# Patient Record
Sex: Female | Born: 1955
Health system: Southern US, Community
[De-identification: ages and names within clinical notes are randomized; demographics above are authoritative.]

## PROBLEM LIST (undated history)

## (undated) DIAGNOSIS — E785 Hyperlipidemia, unspecified: Secondary | ICD-10-CM

## (undated) DIAGNOSIS — F329 Major depressive disorder, single episode, unspecified: Secondary | ICD-10-CM

## (undated) DIAGNOSIS — R0600 Dyspnea, unspecified: Secondary | ICD-10-CM

## (undated) DIAGNOSIS — Z955 Presence of coronary angioplasty implant and graft: Secondary | ICD-10-CM

## (undated) DIAGNOSIS — I10 Essential (primary) hypertension: Secondary | ICD-10-CM

## (undated) DIAGNOSIS — J984 Other disorders of lung: Secondary | ICD-10-CM

## (undated) DIAGNOSIS — F1020 Alcohol dependence, uncomplicated: Secondary | ICD-10-CM

## (undated) DIAGNOSIS — G4700 Insomnia, unspecified: Secondary | ICD-10-CM

## (undated) DIAGNOSIS — C801 Malignant (primary) neoplasm, unspecified: Secondary | ICD-10-CM

## (undated) DIAGNOSIS — F32A Depression, unspecified: Secondary | ICD-10-CM

## (undated) HISTORY — DX: Alcohol dependence, uncomplicated: F10.20

## (undated) HISTORY — DX: Insomnia, unspecified: G47.00

## (undated) HISTORY — PX: CARDIAC CATHETERIZATION: SHX172

## (undated) HISTORY — PX: ORIF DISTAL RADIUS FRACTURE: SUR927

## (undated) HISTORY — DX: Depression, unspecified: F32.A

## (undated) HISTORY — DX: Dyspnea, unspecified: R06.00

## (undated) HISTORY — DX: Major depressive disorder, single episode, unspecified: F32.9

## (undated) HISTORY — DX: Hyperlipidemia, unspecified: E78.5

## (undated) HISTORY — PX: OTHER SURGICAL HISTORY: SHX169

## (undated) HISTORY — PX: MASTECTOMY: SHX3

---

## 1998-10-04 ENCOUNTER — Other Ambulatory Visit: Admission: RE | Admit: 1998-10-04 | Discharge: 1998-10-04 | Payer: Self-pay | Admitting: Obstetrics and Gynecology

## 1998-10-05 ENCOUNTER — Other Ambulatory Visit: Admission: RE | Admit: 1998-10-05 | Discharge: 1998-10-05 | Payer: Self-pay | Admitting: Obstetrics and Gynecology

## 1998-11-02 ENCOUNTER — Other Ambulatory Visit: Admission: RE | Admit: 1998-11-02 | Discharge: 1998-11-02 | Payer: Self-pay | Admitting: Obstetrics and Gynecology

## 1998-11-14 ENCOUNTER — Other Ambulatory Visit: Admission: RE | Admit: 1998-11-14 | Discharge: 1998-11-14 | Payer: Self-pay | Admitting: Obstetrics and Gynecology

## 1998-11-21 ENCOUNTER — Inpatient Hospital Stay (HOSPITAL_COMMUNITY): Admission: RE | Admit: 1998-11-21 | Discharge: 1998-11-23 | Payer: Self-pay | Admitting: Obstetrics and Gynecology

## 1999-10-06 ENCOUNTER — Other Ambulatory Visit: Admission: RE | Admit: 1999-10-06 | Discharge: 1999-10-06 | Payer: Self-pay | Admitting: Obstetrics and Gynecology

## 1999-11-15 ENCOUNTER — Encounter (INDEPENDENT_AMBULATORY_CARE_PROVIDER_SITE_OTHER): Payer: Self-pay | Admitting: *Deleted

## 1999-11-15 ENCOUNTER — Ambulatory Visit (HOSPITAL_COMMUNITY): Admission: RE | Admit: 1999-11-15 | Discharge: 1999-11-15 | Payer: Self-pay | Admitting: Gastroenterology

## 2000-02-07 ENCOUNTER — Ambulatory Visit (HOSPITAL_BASED_OUTPATIENT_CLINIC_OR_DEPARTMENT_OTHER): Admission: RE | Admit: 2000-02-07 | Discharge: 2000-02-07 | Payer: Self-pay | Admitting: Surgery

## 2000-06-18 ENCOUNTER — Ambulatory Visit (HOSPITAL_BASED_OUTPATIENT_CLINIC_OR_DEPARTMENT_OTHER): Admission: RE | Admit: 2000-06-18 | Discharge: 2000-06-18 | Payer: Self-pay | Admitting: Plastic Surgery

## 2000-11-06 ENCOUNTER — Other Ambulatory Visit: Admission: RE | Admit: 2000-11-06 | Discharge: 2000-11-06 | Payer: Self-pay | Admitting: Obstetrics and Gynecology

## 2002-01-27 ENCOUNTER — Other Ambulatory Visit: Admission: RE | Admit: 2002-01-27 | Discharge: 2002-01-27 | Payer: Self-pay | Admitting: Obstetrics and Gynecology

## 2004-02-25 ENCOUNTER — Emergency Department (HOSPITAL_COMMUNITY): Admission: EM | Admit: 2004-02-25 | Discharge: 2004-02-25 | Payer: Self-pay | Admitting: Family Medicine

## 2004-09-29 ENCOUNTER — Other Ambulatory Visit: Admission: RE | Admit: 2004-09-29 | Discharge: 2004-09-29 | Payer: Self-pay | Admitting: Obstetrics and Gynecology

## 2005-09-25 ENCOUNTER — Emergency Department (HOSPITAL_COMMUNITY): Admission: EM | Admit: 2005-09-25 | Discharge: 2005-09-25 | Payer: Self-pay | Admitting: Family Medicine

## 2005-10-17 ENCOUNTER — Other Ambulatory Visit: Admission: RE | Admit: 2005-10-17 | Discharge: 2005-10-17 | Payer: Self-pay | Admitting: Obstetrics and Gynecology

## 2005-12-13 ENCOUNTER — Ambulatory Visit (HOSPITAL_COMMUNITY): Admission: RE | Admit: 2005-12-13 | Discharge: 2005-12-13 | Payer: Self-pay | Admitting: Radiology

## 2005-12-20 ENCOUNTER — Ambulatory Visit: Payer: Self-pay | Admitting: Oncology

## 2005-12-26 ENCOUNTER — Encounter: Admission: RE | Admit: 2005-12-26 | Discharge: 2005-12-26 | Payer: Self-pay | Admitting: Radiology

## 2005-12-27 ENCOUNTER — Ambulatory Visit (HOSPITAL_COMMUNITY): Admission: RE | Admit: 2005-12-27 | Discharge: 2005-12-27 | Payer: Self-pay | Admitting: Oncology

## 2006-01-02 LAB — CBC WITH DIFFERENTIAL/PLATELET
Basophils Absolute: 0 10*3/uL (ref 0.0–0.1)
Eosinophils Absolute: 0.1 10*3/uL (ref 0.0–0.5)
HGB: 14.1 g/dL (ref 11.6–15.9)
MONO#: 0.6 10*3/uL (ref 0.1–0.9)
NEUT#: 3 10*3/uL (ref 1.5–6.5)
RBC: 4.4 10*6/uL (ref 3.70–5.32)
RDW: 13.2 % (ref 11.3–14.5)
WBC: 6.2 10*3/uL (ref 3.9–10.0)

## 2006-01-02 LAB — COMPREHENSIVE METABOLIC PANEL
ALT: 17 U/L (ref 0–40)
Albumin: 4.2 g/dL (ref 3.5–5.2)
CO2: 23 mEq/L (ref 19–32)
Calcium: 8.9 mg/dL (ref 8.4–10.5)
Chloride: 101 mEq/L (ref 96–112)
Glucose, Bld: 90 mg/dL (ref 70–99)
Potassium: 4.1 mEq/L (ref 3.5–5.3)
Sodium: 136 mEq/L (ref 135–145)
Total Bilirubin: 0.2 mg/dL — ABNORMAL LOW (ref 0.3–1.2)
Total Protein: 7.2 g/dL (ref 6.0–8.3)

## 2006-01-02 LAB — CANCER ANTIGEN 27.29: CA 27.29: 87 U/mL — ABNORMAL HIGH (ref 0–39)

## 2006-01-09 ENCOUNTER — Ambulatory Visit (HOSPITAL_COMMUNITY): Admission: RE | Admit: 2006-01-09 | Discharge: 2006-01-09 | Payer: Self-pay | Admitting: Oncology

## 2006-01-10 ENCOUNTER — Ambulatory Visit (HOSPITAL_BASED_OUTPATIENT_CLINIC_OR_DEPARTMENT_OTHER): Admission: RE | Admit: 2006-01-10 | Discharge: 2006-01-10 | Payer: Self-pay | Admitting: Surgery

## 2006-01-15 LAB — CBC WITH DIFFERENTIAL/PLATELET
Basophils Absolute: 0.1 10*3/uL (ref 0.0–0.1)
Eosinophils Absolute: 0.1 10*3/uL (ref 0.0–0.5)
HCT: 41.6 % (ref 34.8–46.6)
HGB: 14.6 g/dL (ref 11.6–15.9)
MCH: 32.2 pg (ref 26.0–34.0)
MCV: 92 fL (ref 81.0–101.0)
MONO%: 11.1 % (ref 0.0–13.0)
NEUT#: 2.6 10*3/uL (ref 1.5–6.5)
NEUT%: 46.2 % (ref 39.6–76.8)
RDW: 11.6 % (ref 11.3–14.5)

## 2006-01-22 LAB — CBC WITH DIFFERENTIAL/PLATELET
Basophils Absolute: 0.6 10*3/uL — ABNORMAL HIGH (ref 0.0–0.1)
EOS%: 0.1 % (ref 0.0–7.0)
Eosinophils Absolute: 0 10*3/uL (ref 0.0–0.5)
HGB: 14.3 g/dL (ref 11.6–15.9)
LYMPH%: 10.5 % — ABNORMAL LOW (ref 14.0–48.0)
MCH: 32 pg (ref 26.0–34.0)
MCV: 92.9 fL (ref 81.0–101.0)
MONO%: 1.6 % (ref 0.0–13.0)
NEUT#: 29.9 10*3/uL — ABNORMAL HIGH (ref 1.5–6.5)
Platelets: 255 10*3/uL (ref 145–400)
RBC: 4.47 10*6/uL (ref 3.70–5.32)

## 2006-02-04 ENCOUNTER — Ambulatory Visit: Payer: Self-pay | Admitting: Oncology

## 2006-02-05 LAB — COMPREHENSIVE METABOLIC PANEL
Albumin: 4.9 g/dL (ref 3.5–5.2)
Alkaline Phosphatase: 65 U/L (ref 39–117)
BUN: 13 mg/dL (ref 6–23)
Calcium: 9.4 mg/dL (ref 8.4–10.5)
Chloride: 104 mEq/L (ref 96–112)
Creatinine, Ser: 0.76 mg/dL (ref 0.40–1.20)
Glucose, Bld: 174 mg/dL — ABNORMAL HIGH (ref 70–99)
Potassium: 4.2 mEq/L (ref 3.5–5.3)

## 2006-02-05 LAB — CBC WITH DIFFERENTIAL/PLATELET
Basophils Absolute: 0 10*3/uL (ref 0.0–0.1)
EOS%: 0 % (ref 0.0–7.0)
Eosinophils Absolute: 0 10*3/uL (ref 0.0–0.5)
HCT: 41.9 % (ref 34.8–46.6)
HGB: 14.6 g/dL (ref 11.6–15.9)
MCH: 32.8 pg (ref 26.0–34.0)
MCV: 93.9 fL (ref 81.0–101.0)
MONO%: 0.3 % (ref 0.0–13.0)
NEUT#: 7.4 10*3/uL — ABNORMAL HIGH (ref 1.5–6.5)
NEUT%: 92.7 % — ABNORMAL HIGH (ref 39.6–76.8)
RDW: 13.1 % (ref 11.3–14.5)

## 2006-02-11 LAB — CBC WITH DIFFERENTIAL/PLATELET
Basophils Absolute: 0 10*3/uL (ref 0.0–0.1)
Eosinophils Absolute: 0 10*3/uL (ref 0.0–0.5)
HGB: 14.7 g/dL (ref 11.6–15.9)
MCV: 93.1 fL (ref 81.0–101.0)
MONO#: 0 10*3/uL — ABNORMAL LOW (ref 0.1–0.9)
MONO%: 1 % (ref 0.0–13.0)
NEUT#: 1.6 10*3/uL (ref 1.5–6.5)
Platelets: 318 10*3/uL (ref 145–400)
RBC: 4.49 10*6/uL (ref 3.70–5.32)
RDW: 13 % (ref 11.3–14.5)
WBC: 3.5 10*3/uL — ABNORMAL LOW (ref 3.9–10.0)

## 2006-02-26 LAB — COMPREHENSIVE METABOLIC PANEL
AST: 12 U/L (ref 0–37)
Albumin: 4.4 g/dL (ref 3.5–5.2)
BUN: 15 mg/dL (ref 6–23)
Calcium: 9.1 mg/dL (ref 8.4–10.5)
Chloride: 104 mEq/L (ref 96–112)
Creatinine, Ser: 0.68 mg/dL (ref 0.40–1.20)
Glucose, Bld: 201 mg/dL — ABNORMAL HIGH (ref 70–99)
Potassium: 4.2 mEq/L (ref 3.5–5.3)

## 2006-02-26 LAB — CBC WITH DIFFERENTIAL/PLATELET
Basophils Absolute: 0 10*3/uL (ref 0.0–0.1)
EOS%: 0.1 % (ref 0.0–7.0)
Eosinophils Absolute: 0 10*3/uL (ref 0.0–0.5)
HCT: 41.5 % (ref 34.8–46.6)
HGB: 14.7 g/dL (ref 11.6–15.9)
MCH: 33.3 pg (ref 26.0–34.0)
MCV: 93.8 fL (ref 81.0–101.0)
NEUT#: 10.8 10*3/uL — ABNORMAL HIGH (ref 1.5–6.5)
NEUT%: 87.7 % — ABNORMAL HIGH (ref 39.6–76.8)
RDW: 14.4 % (ref 11.3–14.5)
lymph#: 1.4 10*3/uL (ref 0.9–3.3)

## 2006-03-05 LAB — CBC WITH DIFFERENTIAL/PLATELET
Basophils Absolute: 0 10*3/uL (ref 0.0–0.1)
EOS%: 0.6 % (ref 0.0–7.0)
Eosinophils Absolute: 0 10*3/uL (ref 0.0–0.5)
HCT: 38.3 % (ref 34.8–46.6)
HGB: 13.6 g/dL (ref 11.6–15.9)
MCH: 33 pg (ref 26.0–34.0)
MONO#: 0.2 10*3/uL (ref 0.1–0.9)
NEUT#: 1.1 10*3/uL — ABNORMAL LOW (ref 1.5–6.5)
NEUT%: 37.7 % — ABNORMAL LOW (ref 39.6–76.8)
RDW: 14.4 % (ref 11.3–14.5)
WBC: 2.8 10*3/uL — ABNORMAL LOW (ref 3.9–10.0)
lymph#: 1.5 10*3/uL (ref 0.9–3.3)

## 2006-03-08 ENCOUNTER — Encounter: Admission: RE | Admit: 2006-03-08 | Discharge: 2006-03-08 | Payer: Self-pay | Admitting: Pediatrics

## 2006-03-12 ENCOUNTER — Ambulatory Visit (HOSPITAL_COMMUNITY): Admission: RE | Admit: 2006-03-12 | Discharge: 2006-03-12 | Payer: Self-pay | Admitting: Oncology

## 2006-03-14 ENCOUNTER — Ambulatory Visit: Payer: Self-pay | Admitting: Oncology

## 2006-03-25 LAB — COMPREHENSIVE METABOLIC PANEL
ALT: 23 U/L (ref 0–40)
AST: 16 U/L (ref 0–37)
CO2: 21 mEq/L (ref 19–32)
Calcium: 9.9 mg/dL (ref 8.4–10.5)
Chloride: 101 mEq/L (ref 96–112)
Potassium: 4.1 mEq/L (ref 3.5–5.3)
Sodium: 139 mEq/L (ref 135–145)
Total Protein: 7.4 g/dL (ref 6.0–8.3)

## 2006-03-25 LAB — CBC WITH DIFFERENTIAL/PLATELET
BASO%: 0.1 % (ref 0.0–2.0)
HCT: 41.9 % (ref 34.8–46.6)
MCHC: 35.9 g/dL (ref 32.0–36.0)
MONO#: 0.1 10*3/uL (ref 0.1–0.9)
RBC: 4.48 10*6/uL (ref 3.70–5.32)
RDW: 14.3 % (ref 11.3–14.5)
WBC: 9.2 10*3/uL (ref 3.9–10.0)
lymph#: 0.8 10*3/uL — ABNORMAL LOW (ref 0.9–3.3)

## 2006-03-25 LAB — CANCER ANTIGEN 27.29: CA 27.29: 74 U/mL — ABNORMAL HIGH (ref 0–39)

## 2006-04-02 LAB — CBC WITH DIFFERENTIAL/PLATELET
Basophils Absolute: 0.2 10*3/uL — ABNORMAL HIGH (ref 0.0–0.1)
EOS%: 0.1 % (ref 0.0–7.0)
Eosinophils Absolute: 0 10*3/uL (ref 0.0–0.5)
HCT: 39 % (ref 34.8–46.6)
HGB: 13.9 g/dL (ref 11.6–15.9)
LYMPH%: 18.5 % (ref 14.0–48.0)
MCH: 33.8 pg (ref 26.0–34.0)
MCV: 94.5 fL (ref 81.0–101.0)
MONO%: 10.1 % (ref 0.0–13.0)
NEUT#: 13.2 10*3/uL — ABNORMAL HIGH (ref 1.5–6.5)
NEUT%: 70.3 % (ref 39.6–76.8)
Platelets: 236 10*3/uL (ref 145–400)

## 2006-04-13 ENCOUNTER — Ambulatory Visit: Payer: Self-pay | Admitting: Oncology

## 2006-04-16 LAB — CBC WITH DIFFERENTIAL/PLATELET
EOS%: 0.1 % (ref 0.0–7.0)
Eosinophils Absolute: 0 10*3/uL (ref 0.0–0.5)
LYMPH%: 9 % — ABNORMAL LOW (ref 14.0–48.0)
MCH: 34.4 pg — ABNORMAL HIGH (ref 26.0–34.0)
MCV: 96.6 fL (ref 81.0–101.0)
MONO%: 1.3 % (ref 0.0–13.0)
Platelets: 369 10*3/uL (ref 145–400)
RBC: 3.88 10*6/uL (ref 3.70–5.32)
RDW: 15.2 % — ABNORMAL HIGH (ref 11.3–14.5)

## 2006-04-16 LAB — COMPREHENSIVE METABOLIC PANEL
AST: 15 U/L (ref 0–37)
Albumin: 4.7 g/dL (ref 3.5–5.2)
Alkaline Phosphatase: 75 U/L (ref 39–117)
BUN: 9 mg/dL (ref 6–23)
Glucose, Bld: 183 mg/dL — ABNORMAL HIGH (ref 70–99)
Potassium: 4.2 mEq/L (ref 3.5–5.3)
Sodium: 140 mEq/L (ref 135–145)
Total Bilirubin: 0.4 mg/dL (ref 0.3–1.2)
Total Protein: 7.2 g/dL (ref 6.0–8.3)

## 2006-04-16 LAB — LACTATE DEHYDROGENASE: LDH: 176 U/L (ref 94–250)

## 2006-04-23 LAB — CBC WITH DIFFERENTIAL/PLATELET
Basophils Absolute: 0 10*3/uL (ref 0.0–0.1)
EOS%: 0.2 % (ref 0.0–7.0)
Eosinophils Absolute: 0 10*3/uL (ref 0.0–0.5)
HCT: 37 % (ref 34.8–46.6)
HGB: 12.8 g/dL (ref 11.6–15.9)
MCH: 34.1 pg — ABNORMAL HIGH (ref 26.0–34.0)
MONO#: 0.5 10*3/uL (ref 0.1–0.9)
NEUT#: 1.4 10*3/uL — ABNORMAL LOW (ref 1.5–6.5)
NEUT%: 37.4 % — ABNORMAL LOW (ref 39.6–76.8)
RDW: 15.6 % — ABNORMAL HIGH (ref 11.3–14.5)
lymph#: 1.8 10*3/uL (ref 0.9–3.3)

## 2006-05-07 LAB — COMPREHENSIVE METABOLIC PANEL
AST: 16 U/L (ref 0–37)
Albumin: 4.2 g/dL (ref 3.5–5.2)
Alkaline Phosphatase: 51 U/L (ref 39–117)
BUN: 12 mg/dL (ref 6–23)
Calcium: 8.9 mg/dL (ref 8.4–10.5)
Creatinine, Ser: 0.68 mg/dL (ref 0.40–1.20)
Glucose, Bld: 120 mg/dL — ABNORMAL HIGH (ref 70–99)
Potassium: 3.8 mEq/L (ref 3.5–5.3)

## 2006-05-07 LAB — CBC WITH DIFFERENTIAL/PLATELET
Basophils Absolute: 0 10*3/uL (ref 0.0–0.1)
EOS%: 0.1 % (ref 0.0–7.0)
Eosinophils Absolute: 0 10*3/uL (ref 0.0–0.5)
HCT: 35.1 % (ref 34.8–46.6)
HGB: 12.2 g/dL (ref 11.6–15.9)
MCH: 34.6 pg — ABNORMAL HIGH (ref 26.0–34.0)
MCV: 99.8 fL (ref 81.0–101.0)
MONO%: 9.6 % (ref 0.0–13.0)
NEUT#: 3.6 10*3/uL (ref 1.5–6.5)
NEUT%: 67.7 % (ref 39.6–76.8)
Platelets: 265 10*3/uL (ref 145–400)

## 2006-05-14 ENCOUNTER — Encounter: Admission: RE | Admit: 2006-05-14 | Discharge: 2006-05-14 | Payer: Self-pay | Admitting: Oncology

## 2006-05-16 ENCOUNTER — Ambulatory Visit (HOSPITAL_COMMUNITY): Admission: RE | Admit: 2006-05-16 | Discharge: 2006-05-16 | Payer: Self-pay | Admitting: Oncology

## 2006-05-23 LAB — CBC WITH DIFFERENTIAL/PLATELET
BASO%: 0.4 % (ref 0.0–2.0)
HCT: 36.1 % (ref 34.8–46.6)
MCHC: 34.5 g/dL (ref 32.0–36.0)
MONO#: 0.8 10*3/uL (ref 0.1–0.9)
NEUT%: 57.5 % (ref 39.6–76.8)
RBC: 3.51 10*6/uL — ABNORMAL LOW (ref 3.70–5.32)
RDW: 15.9 % — ABNORMAL HIGH (ref 11.3–14.5)
WBC: 5.6 10*3/uL (ref 3.9–10.0)
lymph#: 1.6 10*3/uL (ref 0.9–3.3)

## 2006-05-31 ENCOUNTER — Ambulatory Visit: Payer: Self-pay | Admitting: Oncology

## 2006-06-04 LAB — COMPREHENSIVE METABOLIC PANEL
ALT: 25 U/L (ref 0–40)
AST: 14 U/L (ref 0–37)
Alkaline Phosphatase: 46 U/L (ref 39–117)
Sodium: 138 mEq/L (ref 135–145)
Total Bilirubin: 0.5 mg/dL (ref 0.3–1.2)
Total Protein: 6.8 g/dL (ref 6.0–8.3)

## 2006-06-04 LAB — CBC WITH DIFFERENTIAL/PLATELET
Eosinophils Absolute: 0 10*3/uL (ref 0.0–0.5)
LYMPH%: 5.9 % — ABNORMAL LOW (ref 14.0–48.0)
MCHC: 35.1 g/dL (ref 32.0–36.0)
MCV: 99.8 fL (ref 81.0–101.0)
MONO%: 1.1 % (ref 0.0–13.0)
NEUT#: 10.5 10*3/uL — ABNORMAL HIGH (ref 1.5–6.5)
Platelets: 309 10*3/uL (ref 145–400)
RBC: 4.19 10*6/uL (ref 3.70–5.32)

## 2006-06-11 LAB — CBC WITH DIFFERENTIAL/PLATELET
BASO%: 0.9 % (ref 0.0–2.0)
EOS%: 1.3 % (ref 0.0–7.0)
LYMPH%: 55.4 % — ABNORMAL HIGH (ref 14.0–48.0)
MCHC: 35.5 g/dL (ref 32.0–36.0)
MONO#: 0.1 10*3/uL (ref 0.1–0.9)
MONO%: 4 % (ref 0.0–13.0)
Platelets: 188 10*3/uL (ref 145–400)
RBC: 3.83 10*6/uL (ref 3.70–5.32)
WBC: 2.8 10*3/uL — ABNORMAL LOW (ref 3.9–10.0)

## 2006-06-13 ENCOUNTER — Ambulatory Visit: Payer: Self-pay | Admitting: Pulmonary Disease

## 2006-06-18 LAB — CBC WITH DIFFERENTIAL/PLATELET
BASO%: 1.2 % (ref 0.0–2.0)
EOS%: 0.5 % (ref 0.0–7.0)
HCT: 32.6 % — ABNORMAL LOW (ref 34.8–46.6)
MCH: 35.8 pg — ABNORMAL HIGH (ref 26.0–34.0)
MCHC: 36.1 g/dL — ABNORMAL HIGH (ref 32.0–36.0)
MONO#: 0.1 10*3/uL (ref 0.1–0.9)
NEUT%: 25.2 % — ABNORMAL LOW (ref 39.6–76.8)
RBC: 3.29 10*6/uL — ABNORMAL LOW (ref 3.70–5.32)
RDW: 12 % (ref 11.3–14.5)
WBC: 1.7 10*3/uL — ABNORMAL LOW (ref 3.9–10.0)
lymph#: 1.1 10*3/uL (ref 0.9–3.3)

## 2006-06-21 ENCOUNTER — Encounter: Payer: Self-pay | Admitting: Pulmonary Disease

## 2006-06-21 ENCOUNTER — Ambulatory Visit (HOSPITAL_COMMUNITY): Admission: RE | Admit: 2006-06-21 | Discharge: 2006-06-21 | Payer: Self-pay | Admitting: Pulmonary Disease

## 2006-06-21 ENCOUNTER — Ambulatory Visit: Admission: RE | Admit: 2006-06-21 | Discharge: 2006-06-21 | Payer: Self-pay | Admitting: Pulmonary Disease

## 2006-06-25 LAB — COMPREHENSIVE METABOLIC PANEL
ALT: 48 U/L — ABNORMAL HIGH (ref 0–35)
AST: 33 U/L (ref 0–37)
Albumin: 4.2 g/dL (ref 3.5–5.2)
BUN: 6 mg/dL (ref 6–23)
CO2: 25 mEq/L (ref 19–32)
Calcium: 9.3 mg/dL (ref 8.4–10.5)
Chloride: 106 mEq/L (ref 96–112)
Potassium: 3.7 mEq/L (ref 3.5–5.3)

## 2006-06-25 LAB — CBC WITH DIFFERENTIAL/PLATELET
BASO%: 1.3 % (ref 0.0–2.0)
Basophils Absolute: 0.9 10*3/uL — ABNORMAL HIGH (ref 0.0–0.1)
EOS%: 0 % (ref 0.0–7.0)
HCT: 36.5 % (ref 34.8–46.6)
HGB: 12.6 g/dL (ref 11.6–15.9)
MCH: 34.9 pg — ABNORMAL HIGH (ref 26.0–34.0)
MONO#: 4.8 10*3/uL — ABNORMAL HIGH (ref 0.1–0.9)
NEUT#: 57.1 10*3/uL — ABNORMAL HIGH (ref 1.5–6.5)
NEUT%: 84.6 % — ABNORMAL HIGH (ref 39.6–76.8)
RDW: 15.1 % — ABNORMAL HIGH (ref 11.3–14.5)
WBC: 67.5 10*3/uL (ref 3.9–10.0)
lymph#: 4.7 10*3/uL — ABNORMAL HIGH (ref 0.9–3.3)

## 2006-06-25 LAB — CANCER ANTIGEN 27.29: CA 27.29: 60 U/mL — ABNORMAL HIGH (ref 0–39)

## 2006-06-27 ENCOUNTER — Ambulatory Visit (HOSPITAL_COMMUNITY): Admission: RE | Admit: 2006-06-27 | Discharge: 2006-06-27 | Payer: Self-pay | Admitting: Oncology

## 2006-06-27 ENCOUNTER — Ambulatory Visit: Payer: Self-pay | Admitting: Pulmonary Disease

## 2006-06-27 LAB — CBC WITH DIFFERENTIAL/PLATELET
BASO%: 0.3 % (ref 0.0–2.0)
EOS%: 0 % (ref 0.0–7.0)
HCT: 35.8 % (ref 34.8–46.6)
LYMPH%: 29 % (ref 14.0–48.0)
MCH: 35.6 pg — ABNORMAL HIGH (ref 26.0–34.0)
MCHC: 34.4 g/dL (ref 32.0–36.0)
MCV: 103.6 fL — ABNORMAL HIGH (ref 81.0–101.0)
MONO#: 0.3 10*3/uL (ref 0.1–0.9)
MONO%: 9.7 % (ref 0.0–13.0)
NEUT%: 61 % (ref 39.6–76.8)
Platelets: 366 10*3/uL (ref 145–400)
RBC: 3.45 10*6/uL — ABNORMAL LOW (ref 3.70–5.32)

## 2006-06-27 LAB — URINALYSIS, MICROSCOPIC - CHCC
Glucose: NEGATIVE g/dL
Leukocyte Esterase: NEGATIVE
Nitrite: NEGATIVE
Protein: NEGATIVE mg/dL
Specific Gravity, Urine: 1.02 (ref 1.003–1.035)

## 2006-06-29 LAB — URINE CULTURE

## 2006-07-02 LAB — CBC WITH DIFFERENTIAL/PLATELET
BASO%: 0.9 % (ref 0.0–2.0)
Basophils Absolute: 0 10*3/uL (ref 0.0–0.1)
EOS%: 0.1 % (ref 0.0–7.0)
Eosinophils Absolute: 0 10*3/uL (ref 0.0–0.5)
HCT: 37.2 % (ref 34.8–46.6)
HGB: 13.2 g/dL (ref 11.6–15.9)
LYMPH%: 62.4 % — ABNORMAL HIGH (ref 14.0–48.0)
MCH: 35.1 pg — ABNORMAL HIGH (ref 26.0–34.0)
MCHC: 35.5 g/dL (ref 32.0–36.0)
MCV: 99 fL (ref 81.0–101.0)
MONO#: 0.4 10*3/uL (ref 0.1–0.9)
MONO%: 13.9 % — ABNORMAL HIGH (ref 0.0–13.0)
NEUT#: 0.6 10*3/uL — ABNORMAL LOW (ref 1.5–6.5)
NEUT%: 22.7 % — ABNORMAL LOW (ref 39.6–76.8)
Platelets: 171 10*3/uL (ref 145–400)
RBC: 3.76 10*6/uL (ref 3.70–5.32)
RDW: 12.7 % (ref 11.3–14.5)
WBC: 2.7 10*3/uL — ABNORMAL LOW (ref 3.9–10.0)
lymph#: 1.7 10*3/uL (ref 0.9–3.3)

## 2006-07-09 LAB — CBC WITH DIFFERENTIAL/PLATELET
Basophils Absolute: 0 10*3/uL (ref 0.0–0.1)
Eosinophils Absolute: 0 10*3/uL (ref 0.0–0.5)
HGB: 14.4 g/dL (ref 11.6–15.9)
LYMPH%: 10.7 % — ABNORMAL LOW (ref 14.0–48.0)
MCV: 101 fL (ref 81.0–101.0)
MONO%: 3.4 % (ref 0.0–13.0)
NEUT#: 4.1 10*3/uL (ref 1.5–6.5)
Platelets: 390 10*3/uL (ref 145–400)
RDW: 13.8 % (ref 11.3–14.5)

## 2006-07-09 LAB — COMPREHENSIVE METABOLIC PANEL
Albumin: 4.5 g/dL (ref 3.5–5.2)
Alkaline Phosphatase: 71 U/L (ref 39–117)
BUN: 12 mg/dL (ref 6–23)
CO2: 21 mEq/L (ref 19–32)
Glucose, Bld: 139 mg/dL — ABNORMAL HIGH (ref 70–99)
Potassium: 4.1 mEq/L (ref 3.5–5.3)

## 2006-07-09 LAB — CANCER ANTIGEN 27.29: CA 27.29: 73 U/mL — ABNORMAL HIGH (ref 0–39)

## 2006-07-09 LAB — LACTATE DEHYDROGENASE: LDH: 181 U/L (ref 94–250)

## 2006-07-16 ENCOUNTER — Ambulatory Visit: Payer: Self-pay | Admitting: Oncology

## 2006-07-16 LAB — CBC WITH DIFFERENTIAL/PLATELET
Basophils Absolute: 0.1 10*3/uL (ref 0.0–0.1)
Eosinophils Absolute: 0.1 10*3/uL (ref 0.0–0.5)
HCT: 37.7 % (ref 34.8–46.6)
HGB: 13 g/dL (ref 11.6–15.9)
LYMPH%: 12.3 % — ABNORMAL LOW (ref 14.0–48.0)
MCHC: 34.5 g/dL (ref 32.0–36.0)
MONO#: 1.4 10*3/uL — ABNORMAL HIGH (ref 0.1–0.9)
NEUT#: 28.1 10*3/uL — ABNORMAL HIGH (ref 1.5–6.5)
NEUT%: 82.8 % — ABNORMAL HIGH (ref 39.6–76.8)
Platelets: 295 10*3/uL (ref 145–400)
WBC: 33.9 10*3/uL — ABNORMAL HIGH (ref 3.9–10.0)
lymph#: 4.2 10*3/uL — ABNORMAL HIGH (ref 0.9–3.3)

## 2006-07-23 LAB — CBC WITH DIFFERENTIAL/PLATELET
BASO%: 0.3 % (ref 0.0–2.0)
Basophils Absolute: 0.1 10*3/uL (ref 0.0–0.1)
EOS%: 0.2 % (ref 0.0–7.0)
HCT: 34.9 % (ref 34.8–46.6)
HGB: 12.3 g/dL (ref 11.6–15.9)
LYMPH%: 8.2 % — ABNORMAL LOW (ref 14.0–48.0)
MCH: 35.6 pg — ABNORMAL HIGH (ref 26.0–34.0)
MCHC: 35.1 g/dL (ref 32.0–36.0)
MCV: 101 fL (ref 81.0–101.0)
NEUT%: 87.1 % — ABNORMAL HIGH (ref 39.6–76.8)
Platelets: 100 10*3/uL — ABNORMAL LOW (ref 145–400)
lymph#: 2.5 10*3/uL (ref 0.9–3.3)

## 2006-08-07 LAB — CBC WITH DIFFERENTIAL/PLATELET
BASO%: 1.3 % (ref 0.0–2.0)
Basophils Absolute: 0.1 10*3/uL (ref 0.0–0.1)
EOS%: 0.8 % (ref 0.0–7.0)
HGB: 14.2 g/dL (ref 11.6–15.9)
MCH: 34.9 pg — ABNORMAL HIGH (ref 26.0–34.0)
MCHC: 35 g/dL (ref 32.0–36.0)
MCV: 99.7 fL (ref 81.0–101.0)
MONO%: 14 % — ABNORMAL HIGH (ref 0.0–13.0)
NEUT%: 45.2 % (ref 39.6–76.8)
RDW: 12.2 % (ref 11.3–14.5)
lymph#: 2.3 10*3/uL (ref 0.9–3.3)

## 2006-08-07 LAB — COMPREHENSIVE METABOLIC PANEL
ALT: 39 U/L — ABNORMAL HIGH (ref 0–35)
AST: 25 U/L (ref 0–37)
Alkaline Phosphatase: 71 U/L (ref 39–117)
BUN: 11 mg/dL (ref 6–23)
Creatinine, Ser: 0.7 mg/dL (ref 0.40–1.20)

## 2006-08-16 LAB — CBC WITH DIFFERENTIAL/PLATELET
BASO%: 0.4 % (ref 0.0–2.0)
EOS%: 0.4 % (ref 0.0–7.0)
Eosinophils Absolute: 0.1 10*3/uL (ref 0.0–0.5)
MCV: 98.1 fL (ref 81.0–101.0)
MONO%: 8.5 % (ref 0.0–13.0)
NEUT#: 27.5 10*3/uL — ABNORMAL HIGH (ref 1.5–6.5)
RBC: 3.87 10*6/uL (ref 3.70–5.32)
RDW: 12.3 % (ref 11.3–14.5)

## 2006-08-23 ENCOUNTER — Ambulatory Visit: Payer: Self-pay | Admitting: Oncology

## 2006-08-23 LAB — COMPREHENSIVE METABOLIC PANEL
ALT: 114 U/L — ABNORMAL HIGH (ref 0–35)
Albumin: 3.8 g/dL (ref 3.5–5.2)
CO2: 26 mEq/L (ref 19–32)
Chloride: 103 mEq/L (ref 96–112)
Glucose, Bld: 128 mg/dL — ABNORMAL HIGH (ref 70–99)
Potassium: 3.3 mEq/L — ABNORMAL LOW (ref 3.5–5.3)
Sodium: 137 mEq/L (ref 135–145)
Total Protein: 6.1 g/dL (ref 6.0–8.3)

## 2006-08-23 LAB — CBC WITH DIFFERENTIAL/PLATELET
Eosinophils Absolute: 0.1 10*3/uL (ref 0.0–0.5)
MONO#: 1 10*3/uL — ABNORMAL HIGH (ref 0.1–0.9)
NEUT#: 18.9 10*3/uL — ABNORMAL HIGH (ref 1.5–6.5)
Platelets: 103 10*3/uL — ABNORMAL LOW (ref 145–400)
RBC: 3.56 10*6/uL — ABNORMAL LOW (ref 3.70–5.32)
RDW: 11.7 % (ref 11.3–14.5)
WBC: 23.5 10*3/uL — ABNORMAL HIGH (ref 3.9–10.0)
lymph#: 3.4 10*3/uL — ABNORMAL HIGH (ref 0.9–3.3)

## 2006-08-23 LAB — LACTATE DEHYDROGENASE: LDH: 256 U/L — ABNORMAL HIGH (ref 94–250)

## 2006-08-30 ENCOUNTER — Ambulatory Visit (HOSPITAL_COMMUNITY): Admission: RE | Admit: 2006-08-30 | Discharge: 2006-08-30 | Payer: Self-pay | Admitting: Oncology

## 2006-08-30 LAB — COMPREHENSIVE METABOLIC PANEL
ALT: 144 U/L — ABNORMAL HIGH (ref 0–35)
Albumin: 4.1 g/dL (ref 3.5–5.2)
Alkaline Phosphatase: 71 U/L (ref 39–117)
Glucose, Bld: 93 mg/dL (ref 70–99)
Potassium: 3.7 mEq/L (ref 3.5–5.3)
Sodium: 141 mEq/L (ref 135–145)
Total Bilirubin: 0.4 mg/dL (ref 0.3–1.2)
Total Protein: 6.2 g/dL (ref 6.0–8.3)

## 2006-08-30 LAB — CBC WITH DIFFERENTIAL/PLATELET
Basophils Absolute: 0 10*3/uL (ref 0.0–0.1)
EOS%: 1.3 % (ref 0.0–7.0)
Eosinophils Absolute: 0 10*3/uL (ref 0.0–0.5)
HGB: 12.3 g/dL (ref 11.6–15.9)
MCH: 34.9 pg — ABNORMAL HIGH (ref 26.0–34.0)
NEUT#: 1.1 10*3/uL — ABNORMAL LOW (ref 1.5–6.5)
RBC: 3.54 10*6/uL — ABNORMAL LOW (ref 3.70–5.32)
RDW: 12.4 % (ref 11.3–14.5)
lymph#: 1 10*3/uL (ref 0.9–3.3)

## 2006-08-30 LAB — CANCER ANTIGEN 27.29: CA 27.29: 53 U/mL — ABNORMAL HIGH (ref 0–39)

## 2006-09-06 ENCOUNTER — Ambulatory Visit: Admission: RE | Admit: 2006-09-06 | Discharge: 2006-12-05 | Payer: Self-pay | Admitting: Radiation Oncology

## 2006-09-16 LAB — FSH/LH: FSH: 58 m[IU]/mL

## 2006-09-16 LAB — CMP AND LIVER
ALT: 50 U/L — ABNORMAL HIGH (ref 0–35)
Alkaline Phosphatase: 60 U/L (ref 39–117)
Bilirubin, Direct: 0.1 mg/dL (ref 0.0–0.3)
CO2: 28 mEq/L (ref 19–32)
Creatinine, Ser: 0.66 mg/dL (ref 0.40–1.20)
Indirect Bilirubin: 0.3 mg/dL (ref 0.0–0.9)
Total Bilirubin: 0.4 mg/dL (ref 0.3–1.2)

## 2006-09-16 LAB — CBC WITH DIFFERENTIAL/PLATELET
BASO%: 0.5 % (ref 0.0–2.0)
EOS%: 0.3 % (ref 0.0–7.0)
HCT: 42.2 % (ref 34.8–46.6)
LYMPH%: 28.8 % (ref 14.0–48.0)
MCH: 34.8 pg — ABNORMAL HIGH (ref 26.0–34.0)
MCHC: 35.2 g/dL (ref 32.0–36.0)
MCV: 99 fL (ref 81.0–101.0)
MONO%: 15.1 % — ABNORMAL HIGH (ref 0.0–13.0)
NEUT%: 55.3 % (ref 39.6–76.8)
Platelets: 337 10*3/uL (ref 145–400)
RBC: 4.27 10*6/uL (ref 3.70–5.32)
WBC: 5.5 10*3/uL (ref 3.9–10.0)

## 2006-09-24 ENCOUNTER — Ambulatory Visit: Payer: Self-pay | Admitting: Pulmonary Disease

## 2006-09-30 LAB — ESTRADIOL, ULTRA SENS: Estradiol, Ultra Sensitive: <2 pg/mL

## 2006-10-21 ENCOUNTER — Inpatient Hospital Stay (HOSPITAL_COMMUNITY): Admission: RE | Admit: 2006-10-21 | Discharge: 2006-10-22 | Payer: Self-pay | Admitting: Surgery

## 2006-10-21 ENCOUNTER — Encounter (INDEPENDENT_AMBULATORY_CARE_PROVIDER_SITE_OTHER): Payer: Self-pay | Admitting: Specialist

## 2006-11-21 ENCOUNTER — Ambulatory Visit: Payer: Self-pay | Admitting: Oncology

## 2006-11-26 LAB — CANCER ANTIGEN 27.29: CA 27.29: 40 U/mL — ABNORMAL HIGH (ref 0–39)

## 2006-11-26 LAB — CBC WITH DIFFERENTIAL/PLATELET
Basophils Absolute: 0 10*3/uL (ref 0.0–0.1)
Eosinophils Absolute: 0 10*3/uL (ref 0.0–0.5)
HGB: 14.5 g/dL (ref 11.6–15.9)
MCV: 92.9 fL (ref 81.0–101.0)
NEUT#: 3.6 10*3/uL (ref 1.5–6.5)
RDW: 13.1 % (ref 11.3–14.5)
lymph#: 1.1 10*3/uL (ref 0.9–3.3)

## 2006-11-26 LAB — COMPREHENSIVE METABOLIC PANEL
Albumin: 4.2 g/dL (ref 3.5–5.2)
BUN: 8 mg/dL (ref 6–23)
Calcium: 9.4 mg/dL (ref 8.4–10.5)
Chloride: 102 mEq/L (ref 96–112)
Glucose, Bld: 153 mg/dL — ABNORMAL HIGH (ref 70–99)
Potassium: 4 mEq/L (ref 3.5–5.3)

## 2006-12-02 ENCOUNTER — Encounter: Payer: Self-pay | Admitting: Pulmonary Disease

## 2006-12-02 ENCOUNTER — Ambulatory Visit: Payer: Self-pay | Admitting: Pulmonary Disease

## 2006-12-06 ENCOUNTER — Ambulatory Visit: Admission: RE | Admit: 2006-12-06 | Discharge: 2007-03-05 | Payer: Self-pay | Admitting: Radiation Oncology

## 2007-01-16 ENCOUNTER — Ambulatory Visit: Payer: Self-pay | Admitting: Oncology

## 2007-02-24 LAB — CBC WITH DIFFERENTIAL/PLATELET
BASO%: 0.4 % (ref 0.0–2.0)
Eosinophils Absolute: 0.2 10*3/uL (ref 0.0–0.5)
HCT: 37.9 % (ref 34.8–46.6)
MCHC: 35.2 g/dL (ref 32.0–36.0)
MONO#: 0.6 10*3/uL (ref 0.1–0.9)
NEUT#: 2.4 10*3/uL (ref 1.5–6.5)
Platelets: 227 10*3/uL (ref 145–400)
RBC: 4.06 10*6/uL (ref 3.70–5.32)
WBC: 4.2 10*3/uL (ref 3.9–10.0)
lymph#: 1 10*3/uL (ref 0.9–3.3)

## 2007-02-24 LAB — COMPREHENSIVE METABOLIC PANEL
ALT: 29 U/L (ref 0–35)
Albumin: 3.8 g/dL (ref 3.5–5.2)
CO2: 27 mEq/L (ref 19–32)
Calcium: 9.1 mg/dL (ref 8.4–10.5)
Chloride: 103 mEq/L (ref 96–112)
Glucose, Bld: 103 mg/dL — ABNORMAL HIGH (ref 70–99)
Sodium: 140 mEq/L (ref 135–145)
Total Protein: 6.5 g/dL (ref 6.0–8.3)

## 2007-02-24 LAB — CANCER ANTIGEN 27.29: CA 27.29: 34 U/mL (ref 0–39)

## 2007-02-24 LAB — LACTATE DEHYDROGENASE: LDH: 131 U/L (ref 94–250)

## 2007-02-27 LAB — WOUND CULTURE

## 2007-03-10 ENCOUNTER — Ambulatory Visit (HOSPITAL_COMMUNITY): Admission: RE | Admit: 2007-03-10 | Discharge: 2007-03-10 | Payer: Self-pay | Admitting: Oncology

## 2007-03-20 ENCOUNTER — Ambulatory Visit: Payer: Self-pay | Admitting: Oncology

## 2007-03-20 ENCOUNTER — Ambulatory Visit (HOSPITAL_COMMUNITY): Admission: RE | Admit: 2007-03-20 | Discharge: 2007-03-20 | Payer: Self-pay | Admitting: Oncology

## 2007-03-20 LAB — CBC WITH DIFFERENTIAL/PLATELET
Basophils Absolute: 0 10*3/uL (ref 0.0–0.1)
EOS%: 0.4 % (ref 0.0–7.0)
Eosinophils Absolute: 0 10*3/uL (ref 0.0–0.5)
HCT: 42.2 % (ref 34.8–46.6)
HGB: 14.9 g/dL (ref 11.6–15.9)
LYMPH%: 21.2 % (ref 14.0–48.0)
MCH: 33 pg (ref 26.0–34.0)
MCV: 93.3 fL (ref 81.0–101.0)
MONO%: 8.2 % (ref 0.0–13.0)
NEUT#: 5 10*3/uL (ref 1.5–6.5)
NEUT%: 69.9 % (ref 39.6–76.8)
Platelets: 264 10*3/uL (ref 145–400)

## 2007-06-03 ENCOUNTER — Ambulatory Visit: Payer: Self-pay | Admitting: Oncology

## 2007-06-04 LAB — CBC WITH DIFFERENTIAL/PLATELET
BASO%: 0.4 % (ref 0.0–2.0)
EOS%: 0.9 % (ref 0.0–7.0)
HCT: 42.8 % (ref 34.8–46.6)
LYMPH%: 20.6 % (ref 14.0–48.0)
MCH: 34.8 pg — ABNORMAL HIGH (ref 26.0–34.0)
MCHC: 35.7 g/dL (ref 32.0–36.0)
MONO#: 0.5 10*3/uL (ref 0.1–0.9)
NEUT%: 69.6 % (ref 39.6–76.8)
RBC: 4.39 10*6/uL (ref 3.70–5.32)
WBC: 5.8 10*3/uL (ref 3.9–10.0)
lymph#: 1.2 10*3/uL (ref 0.9–3.3)

## 2007-06-05 LAB — COMPREHENSIVE METABOLIC PANEL
AST: 107 U/L — ABNORMAL HIGH (ref 0–37)
Alkaline Phosphatase: 89 U/L (ref 39–117)
BUN: 14 mg/dL (ref 6–23)
Glucose, Bld: 104 mg/dL — ABNORMAL HIGH (ref 70–99)
Total Bilirubin: 0.6 mg/dL (ref 0.3–1.2)

## 2007-06-05 LAB — CANCER ANTIGEN 27.29: CA 27.29: 36 U/mL (ref 0–39)

## 2007-06-12 LAB — HEPATIC FUNCTION PANEL
ALT: 132 U/L — ABNORMAL HIGH (ref 0–35)
AST: 83 U/L — ABNORMAL HIGH (ref 0–37)
Albumin: 4.1 g/dL (ref 3.5–5.2)
Alkaline Phosphatase: 77 U/L (ref 39–117)

## 2007-06-25 ENCOUNTER — Ambulatory Visit (HOSPITAL_COMMUNITY): Admission: RE | Admit: 2007-06-25 | Discharge: 2007-06-25 | Payer: Self-pay | Admitting: Oncology

## 2007-06-30 ENCOUNTER — Ambulatory Visit: Payer: Self-pay | Admitting: Pulmonary Disease

## 2007-07-16 ENCOUNTER — Ambulatory Visit: Payer: Self-pay | Admitting: Oncology

## 2007-08-08 ENCOUNTER — Telehealth (INDEPENDENT_AMBULATORY_CARE_PROVIDER_SITE_OTHER): Payer: Self-pay | Admitting: *Deleted

## 2007-08-08 ENCOUNTER — Ambulatory Visit: Payer: Self-pay | Admitting: Pulmonary Disease

## 2007-08-15 ENCOUNTER — Telehealth (INDEPENDENT_AMBULATORY_CARE_PROVIDER_SITE_OTHER): Payer: Self-pay | Admitting: *Deleted

## 2007-08-15 DIAGNOSIS — R0602 Shortness of breath: Secondary | ICD-10-CM | POA: Insufficient documentation

## 2007-08-18 ENCOUNTER — Telehealth (INDEPENDENT_AMBULATORY_CARE_PROVIDER_SITE_OTHER): Payer: Self-pay | Admitting: *Deleted

## 2007-08-18 ENCOUNTER — Encounter: Payer: Self-pay | Admitting: Adult Health

## 2007-08-21 ENCOUNTER — Ambulatory Visit: Payer: Self-pay | Admitting: Pulmonary Disease

## 2007-08-27 ENCOUNTER — Ambulatory Visit: Payer: Self-pay | Admitting: Oncology

## 2007-09-01 LAB — CBC WITH DIFFERENTIAL/PLATELET
BASO%: 0.1 % (ref 0.0–2.0)
Basophils Absolute: 0 10*3/uL (ref 0.0–0.1)
Eosinophils Absolute: 0.1 10*3/uL (ref 0.0–0.5)
HCT: 42.7 % (ref 34.8–46.6)
HGB: 15 g/dL (ref 11.6–15.9)
MONO#: 0.7 10*3/uL (ref 0.1–0.9)
NEUT#: 3.9 10*3/uL (ref 1.5–6.5)
NEUT%: 66 % (ref 39.6–76.8)
Platelets: 265 10*3/uL (ref 145–400)
WBC: 6 10*3/uL (ref 3.9–10.0)
lymph#: 1.3 10*3/uL (ref 0.9–3.3)

## 2007-09-01 LAB — COMPREHENSIVE METABOLIC PANEL
ALT: 43 U/L — ABNORMAL HIGH (ref 0–35)
BUN: 13 mg/dL (ref 6–23)
CO2: 28 mEq/L (ref 19–32)
Calcium: 9.4 mg/dL (ref 8.4–10.5)
Chloride: 106 mEq/L (ref 96–112)
Creatinine, Ser: 0.78 mg/dL (ref 0.40–1.20)
Glucose, Bld: 86 mg/dL (ref 70–99)

## 2007-09-01 LAB — LACTATE DEHYDROGENASE: LDH: 131 U/L (ref 94–250)

## 2007-10-29 ENCOUNTER — Ambulatory Visit: Payer: Self-pay | Admitting: Oncology

## 2007-11-03 ENCOUNTER — Ambulatory Visit (HOSPITAL_COMMUNITY): Admission: RE | Admit: 2007-11-03 | Discharge: 2007-11-03 | Payer: Self-pay | Admitting: Oncology

## 2007-11-03 LAB — CBC WITH DIFFERENTIAL/PLATELET
BASO%: 0.5 % (ref 0.0–2.0)
Basophils Absolute: 0 10*3/uL (ref 0.0–0.1)
Eosinophils Absolute: 0.1 10*3/uL (ref 0.0–0.5)
HCT: 41.1 % (ref 34.8–46.6)
HGB: 14.7 g/dL (ref 11.6–15.9)
LYMPH%: 27.6 % (ref 14.0–48.0)
MONO#: 0.5 10*3/uL (ref 0.1–0.9)
NEUT%: 61.2 % (ref 39.6–76.8)
Platelets: 246 10*3/uL (ref 145–400)
WBC: 5.2 10*3/uL (ref 3.9–10.0)
lymph#: 1.4 10*3/uL (ref 0.9–3.3)

## 2007-11-03 LAB — COMPREHENSIVE METABOLIC PANEL
ALT: 48 U/L — ABNORMAL HIGH (ref 0–35)
BUN: 15 mg/dL (ref 6–23)
CO2: 24 mEq/L (ref 19–32)
Calcium: 9.7 mg/dL (ref 8.4–10.5)
Chloride: 102 mEq/L (ref 96–112)
Creatinine, Ser: 0.83 mg/dL (ref 0.40–1.20)
Glucose, Bld: 91 mg/dL (ref 70–99)
Total Bilirubin: 0.8 mg/dL (ref 0.3–1.2)

## 2007-11-03 LAB — LACTATE DEHYDROGENASE: LDH: 151 U/L (ref 94–250)

## 2007-11-03 LAB — CANCER ANTIGEN 27.29: CA 27.29: 35 U/mL (ref 0–39)

## 2007-12-31 ENCOUNTER — Ambulatory Visit: Payer: Self-pay | Admitting: Oncology

## 2008-02-23 ENCOUNTER — Ambulatory Visit: Payer: Self-pay | Admitting: Oncology

## 2008-05-07 ENCOUNTER — Ambulatory Visit: Payer: Self-pay | Admitting: Oncology

## 2008-05-11 ENCOUNTER — Telehealth (INDEPENDENT_AMBULATORY_CARE_PROVIDER_SITE_OTHER): Payer: Self-pay | Admitting: *Deleted

## 2008-05-11 ENCOUNTER — Ambulatory Visit (HOSPITAL_COMMUNITY): Admission: RE | Admit: 2008-05-11 | Discharge: 2008-05-11 | Payer: Self-pay | Admitting: Oncology

## 2008-05-11 LAB — COMPREHENSIVE METABOLIC PANEL
AST: 141 U/L — ABNORMAL HIGH (ref 0–37)
Alkaline Phosphatase: 68 U/L (ref 39–117)
BUN: 12 mg/dL (ref 6–23)
Creatinine, Ser: 0.73 mg/dL (ref 0.40–1.20)
Glucose, Bld: 108 mg/dL — ABNORMAL HIGH (ref 70–99)
Potassium: 3.8 mEq/L (ref 3.5–5.3)
Total Bilirubin: 1 mg/dL (ref 0.3–1.2)

## 2008-05-11 LAB — CBC WITH DIFFERENTIAL/PLATELET
BASO%: 0.9 % (ref 0.0–2.0)
Basophils Absolute: 0 10*3/uL (ref 0.0–0.1)
EOS%: 1.8 % (ref 0.0–7.0)
HCT: 40.9 % (ref 34.8–46.6)
MCH: 35 pg — ABNORMAL HIGH (ref 26.0–34.0)
MCHC: 35.3 g/dL (ref 32.0–36.0)
MCV: 99.1 fL (ref 81.0–101.0)
MONO%: 10 % (ref 0.0–13.0)
NEUT%: 55.2 % (ref 39.6–76.8)
lymph#: 1.3 10*3/uL (ref 0.9–3.3)

## 2008-05-11 LAB — LACTATE DEHYDROGENASE: LDH: 226 U/L (ref 94–250)

## 2008-05-12 LAB — TSH: TSH: 1.486 u[IU]/mL (ref 0.350–4.500)

## 2008-05-21 ENCOUNTER — Other Ambulatory Visit: Admission: RE | Admit: 2008-05-21 | Discharge: 2008-05-21 | Payer: Self-pay | Admitting: Obstetrics and Gynecology

## 2008-05-24 LAB — CELL SEARCH FOR BREAST CANCER

## 2008-07-05 ENCOUNTER — Ambulatory Visit: Payer: Self-pay | Admitting: Pulmonary Disease

## 2008-07-05 DIAGNOSIS — R053 Chronic cough: Secondary | ICD-10-CM | POA: Insufficient documentation

## 2008-07-05 DIAGNOSIS — R05 Cough: Secondary | ICD-10-CM

## 2008-09-14 ENCOUNTER — Ambulatory Visit: Payer: Self-pay | Admitting: Oncology

## 2008-12-15 ENCOUNTER — Ambulatory Visit (HOSPITAL_COMMUNITY): Admission: RE | Admit: 2008-12-15 | Discharge: 2008-12-15 | Payer: Self-pay | Admitting: Anesthesiology

## 2009-01-11 ENCOUNTER — Ambulatory Visit: Payer: Self-pay | Admitting: Oncology

## 2009-01-13 ENCOUNTER — Ambulatory Visit (HOSPITAL_COMMUNITY): Admission: RE | Admit: 2009-01-13 | Discharge: 2009-01-13 | Payer: Self-pay | Admitting: Oncology

## 2009-01-13 LAB — CBC WITH DIFFERENTIAL/PLATELET
BASO%: 0.5 % (ref 0.0–2.0)
Eosinophils Absolute: 0.1 10*3/uL (ref 0.0–0.5)
LYMPH%: 30.6 % (ref 14.0–49.7)
MCHC: 35.2 g/dL (ref 31.5–36.0)
MCV: 96.3 fL (ref 79.5–101.0)
MONO%: 9.7 % (ref 0.0–14.0)
NEUT#: 2.5 10*3/uL (ref 1.5–6.5)
Platelets: 222 10*3/uL (ref 145–400)
RBC: 4.31 10*6/uL (ref 3.70–5.45)
RDW: 13.3 % (ref 11.2–14.5)
WBC: 4.3 10*3/uL (ref 3.9–10.3)
nRBC: 0 % (ref 0–0)

## 2009-01-13 LAB — COMPREHENSIVE METABOLIC PANEL
ALT: 175 U/L — ABNORMAL HIGH (ref 0–35)
AST: 99 U/L — ABNORMAL HIGH (ref 0–37)
Alkaline Phosphatase: 62 U/L (ref 39–117)
CO2: 26 mEq/L (ref 19–32)
Creatinine, Ser: 0.68 mg/dL (ref 0.40–1.20)
Sodium: 140 mEq/L (ref 135–145)
Total Bilirubin: 1 mg/dL (ref 0.3–1.2)
Total Protein: 6.6 g/dL (ref 6.0–8.3)

## 2009-01-13 LAB — LACTATE DEHYDROGENASE: LDH: 205 U/L (ref 94–250)

## 2009-01-14 LAB — VITAMIN D 25 HYDROXY (VIT D DEFICIENCY, FRACTURES): Vit D, 25-Hydroxy: 28 ng/mL — ABNORMAL LOW (ref 30–89)

## 2009-02-21 ENCOUNTER — Ambulatory Visit: Payer: Self-pay | Admitting: Oncology

## 2009-05-16 ENCOUNTER — Ambulatory Visit: Payer: Self-pay | Admitting: Oncology

## 2009-05-20 LAB — CBC WITH DIFFERENTIAL/PLATELET
Basophils Absolute: 0 10*3/uL (ref 0.0–0.1)
EOS%: 0.3 % (ref 0.0–7.0)
Eosinophils Absolute: 0 10*3/uL (ref 0.0–0.5)
HCT: 43.3 % (ref 34.8–46.6)
HGB: 15.1 g/dL (ref 11.6–15.9)
MCH: 35.1 pg — ABNORMAL HIGH (ref 25.1–34.0)
MCV: 100.3 fL (ref 79.5–101.0)
MONO%: 9.3 % (ref 0.0–14.0)
NEUT#: 3.9 10*3/uL (ref 1.5–6.5)
NEUT%: 64.9 % (ref 38.4–76.8)
Platelets: 243 10*3/uL (ref 145–400)
RDW: 13.4 % (ref 11.2–14.5)

## 2009-05-20 LAB — COMPREHENSIVE METABOLIC PANEL
AST: 45 U/L — ABNORMAL HIGH (ref 0–37)
Albumin: 3.9 g/dL (ref 3.5–5.2)
Alkaline Phosphatase: 53 U/L (ref 39–117)
BUN: 9 mg/dL (ref 6–23)
Calcium: 8.7 mg/dL (ref 8.4–10.5)
Creatinine, Ser: 0.67 mg/dL (ref 0.40–1.20)
Glucose, Bld: 116 mg/dL — ABNORMAL HIGH (ref 70–99)
Potassium: 3.5 mEq/L (ref 3.5–5.3)

## 2009-05-21 LAB — CANCER ANTIGEN 27.29: CA 27.29: 39 U/mL (ref 0–39)

## 2009-05-21 LAB — GAMMA GT: GGT: 56 U/L — ABNORMAL HIGH (ref 7–51)

## 2009-09-20 ENCOUNTER — Ambulatory Visit: Payer: Self-pay | Admitting: Oncology

## 2009-09-22 ENCOUNTER — Ambulatory Visit (HOSPITAL_COMMUNITY): Admission: RE | Admit: 2009-09-22 | Discharge: 2009-09-22 | Payer: Self-pay | Admitting: Oncology

## 2009-09-22 LAB — COMPREHENSIVE METABOLIC PANEL
ALT: 129 U/L — ABNORMAL HIGH (ref 0–35)
AST: 105 U/L — ABNORMAL HIGH (ref 0–37)
CO2: 26 mEq/L (ref 19–32)
Chloride: 103 mEq/L (ref 96–112)
Sodium: 137 mEq/L (ref 135–145)
Total Bilirubin: 1 mg/dL (ref 0.3–1.2)
Total Protein: 7.3 g/dL (ref 6.0–8.3)

## 2009-09-22 LAB — CBC WITH DIFFERENTIAL/PLATELET
BASO%: 0.5 % (ref 0.0–2.0)
EOS%: 1.2 % (ref 0.0–7.0)
LYMPH%: 33.7 % (ref 14.0–49.7)
MCH: 34.5 pg — ABNORMAL HIGH (ref 25.1–34.0)
MCHC: 34 g/dL (ref 31.5–36.0)
MONO#: 0.5 10*3/uL (ref 0.1–0.9)
RBC: 4.31 10*6/uL (ref 3.70–5.45)
WBC: 4.6 10*3/uL (ref 3.9–10.3)
lymph#: 1.6 10*3/uL (ref 0.9–3.3)

## 2009-09-22 LAB — LACTATE DEHYDROGENASE: LDH: 180 U/L (ref 94–250)

## 2009-11-15 ENCOUNTER — Ambulatory Visit: Payer: Self-pay | Admitting: Oncology

## 2009-11-15 ENCOUNTER — Telehealth (INDEPENDENT_AMBULATORY_CARE_PROVIDER_SITE_OTHER): Payer: Self-pay | Admitting: *Deleted

## 2009-11-16 ENCOUNTER — Ambulatory Visit: Payer: Self-pay | Admitting: Cardiology

## 2009-11-16 ENCOUNTER — Encounter (HOSPITAL_COMMUNITY): Admission: RE | Admit: 2009-11-16 | Discharge: 2010-01-11 | Payer: Self-pay | Admitting: Internal Medicine

## 2009-11-16 ENCOUNTER — Ambulatory Visit: Payer: Self-pay

## 2009-11-29 ENCOUNTER — Ambulatory Visit: Payer: Self-pay | Admitting: Internal Medicine

## 2009-11-29 DIAGNOSIS — I493 Ventricular premature depolarization: Secondary | ICD-10-CM | POA: Insufficient documentation

## 2009-12-06 ENCOUNTER — Ambulatory Visit: Payer: Self-pay

## 2009-12-06 ENCOUNTER — Encounter: Payer: Self-pay | Admitting: Internal Medicine

## 2009-12-19 ENCOUNTER — Ambulatory Visit: Payer: Self-pay | Admitting: Internal Medicine

## 2009-12-22 ENCOUNTER — Telehealth: Payer: Self-pay | Admitting: Internal Medicine

## 2009-12-22 LAB — CONVERTED CEMR LAB
Calcium: 9.4 mg/dL (ref 8.4–10.5)
Creatinine, Ser: 0.69 mg/dL (ref 0.40–1.20)

## 2009-12-26 ENCOUNTER — Ambulatory Visit: Payer: Self-pay | Admitting: Oncology

## 2010-01-17 ENCOUNTER — Ambulatory Visit (HOSPITAL_COMMUNITY): Admission: RE | Admit: 2010-01-17 | Discharge: 2010-01-17 | Payer: Self-pay | Admitting: Internal Medicine

## 2010-01-17 ENCOUNTER — Ambulatory Visit: Payer: Self-pay | Admitting: Cardiovascular Disease

## 2010-01-23 ENCOUNTER — Telehealth: Payer: Self-pay | Admitting: Internal Medicine

## 2010-02-07 ENCOUNTER — Encounter: Payer: Self-pay | Admitting: Internal Medicine

## 2010-02-07 ENCOUNTER — Ambulatory Visit: Payer: Self-pay | Admitting: Internal Medicine

## 2010-03-06 ENCOUNTER — Ambulatory Visit: Payer: Self-pay | Admitting: Internal Medicine

## 2010-03-07 LAB — CONVERTED CEMR LAB
Basophils Absolute: 0 10*3/uL (ref 0.0–0.1)
CO2: 28 meq/L (ref 19–32)
Calcium: 9.3 mg/dL (ref 8.4–10.5)
Hemoglobin: 15 g/dL (ref 12.0–15.0)
Lymphocytes Relative: 30 % (ref 12–46)
Monocytes Absolute: 0.8 10*3/uL (ref 0.1–1.0)
Monocytes Relative: 12 % (ref 3–12)
Neutro Abs: 4.1 10*3/uL (ref 1.7–7.7)
RBC: 4.39 M/uL (ref 3.87–5.11)
RDW: 12.6 % (ref 11.5–15.5)
Sodium: 138 meq/L (ref 135–145)
aPTT: 29 s (ref 24–37)

## 2010-03-10 ENCOUNTER — Ambulatory Visit: Payer: Self-pay | Admitting: Internal Medicine

## 2010-03-10 ENCOUNTER — Inpatient Hospital Stay (HOSPITAL_BASED_OUTPATIENT_CLINIC_OR_DEPARTMENT_OTHER): Admission: RE | Admit: 2010-03-10 | Discharge: 2010-03-10 | Payer: Self-pay | Admitting: Internal Medicine

## 2010-03-21 ENCOUNTER — Ambulatory Visit: Payer: Self-pay | Admitting: Oncology

## 2010-03-23 ENCOUNTER — Ambulatory Visit (HOSPITAL_COMMUNITY): Admission: RE | Admit: 2010-03-23 | Discharge: 2010-03-23 | Payer: Self-pay | Admitting: Oncology

## 2010-03-23 LAB — CBC WITH DIFFERENTIAL/PLATELET
Basophils Absolute: 0 10*3/uL (ref 0.0–0.1)
EOS%: 1.6 % (ref 0.0–7.0)
Eosinophils Absolute: 0.1 10*3/uL (ref 0.0–0.5)
HCT: 42.3 % (ref 34.8–46.6)
HGB: 14.6 g/dL (ref 11.6–15.9)
MCH: 34.4 pg — ABNORMAL HIGH (ref 25.1–34.0)
MONO#: 0.6 10*3/uL (ref 0.1–0.9)
NEUT%: 55.3 % (ref 38.4–76.8)
lymph#: 1.7 10*3/uL (ref 0.9–3.3)

## 2010-03-23 LAB — COMPREHENSIVE METABOLIC PANEL
BUN: 11 mg/dL (ref 6–23)
CO2: 27 mEq/L (ref 19–32)
Calcium: 8.9 mg/dL (ref 8.4–10.5)
Chloride: 102 mEq/L (ref 96–112)
Creatinine, Ser: 0.62 mg/dL (ref 0.40–1.20)
Glucose, Bld: 103 mg/dL — ABNORMAL HIGH (ref 70–99)

## 2010-03-23 LAB — LACTATE DEHYDROGENASE: LDH: 196 U/L (ref 94–250)

## 2010-03-23 LAB — VITAMIN D 25 HYDROXY (VIT D DEFICIENCY, FRACTURES): Vit D, 25-Hydroxy: 54 ng/mL (ref 30–89)

## 2010-03-27 ENCOUNTER — Telehealth: Payer: Self-pay | Admitting: Internal Medicine

## 2010-03-29 ENCOUNTER — Encounter: Payer: Self-pay | Admitting: Internal Medicine

## 2010-05-12 ENCOUNTER — Ambulatory Visit: Payer: Self-pay | Admitting: Internal Medicine

## 2010-06-21 ENCOUNTER — Encounter: Payer: Self-pay | Admitting: Internal Medicine

## 2010-07-11 ENCOUNTER — Ambulatory Visit: Payer: Self-pay

## 2010-07-31 ENCOUNTER — Ambulatory Visit: Payer: Self-pay | Admitting: Internal Medicine

## 2010-08-31 ENCOUNTER — Other Ambulatory Visit: Payer: Self-pay | Admitting: Oncology

## 2010-08-31 DIAGNOSIS — C50919 Malignant neoplasm of unspecified site of unspecified female breast: Secondary | ICD-10-CM

## 2010-09-03 ENCOUNTER — Encounter: Payer: Self-pay | Admitting: Internal Medicine

## 2010-09-03 ENCOUNTER — Encounter: Payer: Self-pay | Admitting: Surgery

## 2010-09-03 ENCOUNTER — Encounter: Payer: Self-pay | Admitting: Oncology

## 2010-09-12 NOTE — Progress Notes (Signed)
Summary: PHI  PHI   Imported By: Harlon Flor 11/30/2009 12:28:01  _____________________________________________________________________  External Attachment:    Type:   Image     Comment:   External Document

## 2010-09-12 NOTE — Progress Notes (Signed)
Summary: RESULTS  Phone Note Call from Patient Call back at [phone]   Caller: SELF Call For: BENSIMHON Summary of Call: PT WOULD LIKE THE RESULTS OF HER MRI OF THE HEART Initial call taken by: Harlon Flor,  January 23, 2010 2:30 PM  Follow-up for Phone Call        pt called MD has not read the results will notify her when results are available.  Follow-up by: Benedict Needy, RN,  January 23, 2010 4:16 PM

## 2010-09-12 NOTE — Letter (Signed)
Summary: Cardiac Catheterization Instructions- [facility]  [facility], Main Office  [address]   [place], [place] [address]   Phone: [phone]  Fax: [phone]     02/07/2010 MRN: [id number]  Suzanne [name] [address] [place], [place]  [address]  Dear [name],   You are scheduled for a Cardiac Catheterization on Friday July 29 with Dr. Gala Romney  Please arrive to the 1st floor of the Heart and Vascular Center at [facility] at 11:30 am / pm on the day of your procedure. Please do not arrive before 6:30 a.m. Call the Heart and Vascular Center at [phone] if you are unable to make your appointmnet. The Code to get into the parking garage under the building is 1000. Take the elevators to the 1st floor. You must have someone to drive you home. Someone must be with you for the first 24 hours after you arrive home. Please wear clothes that are easy to get on and off and wear slip-on shoes. Do not eat or drink after midnight except water with your medications that morning. Bring all your medications and current insurance cards with you.  _X__ DO NOT take these medications before your procedure:   Take last dose of coumadin on Monday July 25  ___ Make sure you take your aspirin.  ___ You may take ALL of your medications with water that morning. ________________________________________________________________________________________________________________________________  ___ DO NOT take ANY medications before your procedure.  ___ Pre-med instructions:  ________________________________________________________________________________________________________________________________  The usual length of stay after your procedure is 2 to 3 hours. This can vary.  If you have any questions, please call the office at the number listed above.   Meredith Staggers, RN

## 2010-09-12 NOTE — Letter (Signed)
Summary: External Other  External Other   Imported By: Harlon Flor 11/30/2009 12:26:49  _____________________________________________________________________  External Attachment:    Type:   Image     Comment:   External Document

## 2010-09-12 NOTE — Miscellaneous (Signed)
Summary: Pre cath labs  Clinical Lists Changes  Problems: Added new problem of ENCOUNTER FOR LONG-TERM USE OF OTHER MEDICATIONS (ICD-V58.69) Added new problem of PRE-OPERATIVE CARDIOVASCULAR EXAMINATION (ICD-V72.81) Orders: Added new Test order of T-Basic Metabolic Panel [phone]) - Signed Added new Test order of T-CBC w/Diff [phone]) - Signed Added new Test order of T-Protime, Auto ([id number]) - Signed Added new Test order of T-PTT ([id number]) - Signed

## 2010-09-12 NOTE — Letter (Signed)
[facility]   Imported By: Roderic Ovens 05/16/2010 15:29:40  _____________________________________________________________________  External Attachment:    Type:   Image     Comment:   External Document

## 2010-09-12 NOTE — Assessment & Plan Note (Signed)
Summary: [facility]   Referring Provider:  Dr Elisabeth Most Primary Provider:  Dr Marisue Brooklyn  CC:  ROV;Chest tightness w/ stress.  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 Elisabeth Most to f/u on recent stress test.   Found to have breast CA in 94. Underwent R mastectomy in 1994 with flap reconstructiona and chemotherapy. In 2007 found to have local recurrence treated with XRT and chemo. Developed dyspnea in 2007 and found to have weakened R hemidiaphragm of unclear cause.   Very active. Has had DOE with moderate activity since 2007. Occasional mild chest tightness. No real change. Under lots of stress recently as 91 year old daughter moved to [place] to live with her boyfriend. no swelling, orthopnea or pnd.   Has had several recent tests and here to discuss:  Myoview results showed EF 55% with anterior soft tissue attenuationa with question of mild reverible anterior ischemia. Thought to be breast attenuation.   Echo EF 45-50% with global HK.   Cardiac MRI EF 44% with septal HK. No scar.   Current Medications (verified): 1)  Warfarin Sodium 1 Mg  Tabs (Warfarin Sodium) .... As Directed 2)  Femara 2.5 Mg  Tabs (Letrozole) .Marland Kitchen.. 1 By Mouth Daily 3)  Ativan 1 Mg  Tabs (Lorazepam) .... As Needed 4)  Zyrtec Allergy 10 Mg Caps (Cetirizine Hcl) .... As Needed 5)  Calcium Carbonate-Vitamin D 600-400 Mg-Unit  Tabs (Calcium Carbonate-Vitamin D) .... Take 2 By Mouth Once Daily 6)  Vitamin D 1000 Unit  Tabs (Cholecalciferol) .... Once Daily 7)  Multivitamins   Tabs (Multiple Vitamin) .... Once Daily  Allergies: 1)  ! Prednisone  Past History:  Past Medical History: 1. Cancer (Breast) '94  reccured '07     --s/p mastectomy with flap reconstruction and chemo '94    --local recurrence on chest wall. chemo and XRT '07    --now Femara 2. Dyspnea     --Myoview 2011: EF 55% question of mild  reverible anterior defect. Thought to be breast attenuation.      --Echo EF 45-50% with global HK. Grade 1 diastolic dysfx. RV normal.     --Cardiac MRI EF 44% with septal HK. No scar.      Review of Systems       As per HPI and past medical history; otherwise all systems negative.   Vital Signs:  Patient profile:   55 year old female Height:      65 inches Weight:      174 pounds BMI:     29.06 Pulse rate:   73 / minute Pulse rhythm:   regular BP sitting:   122 / 82  (left arm) Cuff size:   regular  Vitals Entered By: Stanton Kidney, EMT-P (February 07, 2010 11:54 AM)  Physical Exam  General:  Gen: well appearing. no resp difficulty HEENT: normal Neck: supple. no JVD. Carotids 2+ bilat; no bruits. No lymphadenopathy or thryomegaly appreciated. Cor: PMI nondisplaced. Regular rate & rhythm. No rubs, gallops, murmur. + left portacath Lungs: clear mildly decreased at R base Abdomen: soft, nontender, nondistended. No hepatosplenomegaly. No bruits or masses. Good bowel sounds. Extremities: no cyanosis, clubbing, rash, edema Neuro: alert & orientedx3, cranial nerves grossly intact. moves all 4 extremities w/o difficulty. affect pleasant    Impression & Recommendations:  Problem # 1:  LEFT VENTRICULAR FUNCTION, DECREASED (ICD-429.2) We had long talk about the results of her stress  tests. 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 However, given her family history, symptoms and history of chest XRT, I have recommended R and L heart cath to definitively evaluate for CAD. We will start low dose coreg 3.125 two times a day and lisinopril 5mg  daily (will stagger their start given her concerns over fatigue).   Patient Instructions: 1)  Start Carvedilol 3.125mg  two times a day  2)  Start Lisinopril 5mg  daily 3)  Your physician recommends that you return for lab work in: week of July 25th 4)  Your physician has requested that you have a cardiac  catheterization.  Cardiac catheterization is used to diagnose and/or treat various heart conditions. Doctors may recommend this procedure for a number of different reasons. The most common reason is to evaluate chest pain. Chest pain can be a symptom of coronary artery disease (CAD), and cardiac catheterization can show whether plaque is narrowing or blocking your heart's arteries. This procedure is also used to evaluate the valves, as well as measure the blood flow and oxygen levels in different parts of your heart.  For further information please visit [email or web address]  Please follow instruction sheet, as given. 5)  Your physician wants you to follow-up in:  4 months.  You will receive a reminder letter in the mail two months in advance. If you don't receive a letter, please call our office to schedule the follow-up appointment. Prescriptions: LISINOPRIL 5 MG TABS (LISINOPRIL) Take one tablet by mouth daily  #30 x 6   Entered by:   Meredith Staggers, RN   Authorized by:   Dolores Patty, MD, [facility]   Signed by:   Meredith Staggers, RN on 02/07/2010   Method used:   [facility] to        [facility]  [address] [phone] (retail)       [address]       [place], [place]  [address]       Ph: [phone] or [phone]       Fax: [phone]   RxID:   [id number] CARVEDILOL 3.125 MG TABS (CARVEDILOL) Take one tablet by mouth twice a day  #60 x 6   Entered by:   Meredith Staggers, RN   Authorized by:   Dolores Patty, MD, [facility]   Signed by:   Meredith Staggers, RN on 02/07/2010   Method used:   [facility]        [facility]  [phone] (retail)       [address]       [place], [place]  [address]       Ph: [phone] or [phone]       Fax: [phone]   RxID:   [id number]

## 2010-09-12 NOTE — Assessment & Plan Note (Signed)
Summary: np6   Visit Type:  Initial Consult Referring Provider:  Dr Elisabeth Most Primary Provider:  Dr Marisue Brooklyn  CC:  chronic sob and (R lung doesn't work).  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 Elisabeth Most to f/o on recent stress test.   Found to have breast CA in 94. Underwent R mastectomy in 1994 with flap reconstructiona and chemotherapy. In 2007 found to have local recurrence treated with XRT and chemo. Developed dyspnea in 2007 and found to have weakened R hemidiaphragm of unclear cause.   Has had DOE with moderate activity since 2007. No real change since that time. No orthopnea, PND or lower extremity edema. No CP. Recently saw Dr. Elisabeth Most for first time and sent for Myoview to evaluate DOE. Has never had echo. Uses elliptical without difficulty for 30 min hard workout. Does have frequent PVCs.  Myoview results showed EF 55% with anterior soft tissue attenuationa dn question of mild reverible anterior ischemia.  Preventive Screening-Counseling & Management  Alcohol-Tobacco     Smoking Status: quit  Caffeine-Diet-Exercise     Does Patient Exercise: yes      Drug Use:  no.    Current Medications (verified): 1)  Warfarin Sodium 1 Mg  Tabs (Warfarin Sodium) .... As Directed 2)  Femara 2.5 Mg  Tabs (Letrozole) .Marland Kitchen.. 1 By Mouth Daily 3)  Ativan 1 Mg  Tabs (Lorazepam) .... As Needed 4)  Zyrtec Allergy 10 Mg Caps (Cetirizine Hcl) .... As Needed 5)  Calcium Carbonate-Vitamin D 600-400 Mg-Unit  Tabs (Calcium Carbonate-Vitamin D) .... Two Times A Day 6)  Vitamin D 1000 Unit  Tabs (Cholecalciferol) .... Once Daily 7)  Multivitamins   Tabs (Multiple Vitamin) .... Once Daily  Allergies (verified): 1)  ! Prednisone  Past History:  Family History: Last updated: 11/29/2009 Father: Family History of Coronary Artery Disease:  Family History of Hyperlipidemia:  Family History of Hypertension:    Mother: Family History of Thyroid Disease:   Social History: Last updated: 11/29/2009 Married  Tobacco Use - Former.  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 Cancer (Breast) '94  reccured '07     --s/p mastectomy with flap reconstruction and chemo '94    --local recurrence on chest wall. chemo and XRT '07    --now Femara      Past Surgical History: R transflap '94 R mastectomy '94  Family History: Reviewed history and no changes required. Father: Family History of Coronary Artery Disease:  Family History of Hyperlipidemia:  Family History of Hypertension:  Mother: Family History of Thyroid Disease:   Social History: Reviewed history and no changes required. Married  Tobacco Use - Former.  (just socially) Alcohol Use - yes -- occasionally Regular Exercise - yes Drug Use - no Disabled  --- 2007 RNDoes Patient Exercise:  yes Drug Use:  no  Review of Systems       As per HPI and past medical history; otherwise all systems negative.   Vital Signs:  Patient profile:   55 year old female Height:      65 inches Weight:      175 pounds BMI:     29.23 Pulse rate:   73 / minute BP sitting:   124 / 80  (  left arm) Cuff size:   large  Vitals Entered By: Hardin Negus, RMA (November 29, 2009 2:01 PM)  Physical Exam  General:  Gen: well appearing. no resp difficulty HEENT: normal Neck: supple. no JVD. Carotids 2+ bilat; no bruits. No lymphadenopathy or thryomegaly appreciated. Cor: PMI nondisplaced. Regular rate & rhythm. No rubs, gallops, murmur. + left portacath Lungs: clear mildly decreased at R base Abdomen: soft, nontender, nondistended. No hepatosplenomegaly. No bruits or masses. Good bowel sounds. Extremities: no cyanosis, clubbing, rash, edema Neuro: alert &  orientedx3, cranial nerves grossly intact. moves all 4 extremities w/o difficulty. affect pleasant    Problems:  Medical Problems Added: 1)  Dx of Abnormal Cv (STRESS) Test  (ICD-794.39) 2)  Dx of Premature Ventricular Contractions  (ICD-427.69)  Impression & Recommendations:  Problem # 1:  ABNORMAL CV (STRESS) TEST (ICD-794.39) We had long discussion about her stress test. I suspect this represents shifting breast attenuation and not ischemia. Have suggested we get echo to further evlauate, if that is normal and she remains asymptomatic would not pursue further at this point. That said, we did discuss the possibility of cath to evaluate definitively but we have decided to defer at this point.   Problem # 2:  PREMATURE VENTRICULAR CONTRACTIONS (ICD-427.69) Benign. Checking echo as above.  Other Orders: Echocardiogram (Echo)  Patient Instructions: 1)  Your physician recommends that you schedule a follow-up appointment in: 3 months 2)  Your physician has requested that you have an echocardiogram.  Echocardiography is a painless test that uses sound waves to create images of your heart. It provides your doctor with information about the size and shape of your heart and how well your heart's chambers and valves are working.  This procedure takes approximately one hour. There are no restrictions for this procedure.

## 2010-09-12 NOTE — Miscellaneous (Signed)
Summary: Orders Update  Clinical Lists Changes  Orders: Added new Referral order of Echocardiogram (Echo) - Signed 

## 2010-09-12 NOTE — Progress Notes (Signed)
Summary: RESULTS  Phone Note Call from Patient Call back at Home Phone [phone]   Caller: SELF Call For: BENSIMHON Summary of Call: WOULD LIKE LAB RESULTS Initial call taken by: Harlon Flor,  Dec 22, 2009 12:17 PM  Follow-up for Phone Call        pt aware of results  Follow-up by: Mercer Pod,  Dec 22, 2009 1:54 PM

## 2010-09-12 NOTE — Assessment & Plan Note (Signed)
Summary: Cardiology Nuclear Study  Nuclear Med Background Indications for Stress Test: Evaluation for Ischemia   History: Asthma, COPD, History of Chemo   Symptoms: DOE, Palpitations    Nuclear Pre-Procedure Caffeine/Decaff Intake: none NPO After: 9:00 AM Lungs: clear IV 0.9% NS with Angio Cath: 20g     IV Site: (L) Forearm IV Started by: Stanton Kidney EMT-P Chest Size (in) 36     Cup Size C     Height (in): 65 Weight (lb): 174 BMI: 29.06  Nuclear Med Study 1 or 2 day study:  1 day     Stress Test Type:  Stress Reading MD:  Olga Millers, MD     Referring MD:  A.Stevenson Resting Radionuclide:  Technetium 89m Tetrofosmin     Resting Radionuclide Dose:  11 mCi  Stress Radionuclide:  Technetium 9m Tetrofosmin     Stress Radionuclide Dose:  33 mCi   Stress Protocol Exercise Time (min):  9:00 min     Max HR:  148 bpm     Predicted Max HR:  168 bpm  Max Systolic BP: 174 mm Hg     Percent Max HR:  88.10 %     METS: 0.10 Rate Pressure Product:  [address]    Stress Test Technologist:  Milana Na EMT-P     Nuclear Technologist:  Domenic Polite CNMT  Rest Procedure  Myocardial perfusion imaging was performed at rest 45 minutes following the intravenous administration of Myoview Technetium 40m Tetrofosmin.  Stress Procedure  The patient exercised for 9:00. The patient stopped due to fatigue and denied any chest pain.  There were no significant ST-T wave changes and freq pvcs.  Myoview was injected at peak exercise and myocardial perfusion imaging was performed after a brief delay.  QPS Raw Data Images:  Acuisition technically good; normal left ventricular size. Stress Images:  There is decreased uptake in the anterior wall and apex. Rest Images:  There is decreased uptake in the anterior wall and apex, less prominent compared to the stress images. Subtraction (SDS):  These findings are consistent with soft tissue attenuation and mild anterior ischemia. Transient Ischemic  Dilatation:  1.23  (Normal <1.22)  Lung/Heart Ratio:  .28  (Normal <0.45)  Quantitative Gated Spect Images QGS EDV:  103 ml QGS ESV:  46 ml QGS EF:  55 % QGS cine images:  Normal wall motion.   Overall Impression  Exercise Capacity: Good exercise capacity. BP Response: Normal blood pressure response. Clinical Symptoms: No chest pain ECG Impression: No significant ST segment change suggestive of ischemia; frequent PVCs and rare couplet noted. Overall Impression: Abnormal stress nuclear study with soft tissue attenuation and mild anterior ischemia.

## 2010-09-12 NOTE — Progress Notes (Signed)
Summary: APPT with Bensimhon sept. 29  Phone Note Call from Patient Call back at Home Phone [phone]   Caller: SELF Call For: BENSIMHON Summary of Call: PT WANTS TO KNOW IF SHE NEEDS TO COME IN FOR AN APPT BECAUSE SHE JUST HAD A CATH Initial call taken by: Harlon Flor,  March 27, 2010 2:30 PM  Follow-up for Phone Call        Pt had cardiac cath 7/29 note states pt needs echo in 3 months already scheduled for November. Does she need appointment to see you soon? Please advise.  Follow-up by: Benedict Needy, RN,  March 27, 2010 2:34 PM  Additional Follow-up for Phone Call Additional follow up Details #1::        pls have her come see me in sept or october for medication titration. thanks. Dolores Patty, MD, [facility]  March 27, 2010 5:40 PM  [facility] TCB Benedict Needy, RN  March 28, 2010 10:50 AM  [facility] TCB Benedict Needy, RN  March 29, 2010 9:08 AM     Additional Follow-up for Phone Call Additional follow up Details #2::    scheduled appointment with Dr. Gala Romney for Sept.29 2011.  Patient aware of office visit. Follow-up by: Bishop Dublin, CMA,  March 29, 2010 4:42 PM

## 2010-09-12 NOTE — Assessment & Plan Note (Signed)
Summary: F/U medication titration   Visit Type:  Follow-up Referring Provider:  Dr Elisabeth Most Primary Provider:  Dr Marisue Brooklyn  CC:  "Doing well"..  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 Elisabeth Most to f/u on recent stress test.   Found to have breast CA in 94. Underwent R mastectomy in 1994 with flap reconstructiona and chemotherapy. In 2007 found to have local recurrence treated with XRT and chemo. Developed dyspnea in 2007 and found to have weakened R hemidiaphragm of unclear cause.   Myoview results showed EF 55% with anterior soft tissue attenuationa with question of mild reverible anterior ischemia. Thought to be breast attenuation.   Echo EF 45-50% with global HK.   Cardiac MRI EF 44% with septal HK. No   Cath 6/11: EF 50-55% Left main was normal. Normal cors EF 50-55%   At last visit started coreg and lisinopril. Tolerating them well. Feels really good unless she is tired. When tired feels more SOB and can have some CP. No edema, PND, or orthopnea. Just started back on elliptical. Doing 20 mins without problem. +hot flashes  Current Medications (verified): 1)  Warfarin Sodium 1 Mg  Tabs (Warfarin Sodium) .... As Directed 2)  Femara 2.5 Mg  Tabs (Letrozole) .Marland Kitchen.. 1 By Mouth Daily 3)  Ativan 1 Mg  Tabs (Lorazepam) .... As Needed 4)  Zyrtec Allergy 10 Mg Caps (Cetirizine Hcl) .... As Needed 5)  Calcium Carbonate-Vitamin D 600-400 Mg-Unit  Tabs (Calcium Carbonate-Vitamin D) .... Take 2 By Mouth Once Daily 6)  Vitamin D 1000 Unit  Tabs (Cholecalciferol) .... Once Daily 7)  Multivitamins   Tabs (Multiple Vitamin) .... Once Daily 8)  Carvedilol 3.125 Mg Tabs (Carvedilol) .... Take One Tablet By Mouth Twice A Day 9)  Lisinopril 5 Mg Tabs (Lisinopril) .... Take One Tablet By Mouth Daily  Allergies (verified): 1)  ! Prednisone  Past History:  Past Medical History: Last updated:  02/07/2010 1. Cancer (Breast) '94  reccured '07     --s/p mastectomy with flap reconstruction and chemo '94    --local recurrence on chest wall. chemo and XRT '07    --now Femara 2. Dyspnea     --Myoview 2011: EF 55% question of mild reverible anterior defect. Thought to be breast attenuation.      --Echo EF 45-50% with global HK. Grade 1 diastolic dysfx. RV normal.     --Cardiac MRI EF 44% with septal HK. No scar.      Past Surgical History: Last updated: 11/29/2009 R transflap '94 R mastectomy '94  Family History: Last updated: 11/29/2009 Father: Family History of Coronary Artery Disease:  Family History of Hyperlipidemia:  Family History of Hypertension:  Mother: Family History of Thyroid Disease:   Social History: Last updated: 11/29/2009 Married  Tobacco Use - Former.  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   Vital Signs:  Patient profile:   54 year old female Height:      65 inches Weight:      176 pounds BMI:     29.39 Pulse rate:   84 / minute BP sitting:   122 /  82  (left arm) Cuff size:   regular  Vitals Entered By: Bishop Dublin, CMA (May 12, 2010 3:35 PM)  Physical Exam  General:  Well appearing. no resp difficulty HEENT: normal Neck: supple. no JVD. Carotids 2+ bilat; no bruits. No lymphadenopathy or thryomegaly appreciated. Cor: PMI nondisplaced. Regular rate & rhythm. No rubs, gallops, murmur. + left portacath Lungs: clear mildly decreased at R base Abdomen: soft, nontender, nondistended. No hepatosplenomegaly. No bruits or masses. Good bowel sounds. Extremities: no cyanosis, clubbing, rash, edema Neuro: alert & orientedx3, cranial nerves grossly intact. moves all 4 extremities  w/o difficulty. affect pleasant    Impression & Recommendations:  Problem # 1:  LEFT VENTRICULAR FUNCTION, DECREASED (ICD-429.2) Very mild LV dysfunction. Doing well. NYHA Class I. Volume status well controlled. Will increase carvedilol to 6.25 two times a day.   Other Orders: EKG w/ Interpretation (93000) Prescriptions: CARVEDILOL 6.25 MG TABS (CARVEDILOL) Take one tablet by mouth twice a day  #60 x 6   Entered by:   Benedict Needy, RN   Authorized by:   Dolores Patty, MD, [facility]   Signed by:   Benedict Needy, RN on 05/12/2010   Method used:   [facility]  [address] [phone][facility] (retail)       [address]       [place], [place]  [id number]       Ph: [phone] or [phone]       Fax: [phone]   RxID:   [id number]

## 2010-09-12 NOTE — Progress Notes (Signed)
Summary: Nuclear Pre-Procedure  Phone Note Outgoing Call Call back at [facility] Phone [phone]   Call placed by: Stanton Kidney, EMT-P,  November 15, 2009 2:48 PM Action Taken: Phone Call Completed Summary of Call: Left message with information on [employer] Information Sheet (see scanned document for details).     Nuclear Med Background Indications for Stress Test: Evaluation for Ischemia   History: Asthma, COPD, History of Chemo   Symptoms: DOE, Palpitations    Nuclear Pre-Procedure Height (in): 65

## 2010-09-14 ENCOUNTER — Other Ambulatory Visit (HOSPITAL_COMMUNITY): Payer: Self-pay

## 2010-09-15 ENCOUNTER — Other Ambulatory Visit: Payer: Self-pay | Admitting: Family Medicine

## 2010-09-15 DIAGNOSIS — R52 Pain, unspecified: Secondary | ICD-10-CM

## 2010-09-19 ENCOUNTER — Other Ambulatory Visit: Payer: Self-pay | Admitting: Oncology

## 2010-09-19 DIAGNOSIS — C50919 Malignant neoplasm of unspecified site of unspecified female breast: Secondary | ICD-10-CM

## 2010-09-21 ENCOUNTER — Telehealth: Payer: Self-pay | Admitting: Cardiovascular Disease

## 2010-09-28 NOTE — Progress Notes (Signed)
Summary: Medication problems  Phone Note Call from Patient Call back at Home Phone [phone]   Caller: Self Call For: Bensimhon Summary of Call: Pt has c/o puffy eyes, flat red areas on face that are not acne, and itching.  She feels this is related to the Lisinopril or Coreg.  Pt LMOM here. Initial call taken by: Harlon Flor,  September 21, 2010 8:00 AM  Follow-up for Phone Call        [facility].  Looks like lisinorpril can cause some of the symptoms she has but not sure because she has been on lisinopril for a while.  What do you suggest she do? Follow-up by: Bishop Dublin, CMA,  September 21, 2010 9:26 AM  Additional Follow-up for Phone Call Additional follow up Details #1::        Spoke to Dr. Mariah Milling, he agrees this is unlikely from the lisinopril or coreg as pt has been on these meds for many months and onset of her symptoms are new. Advised that pt see her PCP and that she take benadryl to see if rash/puffy eyes goes away, pt states she has tried this and no change. Instructed pt that she still needs to see PCP because if this is an allergic reaction she may need a steroid as well. Additional Follow-up by: Lanny Hurst RN,  September 21, 2010 12:06 PM

## 2010-10-09 ENCOUNTER — Other Ambulatory Visit (HOSPITAL_COMMUNITY): Payer: Medicare Other

## 2010-10-12 ENCOUNTER — Encounter (HOSPITAL_BASED_OUTPATIENT_CLINIC_OR_DEPARTMENT_OTHER): Payer: MEDICARE | Admitting: Oncology

## 2010-10-12 DIAGNOSIS — C50919 Malignant neoplasm of unspecified site of unspecified female breast: Secondary | ICD-10-CM

## 2010-10-12 DIAGNOSIS — Z452 Encounter for adjustment and management of vascular access device: Secondary | ICD-10-CM

## 2010-10-27 LAB — GLUCOSE, CAPILLARY: Glucose-Capillary: 113 mg/dL — ABNORMAL HIGH (ref 70–99)

## 2010-10-28 LAB — POCT I-STAT 3, VENOUS BLOOD GAS (G3P V)
Acid-Base Excess: 1 mmol/L (ref 0.0–2.0)
Bicarbonate: 24.2 mEq/L — ABNORMAL HIGH (ref 20.0–24.0)
Bicarbonate: 25.8 mEq/L — ABNORMAL HIGH (ref 20.0–24.0)
Bicarbonate: 26.5 mEq/L — ABNORMAL HIGH (ref 20.0–24.0)
O2 Saturation: 64 %
TCO2: 25 mmol/L (ref 0–100)
TCO2: 27 mmol/L (ref 0–100)
pH, Ven: 7.359 — ABNORMAL HIGH (ref 7.250–7.300)
pH, Ven: 7.367 — ABNORMAL HIGH (ref 7.250–7.300)
pH, Ven: 7.404 — ABNORMAL HIGH (ref 7.250–7.300)
pO2, Ven: 68 mmHg — ABNORMAL HIGH (ref 30.0–45.0)

## 2010-11-01 LAB — GLUCOSE, CAPILLARY: Glucose-Capillary: 102 mg/dL — ABNORMAL HIGH (ref 70–99)

## 2010-11-20 LAB — GLUCOSE, CAPILLARY: Glucose-Capillary: 108 mg/dL — ABNORMAL HIGH (ref 70–99)

## 2010-11-30 ENCOUNTER — Telehealth: Payer: Self-pay | Admitting: Internal Medicine

## 2010-11-30 NOTE — Telephone Encounter (Signed)
Pt states that she cannot take Lisinopril b/c it is causing patchy rashes on her face and pulmonary congestion.  Pt did not take the medication last night and the symptoms have subsided.

## 2010-11-30 NOTE — Telephone Encounter (Signed)
Spoke to pt, she was put on Lisinopril 5mg  over 6 months ago, and she did call (see phone note 09/21/10) stating she was having rxn from lisinopril, however, she had been on for so long this was not thought to be from lisinopril. Pt had recently stopped Lisinopril on her own, and did continue taking Coreg 3.125. No meds have changed in the meantime, and pt states symptoms had resolved. Today, pt reports her diastolic BP was 90 so she took a lisinopril after being off of it. The next day she developed what sounds like hives, on face only and a severe cough. Advised pt to take Benadryl and not to take any more lisinopril, told pt this sounds like it is related to taking Lisinopril and will notify Dr. Gala Romney to see what he recommends. Pt does state that her BP is well maintained averaging 130s/80s. Please advise.

## 2010-12-06 ENCOUNTER — Other Ambulatory Visit: Payer: Self-pay | Admitting: Internal Medicine

## 2010-12-06 ENCOUNTER — Telehealth: Payer: Self-pay | Admitting: *Deleted

## 2010-12-06 MED ORDER — VALSARTAN 40 MG PO TABS: 40.0000 mg | ORAL_TABLET | Freq: Two times a day (BID) | ORAL | Status: DC

## 2010-12-06 NOTE — Telephone Encounter (Signed)
Start Diovan 40 bid. thanks

## 2010-12-06 NOTE — Telephone Encounter (Signed)
Spoke to pt, notified her of Dr. Prescott Gum recommendation. She will try Diovan, rx sent to pharmacy. Pt will f/u to let me know how med is working for her.

## 2010-12-07 NOTE — Telephone Encounter (Signed)
Opened in error

## 2010-12-29 NOTE — Discharge Summary (Signed)
Suzanne [name], Suzanne [name] NO.:  [id number]   MEDICAL RECORD NO.:  [id number]          PATIENT TYPE:  INP   LOCATION:  5736                         FACILITY:  [facility]   PHYSICIAN:  Currie Paris, M.D.DATE OF BIRTH:  01-Jan-1956   DATE OF ADMISSION:  10/21/2006  DATE OF DISCHARGE:  10/22/2006                               DISCHARGE SUMMARY   FINAL DIAGNOSIS:  Recurrent right breast cancer.   CLINICAL HISTORY:  Ms. Fusilier is a 55 year old lady who had a right  breast cancer and underwent right mastectomy, axillary dissection and  transverse rectus abdominis myocutaneous flap several years ago.  She is  presented back with a right axillary metastasis as well as metastasis  under the TRAM flap.   HOSPITAL COURSE:  The patient was taken to the operating room and  underwent a right partial mastectomy to resect the local recurrence and  that included a portion of the pectoralis major muscle.  She also had  excision of a right axillary metastasis.  Radiation therapy catheters  were placed by Dr. Margaretmary Bayley at the time of surgery.   The patient tolerated the procedure well.  She had minimal pain other  than some soreness.  Her dressings were dry and she was ready to be  discharged the day following surgery.   Final pathology confirmed a right axillary mass with invasive  adenocarcinoma, a second lymph node that had metastatic carcinoma and a  2.8 cm lumpectomy with invasive carcinoma.  The ER was positive.  The PR  was positive.  The K-67 was 14%.  The HER2/neu was 2+ and the HER2/neu  by FISH showed no amplification.      Currie Paris, M.D.  Electronically Signed     CJS/MEDQ  D:  12/06/2006  T:  12/06/2006  Job:  [id number]   cc:   Pierce Crane, M.D.  Artist Pais Kathrynn Running, M.D.

## 2010-12-29 NOTE — Assessment & Plan Note (Signed)
[facility]                             PULMONARY OFFICE NOTE   NAME:DUKESLayton, Tappan                        MRN:          [id number]  DATE:09/24/2006                            DOB:          10-01-1955    This is a very complex 55 year old female who presents for followup on  dyspnea.  She also has numerous questions with regards to potential  biopsy of lung nodules and pleural base masses.  The patient has  metastatic breast cancer and has undergone chemotherapy, which she was  unable to tolerate.  This was with Gemzar.  She has had difficulties  with dyspnea which are a multifactorial combination, one being due to  paresis of the right hemidiaphragm.  This has been confirmed by sniff  test.  The patient also has mild airways reactivity and this is being  treated with Symbicort.  She has noted improvement in this regard.  She  continues to have difficulties with fatigue that is a residual from her  chemotherapy, but overall feels markedly better in this regard.  She  does have numerous questions today with regards to lung nodules that she  has had.  I have reviewed all of her CT scans as far back as May of  2007, and these appear stable.  In addition, she has pleural base  densities.  She wondered if these were amenable to diagnosis by  bronchoscopy.  We did discuss the best way to approach these would  probably be with needle aspirate to the pleural base lesions, and the  lung nodule are way too small to try to attempt to biopsy.  They do not  have any PET scan uptake, but are too small for this and will more than  likely need to be observed.  Short of open biopsy, these would not be  amenable for such.  We did have a long discussion with regards to this.  She had numerous questions, also has questions as to whether debulking  of the chest wall base mass and followup with radiation and chemo would  be the way to go.  I did advise her to follow the  recommendations by Dr.  Donnie Coffin and Dr. Kathrynn Running, who are her medical and radiation oncologists  respectively.   CURRENT MEDICATIONS:  As noted on the intake sheet.  These have been  reviewed, and are accurate.   PHYSICAL EXAMINATION:  VITAL SIGNS:  Are as noted.  Oxygen saturation is  96% on room air.  GENERAL:  This is a well-appearing female, who is in no acute distress.  HEENT:  Examination is unremarkable.  NECK:  Supple, no JVD noted.  LUNGS:  Clear to auscultation bilaterally.  She is moving air well.  She  does have diminished breath sounds on the right base which are chronic.  CARDIAC:  Regular rate and rhythm, no murmurs, rubs or gallops heard.  EXTREMITIES:  No cyanosis, no clubbing, no edema noted.  NEUROLOGIC:  Examination is grossly nonfocal.   We did spend 20 to 25 minutes discussing the best way to approach her  chest findings.   IMPRESSION:  1. Metastatic breast cancer lesions in the lung and chest wall, are      not amenable to diagnosis by bronchoscopy.  However, the pleural      base lesions may be amenable to diagnosis by fine needle aspiration      by Interventional Radiology.  2. Airways reactivity, mild asthma.  The patient is well controlled      with Symbicort.  3. Dyspnea, markedly improved.  4. Paresis of the right hemidiaphragm, which is multifactorial due to      prior extensive surgery for debulking of tumor and also in part due      to chemotherapeutic toxicity.   PLAN:  1. The plan therefore will be for the patient to reconvene with Dr.      Donnie Coffin and Dr. Kathrynn Running.  I do recommend that consideration be given      to consulting Dr. Irish Lack of Interventional Radiology to see      if a biopsy of one of the pleural base lesions is feasible.  2. Continue Symbicort as is.  3. Followup will be in 6 to 8 weeks' time.  She is to contact us prior      to that time should any new problems arise.     Gailen Shelter, MD  Electronically  Signed    CLG/MedQ  DD: 09/24/2006  DT: 09/24/2006  Job #: [id number]

## 2010-12-29 NOTE — Procedures (Signed)
[place]  Patient:    Suzanne [name], Suzanne [name]                        MRN: [id number] Proc. Date: 11/15/99 Adm. Date:  [id number] Attending:  Rich Brave CC:         Edwena Felty. Ashley Royalty, M.D.                           Procedure Report  PROCEDURE PERFORMED:  Colonoscopy with biopsies.  ENDOSCOPIST:  Florencia Reasons, M.D.  INDICATIONS FOR PROCEDURE:  The patient is a 55 year old with a history of cervical dysplasia, endometrial dysplasia or early cancer, and breast cancer, for colon cancer screening, especially in view of recent intermittent small volume hematochezia and intermittent nonspecific low abdominal pain with altered bowel habits suggestive of irritable bowel syndrome.  FINDINGS:  Normal exam to the terminal ileum.  DESCRIPTION OF PROCEDURE:  The nature, purpose and risks of the procedure had been discussed with the patient, who provided written consent.  Sedation was fentanyl 125 mct and Versed 15 mg IV without arrhythmias or desaturation.  The Olympus adjustable tension pediatric video colonoscope was easily advanced to the terminal ileum which had a normal appearance. Random mucosal biopsies were obtained and pullback was then performed.  The quality of the prep was very good and it is felt that all areas were adequately seen.  This was a normal examination.  No polyps, cancer, colitis, vascular malformations or diverticular disease were observed and retroflexion in the rectum was normal.  Random colonic mucosal biopsies were obtained during pullback.  Careful examination of the distal rectum disclosed no source of rectal bleeding such as proctitis, polyps or masses and pullout through the anal canal showed just small internal hemorrhoids, nothing impressive.  The patient tolerated the procedure well and there were no apparent complications.  IMPRESSION:  Normal colonoscopy.  PLAN:  Await pathology on the biopsies, which I  anticipate will be normal. Clinical follow up of nonspecific irritable bowel syndrome symptoms on a p.r.n. basis. DD:  11/15/99 TD:  11/15/99 Job: [id number]

## 2010-12-29 NOTE — Assessment & Plan Note (Signed)
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  She has a history of breast cancer and  recently has undergone placement of catheters for brachy therapy into  the lesions that were beneath the tram flap that she had done  previously.  The patient since then has felt dramatic improvement of her  dyspnea.  She also is being treated with radiation currently.  The  patient has been using Symbicort for mild airway reactivity and feels  that this is helping her.  She denies any other symptomatology at  present.  She does note that when she takes codeine her dyspnea worsens  somewhat.  I suspect that this is because of hypoventilation instigated  by the codeine.  The patient denies any other complaints.  She has had  no fevers, chills, or sweats, and no sputum production.   CURRENT MEDICATIONS:  As noted on the intake sheet.  These have been  reviewed and are accurate.   PHYSICAL EXAMINATION:  Vital signs noted.  Oxygen saturation is 97% on  room air.  IN GENERAL:  This is a well appearing female who is in no acute  distress.  She looks markedly better than during her last visit.  HEENT:  Unremarkable.  NECK:  Supple.  No adenopathy noted.  No JVD.  CHEST:  The patient has tram reconstruction on the right.  LUNGS:  Clear to auscultation bilaterally.  She does have decreased  excursion on her right diaphragm.  This is chronic.  ABDOMEN:  Obese, otherwise benign.  EXTREMITIES:  The patient has no cyanosis, clubbing or edema.  NEUROLOGIC EXAM:  Grossly nonfocal.   We did perform spirometry today which shows that the patient's mid flows  are actually at the level post bronchodilator on her prior PFT done  in  November of 2007.  Otherwise she does show evidence of moderate  restriction which unchanged from prior.   IMPRESSION:  1. Dyspnea which is multifactorial.  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  This is actually      better.  2. Mild airways reactivity well controlled on Symbicort.   PLAN:  For the patient to follow up in the earlier part of June.  After  that, I will set her to follow up p.r.n. or if further pulmonary  followup is necessary, I will set her up with Dr. Marcelyn Bruins as I will  be leaving the practice.     Gailen Shelter, MD  Electronically Signed    CLG/MedQ  DD: 12/02/2006  DT: 12/02/2006  Job #: [id number]

## 2010-12-29 NOTE — Op Note (Signed)
[place]  Patient:    Suzanne [name], Suzanne [name]                        MRN: [id number] Proc. Date: 02/07/00 Adm. 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  POSTOPERATIVE DIAGNOSIS:  Fistula in ano.  OPERATION PERFORMED:  Examination under anesthesia with fistulotomy.  SURGEON:  Currie Paris, M.D.  ANESTHESIA:  General.  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  DESCRIPTION OF PROCEDURE:  The patient was brought to the operating room and after satisfactory general anesthesia (LMA) was obtained, was placed in lithotomy positon.  The perianal area was prepped and draped.  Externally there was a little purulent drainage from the fistula.  Rectal exam was unremarkable.  Anoscopy initially revealed no gross abnormalities and minimal hemorrhoidal disease.  I was able to put a probe externally and get it almost into the anal area but not completely.  Despite several minutes of efforts, I was unable to find the internal opening either probing through the outside or from the inside although I saw one area in the intersphincteric area that I thought might represent the internal opening.  I tried injecting a little saline to see if I could see an internal opening with some saline dripping out but that was unsuccessful also.  At this point I inserted the straight probe from the outside opening as far in as it would go gently and then used the cautery to divide the tissue, dividing a little bit of sphincter muscle.  When I got down to the tip, there was a little bit deeper area that went back into the rectal area and again using an internal probe, I was now able to find the internal opening as a small dimpled area internally and put a  hook probe into that to communicate the two.  It went through the sphincter muscle and I divided the remaining superficial portion of the internal sphincter to complete the fistulotomy.  There appeared to be some of the deeper portion of the sphincter still intact.  At this point the fistulotomy was completed.  I then wiped the tract with some gauze to get rid of the old granulation tissue and then cauterized the base. I then injected the area with about 10 cc of 0.25% plain Marcaine with epinephrine for postoperative analgesia and tacked a little Gelfoam in it.  The patient tolerated the procedure well.  There were no operative complications.  All counts were correct. DD:  02/07/00 TD:  02/08/00 Job: [id number]

## 2010-12-29 NOTE — Op Note (Signed)
Suzanne [name], SOLANA NO.:  [id number]   MEDICAL RECORD NO.:  [id number]          PATIENT TYPE:  AMB   LOCATION:  DSC                          FACILITY:  [facility]   PHYSICIAN:  Currie Paris, M.D.DATE OF BIRTH:  11-07-1955   DATE OF PROCEDURE:  01/10/2006  DATE OF DISCHARGE:                                 OPERATIVE REPORT   PREOPERATIVE DIAGNOSIS:  Recurrent breast cancer.   POSTOPERATIVE DIAGNOSIS:  Recurrent breast cancer.   OPERATION:  Port-A-Cath placement.   SURGEON:  Currie Paris, M.D.   ANESTHESIA:  MAC.   MEDICAL HISTORY:  [name] is a 55 year old getting ready to have  chemotherapy.  She needed IV access for her chemo.   DESCRIPTION OF PROCEDURE:  The patient was seen in the holding area and she  had no further questions.  We confirmed Port-A-Cath placement as the planned  procedure.   The patient was taken to the operating room and given IV sedation.  She was  placed in Trendelenburg position.  The upper chest and lower neck were  prepped and draped as a sterile field.  The time-out occurred.   One percent Xylocaine plain was used for local.  It was infiltrated in the  left infraclavicular area.  Using the scar from a prior Port-A-Cath, I  entered the subclavian vein on the initial attempt and the guidewire  threaded easily and was positioned into the right atrium.   The old scar from her Port-A-Cath site was excised.  A pocket was fashioned  with the cautery.  This area was also thoroughly anesthetized.   The tubing was brought from the reservoir site into the guidewire site.  The  guidewire tract was dilated which went easily.  The guidewire and dilator  were removed and the catheter threaded in to approximately 22 cm.  The peel-  away sheath was removed.  The catheter aspirated and irrigated easily.  On  fluoro, however, it appeared that the catheter had gone across the midline  and up into the right IJ.  Using fluoro, the  catheter was backed out and  then advanced and threaded into near the junction the right atrial and  superior vena cava.  Again, it aspirated and irrigated easily.   The reservoir was flushed, attached and a locking mechanism engaged.  It  aspirated and irrigated easily.   A final fluoro was done and everything appeared to be in good position.  The  catheter was flushed a final time with dilute and concentrated aqueous  heparin.   Incision was closed with 3-0 Vicryl, 4-0 Monocryl subcuticular and  Dermabond.   The patient tolerated the procedure well and there no operative  complications.  All counts were correct.      Currie Paris, M.D.  Electronically Signed     CJS/MEDQ  D:  01/10/2006  T:  01/10/2006  Job:  [id number]

## 2010-12-29 NOTE — Assessment & Plan Note (Signed)
[facility]                             PULMONARY OFFICE NOTE   NAME:[name], Suzanne RABANAL                        MRN:          [id number]  DATE:06/27/2006                            DOB:          1956-04-18    This is a very unfortunate 55 year old female who presents for followup  and evaluation of dyspnea.  Since her last visit, we have obtained  results of a SNIFF test done at [facility].  This showed  that she basically has a paralyzed right hemidiaphragm.  In addition, we  did obtain pulmonary functions.  The patient did have a variation in the  supine versus sitting, vital capacity with significant drop.  This is  again consistent with the diaphragmatic dysfunction and paralysis .  Her  PFTs also did reveal the patient has minimal to mild obstructive defect.  Given the fact that she is so symptomatic with dyspnea, it is worthwhile  giving a trial of bronchodilators to see if this helps her.   Today the patient states that she does not feel well.  She apparently  had a fever last night to 100 degrees.  She denies any fevers currently.  She has had no cough, no sputum production, and denies any other  symptomatology.   CURRENT MEDICATIONS:  As noted on the intake sheet.  These have been  reviewed and are accurate.   PHYSICAL EXAMINATION:  VITAL SIGNS: As noted.  Oxygen saturation is 96%  on room air.  GENERAL:  This is a well-developed, somewhat overweight female who is in  no acute distress.  HEENT:  Examination is unremarkable for age.  NECK:  Supple.  No adenopathy noted, no JVD.  LUNGS: Clear to auscultation bilaterally with diminished breath sounds  in the right base, unchanged from prior.  CARDIAC: Regular rate and rhythm.  No rubs, murmurs, gallops heard.  EXTREMITIES: No cyanosis, no clubbing, no edema noted.   IMPRESSION:  1. Right hemidiaphragmatic paralysis.  2. Mild airways reactivity.   PLAN:  1. Treat the patient with  Symbicort 80/4.5 two inhalations b.i.d.  2. The patient was encouraged to use an Acapella flutter valve; she      was taught how to use this, instructions given.  3. The patient will also be advised to use an incentive spirometer q.      2 h p.r.n.  4. She is to see Dr. Donnie Coffin today [facility]  5. Followup will be in 6-8 weeks' time and to contact us prior to that      time should any problems arise.     Gailen Shelter, MD  Electronically Signed    CLG/MedQ  DD: 07/08/2006  DT: 07/08/2006  Job #: [id number]

## 2010-12-29 NOTE — Op Note (Signed)
[name], [name] NO.:  [id number]   MEDICAL RECORD NO.:  [id number]          PATIENT TYPE:  INP   LOCATION:  5736                         FACILITY:  [facility]   PHYSICIAN:  Currie Paris, M.D.DATE OF BIRTH:  Jun 08, 1956   DATE OF PROCEDURE:  10/21/2006  DATE OF DISCHARGE:                               OPERATIVE REPORT   PREOPERATIVE DIAGNOSES:  1. 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  2. Right axillary breast cancer metastasis.   POSTOPERATIVE DIAGNOSES:  1. 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  2. Right axillary breast cancer metastasis.   OPERATION:  1. Needle-guided right partial mastectomy with resection of a portion      of pectoralis major muscle.  2. Needle-guided excision of right axillary metastasis.  3. Placement of radiation therapy catheters (by Dr. Margaretmary [name]).   SURGEON:  Currie Paris, M.D.   ASSISTANT:  Suzanne [name], M.D.   ANESTHESIA:  General.   CLINICAL HISTORY:  This is a 55 year old lady who is approximately 12  years from a mastectomy with TRAM flap.  She was recently found to have  local recurrence under the TRAM as well as axillary mass recurrence.  She had been treated with chemotherapy and now comes to the operating  room for excision.  During her chemotherapy, she had developed a right  phrenic nerve paralysis and it was elected to limit her resection  somewhat because of that.  It was felt that we could get a wide local  excision and follow this with radiation therapy with also excision of  the axillary metastasis.  Because the breast recurrence was not palpable  whatsoever and the axillary area was only questionably palpable, we  elected to place guidewires preoperatively.   DESCRIPTION OF PROCEDURE:  The patient was seen in the holding area  and  she had no further questions.  I identified the right breast in  conjunction with the patient and we marked as the operative side.  She  already had 2 guidewires placed and I reviewed those films.   The patient was taken to the operating room and after satisfactory  general anesthesia had been obtained, the right breast area was prepped  and draped as a sterile field.  The time-out occurred.   I started by making an axillary incision over an X where the  radiologist had marked the location of the lesion and this was above the  guidewire.  As I got through a little of the subcutaneous tissue, I was  able then to manipulate the guidewire in and as I got a little deeper, I  could clearly feel an irregular mass and this was excised, going down to  chest wall and going in all directions around it.  Once this was out, I  felt a secondary more posterior mass; it may have been close or attached  to the primary and this was likewise excised.  I used some clips  on some  vessels and carefully palpated to make sure I could feel no other areas  of involvement and everything else appeared negative.  Dr. Maple [name] also  confirmed nothing was palpable to his exam either.  A small pack was  placed.   Attention was turned back to the breast and again, an X marked the spot  and the guidewire entered inferiorly and tracked deep and superior and  somewhat medial.  I made a curvilinear incision using the top scar from  her TRAM flap.  I divided about 2 cm of subcutaneous and breast and TRAM  tissue until I felt I was deep enough to try to manipulate the wire into  the wound, which I then did.  As I got this deep, I could then feel the  area of the tumor.   Using palpation as a guide, I went completely around the tumor and  excised it down to and initially including a little bit of the lateral  edge of the pectoralis muscle.  Once this was out, I inspected the  tissue and thought that I was completely  around it in all directions,  with the closest possibly being involved was deep.  There is a little  bit of muscle tissue left posteriorly, so I went back and excised that  down to chest wall, such I could do no further excision.   I irrigated and made sure everything was dry, spending several minutes  doing that.  I put some marking clips in to mark the margins of the  chest wall area that were really well around the margins of the tumor.   At this point, Dr. Kathrynn [name] scrubbed in and placed percutaneous catheters  for postoperative radiation therapy with the plan of hopefully doing CAT  scan of her tomorrow and starting her therapy Wednesday.   Once he left, I reirrigated and again made sure everything was dry.  I  injected 0.25% Marcaine to help with postop analgesia.  I closed the  incision in layers with 3-0 Vicryl, followed by 3-0 Monocryl  subcuticular and Dermabond.   Attention was turned back to the axillary incision and 1 small bleeding  point was identified and clipped.  I then irrigated and again made sure  everything was dry.  I injected some more Marcaine here and then closed  with 3-0 Vicryl and 4-0 Monocryl subcuticular plus Dermabond.   The patient tolerated the procedure well.  There were no operative  complications.  All counts were correct.  Estimated blood loss was well  under 100 mL and likely under 50.      Currie Paris, M.D.  Electronically Signed     CJS/MEDQ  D:  10/21/2006  T:  10/22/2006  Job:  [id number]   cc:   Suzanne [name], M.D.  Suzanne [name] Suzanne [name], M.D.

## 2010-12-29 NOTE — Assessment & Plan Note (Signed)
[facility]                             PULMONARY OFFICE NOTE   NAME:Suzanne [name]                        MRN:          [id number]  DATE:06/13/2006                            DOB:          04-Aug-1956    Suzanne [name] is a very unfortunate 54 year old female, critical care nurse at  [employer] who has been diagnosed for thirteen years with  carcinoma of the right breast.  The patient had a recurrence diagnosed  in May of this year.  She, since then, has undergone chemotherapy.  Prior to the chemotherapy, she developed some difficulties with dyspnea.  This was aggravated after her chemotherapy initiation.  She was on  Xeloda at 1 point, and currently she is unable to tell me the exact  chemotherapeutic agent, but she is on chemo.  She has developed  peripheral neuropathy and this has aggravated her ongoing issues with  dyspnea.  The patient denies any fevers, chills, or sweats.  She has had  no cough.  Activity makes her dyspnea worse.  Also, she does notice  worsening of dyspnea when she is recumbent.  She has had workup with CT  scans and labs.  None of these have been terribly revealing, according  to her.  The patient does have difficulties with gastroesophageal  reflux.  She is currently on Nexium for this and has noted that this  improves her gastroesophageal reflux symptoms.  The patient denies any  chest pain.   PAST MEDICAL HISTORY:  Remarkable for cancer of the breast.  She is  status post right mastectomy approximately 13 years ago.  She now is  noted to have recurrence on the same side.  Prior to that, she had a  very extensive surgery related to the carcinoma with TRAM flap surgery.  The patient is currently under the care of Dr. Donnie Coffin and is on  chemotherapy.   MEDICATION ALLERGIES:  None.  She is intolerant of PREDNISONE because of  side effects, but does not have a true allergy.   CURRENT MEDICATIONS:  Include warfarin 1 mg daily  to prevent clotting of  her Port-A-Cath.  She takes Nexium 40 mg daily.  P.r.n. medications  include Ativan, Lunesta or Ambien, Compazine, Zofran, dexamethasone, and  oxycodone.  In addition, the patient is on chemotherapy as prescribed by  Dr. Donnie Coffin.   SOCIAL HISTORY:  She has smoked off and on for years, but very rarely.  An occasional cigarette here and there.  She does not have heavy  ingestion of alcohol.  She is married and lives with her husband and  daughter.  She is a critical care nurse.  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  She has not had any exotic travels.   FAMILY HISTORY:  Noncontributory.   REVIEW OF SYSTEMS:  As noted above.  Otherwise, unremarkable.   PHYSICAL EXAM:  Blood pressure is 92/60, pulse 75, oxygen saturation 96%  on room air.  Temperature 98.6, weight 179 pounds.  GENERAL:  This is a somewhat overweight female who is in  no acute  distress.  HEENT:  Remarkable for alopecia.  NECK:  Supple.  No adenopathy noted.  No JVD.  LUNGS:  Remarkable for diminished breath sounds at the right base.  CARDIAC:  Regular rate and rhythm.  No rubs, murmurs, or gallops heard.  EXTREMITIES:  The patient has no cyanosis, no clubbing, no edema.  ABDOMEN:  Essentially unremarkable.   We did review her chest CTs and chest x-ray.  The most salient feature  of these is severe elevation of the right hemidiaphragm.   IMPRESSION:  Dyspnea.  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   PLAN:  1. Obtain PFTs with vital capacity while the patient is supine, as      well as in a sitting position.  The patient will also undergo      sniff testing to evaluate whether the patient's diaphragm is      indeed mobile.  2. Followup will be in 4 to 6 weeks' time.  She is to contact us prior      to that time  should any problems arise.     Suzanne Shelter, MD  Electronically Signed    CLG/MedQ  DD: 07/08/2006  DT: 07/08/2006  Job #: [id number]   cc:   Pierce Crane, M.D.

## 2011-01-04 ENCOUNTER — Other Ambulatory Visit: Payer: Self-pay | Admitting: Oncology

## 2011-01-04 ENCOUNTER — Ambulatory Visit (HOSPITAL_COMMUNITY)
Admission: RE | Admit: 2011-01-04 | Discharge: 2011-01-04 | Disposition: A | Discharge status: Home / Self Care | Payer: Medicare Other | Source: Ambulatory Visit | Attending: Oncology | Admitting: Oncology

## 2011-01-04 ENCOUNTER — Other Ambulatory Visit (HOSPITAL_COMMUNITY): Payer: Medicare Other

## 2011-01-04 ENCOUNTER — Encounter (HOSPITAL_COMMUNITY): Payer: Self-pay

## 2011-01-04 ENCOUNTER — Encounter (HOSPITAL_BASED_OUTPATIENT_CLINIC_OR_DEPARTMENT_OTHER): Payer: Medicare Other | Admitting: Oncology

## 2011-01-04 DIAGNOSIS — K7689 Other specified diseases of liver: Secondary | ICD-10-CM | POA: Insufficient documentation

## 2011-01-04 DIAGNOSIS — C773 Secondary and unspecified malignant neoplasm of axilla and upper limb lymph nodes: Secondary | ICD-10-CM

## 2011-01-04 DIAGNOSIS — C50919 Malignant neoplasm of unspecified site of unspecified female breast: Secondary | ICD-10-CM

## 2011-01-04 DIAGNOSIS — Z9010 Acquired absence of unspecified breast and nipple: Secondary | ICD-10-CM | POA: Insufficient documentation

## 2011-01-04 DIAGNOSIS — Y849 Medical procedure, unspecified as the cause of abnormal reaction of the patient, or of later complication, without mention of misadventure at the time of the procedure: Secondary | ICD-10-CM | POA: Insufficient documentation

## 2011-01-04 DIAGNOSIS — Z452 Encounter for adjustment and management of vascular access device: Secondary | ICD-10-CM

## 2011-01-04 DIAGNOSIS — IMO0002 Reserved for concepts with insufficient information to code with codable children: Secondary | ICD-10-CM | POA: Insufficient documentation

## 2011-01-04 HISTORY — DX: Malignant (primary) neoplasm, unspecified: C80.1

## 2011-01-04 LAB — COMPREHENSIVE METABOLIC PANEL
ALT: 139 U/L — ABNORMAL HIGH (ref 0–35)
AST: 95 U/L — ABNORMAL HIGH (ref 0–37)
Alkaline Phosphatase: 81 U/L (ref 39–117)
CO2: 26 mEq/L (ref 19–32)
Creatinine, Ser: 0.55 mg/dL (ref 0.40–1.20)
Sodium: 136 mEq/L (ref 135–145)
Total Bilirubin: 0.5 mg/dL (ref 0.3–1.2)
Total Protein: 7.2 g/dL (ref 6.0–8.3)

## 2011-01-04 LAB — CBC WITH DIFFERENTIAL/PLATELET
Basophils Absolute: 0 10*3/uL (ref 0.0–0.1)
EOS%: 1 % (ref 0.0–7.0)
HGB: 14.4 g/dL (ref 11.6–15.9)
MCH: 34.3 pg — ABNORMAL HIGH (ref 25.1–34.0)
MONO%: 10.8 % (ref 0.0–14.0)
NEUT#: 3.7 10*3/uL (ref 1.5–6.5)
RBC: 4.2 10*6/uL (ref 3.70–5.45)
RDW: 13 % (ref 11.2–14.5)
lymph#: 1.9 10*3/uL (ref 0.9–3.3)
nRBC: 0 % (ref 0–0)

## 2011-01-04 LAB — GLUCOSE, CAPILLARY: Glucose-Capillary: 116 mg/dL — ABNORMAL HIGH (ref 70–99)

## 2011-01-04 MED ORDER — FLUDEOXYGLUCOSE F - 18 (FDG) INJECTION: 19.0000 | Freq: Once | INTRAVENOUS | Status: DC | PRN

## 2011-01-04 MED ORDER — IOHEXOL 300 MG/ML  SOLN
100.0000 mL | Freq: Once | INTRAMUSCULAR | Status: AC | PRN
  Administered 2011-01-04: 100 mL via INTRAVENOUS

## 2011-01-11 ENCOUNTER — Other Ambulatory Visit: Payer: Self-pay | Admitting: Oncology

## 2011-01-11 ENCOUNTER — Encounter: Payer: Medicare Other | Admitting: Oncology

## 2011-01-11 DIAGNOSIS — C50919 Malignant neoplasm of unspecified site of unspecified female breast: Secondary | ICD-10-CM

## 2011-01-19 ENCOUNTER — Other Ambulatory Visit: Payer: Self-pay | Admitting: Internal Medicine

## 2011-02-16 ENCOUNTER — Other Ambulatory Visit: Payer: Self-pay | Admitting: Internal Medicine

## 2011-02-27 ENCOUNTER — Encounter: Payer: Self-pay | Admitting: Internal Medicine

## 2011-05-14 LAB — GLUCOSE, CAPILLARY: Glucose-Capillary: 99

## 2011-05-16 ENCOUNTER — Encounter (HOSPITAL_BASED_OUTPATIENT_CLINIC_OR_DEPARTMENT_OTHER): Payer: Medicare Other | Admitting: Oncology

## 2011-05-16 DIAGNOSIS — Z452 Encounter for adjustment and management of vascular access device: Secondary | ICD-10-CM

## 2011-05-16 DIAGNOSIS — C50919 Malignant neoplasm of unspecified site of unspecified female breast: Secondary | ICD-10-CM

## 2011-05-25 ENCOUNTER — Other Ambulatory Visit: Payer: Self-pay | Admitting: Internal Medicine

## 2011-07-10 ENCOUNTER — Other Ambulatory Visit (HOSPITAL_COMMUNITY): Payer: Medicare Other

## 2011-07-10 ENCOUNTER — Other Ambulatory Visit: Payer: Medicare Other | Admitting: Lab

## 2011-07-11 ENCOUNTER — Telehealth: Payer: Self-pay | Admitting: Oncology

## 2011-07-11 NOTE — Telephone Encounter (Signed)
called pt to r/s missed ct and pet scan appt and pt stated she cancel appts because she is not feeling good.  Pt also stated that she will call our office to r/s appts for Feb2013.  pt stated that she will have insurance at that time

## 2011-07-16 ENCOUNTER — Ambulatory Visit: Payer: Medicare Other | Admitting: Oncology

## 2011-07-18 ENCOUNTER — Telehealth: Payer: Self-pay | Admitting: *Deleted

## 2011-07-18 ENCOUNTER — Other Ambulatory Visit: Payer: Self-pay | Admitting: Internal Medicine

## 2011-07-18 ENCOUNTER — Other Ambulatory Visit (HOSPITAL_COMMUNITY): Payer: Self-pay | Admitting: Internal Medicine

## 2011-07-18 ENCOUNTER — Ambulatory Visit (HOSPITAL_COMMUNITY)
Admission: RE | Admit: 2011-07-18 | Discharge: 2011-07-18 | Disposition: A | Discharge status: Home / Self Care | Payer: Medicare Other | Source: Ambulatory Visit | Attending: Internal Medicine | Admitting: Internal Medicine

## 2011-07-18 DIAGNOSIS — R059 Cough, unspecified: Secondary | ICD-10-CM | POA: Insufficient documentation

## 2011-07-18 DIAGNOSIS — C801 Malignant (primary) neoplasm, unspecified: Secondary | ICD-10-CM

## 2011-07-18 DIAGNOSIS — R0902 Hypoxemia: Secondary | ICD-10-CM | POA: Insufficient documentation

## 2011-07-18 DIAGNOSIS — Z853 Personal history of malignant neoplasm of breast: Secondary | ICD-10-CM | POA: Insufficient documentation

## 2011-07-18 DIAGNOSIS — R05 Cough: Secondary | ICD-10-CM | POA: Insufficient documentation

## 2011-07-18 NOTE — Telephone Encounter (Signed)
gave patient appointment for flush on 07-18-2011 at 4:00pm patient confirmed in person

## 2011-07-19 ENCOUNTER — Other Ambulatory Visit (HOSPITAL_COMMUNITY): Payer: Medicare Other

## 2011-08-29 ENCOUNTER — Other Ambulatory Visit: Payer: Self-pay | Admitting: *Deleted

## 2011-08-29 ENCOUNTER — Ambulatory Visit (HOSPITAL_BASED_OUTPATIENT_CLINIC_OR_DEPARTMENT_OTHER): Payer: Medicare Other

## 2011-08-29 ENCOUNTER — Encounter: Payer: Self-pay | Admitting: Oncology

## 2011-08-29 DIAGNOSIS — D496 Neoplasm of unspecified behavior of brain: Secondary | ICD-10-CM

## 2011-08-29 MED ORDER — SODIUM CHLORIDE 0.9 % IJ SOLN
10.0000 mL | INTRAMUSCULAR | Status: DC | PRN
  Administered 2011-08-29: 10 mL via INTRAVENOUS
  Filled 2011-08-29: qty 10

## 2011-08-29 MED ORDER — HEPARIN SOD (PORK) LOCK FLUSH 100 UNIT/ML IV SOLN
500.0000 [IU] | Freq: Once | INTRAVENOUS | Status: AC
  Administered 2011-08-29: 500 [IU] via INTRAVENOUS
  Filled 2011-08-29: qty 5

## 2011-08-29 NOTE — Progress Notes (Signed)
Put patient's disability papers on md's desk.

## 2011-09-14 ENCOUNTER — Encounter: Payer: Self-pay | Admitting: Oncology

## 2011-09-14 NOTE — Progress Notes (Signed)
Faxed disability forms to [facility] @ [phone].

## 2011-11-16 ENCOUNTER — Other Ambulatory Visit (HOSPITAL_COMMUNITY): Payer: Self-pay | Admitting: Internal Medicine

## 2011-12-07 ENCOUNTER — Other Ambulatory Visit: Payer: Self-pay | Admitting: *Deleted

## 2011-12-07 DIAGNOSIS — C50919 Malignant neoplasm of unspecified site of unspecified female breast: Secondary | ICD-10-CM

## 2011-12-10 ENCOUNTER — Other Ambulatory Visit (HOSPITAL_COMMUNITY): Payer: Self-pay | Admitting: Internal Medicine

## 2011-12-12 ENCOUNTER — Encounter (HOSPITAL_COMMUNITY)
Admission: RE | Admit: 2011-12-12 | Discharge: 2011-12-12 | Disposition: A | Discharge status: Home / Self Care | Payer: Medicare Other | Source: Ambulatory Visit | Attending: Oncology | Admitting: Oncology

## 2011-12-12 ENCOUNTER — Encounter (HOSPITAL_COMMUNITY): Payer: Self-pay

## 2011-12-12 ENCOUNTER — Other Ambulatory Visit (HOSPITAL_BASED_OUTPATIENT_CLINIC_OR_DEPARTMENT_OTHER): Payer: Medicare Other | Admitting: Lab

## 2011-12-12 ENCOUNTER — Ambulatory Visit (HOSPITAL_COMMUNITY)
Admission: RE | Admit: 2011-12-12 | Discharge: 2011-12-12 | Disposition: A | Discharge status: Home / Self Care | Payer: Medicare Other | Source: Ambulatory Visit | Attending: Oncology | Admitting: Oncology

## 2011-12-12 DIAGNOSIS — Z9010 Acquired absence of unspecified breast and nipple: Secondary | ICD-10-CM | POA: Insufficient documentation

## 2011-12-12 DIAGNOSIS — Z923 Personal history of irradiation: Secondary | ICD-10-CM | POA: Insufficient documentation

## 2011-12-12 DIAGNOSIS — C50919 Malignant neoplasm of unspecified site of unspecified female breast: Secondary | ICD-10-CM | POA: Insufficient documentation

## 2011-12-12 DIAGNOSIS — K7689 Other specified diseases of liver: Secondary | ICD-10-CM | POA: Insufficient documentation

## 2011-12-12 DIAGNOSIS — Z90710 Acquired absence of both cervix and uterus: Secondary | ICD-10-CM | POA: Insufficient documentation

## 2011-12-12 DIAGNOSIS — R918 Other nonspecific abnormal finding of lung field: Secondary | ICD-10-CM | POA: Insufficient documentation

## 2011-12-12 DIAGNOSIS — Z79899 Other long term (current) drug therapy: Secondary | ICD-10-CM | POA: Insufficient documentation

## 2011-12-12 DIAGNOSIS — I517 Cardiomegaly: Secondary | ICD-10-CM | POA: Insufficient documentation

## 2011-12-12 DIAGNOSIS — K449 Diaphragmatic hernia without obstruction or gangrene: Secondary | ICD-10-CM | POA: Insufficient documentation

## 2011-12-12 DIAGNOSIS — R599 Enlarged lymph nodes, unspecified: Secondary | ICD-10-CM | POA: Insufficient documentation

## 2011-12-12 HISTORY — DX: Essential (primary) hypertension: I10

## 2011-12-12 LAB — CMP (CANCER CENTER ONLY)
ALT(SGPT): 134 U/L — ABNORMAL HIGH (ref 10–47)
AST: 104 U/L — ABNORMAL HIGH (ref 11–38)
Albumin: 3.8 g/dL (ref 3.3–5.5)
Alkaline Phosphatase: 83 U/L (ref 26–84)
Glucose, Bld: 112 mg/dL (ref 73–118)
Potassium: 4 mEq/L (ref 3.3–4.7)
Sodium: 135 mEq/L (ref 128–145)
Total Bilirubin: 1.2 mg/dl (ref 0.20–1.60)
Total Protein: 7.5 g/dL (ref 6.4–8.1)

## 2011-12-12 LAB — CANCER ANTIGEN 27.29: CA 27.29: 28 U/mL (ref 0–39)

## 2011-12-12 LAB — CBC WITH DIFFERENTIAL/PLATELET
BASO%: 0.8 % (ref 0.0–2.0)
EOS%: 1.9 % (ref 0.0–7.0)
HCT: 40.9 % (ref 34.8–46.6)
HGB: 14.1 g/dL (ref 11.6–15.9)
MCHC: 34.4 g/dL (ref 31.5–36.0)
MONO#: 0.6 10*3/uL (ref 0.1–0.9)
NEUT%: 52.7 % (ref 38.4–76.8)
RDW: 13.4 % (ref 11.2–14.5)
WBC: 5 10*3/uL (ref 3.9–10.3)
lymph#: 1.6 10*3/uL (ref 0.9–3.3)
nRBC: 0 % (ref 0–0)

## 2011-12-12 MED ORDER — IOHEXOL 300 MG/ML  SOLN
100.0000 mL | Freq: Once | INTRAMUSCULAR | Status: AC | PRN
  Administered 2011-12-12: 100 mL via INTRAVENOUS

## 2011-12-12 MED ORDER — FLUDEOXYGLUCOSE F - 18 (FDG) INJECTION
17.3000 | Freq: Once | INTRAVENOUS | Status: AC | PRN
  Administered 2011-12-12: 17.3 via INTRAVENOUS

## 2011-12-17 ENCOUNTER — Telehealth: Payer: Self-pay | Admitting: *Deleted

## 2011-12-17 NOTE — Telephone Encounter (Signed)
per md being out of the office on 12-18-2011 left voice message to inform the patient of the new date and time on 02-27-2012 at 1:30pm

## 2011-12-19 ENCOUNTER — Ambulatory Visit (HOSPITAL_BASED_OUTPATIENT_CLINIC_OR_DEPARTMENT_OTHER): Payer: Medicare Other

## 2011-12-19 ENCOUNTER — Ambulatory Visit: Payer: Medicare Other | Admitting: Oncology

## 2011-12-19 VITALS — BP 131/87 | HR 77 | Temp 97.8°F

## 2011-12-19 DIAGNOSIS — C50919 Malignant neoplasm of unspecified site of unspecified female breast: Secondary | ICD-10-CM

## 2011-12-19 DIAGNOSIS — Z452 Encounter for adjustment and management of vascular access device: Secondary | ICD-10-CM

## 2011-12-19 MED ORDER — SODIUM CHLORIDE 0.9 % IJ SOLN
10.0000 mL | INTRAMUSCULAR | Status: DC | PRN
  Administered 2011-12-19: 10 mL via INTRAVENOUS
  Filled 2011-12-19: qty 10

## 2011-12-19 MED ORDER — HEPARIN SOD (PORK) LOCK FLUSH 100 UNIT/ML IV SOLN
500.0000 [IU] | Freq: Once | INTRAVENOUS | Status: AC
  Administered 2011-12-19: 500 [IU] via INTRAVENOUS
  Filled 2011-12-19: qty 5

## 2011-12-19 NOTE — Patient Instructions (Signed)
Call MD for problems 

## 2012-01-03 ENCOUNTER — Ambulatory Visit (HOSPITAL_BASED_OUTPATIENT_CLINIC_OR_DEPARTMENT_OTHER): Payer: Medicare Other | Admitting: Oncology

## 2012-01-03 VITALS — BP 116/78 | HR 79 | Temp 98.1°F | Ht 65.0 in | Wt 185.2 lb

## 2012-01-03 DIAGNOSIS — R7989 Other specified abnormal findings of blood chemistry: Secondary | ICD-10-CM

## 2012-01-03 DIAGNOSIS — C50919 Malignant neoplasm of unspecified site of unspecified female breast: Secondary | ICD-10-CM

## 2012-01-03 MED ORDER — LETROZOLE 2.5 MG PO TABS: 2.5000 mg | ORAL_TABLET | Freq: Every day | ORAL | Status: AC

## 2012-01-03 MED ORDER — DOXYCYCLINE HYCLATE 100 MG PO TABS: 100.0000 mg | ORAL_TABLET | Freq: Two times a day (BID) | ORAL | Status: AC

## 2012-01-04 ENCOUNTER — Telehealth: Payer: Self-pay | Admitting: *Deleted

## 2012-01-04 NOTE — Telephone Encounter (Signed)
gave patient appointment for 03-2012 and flush appointment printed out calendar and gave to the patient on 01-03-2012

## 2012-01-10 ENCOUNTER — Other Ambulatory Visit: Payer: Self-pay | Admitting: Radiology

## 2012-01-13 ENCOUNTER — Encounter: Payer: Self-pay | Admitting: Oncology
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
## 2012-02-27 ENCOUNTER — Ambulatory Visit: Payer: Medicare Other | Admitting: Oncology

## 2012-04-01 ENCOUNTER — Ambulatory Visit (HOSPITAL_BASED_OUTPATIENT_CLINIC_OR_DEPARTMENT_OTHER): Payer: Medicare Other

## 2012-04-01 VITALS — BP 117/79 | HR 79 | Temp 98.8°F

## 2012-04-01 DIAGNOSIS — Z95828 Presence of other vascular implants and grafts: Secondary | ICD-10-CM

## 2012-04-01 DIAGNOSIS — C50919 Malignant neoplasm of unspecified site of unspecified female breast: Secondary | ICD-10-CM

## 2012-04-01 DIAGNOSIS — Z452 Encounter for adjustment and management of vascular access device: Secondary | ICD-10-CM

## 2012-04-01 MED ORDER — HEPARIN SOD (PORK) LOCK FLUSH 100 UNIT/ML IV SOLN
500.0000 [IU] | Freq: Once | INTRAVENOUS | Status: AC
  Administered 2012-04-01: 500 [IU] via INTRAVENOUS
  Filled 2012-04-01: qty 5

## 2012-04-01 MED ORDER — SODIUM CHLORIDE 0.9 % IJ SOLN
10.0000 mL | INTRAMUSCULAR | Status: DC | PRN
  Administered 2012-04-01: 10 mL via INTRAVENOUS
  Filled 2012-04-01: qty 10

## 2012-04-03 ENCOUNTER — Other Ambulatory Visit: Payer: Medicare Other | Admitting: Lab

## 2012-04-03 ENCOUNTER — Ambulatory Visit: Payer: Medicare Other | Admitting: Oncology

## 2012-05-01 ENCOUNTER — Other Ambulatory Visit: Payer: Self-pay | Admitting: Internal Medicine

## 2012-05-05 ENCOUNTER — Other Ambulatory Visit: Payer: Self-pay | Admitting: *Deleted

## 2012-05-05 MED ORDER — VALSARTAN 40 MG PO TABS: 40.0000 mg | ORAL_TABLET | Freq: Two times a day (BID) | ORAL | Status: DC

## 2012-05-05 NOTE — Telephone Encounter (Signed)
Spoke to patient and she is aware that she has to make an appointment in order to get more refills. Pt states she will call back to make that appointment. Will fill patients Diovan for 60 tablets with 0 refills. Pt needs appointment then refill can be made  

## 2012-05-05 NOTE — Telephone Encounter (Signed)
Spoke to patient and she is aware that she has to make an appointment in order to get more refills. Pt states she will call back to make that appointment. Will fill patients Diovan for 60 tablets with 0 refills. Pt needs appointment then refill can be made

## 2012-05-21 ENCOUNTER — Emergency Department (HOSPITAL_COMMUNITY)
Admission: EM | Admit: 2012-05-21 | Discharge: 2012-05-22 | Disposition: A | Discharge status: Other Acute Inpatient Hospital | Payer: Medicare Other | Attending: Emergency Medicine | Admitting: Emergency Medicine

## 2012-05-21 ENCOUNTER — Encounter (HOSPITAL_COMMUNITY): Payer: Self-pay | Admitting: Emergency Medicine

## 2012-05-21 DIAGNOSIS — I10 Essential (primary) hypertension: Secondary | ICD-10-CM | POA: Insufficient documentation

## 2012-05-21 DIAGNOSIS — Z853 Personal history of malignant neoplasm of breast: Secondary | ICD-10-CM | POA: Insufficient documentation

## 2012-05-21 DIAGNOSIS — Z79899 Other long term (current) drug therapy: Secondary | ICD-10-CM | POA: Insufficient documentation

## 2012-05-21 DIAGNOSIS — F1010 Alcohol abuse, uncomplicated: Secondary | ICD-10-CM | POA: Insufficient documentation

## 2012-05-21 HISTORY — DX: Other disorders of lung: J98.4

## 2012-05-21 HISTORY — DX: Presence of coronary angioplasty implant and graft: Z95.5

## 2012-05-21 LAB — HEPATIC FUNCTION PANEL
Albumin: 3.9 g/dL (ref 3.5–5.2)
Total Bilirubin: 0.3 mg/dL (ref 0.3–1.2)
Total Protein: 7.6 g/dL (ref 6.0–8.3)

## 2012-05-21 LAB — ETHANOL: Alcohol, Ethyl (B): 179 mg/dL — ABNORMAL HIGH (ref 0–11)

## 2012-05-21 LAB — CBC WITH DIFFERENTIAL/PLATELET
Basophils Absolute: 0.1 10*3/uL (ref 0.0–0.1)
HCT: 43.2 % (ref 36.0–46.0)
Lymphs Abs: 2.1 10*3/uL (ref 0.7–4.0)
MCH: 35.7 pg — ABNORMAL HIGH (ref 26.0–34.0)
MCHC: 36.3 g/dL — ABNORMAL HIGH (ref 30.0–36.0)
MCV: 98.2 fL (ref 78.0–100.0)
Monocytes Absolute: 0.7 10*3/uL (ref 0.1–1.0)
Monocytes Relative: 13 % — ABNORMAL HIGH (ref 3–12)
Neutro Abs: 2.1 10*3/uL (ref 1.7–7.7)
Platelets: 238 10*3/uL (ref 150–400)
RDW: 12.5 % (ref 11.5–15.5)

## 2012-05-21 LAB — BASIC METABOLIC PANEL
BUN: 12 mg/dL (ref 6–23)
Calcium: 9.6 mg/dL (ref 8.4–10.5)
Chloride: 102 mEq/L (ref 96–112)
Creatinine, Ser: 0.6 mg/dL (ref 0.50–1.10)
GFR calc Af Amer: >90 mL/min (ref >90)

## 2012-05-21 LAB — RAPID URINE DRUG SCREEN, HOSP PERFORMED
Amphetamines: NOT DETECTED
Cocaine: NOT DETECTED
Opiates: NOT DETECTED
Tetrahydrocannabinol: NOT DETECTED

## 2012-05-21 MED ORDER — CHLORDIAZEPOXIDE HCL 25 MG PO CAPS
25.0000 mg | ORAL_CAPSULE | Freq: Four times a day (QID) | ORAL | Status: DC
  Administered 2012-05-22 (×2): 25 mg via ORAL
  Filled 2012-05-21 (×2): qty 1

## 2012-05-21 MED ORDER — ONDANSETRON 4 MG PO TBDP
4.0000 mg | ORAL_TABLET | Freq: Four times a day (QID) | ORAL | Status: DC | PRN
Start: 2012-05-21 — End: 2012-05-22
  Administered 2012-05-22: 4 mg via ORAL
  Filled 2012-05-21: qty 1

## 2012-05-21 MED ORDER — ALUM & MAG HYDROXIDE-SIMETH 200-200-20 MG/5ML PO SUSP: 30.0000 mL | ORAL | Status: DC | PRN

## 2012-05-21 MED ORDER — LORAZEPAM 1 MG PO TABS
2.0000 mg | ORAL_TABLET | Freq: Every evening | ORAL | Status: DC | PRN
  Administered 2012-05-21: 2 mg via ORAL
  Filled 2012-05-21: qty 4

## 2012-05-21 MED ORDER — CHLORDIAZEPOXIDE HCL 25 MG PO CAPS: 25.0000 mg | ORAL_CAPSULE | ORAL | Status: DC

## 2012-05-21 MED ORDER — VITAMIN D3 25 MCG (1000 UNIT) PO TABS
1000.0000 [IU] | ORAL_TABLET | Freq: Every day | ORAL | Status: DC
  Administered 2012-05-21 – 2012-05-22 (×2): 1000 [IU] via ORAL
  Filled 2012-05-21 (×2): qty 1

## 2012-05-21 MED ORDER — VITAMIN B-1 100 MG PO TABS
100.0000 mg | ORAL_TABLET | Freq: Every day | ORAL | Status: DC
  Administered 2012-05-22: 100 mg via ORAL
  Filled 2012-05-21 (×2): qty 1

## 2012-05-21 MED ORDER — ADULT MULTIVITAMIN W/MINERALS CH
1.0000 | ORAL_TABLET | Freq: Every day | ORAL | Status: DC
  Administered 2012-05-22: 1 via ORAL
  Filled 2012-05-21: qty 1

## 2012-05-21 MED ORDER — CALCIUM CARBONATE-VITAMIN D 500-200 MG-UNIT PO TABS
2.0000 | ORAL_TABLET | Freq: Every day | ORAL | Status: DC
  Administered 2012-05-21 – 2012-05-22 (×2): 2 via ORAL
  Filled 2012-05-21 (×3): qty 2

## 2012-05-21 MED ORDER — CHLORDIAZEPOXIDE HCL 25 MG PO CAPS: 25.0000 mg | ORAL_CAPSULE | Freq: Every day | ORAL | Status: DC

## 2012-05-21 MED ORDER — CHLORDIAZEPOXIDE HCL 25 MG PO CAPS: 25.0000 mg | ORAL_CAPSULE | Freq: Four times a day (QID) | ORAL | Status: DC | PRN

## 2012-05-21 MED ORDER — CHLORDIAZEPOXIDE HCL 25 MG PO CAPS: 25.0000 mg | ORAL_CAPSULE | Freq: Three times a day (TID) | ORAL | Status: DC

## 2012-05-21 MED ORDER — CARVEDILOL 6.25 MG PO TABS
6.2500 mg | ORAL_TABLET | Freq: Once | ORAL | Status: AC
  Administered 2012-05-22: 6.25 mg via ORAL
  Filled 2012-05-21 (×2): qty 1

## 2012-05-21 MED ORDER — IRBESARTAN 75 MG PO TABS
75.0000 mg | ORAL_TABLET | Freq: Every day | ORAL | Status: DC
  Administered 2012-05-21 – 2012-05-22 (×2): 75 mg via ORAL
  Filled 2012-05-21 (×2): qty 1

## 2012-05-21 MED ORDER — CHLORDIAZEPOXIDE HCL 25 MG PO CAPS: 50.0000 mg | ORAL_CAPSULE | Freq: Once | ORAL | Status: DC

## 2012-05-21 MED ORDER — IRBESARTAN 75 MG PO TABS: 37.5000 mg | ORAL_TABLET | Freq: Every day | ORAL | Status: DC

## 2012-05-21 MED ORDER — LOPERAMIDE HCL 2 MG PO CAPS: 2.0000 mg | ORAL_CAPSULE | ORAL | Status: DC | PRN

## 2012-05-21 MED ORDER — HYDROXYZINE HCL 25 MG PO TABS
25.0000 mg | ORAL_TABLET | Freq: Four times a day (QID) | ORAL | Status: DC | PRN
  Administered 2012-05-22 (×2): 25 mg via ORAL
  Filled 2012-05-21 (×2): qty 1

## 2012-05-21 MED ORDER — LETROZOLE 2.5 MG PO TABS
2.5000 mg | ORAL_TABLET | Freq: Every day | ORAL | Status: DC
  Administered 2012-05-22: 2.5 mg via ORAL
  Filled 2012-05-21 (×2): qty 1

## 2012-05-21 MED ORDER — THIAMINE HCL 100 MG/ML IJ SOLN: 100.0000 mg | Freq: Once | INTRAMUSCULAR | Status: DC

## 2012-05-21 MED ORDER — CALCIUM CARBONATE-VITAMIN D 600-400 MG-UNIT PO TABS: 2.0000 | ORAL_TABLET | Freq: Every day | ORAL | Status: DC

## 2012-05-21 MED ORDER — LORATADINE 10 MG PO TABS
10.0000 mg | ORAL_TABLET | Freq: Every day | ORAL | Status: DC
  Administered 2012-05-22: 10 mg via ORAL
  Filled 2012-05-21 (×2): qty 1

## 2012-05-21 NOTE — ED Notes (Signed)
Patient very upset about medications.  Patient requesting coreg to be ordered as well as ativan.  Patient reports these to be her home medications.  Patient is speaking in a very loud tone to this RN.  Apologies were made.  Patient informed that this RN will communicate with provider.

## 2012-05-21 NOTE — ED Notes (Signed)
Patient denies SI or HI.  Patient denies auditory or visual hallucinations.  Patient endorses anxiety.  Patient is resting in bed but is somewhat restless.  Patient continues to walk up to the nurses station.  Patient was provided food and beverage. Plan of care discussed with patient.  Patient verbalized understanding.

## 2012-05-21 NOTE — ED Notes (Signed)
Patient requesting detox from ETOH all types states "drinks a lot" and uses ativan for sleep.  Denies SI/HI.

## 2012-05-21 NOTE — ED Notes (Signed)
Steward Drone, RN to call back for report.

## 2012-05-21 NOTE — ED Notes (Signed)
When this RN was back in the room with patient, patient informed this RN that the doctor prescribed the librium to replace the ativan.  This was not mentioned by the patient prior to discussion to order the ativan.  This RN communicated with the provider and the nurse receiving the patient.  Provider to discontinue ativan order and continue with current librium order.

## 2012-05-21 NOTE — ED Notes (Signed)
Patient walked up to the nurse's station and reported "my arms are shaking".  No tremors noted when patient back in room in the bed.

## 2012-05-21 NOTE — ED Notes (Signed)
Steward Drone to call back for report.

## 2012-05-21 NOTE — ED Notes (Signed)
Urine sent to lab 
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
## 2012-05-21 NOTE — ED Notes (Signed)
Communicated with provider and Coreg and Ativan will be ordered for patient. Communicated this to patient.  Patient verbalized understanding and is more calm at this time.

## 2012-05-21 NOTE — ED Notes (Signed)
Patient continues to walk up to the nurses station requesting items from her belongings.  Policies reviewed patient regarding her belongings.  Patient verbalized understanding.  Patient is resting in bed with NO tremors noted. Patient does appear somewhat restless sitting in the room but is easily redirected.

## 2012-05-22 ENCOUNTER — Inpatient Hospital Stay (HOSPITAL_COMMUNITY)
Admission: AD | Admit: 2012-05-22 | Discharge: 2012-05-26 | DRG: 897 | Disposition: A | Discharge status: Home / Self Care | Payer: 59 | Source: Ambulatory Visit | Attending: Psychiatry | Admitting: Psychiatry

## 2012-05-22 ENCOUNTER — Encounter (HOSPITAL_COMMUNITY): Payer: Self-pay | Admitting: *Deleted

## 2012-05-22 DIAGNOSIS — F411 Generalized anxiety disorder: Secondary | ICD-10-CM | POA: Diagnosis present

## 2012-05-22 DIAGNOSIS — Z853 Personal history of malignant neoplasm of breast: Secondary | ICD-10-CM

## 2012-05-22 DIAGNOSIS — Z79899 Other long term (current) drug therapy: Secondary | ICD-10-CM

## 2012-05-22 DIAGNOSIS — Z9861 Coronary angioplasty status: Secondary | ICD-10-CM

## 2012-05-22 DIAGNOSIS — F1320 Sedative, hypnotic or anxiolytic dependence, uncomplicated: Principal | ICD-10-CM

## 2012-05-22 DIAGNOSIS — F1010 Alcohol abuse, uncomplicated: Secondary | ICD-10-CM

## 2012-05-22 DIAGNOSIS — R51 Headache: Secondary | ICD-10-CM | POA: Diagnosis not present

## 2012-05-22 DIAGNOSIS — F1020 Alcohol dependence, uncomplicated: Secondary | ICD-10-CM | POA: Diagnosis present

## 2012-05-22 DIAGNOSIS — Z888 Allergy status to other drugs, medicaments and biological substances status: Secondary | ICD-10-CM

## 2012-05-22 DIAGNOSIS — I10 Essential (primary) hypertension: Secondary | ICD-10-CM | POA: Diagnosis present

## 2012-05-22 DIAGNOSIS — T4275XA Adverse effect of unspecified antiepileptic and sedative-hypnotic drugs, initial encounter: Secondary | ICD-10-CM | POA: Diagnosis not present

## 2012-05-22 MED ORDER — ADULT MULTIVITAMIN W/MINERALS CH
1.0000 | ORAL_TABLET | Freq: Every day | ORAL | Status: DC
  Administered 2012-05-23 – 2012-05-26 (×4): 1 via ORAL
  Filled 2012-05-22 (×5): qty 1

## 2012-05-22 MED ORDER — VITAMIN B-1 100 MG PO TABS
100.0000 mg | ORAL_TABLET | Freq: Every day | ORAL | Status: DC
  Administered 2012-05-23 – 2012-05-26 (×4): 100 mg via ORAL
  Filled 2012-05-22 (×5): qty 1

## 2012-05-22 MED ORDER — LOPERAMIDE HCL 2 MG PO CAPS: 2.0000 mg | ORAL_CAPSULE | ORAL | Status: AC | PRN

## 2012-05-22 MED ORDER — HYDROXYZINE HCL 25 MG PO TABS
25.0000 mg | ORAL_TABLET | Freq: Four times a day (QID) | ORAL | Status: AC | PRN
  Administered 2012-05-22 – 2012-05-24 (×4): 25 mg via ORAL
  Filled 2012-05-22: qty 1

## 2012-05-22 MED ORDER — ACETAMINOPHEN 325 MG PO TABS: 650.0000 mg | ORAL_TABLET | Freq: Four times a day (QID) | ORAL | Status: DC | PRN

## 2012-05-22 MED ORDER — ONDANSETRON 4 MG PO TBDP: 4.0000 mg | ORAL_TABLET | Freq: Four times a day (QID) | ORAL | Status: AC | PRN

## 2012-05-22 MED ORDER — CARVEDILOL 6.25 MG PO TABS
6.2500 mg | ORAL_TABLET | Freq: Two times a day (BID) | ORAL | Status: DC
  Filled 2012-05-22 (×2): qty 1

## 2012-05-22 MED ORDER — ALUM & MAG HYDROXIDE-SIMETH 200-200-20 MG/5ML PO SUSP: 30.0000 mL | ORAL | Status: DC | PRN

## 2012-05-22 MED ORDER — IBUPROFEN 200 MG PO TABS
600.0000 mg | ORAL_TABLET | Freq: Four times a day (QID) | ORAL | Status: DC | PRN
  Administered 2012-05-22: 600 mg via ORAL
  Filled 2012-05-22: qty 3

## 2012-05-22 MED ORDER — CHLORDIAZEPOXIDE HCL 25 MG PO CAPS
25.0000 mg | ORAL_CAPSULE | Freq: Four times a day (QID) | ORAL | Status: AC | PRN
  Administered 2012-05-22 – 2012-05-25 (×9): 25 mg via ORAL
  Filled 2012-05-22 (×10): qty 1

## 2012-05-22 MED ORDER — CHLORDIAZEPOXIDE HCL 25 MG PO CAPS
50.0000 mg | ORAL_CAPSULE | Freq: Once | ORAL | Status: AC
  Administered 2012-05-22: 50 mg via ORAL

## 2012-05-22 MED ORDER — THIAMINE HCL 100 MG/ML IJ SOLN
100.0000 mg | Freq: Once | INTRAMUSCULAR | Status: AC
  Administered 2012-05-22: 100 mg via INTRAMUSCULAR

## 2012-05-22 MED ORDER — CARBAMAZEPINE 200 MG PO TABS
200.0000 mg | ORAL_TABLET | Freq: Two times a day (BID) | ORAL | Status: DC
  Filled 2012-05-22: qty 1

## 2012-05-22 MED ORDER — MAGNESIUM HYDROXIDE 400 MG/5ML PO SUSP: 30.0000 mL | Freq: Every day | ORAL | Status: DC | PRN

## 2012-05-22 MED ORDER — CHLORDIAZEPOXIDE HCL 25 MG PO CAPS
ORAL_CAPSULE | ORAL | Status: AC
  Filled 2012-05-22: qty 2

## 2012-05-22 MED ORDER — CARBAMAZEPINE 200 MG PO TABS
200.0000 mg | ORAL_TABLET | Freq: Four times a day (QID) | ORAL | Status: AC
  Administered 2012-05-23 – 2012-05-24 (×7): 200 mg via ORAL
  Filled 2012-05-22 (×11): qty 1

## 2012-05-22 MED ORDER — VITAMIN B-1 100 MG PO TABS
100.0000 mg | ORAL_TABLET | Freq: Once | ORAL | Status: AC
  Administered 2012-05-22: 100 mg via ORAL

## 2012-05-22 MED ORDER — CARBAMAZEPINE 200 MG PO TABS
200.0000 mg | ORAL_TABLET | Freq: Three times a day (TID) | ORAL | Status: DC
  Administered 2012-05-25: 200 mg via ORAL
  Filled 2012-05-22 (×6): qty 1

## 2012-05-22 NOTE — Progress Notes (Signed)
2:35 PM Pt accepted for transfer to Behavioral Health by Dr. Dan Humphreys.

## 2012-05-22 NOTE — ED Notes (Signed)
First meeting with patient. Patient resting while watching TV with NAD. Waiting breakfast tray

## 2012-05-22 NOTE — Progress Notes (Signed)
Patient ID: Suzanne [name], female   DOB: August 20, 1955, 56 y.o.   MRN: [id number] D: Pt. Lying in bed, reports "feel shaky" "I been taking Ativan since the '90's and haven't had any." A: Writer assured pt. She would call on call doctor and check on meds. Pt. Will be monitored q105min for safety. R: Pt. Is safe on the unit. Writer spoke to doctor Puthuvel about meds, administered accordingly.

## 2012-05-22 NOTE — ED Notes (Signed)
Pt sleeping in room
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
## 2012-05-22 NOTE — BH Assessment (Signed)
Assessment Note  Update:  Received call from [facility] stating pt accepted Dr. Lucianne Muss to Dr. Daleen Bo to bed 300-1 and that pt could be transported to [facility].  Updated EDP Ignacia Palma and ED staff.  Completed support paperwork, updated assessment disposition, completed assessment notification and faxed to [facility] to log.  ED staff to arrange transport to [facility] as pt is voluntary.     Disposition:  Disposition Disposition of Patient: Inpatient treatment program Type of inpatient treatment program: Adult (Pt accepted [facility])  On Site Evaluation by:   Reviewed with Physician:  Bryson Ha, Rennis Harding 05/22/2012 2:22 PM
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
## 2012-05-22 NOTE — ED Notes (Signed)
PT pacing hallways.  Wanting something for anxiety.  Will re-assess CIWA.

## 2012-05-22 NOTE — ED Notes (Signed)
ACT team at bedside.  

## 2012-05-22 NOTE — Progress Notes (Signed)
Patient admitted this afternoon to [facility], first admission.  Patient denied SI and HI.   Denied A/V hallucinations.   Denied pain.  Stated she is not experiencing any problems at this time.   Went to dining room for dinner.  Has been pleasant and cooperative.

## 2012-05-23 ENCOUNTER — Encounter (HOSPITAL_COMMUNITY): Payer: Self-pay | Admitting: Physician Assistant

## 2012-05-23 MED ORDER — CARVEDILOL 6.25 MG PO TABS
6.2500 mg | ORAL_TABLET | Freq: Two times a day (BID) | ORAL | Status: DC
  Administered 2012-05-23 – 2012-05-26 (×6): 6.25 mg via ORAL
  Filled 2012-05-23 (×8): qty 1

## 2012-05-23 MED ORDER — IBUPROFEN 600 MG PO TABS
ORAL_TABLET | ORAL | Status: AC
  Filled 2012-05-23: qty 1

## 2012-05-23 MED ORDER — IBUPROFEN 600 MG PO TABS
600.0000 mg | ORAL_TABLET | Freq: Four times a day (QID) | ORAL | Status: DC | PRN
  Administered 2012-05-23 – 2012-05-25 (×5): 600 mg via ORAL
  Filled 2012-05-23 (×5): qty 1

## 2012-05-23 MED ORDER — MIRTAZAPINE 15 MG PO TABS
7.5000 mg | ORAL_TABLET | Freq: Every evening | ORAL | Status: DC | PRN
  Administered 2012-05-23: 7.5 mg via ORAL
  Filled 2012-05-23 (×3): qty 1

## 2012-05-23 MED ORDER — IRBESARTAN 75 MG PO TABS
75.0000 mg | ORAL_TABLET | Freq: Every day | ORAL | Status: DC
  Administered 2012-05-23 – 2012-05-26 (×4): 75 mg via ORAL
  Filled 2012-05-23 (×5): qty 1

## 2012-05-23 MED ORDER — LETROZOLE 2.5 MG PO TABS
2.5000 mg | ORAL_TABLET | Freq: Every day | ORAL | Status: DC
  Administered 2012-05-23 – 2012-05-26 (×4): 2.5 mg via ORAL
  Filled 2012-05-23 (×5): qty 1

## 2012-05-23 NOTE — Progress Notes (Signed)
Pt attended discharge planning group and actively participated in group.  SW provided pt with today's workbook.  Pt presents with flat affect and depressed mood.  Pt denies having depression and anxiety today.  Pt denies SI/HI.  Pt states that she is just tired today.  Pt was open with sharing reason for entering the hospital.  Pt states that she is here to detox off of alcohol.  Pt states that she was also abusing Ativan that was prescribed.  Pt states that she doesn't want rehab after detox from here.  Pt states that she lives in [place] with her husband and daughter.  Pt states that she has transportation home. SW will assess for appropriate referrals.  No further needs voiced by pt at this time.  Safety planning and suicide prevention discussed.  Pt participated in discussion and acknowledged an understanding of the information provided.       Reyes Ivan, LCSWA 05/23/2012  10:33 AM

## 2012-05-23 NOTE — H&P (Signed)
Patient seen and assessed. Agree with above assessment and recommendations. Restart home medications, Femara, Cardiovil and diovan.

## 2012-05-23 NOTE — BHH Suicide Risk Assessment (Signed)
Suicide Risk Assessment  Admission Assessment     Nursing information obtained from:  Patient Demographic factors:  Gay, lesbian, or bisexual orientation;Unemployed Current Mental Status:  NA Loss Factors:  Decline in physical health Historical Factors:  Family history of mental illness or substance abuse Risk Reduction Factors:  Sense of responsibility to family;Living with another person, especially a relative;Positive social support;Positive therapeutic relationship  CLINICAL FACTORS:   Alcohol Abuse, Benzodiazepine Abuse  COGNITIVE FEATURES THAT CONTRIBUTE TO RISK:  Thought constriction (tunnel vision)    SUICIDE RISK:   Mild:  Suicidal ideation of limited frequency, intensity, duration, and specificity.  There are no identifiable plans, no associated intent, mild dysphoria and related symptoms, good self-control (both objective and subjective assessment), few other risk factors, and identifiable protective factors, including available and accessible social support.  PLAN OF CARE: Initiate Librium protocol for alcohol detox. Continue Tegretol for seizure precaution.  Suzanne [name] 05/23/2012, 9:46 AM
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
## 2012-05-23 NOTE — Progress Notes (Signed)
Patient did not attend the evening karaoke group. Patient was admitted only a few hours earlier and was resting in bed.

## 2012-05-23 NOTE — Progress Notes (Signed)
Psychoeducational Group Note  Date:  05/23/2012 Time:  1100  Group Topic/Focus:  Relapse Prevention Planning:   The focus of this group is to define relapse and discuss the need for planning to combat relapse.  Participation Level:  Minimal  Participation Quality:  Appropriate, Attentive and Resistant  Affect:  Appropriate and Flat  Cognitive:  Alert and Appropriate  Insight:  Limited  Engagement in Group:  Limited  Additional Comments:   Pt attended and participated in Relapse Prevention Plan group. Pt was resistant towards group participation.   Dalia Heading 05/23/2012, 5:53 PM

## 2012-05-23 NOTE — BHH Counselor (Signed)
Adult Comprehensive Assessment  Patient ID: Suzanne [name], female   DOB: 23-Jan-1956, 56 y.o.   MRN: [id number]  Information Source: Information source: Patient  Current Stressors:  Educational / Learning stressors: NA Employment / Job issues: NA Family Relationships: Some strain with daughter  Surveyor, quantity / Lack of resources (include bankruptcy): NA Housing / Lack of housing: NA Physical health (include injuries & life threatening diseases): One lung, Mastectomy, Port a Cath Social relationships: NA, good network Substance abuse: Current Bereavement / Loss: NA  Living/Environment/Situation:  Living Arrangements: Spouse/significant other;Children Living conditions (as described by patient or guardian): Comfortable, [place]  How long has patient lived in current situation?: 19 years What is atmosphere in current home: Comfortable;Loving  Family History:  Marital status: Married Number of Years Married: 29-Jan-2023  What types of issues is patient dealing with in the relationship?: None Additional relationship information: good solid marriage Does patient have children?: Yes How many children?: 2  How is patient's relationship with their children?: Good basically, "some squabbling with daughter"  Childhood History:  By whom was/is the patient raised?: Both parents Additional childhood history information: Dad was strick but basically great family life Description of patient's relationship with caregiver when they were a child:  Good with both Patient's description of current relationship with people who raised him/her: Very good with mother; father deceased 01/28/2002 Does patient have siblings?: Yes Number of Siblings: 2  Description of patient's current relationship with siblings: Real good with one brother; little contact with other Did patient suffer any verbal/emotional/physical/sexual abuse as a child?: No Did patient suffer from severe childhood neglect?: No Has patient ever been  sexually abused/assaulted/raped as an adolescent or adult?: No Was the patient ever a victim of a crime or a disaster?: No Witnessed domestic violence?: No Has patient been effected by domestic violence as an adult?: No  Education:  Highest grade of school patient has completed: 16 Currently a student?: No Learning disability?: No  Employment/Work Situation:   Employment situation: On disability Why is patient on disability: Breast Cancer How long has patient been on disability: 2005-01-28 Patient's job has been impacted by current illness: No What is the longest time patient has a held a job?: 20 Years  Where was the patient employed at that time?: [place] Has patient ever been in the [employer]?: No Has patient ever served in Buyer, retail?: No  Financial Resources:   Surveyor, quantity resources: Support from parents / caregiver;Receives SSDI;Medicaid  Alcohol/Substance Abuse:   What has been your use of drugs/alcohol within the last 12 months?: one bottle or more wine daily, increase in last two months and Ativan since January 28, 1993 noy up to sometimes 4 daily If attempted suicide, did drugs/alcohol play a role in this?:  (No Attempt) Alcohol/Substance Abuse Treatment Hx: Denies past history Has alcohol/substance abuse ever caused legal problems?: No  Social Support System:   Conservation officer, nature Support System: Good Describe Community Support System: Husband Type of faith/religion: Baptist How does patient's faith help to cope with current illness?: NA  Leisure/Recreation:   Leisure and Hobbies: Caring for horses, riding and being outside  Strengths/Needs:   What things does the patient do well?: Caring for horses, riding and being outside In what areas does patient struggle / problems for patient: Housekeeping, chronic pain  Discharge Plan:   Does patient have access to transportation?: Yes Will patient be returning to same living situation after discharge?: Yes Currently receiving community  mental health services: No If no, would patient like referral  for services when discharged?: Yes (What county?) Medical sales representative) Does patient have financial barriers related to discharge medications?: No  Summary/Recommendations:   Summary and Recommendations (to be completed by the evaluator): Patient is 56 YO married disabled caucasian female admitted with diagnosis of Mood Disorder NOS and alcohol Dependence.Patient would benefit from crisis stabilization, medication evaluation, therapy groups for processing thoughts/feelings/experiences, psycho ed groups for coping skills, and case management for discharge planning    Clide Dales. 05/23/2012
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
## 2012-05-23 NOTE — Progress Notes (Signed)
[facility] Group Notes:  (Counselor/Nursing/MHT/Case Management/Adjunct)  05/23/2012 11:34 AM  Type of Therapy:  Group Therapy  Participation Level:  Active  Participation Quality:  Appropriate  Affect:  Appropriate  Cognitive:  Appropriate  Insight:  Good  Engagement in Group:  Good  Engagement in Therapy:  Good  Modes of Intervention:  Socialization  Summary of Progress/Problems:The purpose of this group was to actively engage patient in a therapeutic activity entitled "Human Bingo". The purpose of the group was to increase positive socialization skills and effective communication amongst the patient and their peers. Patient was very engaged in the activity and was pleasant and cooperative.        Ardelle Park O 05/23/2012, 11:34 AM

## 2012-05-23 NOTE — Tx Team (Signed)
Interdisciplinary Treatment Plan Update (Adult)  Date:  05/23/2012  Time Reviewed:  9:49 AM   Progress in Treatment: Attending groups: Yes Participating in groups:  Yes Taking medication as prescribed: Yes Tolerating medication:  Yes Family/Significant othe contact made:  Counselor assessing for appropriate contact Patient understands diagnosis:  Yes Discussing patient identified problems/goals with staff:  Yes Medical problems stabilized or resolved:  Yes Denies suicidal/homicidal ideation: Yes Issues/concerns per patient self-inventory:  None identified Other: N/A  New problem(s) identified: None Identified  Reason for Continuation of Hospitalization: Anxiety Depression Medication stabilization Withdrawal symptoms  Interventions implemented related to continuation of hospitalization: mood stabilization, medication monitoring and adjustment, group therapy and psycho education, safety checks q 15 mins  Additional comments: N/A  Estimated length of stay: 3-5 days  Discharge Plan: SW is assessing for appropriate referrals.    New goal(s): N/A  Review of initial/current patient goals per problem list:    1.  Goal(s): Address substance use  Met:  No  Target date: by discharge  As evidenced by: completing detox protocol and refer to appropriate treatment  2.  Goal (s): Reduce depressive and anxiety symptoms  Met:  No  Target date: by discharge  As evidenced by: Reducing depression from a 10 to a 3 as reported by pt.    3.  Goal(s): Eliminate SI  Met:  No  Target date: by discharge  As evidenced by: pt denying SI   Attendees: Patient:     Family:     Physician: Patrick North, MD 05/23/2012 9:49 AM   Nursing: Alease Frame, RN 05/23/2012 9:49 AM   Case Manager:  Reyes Ivan, LCSWA 05/23/2012  9:49 AM   Counselor:  Ronda Fairly, LCSWA 05/23/2012  9:49 AM   Other:  Lamount Cranker, RN 05/23/2012 9:50 AM   Other:  Jorje Guild, PA 05/23/2012 9:50 AM     Other:     Other:      Scribe for Treatment Team:   Reyes Ivan 05/23/2012 9:49 AM

## 2012-05-23 NOTE — Progress Notes (Signed)
[facility] Group Notes:  (Counselor/Nursing/MHT/Case Management/Adjunct)  05/23/2012 3:34 PM  Type of Therapy:  Group Therapy at 1:15 to 2:30  Participation Level:  Active  Participation Quality:  Attentive and Sharing  Affect:  Depressed, tearful  Cognitive:  Alert and Oriented  Insight:  Good  Engagement in Group:  Good  Engagement in Therapy:  Limited  Modes of Intervention:  Clarification, Education, Socialization and Support  Summary of Progress/Problems: Group session included an educational portion on Post Acute Withdrawal Syndrome (PAWS) and a processing portion on feelings about relapse and what, if anything, is the motive for recovery.  Suzanne [name] participated in activity in which patients choose photographs to represent what their life would look and feel like were it in recovery and in relapse. Suzanne [name] choose a close up photo of a tearful woman and shared that was her moment of clarity on how addiction is affecting her life.  She also chose a photo of someone dancing in the rain to signify the joy and energy she wishes to regain through recovery.    Clide Dales  05/23/2012 3:40 PM

## 2012-05-23 NOTE — Progress Notes (Signed)
Psychoeducational Group Note  Date:  05/23/2012 Time:  2005  Group Topic/Focus:  AA Group  Participation Level:  Active  Participation Quality:  Appropriate  Affect:  Appropriate  Cognitive:  Appropriate  Insight:  Good  Engagement in Group:  Good  Additional Comments:    Caliope Ruppert K 05/23/2012, 10:48 PM

## 2012-05-24 DIAGNOSIS — F1020 Alcohol dependence, uncomplicated: Secondary | ICD-10-CM

## 2012-05-24 DIAGNOSIS — F1010 Alcohol abuse, uncomplicated: Secondary | ICD-10-CM | POA: Diagnosis present

## 2012-05-24 DIAGNOSIS — F411 Generalized anxiety disorder: Secondary | ICD-10-CM

## 2012-05-24 DIAGNOSIS — F1320 Sedative, hypnotic or anxiolytic dependence, uncomplicated: Principal | ICD-10-CM

## 2012-05-24 NOTE — Progress Notes (Signed)
Psychoeducational Group Note  Date:  05/24/2012 Time:1000am  Group Topic/Focus:  Identifying Needs:   The focus of this group is to help patients identify their personal needs that have been historically problematic and identify healthy behaviors to address their needs.  Participation Level:  Did Not Attend  Participation Quality:    Affect:   Additional Comments:   Valente David 05/24/2012,11:18 AM

## 2012-05-24 NOTE — Progress Notes (Signed)
Patient ID: Suzanne [name], female   DOB: 01/13/1956, 56 y.o.   MRN: [id number]  Pt. attended and participated in aftercare planning group. Pt. verbally accepted information on suicide prevention, warning signs to look for with suicide and crisis line numbers to use. Pt. listed their current anxiety level as 0 and their current depression level as 0. Pt shared that she is homesick.

## 2012-05-24 NOTE — Progress Notes (Signed)
Patient ID: Suzanne [name], female   DOB: 01/20/1956, 56 y.o.   MRN: [id number] She has been up and to groups interacting with peers and staff. She has requested and received prn's for withdrawal symptoms and anxiety.  At this time she is in bed asleep.said that she had not slept well last night.  She denies thoughts of SI, depression and hopeless at 0 per self inventory.

## 2012-05-24 NOTE — Progress Notes (Signed)
Patient ID: OCTIVIA BAZER, female   DOB: 1956-06-23, 56 y.o.   MRN: [id number]   [facility] Group Notes:  (Counselor/Nursing/MHT/Case Management/Adjunct)  05/24/2012 1:15 PM  Type of Therapy:  Group Therapy, Dance/Movement Therapy   Participation Level:  Active  Participation Quality:  Appropriate and Sharing  Affect:  Appropriate  Cognitive:  Appropriate  Insight:  Good  Engagement in Group:  Good  Engagement in Therapy:  Good  Modes of Intervention:  Clarification, Problem-solving, Role-play, Socialization and Support  Summary of Progress/Problems: Therapist and group members discussed sacrifices that they are willing to make for their recovery. Group members shared challenges and changes that need to be made in their lives. Group members discussed the 12 promises and which ones were important to them. Pt was challenged on her current beliefs and was able to gain insight on giving up control and changing some of her people, places, and things. Pt began to think about the challenges and obstacles she faces and how she will handle them.     Cassidi Long 05/24/2012. 2:52 PM

## 2012-05-24 NOTE — Progress Notes (Signed)
D.  Pt pleasant on approach, positive for evening wrap up group.  Pt requested information on Remeron, received a dose last night and felt it made her feel out of it in the AM.  Denies SI/HI/hallucinations at this time.  Interacting appropriately within milieu.  A.  Printed requested information for Pt on medication.  Support and encouragement offered.  PRNs given as ordered for sleep and anxiety.  R.  No distress noted, no complaints voiced.  Will continue to monitor.

## 2012-05-24 NOTE — Progress Notes (Signed)
[name] MD Progress Note  05/24/2012 2:12 PM 2  Diagnosis:   Axis l: Alcohol dependency, benzodiazepine dependency, generalized anxiety disorder ADL's:  Intact  Sleep: Fair  Appetite:  Fair  Suicidal Ideation:  Patient denies suicidal ideation intent or means plans or access. Homicidal Ideation:  Patient denies homicidal ideation intent or means plans or access.  AEB (as evidenced by): Subjective: Met with the patient he reported the following. Suzanne [name] is up and active in the unit milieu she reports that she is 100% attending groups and reports no side effects to her medication. Mental Status Examination/Evaluation: Objective:  Appearance: Fairly Groomed  Patent attorney::  Good  Speech:  Clear and Coherent  Volume:  Normal  Mood:  Anxious  Affect:  Appropriate  Thought Process:  Coherent  Orientation:  Full  Thought Content:  WDL  Suicidal Thoughts:  No  Homicidal Thoughts:  No  Memory:  Immediate;   Fair Recent;   Fair Remote;   Fair  Judgement:  Intact  Insight:  Present  Psychomotor Activity:  Normal  Concentration:  Fair  Recall:  Fair  Akathisia:  No  Handed:  Right  AIMS (if indicated):     Assets:  Communication Skills Desire for Improvement Resilience Social Support Talents/Skills Transportation  Sleep:  Number of Hours: 4.5    Vital Signs:Blood pressure 131/86, pulse 73, temperature 98 F (36.7 C), temperature source Oral, resp. rate 17, height 5\' 5"  (1.651 m), weight 84.369 kg (186 lb). Current Medications: Current Facility-Administered Medications  Medication Dose Route Frequency Provider Last Rate Last Dose  . acetaminophen (TYLENOL) tablet 650 mg  650 mg Oral Q6H PRN Mike Craze, MD      . alum & mag hydroxide-simeth (MAALOX/MYLANTA) 200-200-20 MG/5ML suspension 30 mL  30 mL Oral Q4H PRN Mike Craze, MD      . carbamazepine (TEGRETOL) tablet 200 mg  200 mg Oral QID Himabindu Ravi, MD   200 mg at 05/24/12 1124   Followed by  . carbamazepine  (TEGRETOL) tablet 200 mg  200 mg Oral TID Himabindu Ravi, MD       Followed by  . carbamazepine (TEGRETOL) tablet 200 mg  200 mg Oral BID Himabindu Ravi, MD      . carvedilol (COREG) tablet 6.25 mg  6.25 mg Oral BID WC Himabindu Ravi, MD   6.25 mg at 05/24/12 0827  . chlordiazePOXIDE (LIBRIUM) capsule 25 mg  25 mg Oral Q6H PRN Larena Sox, MD   25 mg at 05/24/12 0830  . hydrOXYzine (ATARAX/VISTARIL) tablet 25 mg  25 mg Oral Q6H PRN Larena Sox, MD   25 mg at 05/24/12 1123  . ibuprofen (ADVIL,MOTRIN) tablet 600 mg  600 mg Oral Q6H PRN Jorje Guild, PA-C   600 mg at 05/24/12 1125  . irbesartan (AVAPRO) tablet 75 mg  75 mg Oral Daily Himabindu Ravi, MD   75 mg at 05/24/12 0827  . letrozole [facility]) tablet 2.5 mg  2.5 mg Oral Daily Himabindu Ravi, MD   2.5 mg at 05/24/12 0827  . loperamide (IMODIUM) capsule 2-4 mg  2-4 mg Oral PRN Larena Sox, MD      . magnesium hydroxide (MILK OF MAGNESIA) suspension 30 mL  30 mL Oral Daily PRN Mike Craze, MD      . mirtazapine (REMERON) tablet 7.5 mg  7.5 mg Oral QHS PRN,MR X 1 Jorje Guild, PA-C   7.5 mg at 05/23/12 2328  . multivitamin with minerals tablet 1 tablet  1 tablet Oral Daily Larena Sox, MD   1 tablet at 05/24/12 0827  . ondansetron (ZOFRAN-ODT) disintegrating tablet 4 mg  4 mg Oral Q6H PRN Larena Sox, MD      . thiamine (VITAMIN B-1) tablet 100 mg  100 mg Oral Daily Larena Sox, MD   100 mg at 05/24/12 0827    Lab Results: No results found for this or any previous visit (from the past 48 hour(s)). Objective: Tayia denies any sleep on side effects of her medication reports a few mild sweats mild abdominal discomfort no tremors no nausea vomiting and diarrhea on remainder review of systems is negative she plans to return home upon completion of her detox she reports no new complaints. Physical Findings: AIMS: Facial and Oral Movements Muscles of Facial Expression: None, normal Lips and Perioral Area: None, normal Jaw:  None, normal Tongue: None, normal,Extremity Movements Upper (arms, wrists, hands, fingers): None, normal Lower (legs, knees, ankles, toes): None, normal, Trunk Movements Neck, shoulders, hips: None, normal, Overall Severity Severity of abnormal movements (highest score from questions above): None, normal Incapacitation due to abnormal movements: None, normal Patient's awareness of abnormal movements (rate only patient's report): No Awareness, Dental Status Current problems with teeth and/or dentures?: No Does patient usually wear dentures?: No  CIWA:  CIWA-Ar Total: 5  COWS:     Treatment Plan Summary: Daily contact with patient to assess and evaluate symptoms and progress in treatment Medication management  Plan: 1. Continue the Tegretol detox as written. 2. Continue Coreg 6.252 times daily with meals. 3. Continue Avapro 75 mg by mouth daily. 4. Continued letrozole 2.5 by mouth daily.  5 Will order vitamin D level. 6. Labs are reviewed and are unremarkable.  7. We'll continue to follow.  [name]. [name] PAC 05/24/2012, 2:12 PM

## 2012-05-24 NOTE — Progress Notes (Signed)
D.  Took over Pt care at 2330.  Gave Pt prn librium and Remeron at her request due to anxiety and insomnia.  Pt states that she wakes up every time a MHT comes in to do 15 minute checks.  No acute distress noted.  A.  Will continue to monitor.  R.  Pt remains safe, laying in bed awake at this time.

## 2012-05-25 MED ORDER — HYDROXYZINE HCL 50 MG PO TABS: 50.0000 mg | ORAL_TABLET | Freq: Every evening | ORAL | Status: DC | PRN

## 2012-05-25 NOTE — Progress Notes (Signed)
Psychoeducational Group Note  Date:  05/25/2012 Time:  1000am  Group Topic/Focus:  Making Healthy Choices:   The focus of this group is to help patients identify negative/unhealthy choices they were using prior to admission and identify positive/healthier coping strategies to replace them upon discharge.  Participation Level:  Active  Participation Quality:  Appropriate  Affect:  Appropriate  Cognitive:  Appropriate  Insight:  Good  Engagement in Group:  Good  Additional Comments:    Valente David 05/25/2012,11:21 AM

## 2012-05-25 NOTE — Progress Notes (Signed)
D. Pt pleasant on approach.  Not happy that her librium was discontinued, states that it has worked best for her anxiety and night sweats, as well as insomnia.  Denies SI/HI/hallucinations at this time.  Interacting appropriately within milieu.  Positive for evening AA group, see group notes.  A.  Explained how protocol tapers then discontinues.  Told Pt that I could call doctor on call to get Vistaril ordered for sleep, as she feels the Remeron is too sedating in the AM.  Support and encouragement offered.  R.  Pt verbalized understanding of taper but stated that she believes librium is what her physician ordered for her at home for sleep.  Pt pleased to have Vistaril available instead of Remeron.  Hopeful to be able to sleep well again tonight as she states she did last night.  Will continue to monitor.

## 2012-05-25 NOTE — Progress Notes (Signed)
Patient ID: Suzanne [name], female   DOB: September 27, 1955, 56 y.o.   MRN: [id number]  Pt. attended and participated in aftercare planning group. Pt. verbally accepted information on suicide prevention, warning signs to look for with suicide and crisis line numbers to use. Pt. listed their current anxiety level as 0 and their current depression level as 0. Pt shared that she had the best night she has had in a long time without sleep medication.

## 2012-05-25 NOTE — Progress Notes (Signed)
[facility] MD Progress Note  05/25/2012 5:42 PM 3 Diagnosis:   Axis l: Alcohol dependency, benzodiazepine dependency, generalized anxiety disorder ADL's:  Intact  Sleep: Fair  Appetite:  Fair  Suicidal Ideation:  Patient denies suicidal ideation intent or means plans or access. Homicidal Ideation:  Patient denies homicidal ideation intent or means plans or access.  AEB (as evidenced by): Subjective: Met with the patient he reported the following. Patient states she no longer wants to take the [facility] as it is making her cognition slowed and causing a headache.  Mental Status Examination/Evaluation: Objective:  Appearance: Fairly Groomed  Patent attorney::  Good  Speech:  Clear and Coherent  Volume:  Normal  Mood:  Anxious  Affect:  Appropriate  Thought Process:  Coherent  Orientation:  Full  Thought Content:  WDL  Suicidal Thoughts:  No  Homicidal Thoughts:  No  Memory:  Immediate;   Fair Recent;   Fair Remote;   Fair  Judgement:  Intact  Insight:  Present  Psychomotor Activity:  Normal  Concentration:  Fair  Recall:  Fair  Akathisia:  No  Handed:  Right  AIMS (if indicated):     Assets:  Communication Skills Desire for Improvement Resilience Social Support Talents/Skills Transportation  Sleep:  Number of Hours: 6.5    Vital Signs:Blood pressure 127/89, pulse 70, temperature 98.3 F (36.8 C), temperature source Oral, resp. rate 16, height 5\' 5"  (1.651 m), weight 84.369 kg (186 lb). Current Medications: Current Facility-Administered Medications  Medication Dose Route Frequency Provider Last Rate Last Dose  . acetaminophen (TYLENOL) tablet 650 mg  650 mg Oral Q6H PRN Mike Craze, MD      . alum & mag hydroxide-simeth (MAALOX/MYLANTA) 200-200-20 MG/5ML suspension 30 mL  30 mL Oral Q4H PRN Mike Craze, MD      . carbamazepine (TEGRETOL) tablet 200 mg  200 mg Oral QID Himabindu Ravi, MD   200 mg at 05/24/12 1652  . carvedilol (COREG) tablet 6.25 mg  6.25 mg Oral BID  WC Himabindu Ravi, MD   6.25 mg at 05/25/12 1707  . chlordiazePOXIDE (LIBRIUM) capsule 25 mg  25 mg Oral Q6H PRN Larena Sox, MD   25 mg at 05/25/12 1255  . hydrOXYzine (ATARAX/VISTARIL) tablet 25 mg  25 mg Oral Q6H PRN Larena Sox, MD   25 mg at 05/24/12 1123  . ibuprofen (ADVIL,MOTRIN) tablet 600 mg  600 mg Oral Q6H PRN Jorje Guild, PA-C   600 mg at 05/25/12 4098  . irbesartan (AVAPRO) tablet 75 mg  75 mg Oral Daily Himabindu Ravi, MD   75 mg at 05/25/12 0758  . letrozole [facility]) tablet 2.5 mg  2.5 mg Oral Daily Himabindu Ravi, MD   2.5 mg at 05/25/12 0758  . loperamide (IMODIUM) capsule 2-4 mg  2-4 mg Oral PRN Larena Sox, MD      . magnesium hydroxide (MILK OF MAGNESIA) suspension 30 mL  30 mL Oral Daily PRN Mike Craze, MD      . mirtazapine (REMERON) tablet 7.5 mg  7.5 mg Oral QHS PRN,MR X 1 Jorje Guild, PA-C   7.5 mg at 05/23/12 2328  . multivitamin with minerals tablet 1 tablet  1 tablet Oral Daily Larena Sox, MD   1 tablet at 05/25/12 0758  . ondansetron (ZOFRAN-ODT) disintegrating tablet 4 mg  4 mg Oral Q6H PRN Larena Sox, MD      . thiamine (VITAMIN B-1) tablet 100 mg  100 mg Oral  Daily Larena Sox, MD   100 mg at 05/25/12 0758  . DISCONTD: carbamazepine (TEGRETOL) tablet 200 mg  200 mg Oral TID Himabindu Ravi, MD   200 mg at 05/25/12 0801  . DISCONTD: carbamazepine (TEGRETOL) tablet 200 mg  200 mg Oral BID Himabindu Ravi, MD        Lab Results: No results found for this or any previous visit (from the past 48 hour(s)). Objective: Chauntay denies any sleep on side effects of her medication reports a few mild sweats mild abdominal discomfort no tremors no nausea vomiting and diarrhea on remainder review of systems is negative she plans to return home upon completion of her detox she reports no new complaints. Physical Findings: AIMS: Facial and Oral Movements Muscles of Facial Expression: None, normal Lips and Perioral Area: None, normal Jaw: None,  normal Tongue: None, normal,Extremity Movements Upper (arms, wrists, hands, fingers): None, normal Lower (legs, knees, ankles, toes): None, normal, Trunk Movements Neck, shoulders, hips: None, normal, Overall Severity Severity of abnormal movements (highest score from questions above): None, normal Incapacitation due to abnormal movements: None, normal Patient's awareness of abnormal movements (rate only patient's report): No Awareness, Dental Status Current problems with teeth and/or dentures?: No Does patient usually wear dentures?: No  CIWA:  CIWA-Ar Total: 2  COWS:     Treatment Plan Summary: Daily contact with patient to assess and evaluate symptoms and progress in treatment Medication management  Plan: 1. Discontinued  the Tegretol detox as written. 2. Continue Coreg 6.252 times daily with meals. 3. Continue Avapro 75 mg by mouth daily. 4. Continued letrozole 2.5 by mouth daily.  5 Will order vitamin D level. 6. Labs are reviewed and are unremarkable.  7. We'll continue to follow.  [name]. Keyonia Gluth PAC 05/25/2012, 5:42 PM

## 2012-05-25 NOTE — Progress Notes (Signed)
Patient ID: Suzanne [name], female   DOB: [birth year], 56 y.o.   MRN: [id number]   [facility] Group Notes:  (Counselor/Nursing/MHT/Case Management/Adjunct)  05/25/2012 1:15 PM  Type of Therapy:  Group Therapy, Dance/Movement Therapy   Participation Level:  Active  Participation Quality:  Appropriate  Affect:  Appropriate  Cognitive:  Appropriate  Insight:  Good  Engagement in Group:  Good  Engagement in Therapy:  Good  Modes of Intervention:  Clarification, Problem-solving, Role-play, Socialization and Support  Summary of Progress/Problems: Therapist and group members created a list of what addiction has cost them and what they would like to say to their addiction if they could. Group members shared their personal strengths and read the poem, " I am your recovery" and discussed what they liked. Pt shared that alcohol has cost her self-dignity and that it has embarrassed and humiliated her. Pt shared that her strength is that she does not want to come back to a place like this.      Cassidi Long 05/25/2012. 2:56 PM

## 2012-05-25 NOTE — Progress Notes (Signed)
[facility] Group Notes:  (Counselor/Nursing/MHT/Case Management/Adjunct)  05/25/2012 10:51 AM  Type of Therapy:  Psychoeducational Skills  Participation Level:  Minimal  Participation Quality:  Appropriate, Resistant, Sharing and Supportive  Affect:  Appropriate and Flat  Cognitive:  Alert and Appropriate  Insight:  Good  Engagement in Group:  Limited  Engagement in Therapy:  Limited  Modes of Intervention:  Activity, Education and Support  Summary of Progress/Problems: Pt attended and participated in group where pts discussed "Ending the Cycle" from Sunday workbook.   Dalia Heading 05/25/2012, 10:51 AM

## 2012-05-25 NOTE — Progress Notes (Signed)
Patient ID: JAIDENCE Suzanne [name], female   DOB: Jan 09, 1956, 56 y.o.   MRN: [id number] 05-25-12 @ 1410 nursing shift note: D: she stated her w/d symptoms have improved other than anxiety. She stated she is ready for discharge. A: anxiety was addressed with librium prn. R: the librium has decreased her anxiety. rn will continue to monitor and q 15 min cks continue.

## 2012-05-25 NOTE — Progress Notes (Signed)
[facility] Group Notes:  (Counselor/Nursing/MHT/Case Management/Adjunct)  05/24/2012 2100 Type of Therapy:  wrap up group  Participation Level:  Active  Participation Quality:  Appropriate, Attentive and Sharing  Affect:  Appropriate  Cognitive:  Alert and Appropriate  Insight:  Good  Engagement in Group:  Good  Engagement in Therapy:  Good  Modes of Intervention:  Clarification, Education and Support  Summary of Progress/Problems: Patient stated being grateful for having a very supportive husband.  Pt reported learning positive coping mechanisms for avoiding alcohol and temptation especially during gatherings at restaurants.  Pt is looking forward to learning more ways to avoid.    Shelah Lewandowsky 05/25/2012, 2:13 AM

## 2012-05-26 DIAGNOSIS — F1010 Alcohol abuse, uncomplicated: Secondary | ICD-10-CM

## 2012-05-26 MED ORDER — CARVEDILOL 6.25 MG PO TABS: 6.2500 mg | ORAL_TABLET | Freq: Two times a day (BID) | ORAL | Status: DC

## 2012-05-26 MED ORDER — MIRTAZAPINE 7.5 MG PO TABS: 7.5000 mg | ORAL_TABLET | Freq: Every evening | ORAL | Status: DC | PRN

## 2012-05-26 NOTE — [facility] Suicide Risk Assessment (Signed)
Suicide Risk Assessment  Discharge Assessment     Demographic Factors:  Female, caucasian, married.  Mental Status Per Nursing Assessment::   On Admission:  NA  Current Mental Status by Physician: Patient alert and oriented to 4. Speech normal in rate and volume. Thinking is logical. Fair insight and judgement. Patient denies AH/VH/SI/HI.  Loss Factors: Decrease in vocational status  Historical Factors: Impulsivity  Risk Reduction Factors:   Living with another person, especially a relative, Positive social support and Positive coping skills or problem solving skills  Continued Clinical Symptoms:  Alcohol/Substance Abuse/Dependencies  Cognitive Features That Contribute To Risk:  Cognitively intact  Suicide Risk:  Minimal: No identifiable suicidal ideation.  Patients presenting with no risk factors but with morbid ruminations; may be classified as minimal risk based on the severity of the depressive symptoms  Discharge Diagnoses:   AXIS I:  Alcohol Abuse AXIS II:  No diagnosis AXIS III:   Past Medical History  Diagnosis Date  . Dyspnea     myoview 2011: EF 55% question of  mild reverible anterior defect. thought to be breast attenuation. echo 45-50% with global HK. Grade 1 diastolyic dysfunction. RV nml. cardiac MRI with EF 44% with septal HK. no scar   . Cancer     breast ca - 1994; recurred in 2007. s/p masectomy with flap rconstruction aand chemo '94. local recurrence on chest wall. chemo and XRT '07. now femara  . Hypertension   . Lung disorder   . History of coronary artery stent placement    AXIS IV:  occupational problems and other psychosocial or environmental problems AXIS V:  61-70 mild symptoms  Plan Of Care/Follow-up recommendations:  Activity:  normal Diet:  normal Follow up with [facility].  Is patient on multiple antipsychotic therapies at discharge:  No   Has Patient had three or more failed trials of antipsychotic monotherapy by history:   No  Recommended Plan for Multiple Antipsychotic Therapies: NA  Suzanne [name] 05/26/2012, 9:57 AM

## 2012-05-26 NOTE — Progress Notes (Signed)
[facility] Case Management Discharge Plan:  Will you be returning to the same living situation after discharge: Yes,  returning home At discharge, do you have transportation home?:Yes,  access to transportation Do you have the ability to pay for your medications:Yes,  access to meds   Release of information consent forms completed and in the chart;  Patient's signature needed at discharge.  Patient to Follow up at:  Follow-up Information    Follow up with [facility]. On 05/28/2012. (Appointment scheduled at 11:00 am)    Contact information:   [address] [place], [place] [address] [phone]         Patient denies SI/HI:   Yes,  denies SI/HI today    Safety Planning and Suicide Prevention discussed:  Yes,  discussed with pt today  Barrier to discharge identified:No.  Summary and Recommendations: Pt attended discharge planning group and actively participated in group.  SW provided pt with today's workbook.  Pt presents with calm mood and affect.  Pt rates depression and anxiety at a 0 today.  Pt denies SI/HI.  Pt reports feeling stable to d/c today.  No recommendations from SW.  No further needs voiced by pt.  Pt stable to discharge.     Carmina Miller 05/26/2012, 10:55 AM

## 2012-05-26 NOTE — Progress Notes (Signed)
Patient did attend the evening speaker AA meeting.  

## 2012-05-26 NOTE — Tx Team (Signed)
Interdisciplinary Treatment Plan Update (Adult)  Date:  05/26/2012  Time Reviewed:  9:39 AM   Progress in Treatment: Attending groups: Yes Participating in groups:  Yes Taking medication as prescribed: Yes Tolerating medication:  Yes Family/Significant othe contact made:  Yes Patient understands diagnosis:  Yes Discussing patient identified problems/goals with staff:  Yes Medical problems stabilized or resolved:  Yes Denies suicidal/homicidal ideation: Yes Issues/concerns per patient self-inventory:  None identified Other: N/A  New problem(s) identified: None Identified  Reason for Continuation of Hospitalization: Stable to d/c  Interventions implemented related to continuation of hospitalization: Stable to d/c  Additional comments: N/A  Estimated length of stay: D/C today  Discharge Plan: Pt will follow up with [facility] for further treatment.  New goal(s): N/A  Review of initial/current patient goals per problem list:    1. Goal(s): Address substance use  Met: Yes Target date: by discharge  As evidenced by: completed detox protocol and referred to appropriate treatment 2. Goal (s): Reduce depressive and anxiety symptoms  Met: No  Target date: by discharge  As evidenced by: Reducing depression from a 10 to a 3 as reported by pt. Pt rates depression at a 0 and anxiety at a 4 today.  3. Goal(s): Eliminate SI  Met: Yes  Target date: by discharge  As evidenced by: pt denying SI  Attendees: Patient:  Suzanne [name]  05/26/2012 9:39 AM   Family:     Physician:  Patrick North, MD 05/26/2012 9:39 AM   Nursing: Alease Frame, RN 05/26/2012 9:39 AM   Case Manager:  Reyes Ivan, LCSWA 05/26/2012 9:39 AM   Counselor:  Ronda Fairly, LCSWA 05/26/2012 9:39 AM   Other:  Charlyne Mom, RN 05/26/2012 9:40 AM   Other:  Roswell Miners, RN 05/26/2012 9:39 AM   Other:  Jules Schick, RN 05/26/2012 9:40 AM   Other:      Scribe for Treatment Team:   Reyes Ivan 05/26/2012  9:39 AM

## 2012-05-26 NOTE — Progress Notes (Signed)
Patient ID: Suzanne [name], female   DOB: 08-15-1955, 56 y.o.   MRN: [id number] Pt discharged at this time, pt provided with prescriptions, pt provided with discharge instructions and pt verbalized understanding, all belongings were returned, pt provided with shuttle ride back to [facility] where car is, pt denies any suicidality this morning

## 2012-05-26 NOTE — Discharge Summary (Signed)
Physician Discharge Summary Note  Patient:  Suzanne [name] is an 56 y.o., female MRN:  [id number] DOB:  1956/07/09 Patient phone:  [phone] (home)  Patient address:   [address] [place] [address]   Date of Admission:  05/22/2012 Date of Discharge: 05/26/2012  Discharge Diagnoses: Principal Problem:  *Alcohol abuse Active Problems:  Benzodiazepine dependence  Axis Diagnosis: AXIS I: Alcohol Abuse  AXIS II: No diagnosis  AXIS III:  Past Medical History   Diagnosis  Date   .  Dyspnea      myoview 2011: EF 55% question of mild reverible anterior defect. thought to be breast attenuation. echo 45-50% with global HK. Grade 1 diastolyic dysfunction. RV nml. cardiac MRI with EF 44% with septal HK. no scar   .  Cancer      breast ca - 1994; recurred in 2007. s/p masectomy with flap rconstruction aand chemo '94. local recurrence on chest wall. chemo and XRT '07. now femara   .  Hypertension    .  Lung disorder    .  History of coronary artery stent placement     AXIS IV: occupational problems and other psychosocial or environmental problems  AXIS V: 61-70 mild symptoms   Level of Care:  Inpatient Hospitalization.  Reason for admission: Patient is a 56 yo caucasian woman, she reports that she has been drinking a large bottle of wine daily for at least the last 6 months, and has been on Ativan since 1992, and has been increasing that dose to help her sleep. She reports that about 2 days ago she had a "meltdown" after becoming upset with herself due to her drinking behavior. She is interested in detoxing from both alcohol and Ativan. She denies any depression or anxiety. She denies any suicidal or homicidal ideation. She denies any auditory or visual hallucinations.   Hospital Course:   The patient attended treatment team meeting this am and met with treatment team members. The patient's symptoms, treatment plan and response to treatment was discussed. The patient endorsed  that their symptoms have improved. The patient also stated that they felt stable for discharge.  They reported that from this hospital stay they had learned many coping skills.  In other to maintain their psychiatric stability, they will continue psychiatric care on an outpatient basis. They will follow-up as outlined below.  In addition they were instructed  to take all your medications as prescribed by their mental healthcare provider and to report any adverse effects and or reactions from your medicines to their outpatient provider promptly.  The patient is also instructed and cautioned to not engage in alcohol and or illegal drug use while on prescription medicines.  In the event of worsening symptoms the patient is instructed to call the crisis hotline, 911 and or go to the nearest ED for appropriate evaluation and treatment of symptoms.   Also while a patient in this hospital, the patient received medication management for his psychiatric symptoms. They were ordered and received as outlined below:    Medication List     As of 05/26/2012  1:07 PM    STOP taking these medications         Calcium Carbonate-Vitamin D 600-400 MG-UNIT per tablet      cetirizine 10 MG tablet   Commonly known as: ZYRTEC      cholecalciferol 1000 UNITS tablet   Commonly known as: VITAMIN D      ibuprofen 200 MG tablet   Commonly  known as: ADVIL,MOTRIN      LORazepam 2 MG tablet   Commonly known as: ATIVAN      multivitamin tablet      TAKE these medications      Indication    carvedilol 6.25 MG tablet   Commonly known as: COREG   Take 1 tablet (6.25 mg total) by mouth 2 (two) times daily with a meal. For heart condition       letrozole 2.5 MG tablet   Commonly known as: FEMARA   Take 2.5 mg by mouth daily.       mirtazapine 7.5 MG tablet   Commonly known as: REMERON   Take 1 tablet (7.5 mg total) by mouth at bedtime as needed and may repeat dose one time if needed (insomnia). For mood issues         valsartan 40 MG tablet   Commonly known as: DIOVAN   Take 1 tablet (40 mg total) by mouth 2 (two) times daily.        They were also enrolled in group counseling sessions and activities in which they participated actively.       Follow-up Information    Follow up with [facility]. On 05/28/2012. (Appointment scheduled at 11:00 am)    Contact information:   [address] [place], [place] [address] [phone]         Upon discharge, patient adamantly denies suicidal, homicidal ideations, auditory, visual hallucinations and or delusional thinking. They left [facility] with all personal belongings via personal transportation in no apparent distress.  Consults:  Please see electronic medical record for details.  Significant Diagnostic Studies:  Please see electronic medical record for details.  Discharge Vitals:   Blood pressure 123/86, pulse 93, temperature 97.7 F (36.5 C), temperature source Oral, resp. rate 16, height 5\' 5"  (1.651 m), weight 84.369 kg (186 lb)..  Mental Status Exam: See Mental Status Examination and Suicide Risk Assessment completed by Attending Physician prior to discharge.  Discharge destination:  Home  Is patient on multiple antipsychotic therapies at discharge:  No  Has Patient had three or more failed trials of antipsychotic monotherapy by history: N/A Recommended Plan for Multiple Antipsychotic Therapies: N/A Discharge Orders    Future Appointments: Provider: Department: Dept Phone: Center:   05/27/2012 2:00 PM Iran Ouch, MD [facility] [phone] [facility]   06/18/2012 1:00 PM Radene Gunning [facility] [phone] None   06/18/2012 1:30 PM Pierce Crane, MD [facility] [phone] None     Future Orders Please Complete By Expires   Diet - low sodium heart healthy      Increase activity slowly          Medication List     As of 05/26/2012  1:07 PM    STOP taking these medications          Calcium Carbonate-Vitamin D 600-400 MG-UNIT per tablet      cetirizine 10 MG tablet   Commonly known as: ZYRTEC      cholecalciferol 1000 UNITS tablet   Commonly known as: VITAMIN D      ibuprofen 200 MG tablet   Commonly known as: ADVIL,MOTRIN      LORazepam 2 MG tablet   Commonly known as: ATIVAN      multivitamin tablet      TAKE these medications      Indication    carvedilol 6.25 MG tablet   Commonly known as: COREG   Take 1 tablet (6.25  mg total) by mouth 2 (two) times daily with a meal. For heart condition       letrozole 2.5 MG tablet   Commonly known as: FEMARA   Take 2.5 mg by mouth daily.       mirtazapine 7.5 MG tablet   Commonly known as: REMERON   Take 1 tablet (7.5 mg total) by mouth at bedtime as needed and may repeat dose one time if needed (insomnia). For mood issues       valsartan 40 MG tablet   Commonly known as: DIOVAN   Take 1 tablet (40 mg total) by mouth 2 (two) times daily.            Follow-up Information    Follow up with [facility] - [facility]. On 05/28/2012. (Appointment scheduled at 11:00 am)    Contact information:   [address] [place], [place] [address] [phone]        Follow-up recommendations:   Activities: Resume typical activities Diet: Resume typical diet Other: Follow up with outpatient provider and report any side effects to out patient prescriber.  Comments:  Take all your medications as prescribed by your mental healthcare provider. Report any adverse effects and or reactions from your medicines to your outpatient provider promptly. Patient is instructed and cautioned to not engage in alcohol and or illegal drug use while on prescription medicines. In the event of worsening symptoms, patient is instructed to call the crisis hotline, 911 and or go to the nearest ED for appropriate evaluation and treatment of symptoms. Follow-up with your primary care provider for your other medical  issues, concerns and or health care needs.  SignedPatrick North 05/26/2012 1:07 PM

## 2012-05-27 ENCOUNTER — Ambulatory Visit (INDEPENDENT_AMBULATORY_CARE_PROVIDER_SITE_OTHER): Payer: Medicare Other | Admitting: Cardiovascular Disease

## 2012-05-27 ENCOUNTER — Encounter: Payer: Self-pay | Admitting: Cardiovascular Disease

## 2012-05-27 VITALS — BP 100/72 | HR 87 | Ht 65.0 in | Wt 186.8 lb

## 2012-05-27 DIAGNOSIS — I10 Essential (primary) hypertension: Secondary | ICD-10-CM

## 2012-05-27 DIAGNOSIS — R079 Chest pain, unspecified: Secondary | ICD-10-CM

## 2012-05-27 DIAGNOSIS — R0602 Shortness of breath: Secondary | ICD-10-CM

## 2012-05-27 NOTE — Progress Notes (Signed)
Patient Discharge Instructions:  After Visit Summary (AVS):   Access to EMR:  05/27/12 Discharge Summary Note:   Access to EMR:  05/27/12 Psychiatric Admission Assessment Note:   Access to EMR:  05/27/12 Suicide Risk Assessment - Discharge Assessment:   Access to EMR:  05/27/12 Next Level Care Provider Has Access to the EMR, 05/27/12  EMR to [facility].   Karleen Hampshire Brittini, 05/27/2012, 1:58 PM

## 2012-05-27 NOTE — Patient Instructions (Addendum)
Your physician has requested that you have an echocardiogram. Echocardiography is a painless test that uses sound waves to create images of your heart. It provides your doctor with information about the size and shape of your heart and how well your heart's chambers and valves are working. This procedure takes approximately one hour. There are no restrictions for this procedure.  Will decide on follow up based on results.

## 2012-05-28 ENCOUNTER — Other Ambulatory Visit (HOSPITAL_COMMUNITY): Payer: Medicare Other | Attending: Psychiatry | Admitting: Psychology

## 2012-05-28 DIAGNOSIS — R0609 Other forms of dyspnea: Secondary | ICD-10-CM | POA: Insufficient documentation

## 2012-05-28 DIAGNOSIS — R0989 Other specified symptoms and signs involving the circulatory and respiratory systems: Secondary | ICD-10-CM | POA: Insufficient documentation

## 2012-05-28 DIAGNOSIS — Z853 Personal history of malignant neoplasm of breast: Secondary | ICD-10-CM | POA: Insufficient documentation

## 2012-05-28 DIAGNOSIS — F1010 Alcohol abuse, uncomplicated: Secondary | ICD-10-CM | POA: Insufficient documentation

## 2012-05-28 DIAGNOSIS — F1320 Sedative, hypnotic or anxiolytic dependence, uncomplicated: Secondary | ICD-10-CM | POA: Insufficient documentation

## 2012-05-28 LAB — VITAMIN D 1,25 DIHYDROXY
Vitamin D 1, 25 (OH)2 Total: 36 pg/mL (ref 18–72)
Vitamin D2 1, 25 (OH)2: <8 pg/mL

## 2012-05-29 ENCOUNTER — Other Ambulatory Visit (HOSPITAL_COMMUNITY): Payer: Self-pay

## 2012-05-29 ENCOUNTER — Other Ambulatory Visit (HOSPITAL_COMMUNITY): Payer: Medicare Other
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
## 2012-05-30 ENCOUNTER — Other Ambulatory Visit (HOSPITAL_COMMUNITY): Payer: 59 | Attending: Psychiatry | Admitting: Psychology

## 2012-05-30 DIAGNOSIS — F1010 Alcohol abuse, uncomplicated: Secondary | ICD-10-CM | POA: Insufficient documentation

## 2012-05-30 DIAGNOSIS — F1320 Sedative, hypnotic or anxiolytic dependence, uncomplicated: Secondary | ICD-10-CM | POA: Insufficient documentation

## 2012-05-30 DIAGNOSIS — R0989 Other specified symptoms and signs involving the circulatory and respiratory systems: Secondary | ICD-10-CM | POA: Insufficient documentation

## 2012-05-30 DIAGNOSIS — R0609 Other forms of dyspnea: Secondary | ICD-10-CM | POA: Insufficient documentation

## 2012-05-30 DIAGNOSIS — F1920 Other psychoactive substance dependence, uncomplicated: Secondary | ICD-10-CM

## 2012-05-30 DIAGNOSIS — Z853 Personal history of malignant neoplasm of breast: Secondary | ICD-10-CM | POA: Insufficient documentation

## 2012-05-31 LAB — PRESCRIPTION ABUSE MONITORING 17P, URINE
Buprenorphine, Urine: NEGATIVE ng/mL
Cannabinoid Scrn, Ur: NEGATIVE ng/mL
Carisoprodol, Urine: NEGATIVE ng/mL
Fentanyl, Ur: NEGATIVE ng/mL
MDMA URINE: NEGATIVE ng/mL
Methadone Screen, Urine: NEGATIVE ng/mL
Opiate Screen, Urine: NEGATIVE ng/mL
Tramadol Scrn, Ur: NEGATIVE ng/mL

## 2012-06-02 ENCOUNTER — Other Ambulatory Visit (HOSPITAL_COMMUNITY): Payer: Medicare Other | Attending: Psychiatry | Admitting: Psychology

## 2012-06-02 DIAGNOSIS — Z853 Personal history of malignant neoplasm of breast: Secondary | ICD-10-CM | POA: Insufficient documentation

## 2012-06-02 DIAGNOSIS — F1010 Alcohol abuse, uncomplicated: Secondary | ICD-10-CM | POA: Diagnosis present

## 2012-06-02 DIAGNOSIS — R0989 Other specified symptoms and signs involving the circulatory and respiratory systems: Secondary | ICD-10-CM | POA: Diagnosis not present

## 2012-06-02 DIAGNOSIS — F1320 Sedative, hypnotic or anxiolytic dependence, uncomplicated: Secondary | ICD-10-CM | POA: Diagnosis not present

## 2012-06-02 DIAGNOSIS — R0609 Other forms of dyspnea: Secondary | ICD-10-CM | POA: Insufficient documentation

## 2012-06-02 NOTE — Progress Notes (Signed)
    Daily Group Progress Note  Program: CD-IOP   Group Time: 1-2:30 pm  Participation Level: Active  Behavioral Response: Appropriate and Sharing  Type of Therapy: Process Group  Topic: Group Process: first half of group was spent in process. Members shared about their current struggles and issues in early recovery. During check-in, no one had relapsed since the previous group session. There was good discussion among the group, especially after one member admitted he had been thinking about drinking tomorrow when his parents are out of town and he is alone at the house. There was good feedback among group members. Also present was a woman who is in the [facility] nursing program and a visitor this afternoon. She asked good questions and made some excellent observations during the session.  Group Time: 2:445- 4pm  Participation Level: Active  Behavioral Response: Appropriate and Sharing  Type of Therapy: Psycho-education Group  Topic: Psycho-Ed: second half of group included handouts and an educational piece on how group works and the responsibilities of each member. The group discussed how members avoid being fully themselves and examples of each were identified by members. The importance of the rules and expectations of the group and each member were carefully identified and discussed. The session proved extremely effective in educating members on the purpose of group therapy and how each of them should come to the session.  Summary:The patient checked-in with a sobriety date of 10/10. She informed the group she had had an incredible time last night. She explained that she and a good friend had gone to a concert - [employer] - and she had not been to a concert in 30 years sober. She enjoyed it and noted she could remember all of it and it felt very good. She admitted her friend had been drinking and she had even held her drink while the other went to the bathroom. She admitted the smell made  her nauseous and she found her friend's growing intoxication somewhat disgusting. She also reported she had had a great conversation with her daughter yesterday and sobriety just feels really good. She also shared about her husband and his passive nature. He clearly enabled her drinking and never really commented as she descended deeper into her alcohol use. The patient made some excellent comments and reported she really enjoys this group. She also met with the medical director as is protocol for new members on Friday.     Family Program: Family present? No   Name of family member(s):   UDS collected: Yes Results: results not back yet  AA/NA attended?: No, but patient is new and just getting started in recovery  Sponsor?: No   Areeba Sulser, LCAS

## 2012-06-03 ENCOUNTER — Other Ambulatory Visit: Payer: Self-pay

## 2012-06-03 ENCOUNTER — Encounter: Payer: Self-pay | Admitting: Cardiovascular Disease

## 2012-06-03 ENCOUNTER — Other Ambulatory Visit (HOSPITAL_COMMUNITY): Payer: Self-pay

## 2012-06-03 DIAGNOSIS — I10 Essential (primary) hypertension: Secondary | ICD-10-CM | POA: Insufficient documentation

## 2012-06-03 LAB — BENZODIAZEPINES (GC/LC/MS), URINE
Clonazepam metabolite (GC/LC/MS), ur confirm: NEGATIVE ng/mL
Diazepam (GC/LC/MS), ur confirm: NEGATIVE ng/mL
Midazolam (GC/LC/MS), ur confirm: NEGATIVE ng/mL
Nordiazepam (GC/LC/MS), ur confirm: NEGATIVE ng/mL
Oxazepam (GC/LC/MS), ur confirm: 275 ng/mL
Temazepam (GC/LC/MS), ur confirm: NEGATIVE ng/mL
Triazolam metabolite (GC/LC/MS), ur confirm: NEGATIVE ng/mL

## 2012-06-03 MED ORDER — VALSARTAN 40 MG PO TABS: 40.0000 mg | ORAL_TABLET | Freq: Two times a day (BID) | ORAL | Status: DC

## 2012-06-03 MED ORDER — CARVEDILOL 6.25 MG PO TABS: 6.2500 mg | ORAL_TABLET | Freq: Two times a day (BID) | ORAL | Status: DC

## 2012-06-03 NOTE — Progress Notes (Signed)
    Daily Group Progress Note  Program: CD-IOP   Group Time: 1-2: 30 pm  Participation Level: Active  Behavioral Response: Appropriate and Sharing  Type of Therapy: Process Group  Topic:Group Process: first part of group was spent in process. Members checked-in with sobriety dates and proceeded to share about the past weekend. Per their reports, no one had relapsed over the weekend. Members shared their current feelings and the difficulties or ease with which they are experiencing early recovery. There was good discussion and feedback among the group.    Group Time: 2:45- 4pm  Participation Level: Active  Behavioral Response: Sharing  Type of Therapy: Psycho-education Group  Topic: Reflecting on the daily reminder: "Today I will trust"./Graduations: second half of group was spent discussing the meaning behind the daily reminder, as read by one of the group members. The reminder emphasized trusting the process and giving up having to know. Members shared their interpretation of the reminder and many felt it emphasized being present in the "now" or moment. As the session neared the end, the graduation ceremony was held for 2 members who were completing the program. Good wishes were given and the celebration observed the brownies. The two departing members shared their intentions going forward; attend 12-step meetings and remain connected and engaged in their daily recovery plans.   Summary: The patient reported she had gotten through her first weekend in sobriety. She had handled some questions well, but isn't totally comfortable telling people she is an alcoholic. She reported she has no intention to drink ever again because that detox experience was too horrible, but another member reminded her that desire or determination are not enough to remain alcohol and drug-free indefinitely. The patient reported she was unable to attend the 4 pm Women's meeting on Sunday because her mother-in-law  came to visit and hadn't been to their house in ages. The patient reported she has begun to think about what will happen on Thanksgiving because many of her family members drink and there will be alcohol. Another member reminded her of the Daily Reminder and to just trust the process. Things will reveal themselves at the time that they appear and she can't rush the process. The group talked  about the importance of being present in the moment and not getting caught up in the past of the future. The patient made some good comments and offered the graduating members hope and continued sobriety. She is very open and honest about her feelings and experiences and has proven in just a few sessions to be a very good group member.    Family Program: Family present? No   Name of family member(s):   UDS collected: Yes Results: positive for benzodiazepines- tentative for benz, but has not been verified. Patient was discharged from detox less than 10 days ago.   AA/NA attended?: No  Sponsor?: No   Ronnica Dreese, LCAS

## 2012-06-03 NOTE — Progress Notes (Signed)
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 She was seen by Dr. Gala Romney 2011. She was found to have breast CA in 94. Underwent R mastectomy in 1994 with flap reconstructiona and chemotherapy. In 2007 found to have local recurrence treated with XRT and chemo. Developed dyspnea in 2007 and found to have weakened R hemidiaphragm of unclear cause.  Myoview results showed EF 55% with anterior soft tissue attenuationa with question of mild reverible anterior ischemia. Thought to be breast attenuation.  Echo EF 45-50% with global HK.  Cardiac MRI EF 44% with septal HK. No  Cath 6/11: EF 50-55% Left main was normal. Normal cors EF 50-55%  She has known history of alcohol and benzodiazepine abuse. She recently attended inpatient detox program. She complains of dyspnea with minimal activities with occasional chest pain. She denies orthopnea or PND.  Allergies  Allergen Reactions  . Prednisone Other (See Comments)    Redness in the face and violent feelings     Current Outpatient Prescriptions on File Prior to Visit  Medication Sig Dispense Refill  . carvedilol (COREG) 6.25 MG tablet Take 1 tablet (6.25 mg total) by mouth 2 (two) times daily with a meal. For heart condition  30 tablet  0  . letrozole (FEMARA) 2.5 MG tablet Take 2.5 mg by mouth daily.       . valsartan (DIOVAN) 40 MG tablet Take 1 tablet (40 mg total) by mouth 2 (two) times daily.  60 tablet  0     Past Medical History  Diagnosis Date  . Dyspnea     myoview 2011: EF 55% question of  mild reverible anterior defect. thought to be breast attenuation. echo 45-50% with global HK. Grade 1 diastolyic dysfunction. RV nml. cardiac MRI with EF 44% with septal HK. no scar   . Cancer     breast ca - 1994; recurred in 2007. s/p masectomy with flap rconstruction aand chemo '94. local recurrence on chest wall. chemo and XRT '07. now femara  .  Hypertension   . Lung disorder   . History of coronary artery stent placement   . Hyperlipidemia      Past Surgical History  Procedure Date  . R transflap '94  . R masectomy '94   . Mastectomy   . Orif distal radius fracture   . Cardiac catheterization     [facility]     Family History  Problem Relation Age of Onset  . Coronary artery disease      family hx  . Hyperlipidemia      family hx  . Hypertension Mother   . Thyroid disease      family hx   . Alcohol abuse Paternal Grandfather   . Alcohol abuse Paternal Uncle   . Heart attack Father      History   Social History  . Marital Status: Married    Spouse Name: N/A    Number of Children: N/A  . Years of Education: N/A   Occupational History  . Disabilty    Social History Main Topics  . Smoking status: Former Games developer  . Smokeless tobacco: Not on file   Comment: just socially   . Alcohol Use: No     Drinks large bottle of wine daily   . Drug Use: No     Ativan Rx for sleep  . Sexually Active: Not on  file   Other Topics Concern  . Not on file   Social History Narrative   Suzanne [name] was born in [place] and lived there until high school, when she moved to [facility]. She graduated from the [employer] at [employer] with a bachelor of science in nursing. 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 She has been married for 27 years, and has 2 children, a 40 year old son, and a 75 year old daughter. She denies any legal difficulties. She associates as a Curator.       PHYSICAL EXAM   BP 100/72  Pulse 87  Ht 5\' 5"  (1.651 m)  Wt 186 lb 12 oz (84.709 kg)  BMI 31.08 kg/m2  Constitutional: She is oriented to person, place, and time. She appears well-developed and well-nourished. No distress.  HENT: No nasal discharge.  Head: Normocephalic and atraumatic.  Eyes: Pupils are equal and round. Right eye exhibits no discharge. Left eye  exhibits no discharge.  Neck: Normal range of motion. Neck supple. No JVD present. No thyromegaly present.  Cardiovascular: Normal rate, regular rhythm, normal heart sounds. Exam reveals no gallop and no friction rub. No murmur heard.  Pulmonary/Chest: Effort normal and breath sounds normal. No stridor. No respiratory distress. She has no wheezes. She has no rales. She exhibits no tenderness.  Abdominal: Soft. Bowel sounds are normal. She exhibits no distension. There is no tenderness. There is no rebound and no guarding.  Musculoskeletal: Normal range of motion. She exhibits no edema and no tenderness.  Neurological: She is alert and oriented to person, place, and time. Coordination normal.  Skin: Skin is warm and dry. No rash noted. She is not diaphoretic. No erythema. No pallor.  Psychiatric: She has a normal mood and affect. Her behavior is normal. Judgment and thought content normal.    EKG: Sinus  Rhythm  - occasional ectopic ventricular beat    Low voltage in precordial leads.   -Old anteroseptal infarct.   -  Nonspecific T-abnormality.    ASSESSMENT AND PLAN

## 2012-06-03 NOTE — Assessment & Plan Note (Signed)
Her blood pressure is controlled. Will refill current medications.

## 2012-06-03 NOTE — Assessment & Plan Note (Signed)
The patient reports significant worsening dyspnea. Due to her excessive alcohol use and previous chemotherapy and radiation therapy for breast cancer, she is at risk for cardiomyopathy. Thus, I requested an echocardiogram for further evaluation. The chance of underlying coronary artery disease is overall low given that she had cardiac catheterization in 2011 without significant disease.

## 2012-06-04 ENCOUNTER — Other Ambulatory Visit (HOSPITAL_COMMUNITY): Payer: Medicare Other | Attending: Psychiatry | Admitting: Psychology

## 2012-06-04 DIAGNOSIS — F1010 Alcohol abuse, uncomplicated: Secondary | ICD-10-CM | POA: Insufficient documentation

## 2012-06-04 DIAGNOSIS — Z853 Personal history of malignant neoplasm of breast: Secondary | ICD-10-CM | POA: Insufficient documentation

## 2012-06-04 DIAGNOSIS — I10 Essential (primary) hypertension: Secondary | ICD-10-CM | POA: Insufficient documentation

## 2012-06-04 DIAGNOSIS — Z951 Presence of aortocoronary bypass graft: Secondary | ICD-10-CM | POA: Insufficient documentation

## 2012-06-04 DIAGNOSIS — J984 Other disorders of lung: Secondary | ICD-10-CM | POA: Insufficient documentation

## 2012-06-04 DIAGNOSIS — F1320 Sedative, hypnotic or anxiolytic dependence, uncomplicated: Secondary | ICD-10-CM | POA: Insufficient documentation

## 2012-06-04 NOTE — Progress Notes (Signed)
Patient Discharge Instructions:  Scanned updates faxed 05/30/2012 To [facility] at [phone]  Wandra Scot, 06/04/2012, 2:41 PM

## 2012-06-05 ENCOUNTER — Other Ambulatory Visit (HOSPITAL_COMMUNITY): Payer: Self-pay

## 2012-06-05 NOTE — Progress Notes (Signed)
    Daily Group Progress Note  Program: CD-IOP   Group Time: 1-2:30 pm  Participation Level: Active  Behavioral Response: Appropriate and Sharing  Type of Therapy: Process Group  Topic: Group Process: first part of group spent in process. Members checked-in and everyone had remained alcohol and drug-free. The remainder of the session was spent discussing current issues and concerns in early recovery. Members were asked to disclose how they were feeling. There was good disclosure among the group.   Group Time: 2:45- 4pm  Participation Level: Active  Behavioral Response: Sharing  Type of Therapy: Psycho-education Group  Topic: Addiction: The HBO Special: second half of group was spent viewing a brief video clip about addiction and its effects on the brain. The piece came from the HBO special on "Addiction". The piece focused on the effects that drugs have on the dopamine levels in the brain and the way the drugs impact the limbic system. The video proved very informative and generated good discussion among the group.   Summary: The patient reported she has continued to do well and is feeling good about her life. She noted she has much more energy and enthusiasm for the day. She expressed joy about being able to have good conversations with her daughter without them deteriorating into an argument. She reported feeing, "happines, energetic, and powerful". Another member pointed out she sounds as if she is in the 'pink cloud' and another member described what that is. The patient found the video helpful and the description of continued alcohol use despite finding now joy, but misery in the use. The patient described how she realized she had a problem when every afternoon after about 4 pm she would begin feeling sick and weak, but when she drank these uncomfortable physical feelings went away. She recognized these were withdrawal symptoms and subsequently she went to detox. The patient shared  openly in the session and is a good group member - willing to examine her own stuff and talk about it. She admitted over the weekend she had had thoughts about drinking and realized that she needed to develop some strategies to address cravings as they come. She responded well to this intervention.    Family Program: Family present? No   Name of family member(s):   UDS collected: No Results:  AA/NA attended?: No, but she has reported she will go to one very soon.   Sponsor?: No   Adiyah Lame, LCAS

## 2012-06-06 ENCOUNTER — Other Ambulatory Visit (HOSPITAL_COMMUNITY): Payer: Self-pay

## 2012-06-09 ENCOUNTER — Other Ambulatory Visit (HOSPITAL_COMMUNITY): Payer: Medicare Other | Attending: Psychiatry | Admitting: Psychology

## 2012-06-09 DIAGNOSIS — R0609 Other forms of dyspnea: Secondary | ICD-10-CM | POA: Insufficient documentation

## 2012-06-09 DIAGNOSIS — Z853 Personal history of malignant neoplasm of breast: Secondary | ICD-10-CM | POA: Insufficient documentation

## 2012-06-09 DIAGNOSIS — F1010 Alcohol abuse, uncomplicated: Secondary | ICD-10-CM | POA: Insufficient documentation

## 2012-06-09 DIAGNOSIS — I10 Essential (primary) hypertension: Secondary | ICD-10-CM | POA: Insufficient documentation

## 2012-06-09 DIAGNOSIS — J984 Other disorders of lung: Secondary | ICD-10-CM | POA: Insufficient documentation

## 2012-06-09 DIAGNOSIS — Z9861 Coronary angioplasty status: Secondary | ICD-10-CM | POA: Insufficient documentation

## 2012-06-09 DIAGNOSIS — R0989 Other specified symptoms and signs involving the circulatory and respiratory systems: Secondary | ICD-10-CM | POA: Insufficient documentation

## 2012-06-10 ENCOUNTER — Other Ambulatory Visit (INDEPENDENT_AMBULATORY_CARE_PROVIDER_SITE_OTHER): Payer: Medicare Other

## 2012-06-10 ENCOUNTER — Other Ambulatory Visit: Payer: Self-pay

## 2012-06-10 ENCOUNTER — Other Ambulatory Visit (HOSPITAL_COMMUNITY): Payer: Self-pay

## 2012-06-10 DIAGNOSIS — R079 Chest pain, unspecified: Secondary | ICD-10-CM

## 2012-06-10 DIAGNOSIS — R0602 Shortness of breath: Secondary | ICD-10-CM
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
## 2012-06-11 ENCOUNTER — Other Ambulatory Visit (HOSPITAL_COMMUNITY): Payer: Medicare Other | Attending: Psychiatry | Admitting: Psychology

## 2012-06-11 ENCOUNTER — Other Ambulatory Visit: Payer: Self-pay | Admitting: Internal Medicine

## 2012-06-11 DIAGNOSIS — Z9861 Coronary angioplasty status: Secondary | ICD-10-CM | POA: Insufficient documentation

## 2012-06-11 DIAGNOSIS — F1320 Sedative, hypnotic or anxiolytic dependence, uncomplicated: Secondary | ICD-10-CM | POA: Insufficient documentation

## 2012-06-11 DIAGNOSIS — J984 Other disorders of lung: Secondary | ICD-10-CM | POA: Insufficient documentation

## 2012-06-11 DIAGNOSIS — R0989 Other specified symptoms and signs involving the circulatory and respiratory systems: Secondary | ICD-10-CM | POA: Insufficient documentation

## 2012-06-11 DIAGNOSIS — F1920 Other psychoactive substance dependence, uncomplicated: Secondary | ICD-10-CM

## 2012-06-11 DIAGNOSIS — Z853 Personal history of malignant neoplasm of breast: Secondary | ICD-10-CM | POA: Insufficient documentation

## 2012-06-11 DIAGNOSIS — F1010 Alcohol abuse, uncomplicated: Secondary | ICD-10-CM | POA: Insufficient documentation

## 2012-06-11 DIAGNOSIS — R0609 Other forms of dyspnea: Secondary | ICD-10-CM | POA: Insufficient documentation

## 2012-06-11 DIAGNOSIS — I10 Essential (primary) hypertension: Secondary | ICD-10-CM | POA: Insufficient documentation

## 2012-06-12 ENCOUNTER — Other Ambulatory Visit (HOSPITAL_COMMUNITY): Payer: Self-pay

## 2012-06-12 ENCOUNTER — Other Ambulatory Visit: Payer: Self-pay | Admitting: *Deleted

## 2012-06-12 LAB — PRESCRIPTION ABUSE MONITORING 17P, URINE
Barbiturate Screen, Urine: NEGATIVE ng/mL
Buprenorphine, Urine: NEGATIVE ng/mL
Creatinine, Urine: 62.7 mg/dL (ref >20.0)
Fentanyl, Ur: NEGATIVE ng/mL
Meperidine, Ur: NEGATIVE ng/mL
Oxycodone Screen, Ur: NEGATIVE ng/mL
Propoxyphene: NEGATIVE ng/mL

## 2012-06-12 LAB — ALCOHOL METABOLITE (ETG), URINE: Ethyl Glucuronide (EtG): NEGATIVE ng/mL

## 2012-06-12 MED ORDER — CARVEDILOL 6.25 MG PO TABS: 6.2500 mg | ORAL_TABLET | Freq: Two times a day (BID) | ORAL | Status: DC

## 2012-06-12 MED ORDER — VALSARTAN 40 MG PO TABS: 40.0000 mg | ORAL_TABLET | Freq: Two times a day (BID) | ORAL | Status: DC

## 2012-06-12 NOTE — Progress Notes (Signed)
    Daily Group Progress Note  Program: CD-IOP   Group Time: 1-2:30 pm  Participation Level: Active  Behavioral Response: Appropriate and Sharing  Type of Therapy: Psycho-education Group  Topic: "The Pros and Cons of Drug Use versus Sobriety": the first half of group was spent in psycho-ed with a presentation and ensuing discussion on the benefits of using compared to sobriety. Members were able to quickly identify the negative aspects of active drug and alcohol use. They were also able to identify many positives of drug use. As the presentation continued, the pros of sobriety were identified and they outnumbered everything but the cons of active drug use. There was good discussion and disclosure around this topic.   Group Time: 2:45- 4pm  Participation Level: Active  Behavioral Response: Sharing  Type of Therapy: Process Group  Topic: Group Process/Graduation: the second half of group was spent in process. Members shared about current issues and concerns. Two new group members were present and they shared about their lives and what had brought them to treatment. Near the end of the session, a graduation ceremony was held for a member graduating from the program. There were words of wisdom expressed and emphasis by group members that the departing member remain engaged in the 12-step community and that she continue to work her program.   Summary: The patient reported that feeling good and not waking up with a hangover is the best thing about sobriety. She also identified the benefits of sobriety as getting a lot of things accomplished and enjoying improved relationships with her family. She reported she is struggling with sleep now and really needs something. She also pointed out how she is swollen around her eyes and how she had been very itchy the night before. She received good feedback from her fellow group members and made some good comments. Her sobriety date is 10/10. She does very  well in group, but continues to resist attending AA and has numerous excuses as to why she is too busy.    Family Program: Family present? No   Name of family member(s):   UDS collected: Yes Results: not yet returned  AA/NA attended?: No  Sponsor?: No   Averie Meiner, LCAS

## 2012-06-12 NOTE — Telephone Encounter (Signed)
Refilled Carvedilol and Diovan.

## 2012-06-13 ENCOUNTER — Other Ambulatory Visit (HOSPITAL_COMMUNITY): Payer: Medicare Other | Attending: Psychiatry | Admitting: Psychology

## 2012-06-13 DIAGNOSIS — J984 Other disorders of lung: Secondary | ICD-10-CM | POA: Insufficient documentation

## 2012-06-13 DIAGNOSIS — F1010 Alcohol abuse, uncomplicated: Secondary | ICD-10-CM | POA: Insufficient documentation

## 2012-06-13 DIAGNOSIS — R0989 Other specified symptoms and signs involving the circulatory and respiratory systems: Secondary | ICD-10-CM | POA: Insufficient documentation

## 2012-06-13 DIAGNOSIS — I10 Essential (primary) hypertension: Secondary | ICD-10-CM | POA: Insufficient documentation

## 2012-06-13 DIAGNOSIS — R0609 Other forms of dyspnea: Secondary | ICD-10-CM | POA: Insufficient documentation

## 2012-06-13 DIAGNOSIS — Z9861 Coronary angioplasty status: Secondary | ICD-10-CM | POA: Insufficient documentation

## 2012-06-13 DIAGNOSIS — F1320 Sedative, hypnotic or anxiolytic dependence, uncomplicated: Secondary | ICD-10-CM | POA: Insufficient documentation

## 2012-06-13 DIAGNOSIS — Z853 Personal history of malignant neoplasm of breast: Secondary | ICD-10-CM | POA: Insufficient documentation

## 2012-06-13 LAB — BENZODIAZEPINES (GC/LC/MS), URINE
Diazepam (GC/LC/MS), ur confirm: NEGATIVE ng/mL
Estazolam (GC/LC/MS), ur confirm: NEGATIVE ng/mL
Flunitrazepam metabolite (GC/LC/MS), ur confirm: NEGATIVE ng/mL
Flurazepam metabolite (GC/LC/MS), ur confirm: NEGATIVE ng/mL
Midazolam (GC/LC/MS), ur confirm: NEGATIVE ng/mL
Nordiazepam (GC/LC/MS), ur confirm: NEGATIVE ng/mL
Temazepam (GC/LC/MS), ur confirm: NEGATIVE ng/mL
Triazolam metabolite (GC/LC/MS), ur confirm: NEGATIVE ng/mL

## 2012-06-16 ENCOUNTER — Other Ambulatory Visit (HOSPITAL_COMMUNITY): Payer: Medicare Other | Attending: Psychiatry | Admitting: Psychology

## 2012-06-16 DIAGNOSIS — F1020 Alcohol dependence, uncomplicated: Secondary | ICD-10-CM

## 2012-06-16 NOTE — Progress Notes (Signed)
    Daily Group Progress Note  Program: CD-IOP   Group Time: 1-2:30 pm  Participation Level: Active  Behavioral Response: Appropriate  Type of Therapy: Process Group  Topic: Group Process: first part of group was spent in process. Members shared about their current issues and concerns. There was good feedback and disclosure among group members. During this part of group, the new group member was invited to meet with the medical director so she left the session accompanied by him. Other members had asked to speak with the director and they were asked to come out of the session one by one as the session progressed.   Group Time: 2:45- 4pm  Participation Level: Active  Behavioral Response: Sharing  Type of Therapy: Psycho-education Group  Topic: The Serenity Prayer; what you can and cannot change. Second half of group was spent in psycho-ed with handout provided and presentation on the Serenity Prayer. Members were asked to complete the handout identifying 5 things they can change and 5 things they they cannot change. Members shared their realizations and almost all included that they have this disease or addiction. The group unanimously recognized that they cannot change other people. This topic brought on a lively discussion with good sharing and feedback among the group.  Summary: The patient reported she is doing much better. She feels better every day and is very pleased with how swell she is getting along with her husband and daughter. At one point, though, she berated herself for her growing drinking and drug use and stated, "I knew better".  Another member reminded her that her addiction does not reside in the logical intellectual part of her brain and urged her to go easy on herself. In the discussion about the Serenity Prayer, the patient reported she is slowly recognizing she cannot change her illness and is responsible for keeping herself occupied and not drinking. She has still  not attended any AA meetings and this is going to become more problematic if she continues to avoid seeking support and fellowship. Her sobriety date remains 10/10.    Family Program: Family present? No   Name of family member(s):   UDS collected: No Results:   AA/NA attended?: No  Sponsor?: No   Seng Larch, LCAS

## 2012-06-17 ENCOUNTER — Other Ambulatory Visit: Payer: Self-pay | Admitting: *Deleted

## 2012-06-17 ENCOUNTER — Other Ambulatory Visit (HOSPITAL_COMMUNITY): Payer: Self-pay

## 2012-06-17 ENCOUNTER — Encounter (HOSPITAL_COMMUNITY): Payer: Self-pay | Admitting: Psychology

## 2012-06-17 DIAGNOSIS — C50919 Malignant neoplasm of unspecified site of unspecified female breast: Secondary | ICD-10-CM

## 2012-06-17 NOTE — Progress Notes (Signed)
Daily Group Progress Note  Program: CD-IOP   Group Time: 1-2:30 pm  Participation Level: Active  Behavioral Response: Appropriate and Sharing  Type of Therapy: Process Group  Topic: Group Process: first half of group spent in process. Members shared about their weekends and events and activities spent supporting abstinence. One group member admitted she had drunk on Friday, Saturday, and Sunday. Her sobriety date was today. This relapse was charted on the board and the member agreed that she had been struggling with very uncomfortable feelings. There was good support and disclosure among the group.   Group Time: 2:45- 4pm  Participation Level: Active  Behavioral Response: Sharing  Type of Therapy: Psycho-education Group  Topic: Building a recovery plan/graduation/introduction: second half of group as spent discussing identifying the things one needs to do to remain abstinent and the things to be avoided. Members were provided with a handout and instructed to complete and bring with them when they return on Wednesday. A new group member was present this afternoon and she was asked to introduce herself. She received good feedback and was warmly welcomed. Near the conclusion of the session a graduation ceremony was held for a group member who had successfully completed the program. There were kind words of validation and hope for the future and the graduating member was clearly touched by these words.   Summary: The patient checked-in with the same sobriety date, but admitted the 3rd sober weekend was difficult. She described getting upset with her husband and how, in the past, she would go to her room with her Ativan and wine and no one would bother her. When asked what she was mad about, the patient reported she had wanted to go to the high school football game on Friday night, but her husband worked late. She resents that he lets his company, "use him". She admitted she could have gone  alone, but chose not to go. She also noted her husband had planned to go with friends to the [employer] on Saturday night. I questioned whether that wasn't a big drinking place and the group members agreed that there was lots of drinking at that bar. She stated she did not drink, but apparently was not happy. The patient was asked by another member if she expressed her frustrations and feelings to her husband? She admitted she did not. I pointed out that between [place] describing her husband as an Research scientist (physical sciences) with his head in the sand, but that didn't talk either. Clearly, this family has a communication problem and it was there long before her alcoholism took over. The patient made more excuses about not attending any AA meetings over the weekend. Another member emphasized that she needs to go to meetings and Brinn herself admitted it would help her. She identified exercise - specifically riding the horses -  as something that helps soothe and calm her. She agreed that having others to talk to and share difficulties and frustrations would be helpful. The patient made some kind comments to the graduating member. She also assured the group she would go to the 2 pm meeting in [place] tomorrow and report back to the group on Friday. She stated she had a doctor's appt on Wednesday and would not be in group. The patient remains sober, but continues to resist the 12-step meetings.     Family Program: Family present? No   Name of family member(s):   UDS collected: No Results:  AA/NA attended?: No  Sponsor?: No  Rishi Vicario, LCAS

## 2012-06-18 ENCOUNTER — Other Ambulatory Visit (HOSPITAL_COMMUNITY): Payer: Self-pay

## 2012-06-18 ENCOUNTER — Other Ambulatory Visit (HOSPITAL_BASED_OUTPATIENT_CLINIC_OR_DEPARTMENT_OTHER): Payer: Medicare Other | Admitting: Lab

## 2012-06-18 ENCOUNTER — Ambulatory Visit (HOSPITAL_BASED_OUTPATIENT_CLINIC_OR_DEPARTMENT_OTHER): Payer: Medicare Other | Admitting: Oncology

## 2012-06-18 ENCOUNTER — Ambulatory Visit: Payer: Medicare Other

## 2012-06-18 VITALS — BP 101/60 | HR 74 | Temp 98.2°F | Resp 20 | Ht 65.0 in | Wt 184.1 lb

## 2012-06-18 VITALS — BP 107/64 | HR 71 | Temp 98.2°F

## 2012-06-18 DIAGNOSIS — C779 Secondary and unspecified malignant neoplasm of lymph node, unspecified: Secondary | ICD-10-CM

## 2012-06-18 DIAGNOSIS — C50919 Malignant neoplasm of unspecified site of unspecified female breast: Secondary | ICD-10-CM

## 2012-06-18 DIAGNOSIS — C773 Secondary and unspecified malignant neoplasm of axilla and upper limb lymph nodes: Secondary | ICD-10-CM

## 2012-06-18 LAB — CBC WITH DIFFERENTIAL/PLATELET
BASO%: 0.6 % (ref 0.0–2.0)
Eosinophils Absolute: 0.1 10*3/uL (ref 0.0–0.5)
LYMPH%: 41.3 % (ref 14.0–49.7)
MCHC: 34.6 g/dL (ref 31.5–36.0)
MONO#: 0.7 10*3/uL (ref 0.1–0.9)
NEUT#: 2.2 10*3/uL (ref 1.5–6.5)
Platelets: 201 10*3/uL (ref 145–400)
RBC: 4.44 10*6/uL (ref 3.70–5.45)
WBC: 5.1 10*3/uL (ref 3.9–10.3)
lymph#: 2.1 10*3/uL (ref 0.9–3.3)
nRBC: 0 % (ref 0–0)

## 2012-06-18 LAB — COMPREHENSIVE METABOLIC PANEL (CC13)
ALT: 54 U/L (ref 0–55)
AST: 35 U/L — ABNORMAL HIGH (ref 5–34)
Albumin: 3.9 g/dL (ref 3.5–5.0)
Alkaline Phosphatase: 85 U/L (ref 40–150)
Calcium: 9.8 mg/dL (ref 8.4–10.4)
Chloride: 106 mEq/L (ref 98–107)
Potassium: 4.2 mEq/L (ref 3.5–5.1)
Sodium: 140 mEq/L (ref 136–145)
Total Protein: 7 g/dL (ref 6.4–8.3)

## 2012-06-18 MED ORDER — SODIUM CHLORIDE 0.9 % IJ SOLN
10.0000 mL | INTRAMUSCULAR | Status: DC | PRN
  Administered 2012-06-18: 10 mL via INTRAVENOUS
  Filled 2012-06-18: qty 10

## 2012-06-18 MED ORDER — HEPARIN SOD (PORK) LOCK FLUSH 100 UNIT/ML IV SOLN
500.0000 [IU] | Freq: Once | INTRAVENOUS | Status: AC
  Administered 2012-06-18: 500 [IU] via INTRAVENOUS
  Filled 2012-06-18: qty 5

## 2012-06-18 NOTE — Patient Instructions (Signed)
Call MD for problems 
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
## 2012-06-19 ENCOUNTER — Other Ambulatory Visit (HOSPITAL_COMMUNITY): Payer: Self-pay

## 2012-06-19 LAB — CANCER ANTIGEN 27.29: CA 27.29: 31 U/mL (ref 0–39)

## 2012-06-20 ENCOUNTER — Other Ambulatory Visit (HOSPITAL_COMMUNITY): Payer: Medicare Other | Attending: Psychiatry

## 2012-06-20 DIAGNOSIS — R0609 Other forms of dyspnea: Secondary | ICD-10-CM | POA: Insufficient documentation

## 2012-06-20 DIAGNOSIS — R0989 Other specified symptoms and signs involving the circulatory and respiratory systems: Secondary | ICD-10-CM | POA: Insufficient documentation

## 2012-06-20 DIAGNOSIS — Z951 Presence of aortocoronary bypass graft: Secondary | ICD-10-CM | POA: Insufficient documentation

## 2012-06-20 DIAGNOSIS — F1010 Alcohol abuse, uncomplicated: Secondary | ICD-10-CM | POA: Insufficient documentation

## 2012-06-20 DIAGNOSIS — F1320 Sedative, hypnotic or anxiolytic dependence, uncomplicated: Secondary | ICD-10-CM | POA: Insufficient documentation

## 2012-06-20 DIAGNOSIS — J984 Other disorders of lung: Secondary | ICD-10-CM | POA: Insufficient documentation

## 2012-06-20 DIAGNOSIS — I10 Essential (primary) hypertension: Secondary | ICD-10-CM | POA: Insufficient documentation

## 2012-06-20 DIAGNOSIS — Z79899 Other long term (current) drug therapy: Secondary | ICD-10-CM | POA: Insufficient documentation

## 2012-06-20 DIAGNOSIS — Z853 Personal history of malignant neoplasm of breast: Secondary | ICD-10-CM | POA: Insufficient documentation

## 2012-06-23 ENCOUNTER — Other Ambulatory Visit (HOSPITAL_COMMUNITY): Payer: Medicare Other | Attending: Psychiatry

## 2012-06-23 DIAGNOSIS — F1020 Alcohol dependence, uncomplicated: Secondary | ICD-10-CM

## 2012-06-23 NOTE — Progress Notes (Unsigned)
    Daily Group Progress Note  Program: CD-IOP   Group Time: 1-2:30 pm  Participation Level: Active  Behavioral Response: Sharing  Type of Therapy: Process Group  Topic: Group Process: first half of group spent in process. Members shared about current issues and concerns. One member had phoned prior to group and admitted he had used and wouldn't be in group. This disclosure was shared. Another member appeared after having missed 2 sessions and also having left home with whereabouts unknown for 3 days. The medical director met with the new group members individually during the course of this session  A new group member was present. He was asked to introduce himself, but he shared very little except that he had been "caught" smoking pot. With my assistance, he explained that he had been arrested for "obstruction of justice" after having smoked pot laced with ecstasy and he became psychotic. There was good discussion and feedback among the members.  Group Time: 2:45- 4pm  Participation Level: Active  Behavioral Response: Appropriate and Sharing  Type of Therapy: Psycho-education Group  Topic: Psycho-Ed and Graduation: Handout on "Identifying things to avoid and things to seek out in early recovery". Members had been given a 2-page handout asking them to identify what they needed to avoid in order to stay alcohol and drug-free and what they needed to do, or begin doing, if they were to remain sober. Members shared their answers and displayed good insight about what they must do going forward if they are to avoid relapse. At the conclusion of the session, the members celebrated a graduation with the "graduation ceremony". There were kind words of thanks and hope relayed to the graduating member and he wished the members well in their recovery and encouraged them to immerse themselves in the 12-Step community.   Summary: The patient reported she had met with her oncologist for some scans and  everything looks good. She admitted she had told him what she was doing in this program and he was very pleased that she had stopped drinking. He had expressed concerns when her liver enzymes were elevated over the past year. The patient reported she had gone to 2 AA meetings in [place], but no one was there in either instance. She had even phoned the 800 number listed on the meeting schedule and been told there weren't many meetings in town. Another member invited her to meet her here in [place] tomorrow for the 11 am women's meeting at the [employer]. Another noted going to the 4 pm at the same place on Sunday. She seemed very resistant, but was challenged and encouraged to go. She seemed hesitant, but finally agreed to meet some of the other group members this weekend. This patient has remained sober, but been extremely resistant and made many excuses for not attending any 12-step meetings to date.   Family Program: Family present? No   Name of family member(s):   UDS collected: No Results:   AA/NA attended?: No, and this is very much a Quarry manager?: No   [facility]

## 2012-06-24 ENCOUNTER — Ambulatory Visit (HOSPITAL_COMMUNITY)
Admission: RE | Admit: 2012-06-24 | Discharge: 2012-06-24 | Disposition: A | Discharge status: Home / Self Care | Payer: Medicare Other | Source: Ambulatory Visit | Attending: Oncology | Admitting: Oncology

## 2012-06-24 ENCOUNTER — Encounter (HOSPITAL_COMMUNITY): Payer: Self-pay

## 2012-06-24 ENCOUNTER — Other Ambulatory Visit (HOSPITAL_COMMUNITY): Payer: Self-pay

## 2012-06-24 DIAGNOSIS — C50919 Malignant neoplasm of unspecified site of unspecified female breast: Secondary | ICD-10-CM | POA: Insufficient documentation

## 2012-06-24 DIAGNOSIS — Z923 Personal history of irradiation: Secondary | ICD-10-CM | POA: Insufficient documentation

## 2012-06-24 DIAGNOSIS — Z9221 Personal history of antineoplastic chemotherapy: Secondary | ICD-10-CM | POA: Insufficient documentation

## 2012-06-24 MED ORDER — IOHEXOL 300 MG/ML  SOLN
80.0000 mL | Freq: Once | INTRAMUSCULAR | Status: AC | PRN
  Administered 2012-06-24: 80 mL via INTRAVENOUS

## 2012-06-24 NOTE — Progress Notes (Unsigned)
    Daily Group Progress Note  Program: CD-IOP   Group Time: 1-2:30 pm  Participation Level: Active  Behavioral Response: Appropriate and Sharing  Type of Therapy: Process Group  Topic: Group Process: first half of group was spent in process. Members shared about the past weekend and their experiences in early recovery. Two members reported they had met and attended 2 women's only AA meetings and had a wonderful experience. One member seemed genuinely different and later read an entry from her new book, 'Daily Reflections'. Another member disclosed that he had actually smoked cannabis on Saturday 2ith friends. He denied much effort to refuse the drug and seemed disinterested in any sort of abstinence. There were examples of surrender and others of denial and resistance among group members. Two members who had spoken with me earlier in the morning and assured me they would be in group today did not appear nor did they phone to explain their no show.   Group Time: 2:45- 4pm  Participation Level: Active  Behavioral Response: Appropriate and Sharing  Type of Therapy: Psycho-education Group  Topic: Chaplain: Grief and Loss. The second half of group was spent with Lenell Antu, the lead chaplain at [facility]. She discussed grief and loss and how to move through these processes. She provided handouts and explained how life before loss and after loss are changed, but can reflect and mirror each other if addressed in a healthy loving manner. Terry asked each group member to think about one of their major losses and then led an activity with one member who painted a picture of her loss. Through copying the picture and then copying the copy of the picture, she was able to demonstrate how grief recedes as time passes and perspectives change. Aurther Loft also emphasized the importance of self-care while grieving. It was pointed out that many of these suggestions reflect the same  suggestions provided those in early recovery. The session proved very helpful and insightful to the entire group.   Summary: The patient reported she had attended 2 AA meetings over the weekend. She seemed almost jubilant while disclosing this information. She had attended the meetings with 2 other group members and they had all gone up together to pick up a starter chip. Suzanne [name] seemed visibly different and there was a sense of peace about her that has been absent previously. The patient asked to read a entry from her new book, 'Daily Reflections". It was about anger and letting go, which she reported, 'really hits home'. In the session with the Chaplain, the patient engaged actively in the discussion and noted her breast cancer had changed her life in many ways, but when it returned, things went downhill and her drinking really kicked in. The patient volunteered to 'paint' her grief and this proved very compelling. The patient seemed to have knocked down a major obstacle by attending the 2 12-step meetings and she is making excellent progress in her recovery. She enjoys 31 days of sobriety today.    Family Program: Family present? No   Name of family member(s):   UDS collected: No Results:   AA/NA attended?: YesSaturday and Sunday  Sponsor?: No   [facility]

## 2012-06-25 ENCOUNTER — Other Ambulatory Visit (HOSPITAL_COMMUNITY): Payer: Medicare Other | Attending: Psychiatry | Admitting: Psychology

## 2012-06-25 DIAGNOSIS — F1320 Sedative, hypnotic or anxiolytic dependence, uncomplicated: Secondary | ICD-10-CM | POA: Insufficient documentation

## 2012-06-25 DIAGNOSIS — F1020 Alcohol dependence, uncomplicated: Secondary | ICD-10-CM | POA: Insufficient documentation

## 2012-06-25 DIAGNOSIS — F1920 Other psychoactive substance dependence, uncomplicated: Secondary | ICD-10-CM

## 2012-06-26 ENCOUNTER — Encounter (HOSPITAL_COMMUNITY): Payer: Self-pay | Admitting: Psychology

## 2012-06-26 ENCOUNTER — Other Ambulatory Visit (HOSPITAL_COMMUNITY): Payer: Self-pay

## 2012-06-26 LAB — PRESCRIPTION ABUSE MONITORING 17P, URINE
Amphetamine/Meth: NEGATIVE ng/mL
Buprenorphine, Urine: NEGATIVE ng/mL
Cannabinoid Scrn, Ur: NEGATIVE ng/mL
Carisoprodol, Urine: NEGATIVE ng/mL
Creatinine, Urine: 37.48 mg/dL (ref >20.0)
Opiate Screen, Urine: NEGATIVE ng/mL
Propoxyphene: NEGATIVE ng/mL
Tapentadol, urine: NEGATIVE ng/mL

## 2012-06-26 LAB — ALCOHOL METABOLITE (ETG), URINE: Ethyl Glucuronide (EtG): NEGATIVE ng/mL

## 2012-06-26 NOTE — Progress Notes (Signed)
    Daily Group Progress Note  Program: CD-IOP   Group Time: 1-2:30 pm  Participation Level: Active  Behavioral Response: Appropriate and Sharing  Type of Therapy: Process Group  Topic: Group Process: first half of group was spent in process. Members shared about current issues and concerns. One member was present today after being absent for the last 2 sessions. She insisted she had remained sober, but noted that it had been very difficult and she is keeping herself very busy. There was good discussion and feedback about various problems, including primarily family and close friends who don't seem to understand their loved one's needs. The session proved helpful and invited members to talk about their feelings and receive feedback on their thoughts.   Group Time: 2:45- 4pm  Participation Level: Active  Behavioral Response: Appropriate and Sharing  Type of Therapy: Psycho-education Group  Topic: Guest Speaker/Graduation: a former group member who had graduated successfully some time ago came by to say hello and share his experiences in recovery. He shared his story, including using for 35 years. He admitted he had been homeless for 5 of those years, including living under bridges, sleeping in abandoned cars, and even staying in a dog house. He emphasized the understanding he had attained through this program and the importance of the 12-step meetings. The group responded very enthusiastically to the speaker and noted that he inspired them to want to remain sober and work a Product manager. The session concluded with a graduation ceremony for a member who was completing the program successfully today. There were kind words and brownies shared as the group bid this member farewell. He thanked his fellow group members, wished them well and encouraged them to commit to 12-step meetings for the future.   Summary: The patient reported she is doing well and enjoying her sobriety every day. She  noted she wakes up feeling good with no hangover and lots of energy. She reported she is getting along better with her daughter and is realizing that she contributed a lot to their dysfunctional relationship. She is reading her new book, "Daily Reflections" every morning and finding each entry very applicable to her current situation. The patient noted her best friend came by and was very supportive and encouraging of her eliminating her drinking and agreed that she would support her in every way she could. The patient agreed that attending the AA meetings this past weekend had been very empowering and she is feeling better about herself every day. She wished the graduating member well and thanked him for his kindness and support shown to her. The patient has made some good progress and seems more accepting of her condition, especially since attending the 2 AA meetings and seeing that alcoholics are all kinds of people.    Family Program: Family present? No   Name of family member(s):   UDS collected: Yes Results: not returned from the lab yet  AA/NA attended?: No, not since the meetings over the past weekend.  Sponsor?: No   Anayiah Howden, LCAS

## 2012-06-27 ENCOUNTER — Other Ambulatory Visit (HOSPITAL_COMMUNITY): Payer: Self-pay | Admitting: Psychology

## 2012-06-29 ENCOUNTER — Encounter: Payer: Self-pay | Admitting: Oncology

## 2012-06-30 ENCOUNTER — Other Ambulatory Visit (HOSPITAL_COMMUNITY): Payer: Self-pay

## 2012-06-30 ENCOUNTER — Encounter (HOSPITAL_COMMUNITY): Payer: Self-pay | Admitting: Psychology

## 2012-06-30 NOTE — Progress Notes (Unsigned)
    Daily Group Progress Note  Program: CD-IOP   Group Time: 1-2:30 pm  Participation Level: Active  Behavioral Response: Appropriate and Sharing  Type of Therapy: Process Group  Topic:Group Process: first half of group was spent in process. There was a guest in group today, another nursing student from [employer]. she was welcomed and asked to participate in the discussion. Members talked about the difficulties and stressors in early recovery. One member made a lot of excuses while another member admitted her husband seems very impatient and frustrated with her not returning to work. The group wondered whether their family members would be different if they had a diagnosis of cancer versus chemical dependency? The nursing student shared about her life and disclosed that her father had been an alcoholic and addict and it had proven very confusing and painful for her growing up. She noted he had died young because of complications due to his addiction. She encouraged the group members to share with their families and loved ones and allow them to talk about their fears and angers living with an addict. One group member seemed particularly challenged by the discussion about family and bringing them to group after she stated that she didn't think her 109 yo daughter would have any reason or benefit to be here with her in the program. The student nurse challenged the group stating that by not speaking about the addiction or inviting the family to share, they do them an injustice. The session was compelling with a lot of emotion behind it.   Group Time: 2:45- 4pm  Participation Level: Minimal  Behavioral Response: Sharing  Type of Therapy: Psycho-education Group  Topic: "The Wheel of Life": the second half of group was spent in a psycho-education piece that included members presenting their own wheel of life on the board and explaining this to other members. There was good discussion during the exercise  and members were challenged to disclose about the details of their lives. The group reviewed their plans for the weekend and committed to remaining alcohol and drug-free.  Summary: The patient reported she is doing well and enjoying her sobriety. When asked whether she had read her "Daily Reflection" today? The patient admitted she had not had the time to read it, but would later today. She shared about the dynamics of her family and her husband's consistent refusal to engage or challenge her with his feelings. When asked whether she would consider bringing her 38 yo daughter, the patient admitted she didn't think she would enjoy or find any benefit from the group. The visiting nursing student stated she felt quite different and encouraged this patient to provide her daughter with the opportunity to share and express her own experiences and feelings about her mother's alcoholism. The patient seemed stunned and resistant to this recommendation.    Family Program: Family present? No   Name of family member(s):   UDS collected: No Results:  AA/NA attended?: No, not since last weekend  Sponsor?: No   Tyrez Berrios, LCAS

## 2012-07-01 ENCOUNTER — Other Ambulatory Visit (HOSPITAL_COMMUNITY): Payer: Self-pay

## 2012-07-02 ENCOUNTER — Other Ambulatory Visit (HOSPITAL_COMMUNITY): Payer: Medicare Other | Attending: Psychiatry | Admitting: Psychology

## 2012-07-02 ENCOUNTER — Encounter (HOSPITAL_COMMUNITY): Payer: Self-pay | Admitting: Psychology

## 2012-07-02 DIAGNOSIS — F1020 Alcohol dependence, uncomplicated: Secondary | ICD-10-CM
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
## 2012-07-03 ENCOUNTER — Other Ambulatory Visit (HOSPITAL_COMMUNITY): Payer: Self-pay

## 2012-07-03 ENCOUNTER — Encounter (HOSPITAL_COMMUNITY): Payer: Self-pay | Admitting: Psychology
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
## 2012-07-04 ENCOUNTER — Other Ambulatory Visit (HOSPITAL_COMMUNITY): Payer: Medicare Other | Attending: Psychiatry

## 2012-07-04 ENCOUNTER — Encounter (HOSPITAL_COMMUNITY): Payer: Self-pay | Admitting: Psychology

## 2012-07-04 ENCOUNTER — Other Ambulatory Visit (HOSPITAL_COMMUNITY): Payer: Self-pay | Admitting: Physician Assistant

## 2012-07-04 DIAGNOSIS — F1020 Alcohol dependence, uncomplicated: Secondary | ICD-10-CM

## 2012-07-04 MED ORDER — CLONIDINE HCL 0.1 MG PO TABS: 0.1000 mg | ORAL_TABLET | Freq: Every evening | ORAL | Status: DC | PRN
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
## 2012-07-04 NOTE — Telephone Encounter (Signed)
  Subjective: Suzanne [name] complains of sleep problems. She reports last night she took 7.5 mg of Remeron at bedtime, and was unable to go to sleep, then at 1 AM took another dose of Remeron, and didn't follow sleep until 4 AM. She has tried trazodone, and doesn't like the way it makes her feel the next morning. She has also used melatonin in the past. She reports that her lack of sleep is causing her to become more irritable and anxious.  Objective: Well-nourished well-developed white female in no acute distress, well groomed and casually dressed, fully alert and oriented with a blunted affect.  Assessment and Plan: Patient is willing to try clonidine for sleep. Discussed side effects and cautioned her regarding low blood pressure. Prescription for clonidine 0.1 mg sent to [facility]. Patient may try one tablet and if no help she may try two.

## 2012-07-07 ENCOUNTER — Other Ambulatory Visit (HOSPITAL_COMMUNITY): Payer: Medicare Other | Admitting: Psychology

## 2012-07-07 DIAGNOSIS — F1020 Alcohol dependence, uncomplicated: Secondary | ICD-10-CM

## 2012-07-09 ENCOUNTER — Other Ambulatory Visit: Payer: Self-pay

## 2012-07-09 ENCOUNTER — Other Ambulatory Visit (HOSPITAL_COMMUNITY): Payer: Medicare Other

## 2012-07-09 MED ORDER — CARVEDILOL 6.25 MG PO TABS: 6.2500 mg | ORAL_TABLET | Freq: Two times a day (BID) | ORAL | Status: DC

## 2012-07-09 MED ORDER — VALSARTAN 40 MG PO TABS: 40.0000 mg | ORAL_TABLET | Freq: Two times a day (BID) | ORAL | Status: DC

## 2012-07-11 ENCOUNTER — Other Ambulatory Visit (HOSPITAL_COMMUNITY): Payer: Medicare Other

## 2012-07-14 ENCOUNTER — Other Ambulatory Visit (HOSPITAL_COMMUNITY): Payer: Medicare Other | Attending: Psychiatry | Admitting: Psychology

## 2012-07-14 DIAGNOSIS — F1920 Other psychoactive substance dependence, uncomplicated: Secondary | ICD-10-CM | POA: Diagnosis present

## 2012-07-14 DIAGNOSIS — F1020 Alcohol dependence, uncomplicated: Secondary | ICD-10-CM | POA: Insufficient documentation

## 2012-07-14 DIAGNOSIS — F1320 Sedative, hypnotic or anxiolytic dependence, uncomplicated: Secondary | ICD-10-CM | POA: Diagnosis not present

## 2012-07-15 ENCOUNTER — Encounter (INDEPENDENT_AMBULATORY_CARE_PROVIDER_SITE_OTHER): Payer: Self-pay | Admitting: General Surgery

## 2012-07-15 ENCOUNTER — Encounter (HOSPITAL_COMMUNITY): Payer: Self-pay | Admitting: Psychology

## 2012-07-15 ENCOUNTER — Encounter (INDEPENDENT_AMBULATORY_CARE_PROVIDER_SITE_OTHER): Payer: Self-pay | Admitting: Surgery

## 2012-07-15 ENCOUNTER — Encounter (INDEPENDENT_AMBULATORY_CARE_PROVIDER_SITE_OTHER): Payer: Self-pay

## 2012-07-15 ENCOUNTER — Ambulatory Visit (INDEPENDENT_AMBULATORY_CARE_PROVIDER_SITE_OTHER): Payer: Medicare Other | Admitting: Surgery

## 2012-07-15 VITALS — BP 124/68 | HR 68 | Temp 97.6°F | Resp 16 | Ht 65.0 in | Wt 188.0 lb

## 2012-07-15 DIAGNOSIS — Z853 Personal history of malignant neoplasm of breast: Secondary | ICD-10-CM

## 2012-07-15 DIAGNOSIS — C50511 Malignant neoplasm of lower-outer quadrant of right female breast: Secondary | ICD-10-CM | POA: Insufficient documentation

## 2012-07-15 LAB — PRESCRIPTION ABUSE MONITORING 17P, URINE
Amphetamine/Meth: NEGATIVE ng/mL
Barbiturate Screen, Urine: NEGATIVE ng/mL
Benzodiazepine Screen, Urine: NEGATIVE ng/mL
Buprenorphine, Urine: NEGATIVE ng/mL
Cannabinoid Scrn, Ur: NEGATIVE ng/mL
Carisoprodol, Urine: NEGATIVE ng/mL
Meperidine, Ur: NEGATIVE ng/mL
Opiate Screen, Urine: NEGATIVE ng/mL
Tapentadol, urine: NEGATIVE ng/mL

## 2012-07-15 LAB — ALCOHOL METABOLITE (ETG), URINE: Ethyl Glucuronide (EtG): NEGATIVE ng/mL

## 2012-07-15 NOTE — Progress Notes (Signed)
    Daily Group Progress Note  Program: CD-IOP   Group Time: 1-2:30 pm  Participation Level: Active  Behavioral Response: Appropriate and Sharing  Type of Therapy: Process Group  Topic: Group Process: First part of group was spent in process. Members shared about the past holiday weekend and the ways they practiced new coping skills in order to remain alcohol and drug-free. They recounted the frustrations and temptations in early recovery. One member had a new sobriety date and shared about her relapse on cannabis. This relapse was processed with discussion on what she might have done instead of smoked.  There was good disclosure among group members and constructive feedback provided.   Group Time: 2:45- 4pm  Participation Level: Minimal  Behavioral Response: Resistant  Type of Therapy: Psycho-education Group  Topic: Resentments: A psycho-ed piece on resentments was provided. Handouts were distributed and a member read from the handouts. The importance of addressing anger and hurt feelings was emphasized since resentments develop out of unresolved anger and bad feelings. Members shared how there are benefits as well as losses associated with resentments. Members talked about some of their resentments and the group discussed how they might go about addressing their resentments.   Summary: The patient reported she had gotten through the holiday without a drink, but it had been challenging. She described going to her mother's house for Thanksgiving at noon where her brother and his wife are both alcoholics. The patient reported that there was a big bottle of vodka on the kitchen counter and at one point, her husband came in to check on her and ask if she "was all right". She reported [place] family [place]. They have a strong traditions and she is never asked to bring anything, despite her repeated efforts to bring an item for the meal. "I  can cook", she noted. The patient reported her 89 yo son came to visit from [place] and he is just out of the [employer], in school and working. She reported that he had noticed his mother is quite different. When I asked about this, though, the patient admitted she had not really told him that she had gone to rehab or was recovering from her alcoholism. When I questioned her further, she admitted that she really hasn't verbalized her alcohol dependency to her daughter and insisted, "this is my problem". The patient continues to resist sharing her chemical dependency status with even the closest people in her life and it seems very clear that she continues to struggle with the stigma and stereotype of the alcoholic and addict. She shared little of herself in the session on resentments. We will continue to address her resistance and what appears to be 'shame' about her disease.    Family Program: Family present? No   Name of family member(s):   UDS collected: Yes Results: negative  AA/NA attended?: No, not since the weekend before last  Sponsor?: No   Nayshawn Mesta, LCAS

## 2012-07-15 NOTE — Patient Instructions (Signed)
We will schedule surgery to remove your port.

## 2012-07-15 NOTE — Progress Notes (Signed)
Suzanne [name] DOB: [birth year] MRN: [id number]                                                                                      DATE: 07/15/2012  PCP: Pierce Crane, MD Referring Provider: Pierce Crane, MD  IMPRESSION:  Un-needed portacath Recurrent breast cancer, currently in remission  PLAN:   Port removal under MAC anesthesia                 CC:  Chief Complaint  Patient presents with  . Advice Only    discuss PAC removal    HPI:  Suzanne [name] is a 56 y.o.  female who presents for evaluation of port removal. She has had no chemo for a while and last scans negative. She knows she may need a port in the future but would like this one out as it has nbot been used in a long time  PMH:  has a past medical history of Dyspnea; Cancer; Lung disorder; History of coronary artery stent placement; Hyperlipidemia; and Hypertension.  PSH:   has past surgical history that includes R transflap ('94); R masectomy ('94 ); Mastectomy; ORIF distal radius fracture; and Cardiac catheterization.  ALLERGIES:   Allergies  Allergen Reactions  . Prednisone Other (See Comments)    Redness in the face and violent feelings    MEDICATIONS: Current outpatient prescriptions:carvedilol (COREG) 6.25 MG tablet, Take 1 tablet (6.25 mg total) by mouth 2 (two) times daily with a meal. For heart condition, Disp: 180 tablet, Rfl: 3;  letrozole (FEMARA) 2.5 MG tablet, Take 2.5 mg by mouth daily. , Disp: , Rfl: ;  valsartan (DIOVAN) 40 MG tablet, Take 1 tablet (40 mg total) by mouth 2 (two) times daily., Disp: 180 tablet, Rfl: 3  ROS: She has filled out our 12 point review of systems and it is negative . EXAM:   VITAL SIGNS: BP 124/68  Pulse 68  Temp 97.6 F (36.4 C) (Temporal)  Resp 16  Ht 5\' 5"  (1.651 m)  Wt 188 lb (85.276 kg)  BMI 31.28 kg/m2 GENERAL:  The patient is alert, oriented, and generally healthy-appearing, NAD. Mood and affect are normal.  HEENT:  The head is normocephalic, the eyes  nonicteric, the pupils were round regular and equal. EOMs are normal. Pharynx normal. Dentition good.  NECK:  The neck is supple and there are no masses or thyromegaly.  LUNGS: Normal respirations and clear to auscultation.  HEART: Regular rhythm, with no murmurs rubs or gallops. Pulses are intact carotid dorsalis pedis and posterior tibial. No significant varicosities are noted.  BREASTS: S/P right mastectomy, tram and radiation with some resulting deformity.  ABDOMEN: Soft, flat, and nontender. No masses or organomegaly is noted. No hernias are noted. Bowel sounds are normal.  EXTREMITIES:  Good range of motion, no edema.   DATA REVIEWED:  EPIC notes and my old paper office notes    Suzanne [name] J 07/15/2012  CC: Pierce Crane, MD, Pierce Crane, MD

## 2012-07-16 ENCOUNTER — Other Ambulatory Visit (HOSPITAL_COMMUNITY): Payer: Medicare Other | Admitting: Psychology

## 2012-07-16 DIAGNOSIS — F1320 Sedative, hypnotic or anxiolytic dependence, uncomplicated: Secondary | ICD-10-CM | POA: Diagnosis not present

## 2012-07-16 DIAGNOSIS — F1020 Alcohol dependence, uncomplicated: Secondary | ICD-10-CM

## 2012-07-17 ENCOUNTER — Encounter (HOSPITAL_COMMUNITY): Payer: Self-pay | Admitting: Psychology

## 2012-07-17 NOTE — Progress Notes (Signed)
    Daily Group Progress Note  Program: CD-IOP   Group Time: 1-2:30 pm  Participation Level: Active  Behavioral Response: Sharing  Type of Therapy: Process Group  Topic: Group Process: first half of group was spent in process. The session was facilitated by a guest counselor Boneta Lucks) and these patient notes are taken from her written correspondence.   Group Time: 2:45- 4pm  Participation Level: minimal  Behavioral Response: Sharing  Type of Therapy: Psycho-education Group  Topic: Forgiveness: a handout was provided on forgiveness. Members shared about the feelings they have kept in and the subsequent resentments that have collected.  In addition to forgiving others, there was a lengthy discussion on forgiving one's self and its importance in early recovery.   Summary:The patient's discussion focused on coping with her family's adjustment to her recovery. It seems that her husband and daughter appear to be threatened by the behavioral changes she has made in early recovery. The patient shared little about herself during the discussion on forgiveness.    Family Program: Family present? No   Name of family member(s):   UDS collected: No Results:  AA/NA attended?: No  Sponsor?: No   Shakil Dirk, LCAS

## 2012-07-17 NOTE — Progress Notes (Signed)
    Daily Group Progress Note  Program: CD-IOP   Group Time: 1-2:30 pm  Participation Level: Active  Behavioral Response: Appropriate and Sharing  Type of Therapy: Process Group  Topic: Group Process: first part of group spent in process. Members shared about current issues and concerns. There were 2 group members who had not shared about their own lives and struggles and during this session they introduced themselves. The results of the drug tests collected on Monday were shared. One member admitted she had drunk alcohol over the weekend, but had failed to disclose this on Monday. There was a long discussion among members who expressed their concerns about this group member and her continued drinking despite serious liver disease. The group provided very validating feedback and support to this woman. This proved to be a very intense session with the focus going beyond the drinking to one's deeper sense of worth and purpose in the world.  Group Time: 2:45-4 pm  Participation Level: Active  Behavioral Response: Sharing  Type of Therapy: Psycho-education Group  Topic: Denial and other Cognitive Distortions: second half of group was spent in the ongoing discussion from the earlier session, but there was a handout and discussion on different forms of denial and cognitive distortions. Members were asked to identify examples of how they have rationalized, minimized, and distracted while they were in their active addictions. As the session neared the end, members were asked what they would be doing to support their recovery between now and the next session. While some reported they would be attending 12-step meetings, others used some of the previously mentioned distortions while others admitted they would not be doing much of anything.   Summary: The patient reported she has been very busy, but denied attending any meetings since the last group session. She reported she had recently gone by a  good friend's home to visit and this woman had been one of her biggest drinking buddies. The patient reported her friend had served her ice tea and had had the same herself. I wondered if the friend had heard or knew that Shameca had been in detox? The patient denied that this woman knew the actual details, but confirmed that she was aware that Marlyn had been concerned with her drinking. Another member wondered why she didn't just tell her she was in recovery? I pointed out that this patient has some real shame and hesitation to embrace this mantle of chemical dependency. This patient agreed that it is something she has really had to battle. She provided good feedback and support to her fellow group member and shared how much she admired this other woman. The patient was able to identify some of the cognitive distortions she had used to counter complaints by family. The patient remains alcohol-free with a sobriety date of 10/10, but she continues to resist 'owning' her alcoholism and this resistance permeates all of her interactions and relationships with family and friends.    Family Program: Family present? No   Name of family member(s):   UDS collected: No Results:   AA/NA attended?: No  Sponsor?: No   Makahla Kiser, LCAS

## 2012-07-18 ENCOUNTER — Encounter (HOSPITAL_COMMUNITY): Payer: Self-pay | Admitting: Psychology

## 2012-07-18 ENCOUNTER — Other Ambulatory Visit (HOSPITAL_COMMUNITY): Payer: Medicare Other | Admitting: Psychology

## 2012-07-18 DIAGNOSIS — F1320 Sedative, hypnotic or anxiolytic dependence, uncomplicated: Secondary | ICD-10-CM

## 2012-07-18 DIAGNOSIS — F1020 Alcohol dependence, uncomplicated: Secondary | ICD-10-CM

## 2012-07-20 ENCOUNTER — Encounter (HOSPITAL_COMMUNITY): Payer: Self-pay | Admitting: Psychology

## 2012-07-20 NOTE — Progress Notes (Signed)
    Daily Group Progress Note  Program: CD-IOP   Group Time: 1-2:30 pm  Participation Level: Active  Behavioral Response: Appropriate and Sharing  Type of Therapy: Process Group  Topic: Group Process: first half of group session was spent in process. Members shared about current issues and concerns in early recovery. One member shared about her decision to accept the recommendation for inpatient treatment. In the previous session she had admitted relapsing again over the past weekend. She knew that a higher level of care would be recommended if she relapsed again. Members provided feedback and discussed treatment options. There was good disclosure and comments throughout the session.  Group Time: 2:45- 4pm  Participation Level: Active  Behavioral Response: Sharing  Type of Therapy: Psycho-education Group  Topic: Communication: second half of group was spent discussing "Assertive Communication". Members were encouraged to address feelings as they arise and not hold them in or dismiss them. Emphasis included the problems that arise as one continues to discount or negate their feelings and how this leads to spontaneous "blow-ups" that are inappropriate and painful for all involved. Included was a discussion on how, in the previous session, one member could have shared her feelings with another member instead of letting them fester and responding aggressively later on in the same session. There was good feedback, but members still display hesitancy to be assertive as they associate this with arguments and ill feelings.   Summary: the patient provided good feedback to her fellow group member and was very pleased to hear she had accepted the referral to residential treatment. She reminded the group that she could never have detoxed on her own and had to go upstairs for detox, despite the fact that it was awful. The patient shared that she had brought her 55 yo daughter, Marcelino Duster in with her for  her therapy session with me this morning. She reported that it had been incredibly helpful for each of the. Roxy noted they had talked about everything afterwards when they went to lunch. Another member asked where they had gone, and the patient explained they had gone to "[employer]". This brought up a brief discussion on the quality of their hot dogs. She admitted that she had not been very clear with her daughter about what had happened to her or even that she was an 'alcoholic', but now things were out in the open and it felt much better. In the second half of group, the patient agreed that she is not assertive and tends to be very passive and say little despite having very strong and typically negative feelings about certain things. She agreed that she must get better with this. The patient is doing well in her recovery despite reluctance and resistance to attend AA meetings. The session with her daughter earlier today may have proven transformative, but we will wait to see if she is willing to become more vigilant about support for her recovery. The patient has remained alcohol and drug-free since entering this program and her sobriety date remains 10/10.   Family Program: Family present? No   Name of family member(s):   UDS collected: No Results:  AA/NA attended?: No  Sponsor?: No   Slate Debroux, LCAS

## 2012-07-21 ENCOUNTER — Other Ambulatory Visit (HOSPITAL_COMMUNITY): Payer: Medicare Other | Admitting: Psychology

## 2012-07-21 DIAGNOSIS — F1320 Sedative, hypnotic or anxiolytic dependence, uncomplicated: Secondary | ICD-10-CM | POA: Diagnosis not present

## 2012-07-21 DIAGNOSIS — F1020 Alcohol dependence, uncomplicated: Secondary | ICD-10-CM

## 2012-07-22 ENCOUNTER — Ambulatory Visit (INDEPENDENT_AMBULATORY_CARE_PROVIDER_SITE_OTHER): Payer: Self-pay | Admitting: Surgery

## 2012-07-23 ENCOUNTER — Other Ambulatory Visit (HOSPITAL_COMMUNITY): Payer: Medicare Other | Admitting: Psychology

## 2012-07-23 DIAGNOSIS — F1020 Alcohol dependence, uncomplicated: Secondary | ICD-10-CM

## 2012-07-23 DIAGNOSIS — F1320 Sedative, hypnotic or anxiolytic dependence, uncomplicated: Secondary | ICD-10-CM | POA: Diagnosis not present

## 2012-07-24 ENCOUNTER — Encounter (HOSPITAL_BASED_OUTPATIENT_CLINIC_OR_DEPARTMENT_OTHER): Payer: Self-pay | Admitting: *Deleted

## 2012-07-24 NOTE — Progress Notes (Signed)
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

## 2012-07-25 ENCOUNTER — Other Ambulatory Visit (HOSPITAL_COMMUNITY): Payer: Medicare Other | Admitting: Psychology

## 2012-07-25 ENCOUNTER — Encounter (HOSPITAL_COMMUNITY): Payer: Self-pay | Admitting: Psychology

## 2012-07-25 DIAGNOSIS — F1320 Sedative, hypnotic or anxiolytic dependence, uncomplicated: Secondary | ICD-10-CM | POA: Diagnosis not present

## 2012-07-25 DIAGNOSIS — F1020 Alcohol dependence, uncomplicated: Secondary | ICD-10-CM

## 2012-07-25 NOTE — Progress Notes (Signed)
    Daily Group Progress Note  Program: CD-IOP   Group Time: 1-2:30 pm  Participation Level: Minimal  Behavioral Response: Sharing  Type of Therapy: Psycho-education Group  Topic: Guest Speaker: first part of group included a guest speaker. The speaker was a former group member who had attained 18 months of sobriety. He shared his story and fielded questions from group members. It was a Hydrographic surveyor, but inspiring story and the members applauded the speaker at the conclusion of this session. They all agreed that it had been motivating and they had benefitted from his visit.   Group Time: 2:45- 4pm Participation Level: Active  Behavioral Response: Appropriate and Sharing  Type of Therapy: Process Group  Topic: Group Process: second part of group was spent in process. Two new members were present today and they were invited to share their stories. Their disclosures brought good feedback and support from their new group members. Another member had learned she had lost her job earlier today and she recounted her feelings and intentions going forward. Two group members admitted to relapsing and the events leading up to their use was reviewed and other options identified and discussed.    Summary: The patient was attentive with the guest speaker and disclosed her own battles with cancer. She made some good comments and shared about herself with the guest speaker. In process, she offered some consolation and support to her fellow group member who had lost her job. She provided encouragement to the group members who had relapsed and urged them not to give up and to learn from the relapse. She made some good comments and remains alcohol-free since 10/10.   Family Program: Family present? No   Name of family member(s):   UDS collected: No Results:   AA/NA attended?: No  Sponsor?: No   Ifeanyi Mickelson, LCAS

## 2012-07-25 NOTE — Progress Notes (Signed)
    Daily Group Progress Note  Program: CD-IOP   Group Time: 1-2:30 pm  Participation Level: Active  Behavioral Response: Sharing  Type of Therapy: Psycho-education Group  Topic:Triggers: External and Internal; What are yours? The first part of group included a check-in and a psycho educational piece. The session was spent educating about triggers, identifying "your triggers" and how to eliminate and/or address them. Members were easily able to identify both types of triggers. Physical triggers included money, [employer], drug dealers, weekends, and friends. Internal triggers included every kind of emotion and some of the newer members were not familiar with the differences. The session proved informative and members clearly gained from this discussion   Group Time: 2:45- 4pm  Participation Level: Active  Behavioral Response: Appropriate and Sharing  Type of Therapy: Process Group  Topic: Group Process/Introduction: The second part of group was spent in process. Members shared about their current issues and struggles in early recovery. Members shared about their attendance, or lack of attendance, in 12-step meetings. There was good disclosure among the group and the feedback and support was welcomed.  Summary: The patient shared that she had been busy, but is doing well and her original sobriety date remains 10/10. Cailah was able to identify triggers, including depression, isolation, and pain. She admitted she had not attended any meetings since the last group session. The patient shared little of herself in  process, but provided good feedback towards her group members. She is nearing the end of her time here in the program, but has done little to nurture or develop a support network in the community. This is clearly a concern.   Family Program: Family present? No   Name of family member(s):   UDS collected: No Results:  AA/NA attended?: No  Sponsor?: No   Hadyn Azer,  LCAS

## 2012-07-28 ENCOUNTER — Encounter (HOSPITAL_COMMUNITY): Payer: Self-pay | Admitting: Psychology

## 2012-07-28 ENCOUNTER — Other Ambulatory Visit (HOSPITAL_COMMUNITY): Payer: Medicare Other

## 2012-07-28 NOTE — Progress Notes (Signed)
    Daily Group Progress Note  Program: CD-IOP   Group Time: 1-2:30 pm  Participation Level: Active  Behavioral Response: Appropriate and Sharing  Type of Therapy: Process Group  Topic: Group Process: first half of group was spent in process. Members shared about their current struggles and issues. During this time the medical director met with 2 new group members for their initial sessions. He also spoke with 2 other group members about concerns they are having. There was one new group member and he shared about himself and his drinking. There was good disclosure and members provided excellent support and feedback to their fellow members.   Group Time: 2:45- 4pm Participation Level: Active  Behavioral Response: Sharing  Type of Therapy: Psycho-education Group  Topic: The Progressive Disease of Addiction: The second half of group was spent in a psych educational presentation on "The Progressive Disease of Addiction". A handout was provided charting the growing deterioration that occurs as one progresses in their alcohol and drug use. The group identified the developing problems that are evidenced as the disease progresses and at the conclusion of the presentation, members were asked to share where they were on the continuum prior to entering the program. Almost every member agreed that he or she had been near the end, or bottom, of the continuum. The presentation proved effective and elicited good disclosure among group members.   Summary: The patient admitted she had not attended any meetings since the last group, but reported she had read her "Daily Reflections", a book from AA, every morning. She reported she had been very busy, but was doing well. The patient reported she had experienced "Chuckie", the addictive voice in one's head. She explained that she had come across a plastic cup in her kitchen and she used to drink wine out of it when she sat around the pool in the summers. She  admitted that for a moment, a voice said "how nice it will be to sit and drink wine next summer around the pool". The patient admitted she was shocked at how quickly the voice and idea had entered her conscious thinking. She immediately discounted this thought. She reported that it has been somewhat overwhelming realizing that she is never going to have a drink again in her life. Other members reminded her of the emphasis on today and how dangerous it is to think about the future. The patient identified herself on the handout on the progressive nature of addiction as near the bottom with "undefinable fears". The patient made some good comments and displayed a growing insight into her recovery needs.    Family Program: Family present? No   Name of family member(s):   UDS collected: No Results:   AA/NA attended?: No  Sponsor?: No   Cambryn Charters, LCAS

## 2012-07-29 ENCOUNTER — Encounter (HOSPITAL_BASED_OUTPATIENT_CLINIC_OR_DEPARTMENT_OTHER): Payer: Self-pay | Admitting: *Deleted

## 2012-07-29 ENCOUNTER — Encounter (HOSPITAL_BASED_OUTPATIENT_CLINIC_OR_DEPARTMENT_OTHER)
Admission: RE | Disposition: A | Discharge status: Home / Self Care | Payer: Self-pay | Source: Ambulatory Visit | Attending: Surgery

## 2012-07-29 ENCOUNTER — Ambulatory Visit (HOSPITAL_BASED_OUTPATIENT_CLINIC_OR_DEPARTMENT_OTHER): Payer: Medicare Other | Admitting: *Deleted

## 2012-07-29 ENCOUNTER — Ambulatory Visit (HOSPITAL_BASED_OUTPATIENT_CLINIC_OR_DEPARTMENT_OTHER)
Admission: RE | Admit: 2012-07-29 | Discharge: 2012-07-29 | Disposition: A | Discharge status: Home / Self Care | Payer: Medicare Other | Source: Ambulatory Visit | Attending: Surgery | Admitting: Surgery

## 2012-07-29 DIAGNOSIS — Z9010 Acquired absence of unspecified breast and nipple: Secondary | ICD-10-CM | POA: Insufficient documentation

## 2012-07-29 DIAGNOSIS — I2510 Atherosclerotic heart disease of native coronary artery without angina pectoris: Secondary | ICD-10-CM | POA: Insufficient documentation

## 2012-07-29 DIAGNOSIS — Z452 Encounter for adjustment and management of vascular access device: Secondary | ICD-10-CM

## 2012-07-29 DIAGNOSIS — Z9861 Coronary angioplasty status: Secondary | ICD-10-CM | POA: Insufficient documentation

## 2012-07-29 DIAGNOSIS — I10 Essential (primary) hypertension: Secondary | ICD-10-CM | POA: Insufficient documentation

## 2012-07-29 DIAGNOSIS — Z853 Personal history of malignant neoplasm of breast: Secondary | ICD-10-CM

## 2012-07-29 DIAGNOSIS — Z888 Allergy status to other drugs, medicaments and biological substances status: Secondary | ICD-10-CM | POA: Insufficient documentation

## 2012-07-29 DIAGNOSIS — E785 Hyperlipidemia, unspecified: Secondary | ICD-10-CM | POA: Insufficient documentation

## 2012-07-29 HISTORY — PX: PORT-A-CATH REMOVAL: SHX5289

## 2012-07-29 LAB — POCT I-STAT, CHEM 8
BUN: 14 mg/dL (ref 6–23)
Hemoglobin: 15.6 g/dL — ABNORMAL HIGH (ref 12.0–15.0)
Sodium: 141 mEq/L (ref 135–145)
TCO2: 27 mmol/L (ref 0–100)

## 2012-07-29 SURGERY — REMOVAL PORT-A-CATH
Anesthesia: Monitor Anesthesia Care | Site: Chest | Laterality: Left | Wound class: Clean

## 2012-07-29 MED ORDER — LIDOCAINE HCL 1 % IJ SOLN
INTRAMUSCULAR | Status: DC | PRN
  Administered 2012-07-29: 11:00:00

## 2012-07-29 MED ORDER — CHLORHEXIDINE GLUCONATE 4 % EX LIQD: 1.0000 "application " | Freq: Once | CUTANEOUS | Status: DC

## 2012-07-29 MED ORDER — LIDOCAINE HCL (CARDIAC) 20 MG/ML IV SOLN
INTRAVENOUS | Status: DC | PRN
  Administered 2012-07-29: 20 mg via INTRAVENOUS

## 2012-07-29 MED ORDER — LACTATED RINGERS IV SOLN
INTRAVENOUS | Status: DC
  Administered 2012-07-29: 10:00:00 via INTRAVENOUS

## 2012-07-29 MED ORDER — MIDAZOLAM HCL 5 MG/5ML IJ SOLN
INTRAMUSCULAR | Status: DC | PRN
  Administered 2012-07-29: 2 mg via INTRAVENOUS

## 2012-07-29 MED ORDER — HYDROMORPHONE HCL PF 1 MG/ML IJ SOLN: 0.2500 mg | INTRAMUSCULAR | Status: DC | PRN

## 2012-07-29 MED ORDER — PROPOFOL 10 MG/ML IV EMUL
INTRAVENOUS | Status: DC | PRN
  Administered 2012-07-29: 200 ug/kg/min via INTRAVENOUS

## 2012-07-29 MED ORDER — ONDANSETRON HCL 4 MG/2ML IJ SOLN
INTRAMUSCULAR | Status: DC | PRN
  Administered 2012-07-29: 4 mg via INTRAVENOUS

## 2012-07-29 MED ORDER — FENTANYL CITRATE 0.05 MG/ML IJ SOLN
INTRAMUSCULAR | Status: DC | PRN
  Administered 2012-07-29 (×2): 50 ug via INTRAVENOUS

## 2012-07-29 SURGICAL SUPPLY — 32 items
ADH SKN CLS APL DERMABOND .7 (GAUZE/BANDAGES/DRESSINGS) ×1
BLADE SURG 15 STRL LF DISP TIS (BLADE) ×1 IMPLANT
BLADE SURG 15 STRL SS (BLADE) ×2
CHLORAPREP W/TINT 26ML (MISCELLANEOUS) ×2 IMPLANT
CLOTH BEACON ORANGE TIMEOUT ST (SAFETY) ×2 IMPLANT
COVER MAYO STAND STRL (DRAPES) ×2 IMPLANT
COVER TABLE BACK 60X90 (DRAPES) ×2 IMPLANT
DECANTER SPIKE VIAL GLASS SM (MISCELLANEOUS) ×1 IMPLANT
DERMABOND ADVANCED (GAUZE/BANDAGES/DRESSINGS) ×1
DERMABOND ADVANCED .7 DNX12 (GAUZE/BANDAGES/DRESSINGS) ×1 IMPLANT
DRAPE PED LAPAROTOMY (DRAPES) ×2 IMPLANT
DRAPE UTILITY XL STRL (DRAPES) ×2 IMPLANT
ELECT REM PT RETURN 9FT ADLT (ELECTROSURGICAL) ×2
ELECTRODE REM PT RTRN 9FT ADLT (ELECTROSURGICAL) ×1 IMPLANT
GAUZE SPONGE 4X4 12PLY STRL LF (GAUZE/BANDAGES/DRESSINGS) ×1 IMPLANT
GLOVE BIOGEL PI IND STRL 7.5 (GLOVE) IMPLANT
GLOVE BIOGEL PI INDICATOR 7.5 (GLOVE) ×1
GLOVE ECLIPSE 7.0 STRL STRAW (GLOVE) ×1 IMPLANT
GLOVE EUDERMIC 7 POWDERFREE (GLOVE) ×2 IMPLANT
GOWN BRE IMP PREV XXLGXLNG (GOWN DISPOSABLE) ×1 IMPLANT
GOWN PREVENTION PLUS XLARGE (GOWN DISPOSABLE) ×3 IMPLANT
NDL HYPO 25X1 1.5 SAFETY (NEEDLE) ×1 IMPLANT
NEEDLE HYPO 25X1 1.5 SAFETY (NEEDLE) ×2 IMPLANT
PACK BASIN DAY SURGERY FS (CUSTOM PROCEDURE TRAY) ×2 IMPLANT
PENCIL BUTTON HOLSTER BLD 10FT (ELECTRODE) ×2 IMPLANT
SLEEVE SCD COMPRESS KNEE MED (MISCELLANEOUS) IMPLANT
SUT MNCRL AB 4-0 PS2 18 (SUTURE) ×2 IMPLANT
SUT VICRYL 3-0 CR8 SH (SUTURE) ×2 IMPLANT
SYR CONTROL 10ML LL (SYRINGE) ×2 IMPLANT
TOWEL OR 17X24 6PK STRL BLUE (TOWEL DISPOSABLE) ×3 IMPLANT
TOWEL OR NON WOVEN STRL DISP B (DISPOSABLE) ×2 IMPLANT
WATER STERILE IRR 1000ML POUR (IV SOLUTION) ×1 IMPLANT

## 2012-07-29 NOTE — Interval H&P Note (Signed)
History and Physical Interval Note:  07/29/2012 10:12 AM  Suzanne [name]  has presented today for surgery, with the diagnosis of un-needed porta cath  The various methods of treatment have been discussed with the patient and family. After consideration of risks, benefits and other options for treatment, the patient has consented to  Procedure(s) (LRB) with comments: REMOVAL PORT-A-CATH (N/A) as a surgical intervention .  The patient's history has been reviewed, patient examined, no change in status, stable for surgery.  I have reviewed the patient's chart and labs.  Questions were answered to the patient's satisfaction.     Naleyah Ohlinger J

## 2012-07-29 NOTE — Anesthesia Postprocedure Evaluation (Signed)
  Anesthesia Post-op Note  Patient: Suzanne [name]  Procedure(s) Performed: Procedure(s) (LRB) with comments: REMOVAL PORT-A-CATH (Left)  Patient Location: PACU  Anesthesia Type:MAC  Level of Consciousness: awake  Airway and Oxygen Therapy: Patient Spontanous Breathing  Post-op Pain: mild  Post-op Assessment: Post-op Vital signs reviewed  Post-op Vital Signs: Reviewed  Complications: No apparent anesthesia complications

## 2012-07-29 NOTE — Anesthesia Procedure Notes (Signed)
Procedure Name: MAC Date/Time: 07/29/2012 10:52 AM Performed by: Meyer Russel Pre-anesthesia Checklist: Patient identified, Emergency Drugs available, Suction available and Patient being monitored Patient Re-evaluated:Patient Re-evaluated prior to inductionOxygen Delivery Method: Simple face mask Preoxygenation: Pre-oxygenation with 100% oxygen

## 2012-07-29 NOTE — Op Note (Signed)
Suzanne [name] 1955-11-10 [id number] 07/15/2012  Preoperative diagnosis: Un-Needed PAC  Postoperative diagnosis: Same  Procedure: Portacath Removal  Surgeon: Currie Paris, MD, FACS  Anesthesia:MAC   Clinical History and Indications: The patient has finished her chemotherapy and no longer needs a port. She wishes to have it removed.  Procedure: The patient was seen in the preoperative area and we confirmed the plans for the procedure as noted above. The Port-A-Cath site was identified and marked. The patient had no further questions.  The patient was then taken into the procedure room. The timeout was done. The area over the Port-A-Cath was anesthetized with 1% Xylocaine with epinephrine. I waited about 10 minutes and then the area was prepped and draped.  The old scar was opened. The capsule around the port opened and the port identified. Their were no holding sutures.. The catheter was backed partially out of its tract. A figure 8 3-0 Vicryl suture was placed, the tubing removed, and the suture tied down to prevent backbleeding.  The port was then removed from its pocket. I made sure everything was dry. The incision was closed with 3-0 Vicryl, 4-0 Monocryl subcuticular, and Dermabond.  The patient tolerated the procedure well. There were no complications.  Currie Paris, MD, FACS 07/29/2012 11:13 AM

## 2012-07-29 NOTE — H&P (View-Only) (Signed)
NAME: Suzanne [name] DOB: 04/07/1956 MRN: [id number]                                                                                      DATE: 07/15/2012  PCP: RUBIN,PETER, MD Referring Provider: Rubin, Peter, MD  IMPRESSION:  Un-needed portacath Recurrent breast cancer, currently in remission  PLAN:   Port removal under MAC anesthesia                 CC:  Chief Complaint  Patient presents with  . Advice Only    discuss PAC removal    HPI:  Suzanne [name] is a 56 y.o.  female who presents for evaluation of port removal. She has had no chemo for a while and last scans negative. She knows she may need a port in the future but would like this one out as it has nbot been used in a long time  PMH:  has a past medical history of Dyspnea; Cancer; Lung disorder; History of coronary artery stent placement; Hyperlipidemia; and Hypertension.  PSH:   has past surgical history that includes R transflap ('94); R masectomy ('94 ); Mastectomy; ORIF distal radius fracture; and Cardiac catheterization.  ALLERGIES:   Allergies  Allergen Reactions  . Prednisone Other (See Comments)    Redness in the face and violent feelings    MEDICATIONS: Current outpatient prescriptions:carvedilol (COREG) 6.25 MG tablet, Take 1 tablet (6.25 mg total) by mouth 2 (two) times daily with a meal. For heart condition, Disp: 180 tablet, Rfl: 3;  letrozole (FEMARA) 2.5 MG tablet, Take 2.5 mg by mouth daily. , Disp: , Rfl: ;  valsartan (DIOVAN) 40 MG tablet, Take 1 tablet (40 mg total) by mouth 2 (two) times daily., Disp: 180 tablet, Rfl: 3  ROS: She has filled out our 12 point review of systems and it is negative . EXAM:   VITAL SIGNS: BP 124/68  Pulse 68  Temp 97.6 F (36.4 C) (Temporal)  Resp 16  Ht 5' 5" (1.651 m)  Wt 188 lb (85.276 kg)  BMI 31.28 kg/m2 GENERAL:  The patient is alert, oriented, and generally healthy-appearing, NAD. Mood and affect are normal.  HEENT:  The head is normocephalic, the eyes  nonicteric, the pupils were round regular and equal. EOMs are normal. Pharynx normal. Dentition good.  NECK:  The neck is supple and there are no masses or thyromegaly.  LUNGS: Normal respirations and clear to auscultation.  HEART: Regular rhythm, with no murmurs rubs or gallops. Pulses are intact carotid dorsalis pedis and posterior tibial. No significant varicosities are noted.  BREASTS: S/P right mastectomy, tram and radiation with some resulting deformity.  ABDOMEN: Soft, flat, and nontender. No masses or organomegaly is noted. No hernias are noted. Bowel sounds are normal.  EXTREMITIES:  Good range of motion, no edema.   DATA REVIEWED:  EPIC notes and my old paper office notes    Arbor Leer J 07/15/2012  CC: Rubin, Peter, MD, RUBIN,PETER, MD        

## 2012-07-29 NOTE — Anesthesia Preprocedure Evaluation (Addendum)
Anesthesia Evaluation  Patient identified by MRN, date of birth, ID band Patient awake    Reviewed: Allergy & Precautions, H&P , NPO status   Airway Mallampati: II      Dental   Pulmonary shortness of breath and with exertion,  breath sounds clear to auscultation        Cardiovascular hypertension, Rhythm:Regular Rate:Normal     Neuro/Psych    GI/Hepatic negative GI ROS, Neg liver ROS,   Endo/Other  negative endocrine ROS  Renal/GU negative Renal ROS     Musculoskeletal   Abdominal   Peds  Hematology   Anesthesia Other Findings   Reproductive/Obstetrics                          Anesthesia Physical Anesthesia Plan  ASA: III  Anesthesia Plan: MAC   Post-op Pain Management:    Induction: Intravenous  Airway Management Planned:   Additional Equipment:   Intra-op Plan:   Post-operative Plan:   Informed Consent: I have reviewed the patients History and Physical, chart, labs and discussed the procedure including the risks, benefits and alternatives for the proposed anesthesia with the patient or authorized representative who has indicated his/her understanding and acceptance.   Dental advisory given  Plan Discussed with: CRNA, Anesthesiologist and Surgeon  Anesthesia Plan Comments:         Anesthesia Quick Evaluation

## 2012-07-29 NOTE — Transfer of Care (Signed)
Immediate Anesthesia Transfer of Care Note  Patient: Suzanne [name]  Procedure(s) Performed: Procedure(s) (LRB) with comments: REMOVAL PORT-A-CATH (Left)  Patient Location: PACU  Anesthesia Type:MAC  Level of Consciousness: awake, alert , oriented and patient cooperative  Airway & Oxygen Therapy: Patient Spontanous Breathing and Patient connected to face mask oxygen  Post-op Assessment: Report given to PACU RN and Post -op Vital signs reviewed and stable  Post vital signs: Reviewed and stable  Complications: No apparent anesthesia complications

## 2012-07-30 ENCOUNTER — Other Ambulatory Visit (HOSPITAL_COMMUNITY): Payer: Medicare Other

## 2012-07-30 ENCOUNTER — Encounter (HOSPITAL_BASED_OUTPATIENT_CLINIC_OR_DEPARTMENT_OTHER): Payer: Self-pay | Admitting: Surgery

## 2012-08-01 ENCOUNTER — Other Ambulatory Visit (HOSPITAL_COMMUNITY): Payer: Medicare Other | Admitting: Psychology

## 2012-08-01 DIAGNOSIS — F1320 Sedative, hypnotic or anxiolytic dependence, uncomplicated: Secondary | ICD-10-CM | POA: Diagnosis not present

## 2012-08-01 DIAGNOSIS — F1020 Alcohol dependence, uncomplicated: Secondary | ICD-10-CM

## 2012-08-01 NOTE — Progress Notes (Unsigned)
Patient ID: Suzanne [name], female   DOB: 11/18/1955, 56 y.o.   MRN: [id number] Treatment Plan Update: I met with the patient and her daughter this morning prior to her group session today. She had come with her daughter hoping that our session would provide some insight and understanding about what she is struggling with. The patient has admitted in previous group sessions that she is hesitant and has not used the term "alcoholic" with her family. Today I explained to Suzanne [name] about her mother's chronic illness. I reviewed the things her mother is learning in group and what we hope that family members will provide in terms of support and validation. Despite the patient's concerns that her daughter was very shy and introverted, Suzanne [name] spoke freely and openly about her mother. She confirmed that their relationship has been greatly improved since she stopped drinking. Suzanne [name] indicated she understood the chronic nature of her mother's disease and did not appear phased by the term "alcoholic". The patient is doing well relative to sobriety and her sobriety date remains 10/10. She is not attended meetings as we would have liked, but enjoys the few she does go to on the weekends at the [employer]. The session was open with both mother and daughter talking freely about what has occurred within the family and what will help Suzanne [name] in her recovery going forward. We will continue to follow closely in the days ahead.

## 2012-08-02 ENCOUNTER — Encounter (HOSPITAL_COMMUNITY): Payer: Self-pay | Admitting: Psychology

## 2012-08-02 NOTE — Progress Notes (Signed)
    Daily Group Progress Note  Program: CD-IOP   Group Time: 1-2:30 pm  Participation Level: Minimal  Behavioral Response: Appropriate and Sharing  Type of Therapy: Psycho-education Group  Topic: Guest Speaker: the first part of group was spent in a brief check-in and the arrival of a guest speaker. The guest speaker had been a former group member who had recently picked up his 6 month chip. He briefly recounted his long history of addiction, but focused more on what has occurred in the 6 months since he got sober. This former patient shared about his devotion to NA, the fellowship, securing a sponsor and working the steps. He emphasized the importance of "only for today" and provided encouragement to the group members. He fielded a few questions at the conclusion of his talk and the group thanked him for sharing.   Group Time: 2:45- 4pm  Participation Level: Active  Behavioral Response: Appropriate  Type of Therapy: Process Group  Topic: Group Process/Introductions: the second half of group was spent in process. One member had been spoken to as the first session ended and he had admitted he was high. He was asked to stay in the lobby until the group was over and he was more capable of driving. I explained to the group that in the future, if anyone observes a fellow group member that appears high, they should immediately share their observations. It is a real trigger to observe other people high and it is unacceptable to be in group high with others in recovery. Two new group members were present and they were asked to briefly introduce themselves. There was good feedback and the new members appeared to be in the right place. Another member shared that he may not return to group on Monday. He questioned whether he needs to be here and he received good feedback and questions from members. The session proved insightful and included some good exchanges.    Summary: The patient reported she  is doing well and remains sober. Her sobriety date is 10/10. She was attentive during the speaker's visit and reported he ws very inspiring. In process, the patient reported she had missed group on Wednesday because she was still suffering the side effects from the anesthesia after her surgical procedure. She reported she had met with her husband and myself prior to the group and it had gone well. She felt like he had learned a lot from the session and would be more supportive. The patient made some good comments and continues to make progress in her recovery.   Family Program: Family present? No   Name of family member(s):   UDS collected: No Results:  AA/NA attended?: NoSaturday and Sunday  Sponsor?: No, but she reported she had met a woman and was going to ask her to sponsor her at the meeting tomorrow at the [employer].   Benjamin Merrihew, LCAS

## 2012-08-04 ENCOUNTER — Other Ambulatory Visit (HOSPITAL_COMMUNITY): Payer: Medicare Other | Admitting: Psychology

## 2012-08-04 DIAGNOSIS — F1320 Sedative, hypnotic or anxiolytic dependence, uncomplicated: Secondary | ICD-10-CM | POA: Diagnosis not present

## 2012-08-04 DIAGNOSIS — F1020 Alcohol dependence, uncomplicated: Secondary | ICD-10-CM

## 2012-08-07 ENCOUNTER — Encounter (HOSPITAL_COMMUNITY): Payer: Self-pay | Admitting: Psychology

## 2012-08-07 ENCOUNTER — Other Ambulatory Visit (HOSPITAL_COMMUNITY): Payer: Medicare Other

## 2012-08-07 NOTE — Progress Notes (Signed)
    Daily Group Progress Note  Program: CDIOP   Group Time: 1-2:30 pm  Participation Level: Active  Behavioral Response: Appropriate and Sharing  Type of Therapy: Process Group  Topic: Group Process: First part of group was spent in process. Members shared about current issues and concerns. One member shared about her brother and their relationship. Another member disclosed having a blow-up with his mother. Another member shared that her husband seems more supportive of her recovery after having met with him and me for a session. The 2 female members present both noted that their "good to" emotion is anger. The group discussed what emotion might be underlying the anger. Members were encouraged not to re-enact their adolescence. There was good disclosure and feedback among the group.   Group Time: 2:45- 4pm  Participation Level: Active  Behavioral Response: Sharing  Type of Therapy: Psycho-education Group  Topic: Remaining clean and sober over the Holiday: Second half of group was spent in a discussion on the importance of remaining vigilant towards one's recovery during the upcoming hoiday week. Members shared their plans for Christmas and identified the ways in which they would remain alcohol and drug-free. They included plans in case something changed or became too challenging or tempting. Each member seemed very confident that he or she would have lots of support and remain abstinent.   Summary: The patient reported she is doing well and had a good weekend. She reported she went to the meeting at the [facility] on Sunday and the group was really good. She reported that she and her husband went to the [employer] on Friday. They met a couple for dinner before and her husband was very supportive of her efforts towards sobriety. "There were 4 seats at the bar and the other couple wanted to sit there, but my husband suggested that it wasn't good and they should wait for a table". She reported her  son was coming in and they would be at her in-laws home for Christmas dinner, where they do drink. The patient appeared unconcerned about any temptations she might have and reported she felt as if she needed support she could easily reach for her family. She continues to make good progress in her recovery and her sobriety date has remained 10/10 throughout her treatment here.    Family Program: Family present? No   Name of family member(s):   UDS collected: No Results:  AA/NA attended?: [place]  Sponsor?: No, but she has someone in mind   Arrie Zuercher, LCAS

## 2012-08-08 ENCOUNTER — Other Ambulatory Visit (HOSPITAL_COMMUNITY): Payer: Medicare Other | Admitting: Psychology

## 2012-08-08 ENCOUNTER — Encounter (HOSPITAL_COMMUNITY): Payer: Self-pay | Admitting: Psychology

## 2012-08-08 DIAGNOSIS — F1320 Sedative, hypnotic or anxiolytic dependence, uncomplicated: Secondary | ICD-10-CM | POA: Diagnosis not present

## 2012-08-08 DIAGNOSIS — F1020 Alcohol dependence, uncomplicated: Secondary | ICD-10-CM

## 2012-08-08 DIAGNOSIS — F1920 Other psychoactive substance dependence, uncomplicated: Secondary | ICD-10-CM

## 2012-08-09 LAB — PRESCRIPTION ABUSE MONITORING 17P, URINE
Amphetamine/Meth: NEGATIVE ng/mL
Buprenorphine, Urine: NEGATIVE ng/mL
Carisoprodol, Urine: NEGATIVE ng/mL
Creatinine, Urine: 48.45 mg/dL (ref >20.0)
Opiate Screen, Urine: NEGATIVE ng/mL
Oxycodone Screen, Ur: NEGATIVE ng/mL

## 2012-08-09 LAB — ALCOHOL METABOLITE (ETG), URINE: Ethyl Glucuronide (EtG): NEGATIVE ng/mL

## 2012-08-10 ENCOUNTER — Encounter (HOSPITAL_COMMUNITY): Payer: Self-pay | Admitting: Psychology

## 2012-08-10 NOTE — Progress Notes (Signed)
    Daily Group Progress Note  Program: CD-IOP   Group Time: 1-2:30 pm  Participation Level: Active  Behavioral Response: Sharing  Type of Therapy: Psycho-education Group  Topic: Why You can't Do It Yourself: first part of group was spent in psychoeduation. A presentation was provided on the importance of engaging with others and sharing your feelings, thoughts and experiences with other people, preferably in recovery. The idea was offered to the group about the following: "You have a disease, it lives in your head, and it speaks to you through your thoughts". if members accept this to be true, and almost everyone agreed that their addiction "talks" to them, then it is very difficulty to identify if it is your addiction, or your sober self, that is generating your thoughts. The session was intense and members shared openly about their own experiences. Also present was the Wellsite geologist, who shared his beliefs about this topic and the importance of carrying on dialogue with others in recovery.  Group Time: 2:45- 4pm  Participation Level: Active  Behavioral Response: Appropriate and Sharing  Type of Therapy: Process Group  Topic: Group Process/Introduction: the second half of group was spent in process. Members shared about their current issues and struggles in recovery. One member had drank since the last group and he recounted the events leading up to his use. The group processed possible interventions that could have been taken to avoid the relapse. Another group member was new to the group and he shared about his past drug use and the events that had brought him here. There was good disclosure and feedback. Prior to the group ending, each member shared what he/she intended to do to remain alcohol and drug-free over this holiday weekend.   Summary: The patient reported she is doing well. She agreed about the inner voice, which we have identified as "Chuckie" and reported she plans to  attend more meetings when she completed treatment here. The patient shared that her 63 yo son is visiting and she is concerned about his level of drinking. She insisted she would be having a talk with him before he leaves, which will have to be soon because he will be leaving on Sunday. The patient offered good feedback to her fellow group members and made some good comments. Her sobriety date remains 10/10.   Family Program: Family present? No   Name of family member(s):   UDS collected: Yes Results: negative  AA/NA attended?: No  Sponsor?: No   Doriann Zuch, LCAS

## 2012-08-11 ENCOUNTER — Other Ambulatory Visit (HOSPITAL_COMMUNITY): Payer: Medicare Other | Admitting: Psychology

## 2012-08-11 DIAGNOSIS — F1320 Sedative, hypnotic or anxiolytic dependence, uncomplicated: Secondary | ICD-10-CM | POA: Diagnosis not present

## 2012-08-11 DIAGNOSIS — F1020 Alcohol dependence, uncomplicated: Secondary | ICD-10-CM

## 2012-08-11 NOTE — Progress Notes (Unsigned)
Patient ID: Suzanne [name], female   DOB: 01-03-1956, 56 y.o.   MRN: [id number] Discharge from the CD-IOP: I met with the patient an hour before group today. We had agreed to meet at this patient's time is nearing an end and she will be graduating successfully. We agreed that she would remain through the New Year and complete her treatment on Friday, January 3rd. She reported the program had been very helpful and she had learned a lot about recovery and what she needs to do. The patient reported she feels good about herself and will continue to work a daily recovery plan. Despite her resistance to attending 12-step meetings, which is the second goal of treatment, she insisted she will be going to them on a weekly basis once she is out of this program. The documentation was reviewed and the discharge plan completed accordingly. The patient has remained sober since entering the program and her sobriety date has remained 10/10.Because of her failure to build support for her recovery in the community and [place], the prognosis for treatment is guarded. We instruct patients to begin building support and attending meetings while they are in treatment. Her failure to do this is a very big concern for the medical team and we are definitely wary of her ability to remain alcohol-free without support. Please see Discharge Summary and Discharge Plan in Epic. The patient is scheduled to complete the program on Friday, August 15, 2012.

## 2012-08-12 ENCOUNTER — Other Ambulatory Visit (HOSPITAL_COMMUNITY): Payer: Medicare Other

## 2012-08-14 ENCOUNTER — Encounter (HOSPITAL_COMMUNITY): Payer: Self-pay | Admitting: Psychology

## 2012-08-14 NOTE — Progress Notes (Signed)
    Daily Group Progress Note  Program: CD-IOP   Group Time: 1-2:30 pm  Participation Level: Active  Behavioral Response: Appropriate and Sharing  Type of Therapy: Process Group  Topic: Group Process: first part of group was spent in process. Members shared about current struggles and issues in early recovery. Members discussed the past weekend. While some had attended 12-step meetings, other members had not gone to any. I questioned their lack of commitment and wondered why they couldn't spend 1 hour per day addressing their recovery? I pointed out they had spent many hours per day getting high.   Group Time: 2:45- 4pm  Participation Level: Active  Behavioral Response: Sharing  Type of Therapy: Psycho-education Group  Topic: Identifying things you Value and Inner Strengths: second half of group was spent in a psych education session. A handout was provided asking members to identify "Things I Value" now in recovery along with having them "Inner Strengths". There was some confusion about values now versus while in their active addiction, because clearly they did not care about most things other than their drugs, alcohol and getting high. Many members had a difficult time identifying inner strengths and asked me to explain that one. There was a good discussion with members identifying life, health, family, integrity, and friends as just some of their values. The session was lively with good disclosure and members opened up and shared more about their inner selves than in the past. At the conclusion of the session, members shared their plans for the upcoming New Year's Celebration and how the planned on spending their evening.   Summary: The patient reported she is doing well and had spoken to her son since the last session. She explained that he is preparing to leave tomorrow and return to [place], [place]., where he lives. In the last session, she had expressed concern about his drinking  and stated she would talk to him about her problem and emphasize the potential problem he could develop if he was not attentive to his alcohol use. Today in group she reported she had spoken to him and laid it out very clearly. He admitted he had observed changes for the better with his mother. The patient reported she had come to group today despite not being there when he drove down the driveway, but she admitted it was painful to watch him drive away. She reported she and her hu8sband planned a very quiet New Years' celebration and probably would go to bed before midnight. The patient identified health and family as her most important values. She believes that strength and determination are her most important internal strengths.   Family Program: Family present? No   Name of family member(s):   UDS collected: No Results:   AA/NA attended?: No  Sponsor?: No   Dylann Layne, LCAS

## 2012-08-15 ENCOUNTER — Encounter (INDEPENDENT_AMBULATORY_CARE_PROVIDER_SITE_OTHER): Payer: Medicare Other | Admitting: Surgery

## 2012-08-15 ENCOUNTER — Other Ambulatory Visit (HOSPITAL_COMMUNITY): Payer: Medicare Other | Attending: Psychiatry | Admitting: Psychology

## 2012-08-15 DIAGNOSIS — F1020 Alcohol dependence, uncomplicated: Secondary | ICD-10-CM

## 2012-08-19 ENCOUNTER — Encounter (INDEPENDENT_AMBULATORY_CARE_PROVIDER_SITE_OTHER): Payer: Self-pay | Admitting: Surgery

## 2012-08-19 ENCOUNTER — Encounter (HOSPITAL_COMMUNITY): Payer: Self-pay | Admitting: Psychology

## 2012-08-19 ENCOUNTER — Ambulatory Visit (INDEPENDENT_AMBULATORY_CARE_PROVIDER_SITE_OTHER): Payer: Medicare Other | Admitting: Surgery

## 2012-08-19 VITALS — BP 100/68 | HR 70 | Resp 16 | Ht 65.0 in | Wt 181.0 lb

## 2012-08-19 DIAGNOSIS — Z09 Encounter for follow-up examination after completed treatment for conditions other than malignant neoplasm: Secondary | ICD-10-CM

## 2012-08-19 NOTE — Patient Instructions (Signed)
We will see you again on an as needed basis. Please call the office at [phone] if you have any questions or concerns. Thank you for allowing us to take care of you.  

## 2012-08-19 NOTE — Progress Notes (Unsigned)
    Daily Group Progress Note  Program: CD-IOP   Group Time: 1-2:30 pm  Participation Level: Active  Behavioral Response: Appropriate and Sharing  Type of Therapy: Psycho-education Group  Topic: The Relapse Process: Setting Yourself Up for Relapse. The first part of this group session was a psych educational piece on relapse and ways that people set themselves up to use. One member had checked-in with a new sobriety date and described his relapse on New Year's Eve. The group identified the many red flags that had been displayed prior to his actual use. Had this patient been paying better attention and sharing about this thoughts and feelings, he may have easily avoided the relapse. Instead, there was a failure to observe regular daily aspects of his recovery prior to using. This generated an excellent discussion about knowing one's vulnerabilities and how one must plan and protect one's self against them. There was good disclosure and sharing and the relapsing patient received good support and feedback.   Group Time: 2:45- 4pm  Participation Level: Active  Behavioral Response: Sharing  Type of Therapy: Process Group  Topic: Group Process/Introduction/Graduation: Members shared about current issues and concerns. There was good discussion about typical problems experienced in early recovery. A new group member was present for her first day. She shared about her drinking and her struggles staying sober. At the end of the session, two graduating members were honored as they completed the program. There were kind words shared and the graduating members shared about the benefits of the group and how grateful they were to have been here.   Summary: The patient reported she had had a good New Year's. She admitted it had been a little sad because her son had left and returned to [place] while her daughter and husband went back to work. She was attentive, but quiet as the group discussed the  relapse process and how a change in one's patterns can be a red flag. She provided good feedback to the member who relapsed. In process, the patient reported she was glad that she had come to this program and recounted how her roommate in detox had encouraged her to continue in this program. Today, the patient reported she would certainly have been back to drinking were it not for the knowledge and insight she had gained here. She was one of the group members graduating today and there were kind words and expressions of hope and encouragement that were given her by her fellow group members. The patient assured the group she would attend meetings and remain engaged in her recovery. She has done very well in the program and remained alcohol and drug free with a sobriety date of 10/10. She leaves the program having completed successfully.    Family Program: Family present? No   Name of family member(s):   UDS collected: No Results:   AA/NA attended?: No, she has attended a few meetings while enrolled in the program, but her failure to engage actively in the recovery community is a major concern for the treatment team.  Sponsor?: No   Keundra Petrucelli, LCAS

## 2012-08-19 NOTE — Progress Notes (Signed)
NAME: Suzanne [name]                                            DOB: [birth year] DATE: 08/19/2012                                                  MRN: [id number]  CC: Post op   HPI: This patient comes in for post op follow-up .Sheunderwent portactah removal on 07/29/12. She feels that she is doing well.  PE:  VITAL SIGNS: BP 100/68  Pulse 70  Resp 16  Ht 5\' 5"  (1.651 m)  Wt 181 lb (82.101 kg)  BMI 30.12 kg/m2  General: The patient appears to be healthy, NAD Incision clean and healing well  DATA REVIEWED: None  IMPRESSION: The patient is doing well S/P port removal.    PLAN: RTC PRN

## 2012-08-28 ENCOUNTER — Telehealth: Payer: Self-pay | Admitting: *Deleted

## 2012-08-28 NOTE — Telephone Encounter (Signed)
Pt wanted [place] to know her BP had been running low (SBP=100s). 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 Kirke Corin for his review Understanding verb

## 2012-08-28 NOTE — Telephone Encounter (Signed)
Ok. Good plan

## 2012-08-28 NOTE — Telephone Encounter (Signed)
Pt has stop taking the diovan. Stop taking on Sunday and BP is still running low. Wants to know if she should stop the carvedilol.

## 2012-08-29 NOTE — Telephone Encounter (Signed)
Pt informed Understanding verb 

## 2012-08-29 NOTE — Telephone Encounter (Signed)
LMTCB

## 2012-09-27 ENCOUNTER — Other Ambulatory Visit: Payer: Self-pay

## 2012-11-26 ENCOUNTER — Telehealth: Payer: Self-pay | Admitting: *Deleted

## 2012-11-26 ENCOUNTER — Encounter: Payer: Self-pay | Admitting: Oncology

## 2012-11-26 NOTE — Telephone Encounter (Signed)
Pt returned my call and I confirmed 12/12/12 appt w/ pt.  Mailed letter & calendar to pt. 

## 2012-11-26 NOTE — Telephone Encounter (Signed)
Left message for pt to return my call so I can get her scheduled w/ Dr. Welton Flakes since Dr. Donnie Coffin is no longer here.

## 2012-12-12 ENCOUNTER — Ambulatory Visit (HOSPITAL_BASED_OUTPATIENT_CLINIC_OR_DEPARTMENT_OTHER): Payer: Medicare Other | Admitting: Family

## 2012-12-12 ENCOUNTER — Encounter: Payer: Self-pay | Admitting: Family

## 2012-12-12 VITALS — BP 90/54 | HR 54 | Temp 98.2°F | Resp 20 | Ht 65.0 in | Wt 161.4 lb

## 2012-12-12 DIAGNOSIS — C773 Secondary and unspecified malignant neoplasm of axilla and upper limb lymph nodes: Secondary | ICD-10-CM

## 2012-12-12 DIAGNOSIS — G8929 Other chronic pain: Secondary | ICD-10-CM

## 2012-12-12 DIAGNOSIS — C44599 Other specified malignant neoplasm of skin of other part of trunk: Secondary | ICD-10-CM

## 2012-12-12 DIAGNOSIS — Z853 Personal history of malignant neoplasm of breast: Secondary | ICD-10-CM

## 2012-12-12 DIAGNOSIS — R911 Solitary pulmonary nodule: Secondary | ICD-10-CM

## 2012-12-12 MED ORDER — LETROZOLE 2.5 MG PO TABS: 2.5000 mg | ORAL_TABLET | Freq: Every day | ORAL | Status: DC

## 2012-12-12 NOTE — Patient Instructions (Addendum)
Please contact us at [phone] if you have any questions or concerns.  Caltrate 600 mg daily and Vitamin D3 1000 IU daily
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
## 2012-12-15 ENCOUNTER — Telehealth: Payer: Self-pay | Admitting: *Deleted

## 2012-12-15 NOTE — Telephone Encounter (Signed)
Lm informed the pt that she is schedule for a lab on 5/9 @3 :45pm. gv appts d/t for 07/13/13. Made her aware that cs will call to schedule her ct. Also made pt aware that i will mail her a cal...td

## 2012-12-19 ENCOUNTER — Telehealth: Payer: Self-pay | Admitting: Family

## 2012-12-19 ENCOUNTER — Other Ambulatory Visit (HOSPITAL_BASED_OUTPATIENT_CLINIC_OR_DEPARTMENT_OTHER): Payer: Medicare Other | Admitting: Lab

## 2012-12-19 DIAGNOSIS — C50919 Malignant neoplasm of unspecified site of unspecified female breast: Secondary | ICD-10-CM

## 2012-12-19 DIAGNOSIS — E559 Vitamin D deficiency, unspecified: Secondary | ICD-10-CM

## 2012-12-19 DIAGNOSIS — Z853 Personal history of malignant neoplasm of breast: Secondary | ICD-10-CM

## 2012-12-19 LAB — COMPREHENSIVE METABOLIC PANEL (CC13)
AST: 26 U/L (ref 5–34)
Albumin: 3.7 g/dL (ref 3.5–5.0)
Alkaline Phosphatase: 81 U/L (ref 40–150)
BUN: 20.9 mg/dL (ref 7.0–26.0)
Calcium: 9.2 mg/dL (ref 8.4–10.4)
Chloride: 100 mEq/L (ref 98–107)
Potassium: 4 mEq/L (ref 3.5–5.1)
Sodium: 139 mEq/L (ref 136–145)
Total Protein: 7.1 g/dL (ref 6.4–8.3)

## 2012-12-19 LAB — CBC WITH DIFFERENTIAL/PLATELET
Eosinophils Absolute: 0.1 10*3/uL (ref 0.0–0.5)
HCT: 41.4 % (ref 34.8–46.6)
LYMPH%: 31.9 % (ref 14.0–49.7)
MONO#: 0.7 10*3/uL (ref 0.1–0.9)
NEUT#: 3.4 10*3/uL (ref 1.5–6.5)
NEUT%: 54.8 % (ref 38.4–76.8)
Platelets: 202 10*3/uL (ref 145–400)
RBC: 4.27 10*6/uL (ref 3.70–5.45)
WBC: 6.3 10*3/uL (ref 3.9–10.3)
lymph#: 2 10*3/uL (ref 0.9–3.3)

## 2012-12-19 NOTE — Telephone Encounter (Signed)
Explained to the patient that the labs results that we have received thus far are normal, and her vitamin D level is still pending.  The patient voiced understanding.

## 2012-12-20 LAB — VITAMIN D 25 HYDROXY (VIT D DEFICIENCY, FRACTURES): Vit D, 25-Hydroxy: 45 ng/mL (ref 30–89)

## 2012-12-24 ENCOUNTER — Telehealth: Payer: Self-pay

## 2012-12-24 NOTE — Telephone Encounter (Signed)
LVMOM regarding lab result for 5/9. Per [facility], Vit D level ok. Call office with any questions. TMB

## 2012-12-26 ENCOUNTER — Encounter: Payer: Self-pay | Admitting: Family

## 2012-12-26 NOTE — Progress Notes (Deleted)
This encounter was created in error - please disregard.

## 2013-06-15 ENCOUNTER — Telehealth: Payer: Self-pay | Admitting: *Deleted

## 2013-06-15 NOTE — Telephone Encounter (Signed)
When does this patient need to come back? Last note with Dr. Kirke Corin was depending on her echo results. Please advise. Thanks

## 2013-06-16 NOTE — Telephone Encounter (Signed)
Spoke w/ pt.  Scheduled to see Dr. Kirke Corin 06/22/13 @ 2:15.

## 2013-06-18 ENCOUNTER — Other Ambulatory Visit: Payer: Self-pay

## 2013-06-22 ENCOUNTER — Ambulatory Visit (INDEPENDENT_AMBULATORY_CARE_PROVIDER_SITE_OTHER): Payer: Medicare Other | Admitting: Cardiovascular Disease

## 2013-06-22 ENCOUNTER — Encounter: Payer: Self-pay | Admitting: Cardiovascular Disease

## 2013-06-22 VITALS — BP 111/79 | HR 64 | Ht 65.0 in | Wt 164.8 lb

## 2013-06-22 DIAGNOSIS — R0602 Shortness of breath: Secondary | ICD-10-CM

## 2013-06-22 DIAGNOSIS — I10 Essential (primary) hypertension: Secondary | ICD-10-CM

## 2013-06-22 DIAGNOSIS — R0789 Other chest pain: Secondary | ICD-10-CM

## 2013-06-22 DIAGNOSIS — I4949 Other premature depolarization: Secondary | ICD-10-CM

## 2013-06-22 MED ORDER — CARVEDILOL 6.25 MG PO TABS: 6.2500 mg | ORAL_TABLET | Freq: Two times a day (BID) | ORAL | Status: DC

## 2013-06-22 MED ORDER — VALSARTAN 40 MG PO TABS: 40.0000 mg | ORAL_TABLET | Freq: Two times a day (BID) | ORAL | Status: DC

## 2013-06-22 NOTE — Assessment & Plan Note (Signed)
Blood pressure is well controlled on current medications. I advised her to continue taking carvedilol and valsartan.

## 2013-06-22 NOTE — Progress Notes (Signed)
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 She was seen by Dr. Gala Romney 2011. She was found to have breast CA in 94. Underwent R mastectomy in 1994 with flap reconstructiona and chemotherapy. In 2007 found to have local recurrence treated with XRT and chemo. Developed dyspnea in 2007 and found to have weakened R hemidiaphragm of unclear cause.  Myoview results showed EF 55% with anterior soft tissue attenuationa with question of mild reverible anterior ischemia. Thought to be breast attenuation.  Echo EF 45-50% with global HK.  Cardiac MRI EF 44% with septal HK.  Cath 6/11: EF 50-55% Left main was normal. Normal cors EF 50-55%  She has known history of alcohol and benzodiazepine abuse but attended successful inpatient detox program.  Echocardiogram in October of 2013 showed an ejection fraction of 50-55%. She reports no new symptoms. She is suffering from significant anxiety and depression which is currently being addressed. She takes carvedilol and Diovan for hypertension.  Allergies  Allergen Reactions  . Prednisone Other (See Comments)    Redness in the face and violent feelings     Current Outpatient Prescriptions on File Prior to Visit  Medication Sig Dispense Refill  . carvedilol (COREG) 6.25 MG tablet Take 1 tablet (6.25 mg total) by mouth 2 (two) times daily with a meal. For heart condition  180 tablet  3  . letrozole (FEMARA) 2.5 MG tablet Take 1 tablet (2.5 mg total) by mouth daily.  90 tablet  4   No current facility-administered medications on file prior to visit.     Past Medical History  Diagnosis Date  . Dyspnea     myoview 2011: EF 55% question of  mild reverible anterior defect. thought to be breast attenuation. echo 45-50% with global HK. Grade 1 diastolyic dysfunction. RV nml. cardiac MRI with EF 44% with septal HK. no scar   . Cancer       breast ca - 1994; recurred in 2007. s/p masectomy with flap rconstruction aand chemo '94. local recurrence on chest wall. chemo and XRT '07. now femara  . Lung disorder   . History of coronary artery stent placement   . Hyperlipidemia   . Hypertension   . Insomnia   . Alcoholism   . Depression      Past Surgical History  Procedure Laterality Date  . R transflap  '94  . R masectomy  '94   . Mastectomy    . Orif distal radius fracture    . Cardiac catheterization      [facility]  . Port-a-cath removal  07/29/2012    Procedure: REMOVAL PORT-A-CATH;  Surgeon: Currie Paris, MD;  Location: [facility];  Service: General;  Laterality: Left;     Family History  Problem Relation Age of Onset  . Coronary artery disease      family hx  . Hyperlipidemia      family hx  . Thyroid disease      family hx   . Hypertension Mother   . Alcohol abuse Paternal Grandfather   . Alcohol abuse Paternal Uncle   . Heart attack Father   . Alcohol abuse Father   . Cancer Paternal Grandmother     breast  . Alcohol abuse Brother      History   Social History  . Marital Status: Married  Spouse Name: N/A    Number of Children: N/A  . Years of Education: N/A   Occupational History  . Disabilty    Social History Main Topics  . Smoking status: Former Games developer  . Smokeless tobacco: Never Used     Comment: just socially   . Alcohol Use: No     Comment: No alcohol since 05/22/2012  . Drug Use: No  . Sexual Activity: Yes    Birth Control/ Protection: Surgical   Other Topics Concern  . Not on file   Social History Narrative   Suzanne [name] was born in [place] and lived there until high school, when she moved to [facility]. She graduated from the [employer] with a bachelor of science in nursing. 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 She has been married for 27 years, and has 2  children, a 59 year old son, and a 4 year old daughter. She denies any legal difficulties. She associates as a Curator.       PHYSICAL EXAM   BP 111/79  Pulse 64  Ht 5\' 5"  (1.651 m)  Wt 164 lb 12 oz (74.73 kg)  BMI 27.42 kg/m2  Constitutional: She is oriented to person, place, and time. She appears well-developed and well-nourished. No distress.  HENT: No nasal discharge.  Head: Normocephalic and atraumatic.  Eyes: Pupils are equal and round. Right eye exhibits no discharge. Left eye exhibits no discharge.  Neck: Normal range of motion. Neck supple. No JVD present. No thyromegaly present.  Cardiovascular: Normal rate, regular rhythm, normal heart sounds. Exam reveals no gallop and no friction rub. No murmur heard.  Pulmonary/Chest: Effort normal and breath sounds normal. No stridor. No respiratory distress. She has no wheezes. She has no rales. She exhibits no tenderness.  Abdominal: Soft. Bowel sounds are normal. She exhibits no distension. There is no tenderness. There is no rebound and no guarding.  Musculoskeletal: Normal range of motion. She exhibits no edema and no tenderness.  Neurological: She is alert and oriented to person, place, and time. Coordination normal.  Skin: Skin is warm and dry. No rash noted. She is not diaphoretic. No erythema. No pallor.  Psychiatric: She has a normal mood and affect. Her behavior is normal. Judgment and thought content normal.    EKG: Sinus  Rhythm  - occasional ectopic ventricular beat    WITHIN NORMAL LIMITS  ASSESSMENT AND PLAN

## 2013-06-22 NOTE — Patient Instructions (Signed)
Continue same medications.   Your physician wants you to follow-up in: 12 months.  You will receive a reminder letter in the mail two months in advance. If you don't receive a letter, please call our office to schedule the follow-up appointment.  

## 2013-06-22 NOTE — Assessment & Plan Note (Signed)
Under reasonable control with carvedilol.

## 2013-06-23 ENCOUNTER — Ambulatory Visit (INDEPENDENT_AMBULATORY_CARE_PROVIDER_SITE_OTHER): Payer: 59 | Admitting: Psychiatry

## 2013-06-23 DIAGNOSIS — F322 Major depressive disorder, single episode, severe without psychotic features: Secondary | ICD-10-CM

## 2013-06-23 DIAGNOSIS — F1020 Alcohol dependence, uncomplicated: Secondary | ICD-10-CM

## 2013-06-24 ENCOUNTER — Telehealth: Payer: Self-pay | Admitting: *Deleted

## 2013-06-24 NOTE — Telephone Encounter (Signed)
Patient advised to make appointment in the office. I have not seen the patient personally in several months. If doctor Oneta Rack prefers to prescribe it he can.

## 2013-06-24 NOTE — Telephone Encounter (Signed)
PT SAW A PHYC DR YESTERDAY GENE PETERS WITH [facility] . THEY SUGGEST PT START ON A ANTI- DEPRESSANT BUT HE CAN NOT RX MED. PT SAID HE HAD THE SUGGESTION OF EFFEXOR?  FOR PT WITH DEPRESSION WITH CA.

## 2013-06-30 ENCOUNTER — Ambulatory Visit: Payer: Medicare Other | Admitting: Emergency Medicine

## 2013-06-30 ENCOUNTER — Ambulatory Visit (INDEPENDENT_AMBULATORY_CARE_PROVIDER_SITE_OTHER): Payer: 59 | Admitting: Psychiatry

## 2013-06-30 ENCOUNTER — Ambulatory Visit: Payer: Medicare Other | Admitting: Physician Assistant

## 2013-06-30 ENCOUNTER — Encounter: Payer: Self-pay | Admitting: Physician Assistant

## 2013-06-30 VITALS — BP 102/60 | HR 56 | Temp 98.1°F | Resp 16 | Ht 64.5 in | Wt 164.0 lb

## 2013-06-30 DIAGNOSIS — F1020 Alcohol dependence, uncomplicated: Secondary | ICD-10-CM

## 2013-06-30 DIAGNOSIS — F322 Major depressive disorder, single episode, severe without psychotic features: Secondary | ICD-10-CM

## 2013-06-30 DIAGNOSIS — F329 Major depressive disorder, single episode, unspecified: Secondary | ICD-10-CM

## 2013-06-30 MED ORDER — VENLAFAXINE HCL ER 75 MG PO CP24: 75.0000 mg | ORAL_CAPSULE | Freq: Two times a day (BID) | ORAL | Status: DC

## 2013-06-30 NOTE — Progress Notes (Signed)
HPI Patient presents for a follow up for severe depression. She states that she is seeing Dr. Noe Gens for not getting out of bed, she had left over Effexor from hot flashes which she knows she has tolerated and she states she had been getting out of bed and feeling better. Denies suicidal ideation and homicidal ideation.  Past Medical History  Diagnosis Date  . Dyspnea     myoview 2011: EF 55% question of  mild reverible anterior defect. thought to be breast attenuation. echo 45-50% with global HK. Grade 1 diastolyic dysfunction. RV nml. cardiac MRI with EF 44% with septal HK. no scar   . Cancer     breast ca - 1994; recurred in 2007. s/p masectomy with flap rconstruction aand chemo '94. local recurrence on chest wall. chemo and XRT '07. now femara  . Lung disorder   . History of coronary artery stent placement   . Hyperlipidemia   . Hypertension   . Insomnia   . Alcoholism   . Depression    Allergies:  Allergies  Allergen Reactions  . Prednisone Other (See Comments)    Redness in the face and violent feelings    Current Medications:   Current Outpatient Prescriptions on File Prior to Visit  Medication Sig Dispense Refill  . carvedilol (COREG) 6.25 MG tablet Take 1 tablet (6.25 mg total) by mouth 2 (two) times daily with a meal. For heart condition  180 tablet  3  . letrozole (FEMARA) 2.5 MG tablet Take 1 tablet (2.5 mg total) by mouth daily.  90 tablet  4  . valsartan (DIOVAN) 40 MG tablet Take 1 tablet (40 mg total) by mouth 2 (two) times daily.  60 tablet  11   No current facility-administered medications on file prior to visit.    ROS: all negative expect above.   Physical: Filed Weights   06/30/13 1541  Weight: 164 lb (74.39 kg)   Filed Vitals:   06/30/13 1541  BP: 102/60  Pulse: 56  Temp: 98.1 F (36.7 C)  Resp: 16   General Appearance: Well nourished, in no apparent distress. Eyes: PERRLA, EOMs. Sinuses: No Frontal/maxillary tenderness ENT/Mouth: Ext aud  canals clear, normal light reflex with TMs without erythema, bulging. Post pharynx without erythema, swelling, exudate.  Respiratory: CTAB Cardio: RRR, no murmurs, rubs or gallops. Peripheral pulses brisk and equal bilaterally, without edema. No aortic or femoral bruits. Abdomen: Flat, soft, with bowl sounds. Nontender, no guarding, rebound. Lymphatics: Non tender without lymphadenopathy.  Musculoskeletal: Full ROM all peripheral extremities, 5/5 strength, and normal gait. Skin: Warm, dry without rashes, lesions, ecchymosis.  Neuro: Cranial nerves intact, reflexes equal bilaterally. Normal muscle tone, no cerebellar symptoms. Sensation intact.  Pysch: Awake and oriented X 3, normal affect, Insight and Judgment appropriate.   Assessment and Plan: 1. Depression Patient will start one a day and may increase to two a day.  Will follow up with Dr. Noe Gens and will follow up in one month.  - venlafaxine XR (EFFEXOR XR) 75 MG 24 hr capsule; Take 1 capsule (75 mg total) by mouth 2 (two) times daily.  Dispense: 60 capsule; Refill: 2

## 2013-07-02 NOTE — Progress Notes (Signed)
This encounter was created in error - please disregard.

## 2013-07-07 ENCOUNTER — Ambulatory Visit (HOSPITAL_COMMUNITY)
Admission: RE | Admit: 2013-07-07 | Discharge: 2013-07-07 | Disposition: A | Discharge status: Home / Self Care | Payer: Medicare Other | Source: Ambulatory Visit | Attending: Family | Admitting: Family

## 2013-07-07 ENCOUNTER — Encounter (HOSPITAL_COMMUNITY): Payer: Self-pay

## 2013-07-07 DIAGNOSIS — R918 Other nonspecific abnormal finding of lung field: Secondary | ICD-10-CM | POA: Insufficient documentation

## 2013-07-07 DIAGNOSIS — C50919 Malignant neoplasm of unspecified site of unspecified female breast: Secondary | ICD-10-CM | POA: Insufficient documentation

## 2013-07-07 DIAGNOSIS — Z853 Personal history of malignant neoplasm of breast: Secondary | ICD-10-CM

## 2013-07-07 MED ORDER — IOHEXOL 300 MG/ML  SOLN
80.0000 mL | Freq: Once | INTRAMUSCULAR | Status: AC | PRN
  Administered 2013-07-07: 80 mL via INTRAVENOUS

## 2013-07-10 ENCOUNTER — Other Ambulatory Visit: Payer: Self-pay | Admitting: Emergency Medicine

## 2013-07-10 DIAGNOSIS — Z853 Personal history of malignant neoplasm of breast: Secondary | ICD-10-CM

## 2013-07-13 ENCOUNTER — Other Ambulatory Visit (HOSPITAL_BASED_OUTPATIENT_CLINIC_OR_DEPARTMENT_OTHER): Payer: Medicare Other | Admitting: Lab

## 2013-07-13 ENCOUNTER — Telehealth: Payer: Self-pay | Admitting: *Deleted

## 2013-07-13 ENCOUNTER — Encounter: Payer: Self-pay | Admitting: Oncology

## 2013-07-13 ENCOUNTER — Ambulatory Visit (HOSPITAL_BASED_OUTPATIENT_CLINIC_OR_DEPARTMENT_OTHER): Payer: Medicare Other | Admitting: Oncology

## 2013-07-13 VITALS — BP 109/74 | HR 84 | Temp 98.2°F | Resp 18 | Ht 64.0 in | Wt 165.5 lb

## 2013-07-13 DIAGNOSIS — R911 Solitary pulmonary nodule: Secondary | ICD-10-CM

## 2013-07-13 DIAGNOSIS — Z853 Personal history of malignant neoplasm of breast: Secondary | ICD-10-CM

## 2013-07-13 DIAGNOSIS — C773 Secondary and unspecified malignant neoplasm of axilla and upper limb lymph nodes: Secondary | ICD-10-CM

## 2013-07-13 DIAGNOSIS — C44599 Other specified malignant neoplasm of skin of other part of trunk: Secondary | ICD-10-CM

## 2013-07-13 DIAGNOSIS — C44509 Unspecified malignant neoplasm of skin of other part of trunk: Secondary | ICD-10-CM

## 2013-07-13 DIAGNOSIS — G8929 Other chronic pain: Secondary | ICD-10-CM

## 2013-07-13 LAB — COMPREHENSIVE METABOLIC PANEL (CC13)
ALT: 29 U/L (ref 0–55)
AST: 28 U/L (ref 5–34)
Albumin: 4.2 g/dL (ref 3.5–5.0)
Alkaline Phosphatase: 69 U/L (ref 40–150)
BUN: 16 mg/dL (ref 7.0–26.0)
Calcium: 9.8 mg/dL (ref 8.4–10.4)
Chloride: 100 mEq/L (ref 98–109)
Potassium: 3.7 mEq/L (ref 3.5–5.1)
Sodium: 139 mEq/L (ref 136–145)

## 2013-07-13 LAB — CBC WITH DIFFERENTIAL/PLATELET
BASO%: 0.5 % (ref 0.0–2.0)
EOS%: 0.8 % (ref 0.0–7.0)
HGB: 14.8 g/dL (ref 11.6–15.9)
MCH: 34 pg (ref 25.1–34.0)
MCHC: 33.9 g/dL (ref 31.5–36.0)
MCV: 100.4 fL (ref 79.5–101.0)
MONO%: 8.6 % (ref 0.0–14.0)
RBC: 4.34 10*6/uL (ref 3.70–5.45)
RDW: 13.2 % (ref 11.2–14.5)
lymph#: 2.1 10*3/uL (ref 0.9–3.3)

## 2013-07-13 MED ORDER — LETROZOLE 2.5 MG PO TABS: 2.5000 mg | ORAL_TABLET | Freq: Every day | ORAL | Status: DC

## 2013-07-13 NOTE — Telephone Encounter (Signed)
appts made and printed. Pt is aware that cs will call with he CT appt...td
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
## 2013-07-15 ENCOUNTER — Ambulatory Visit: Payer: Medicare Other | Admitting: Psychiatry

## 2013-08-19 ENCOUNTER — Other Ambulatory Visit: Payer: Self-pay | Admitting: Physician Assistant

## 2013-08-26 ENCOUNTER — Ambulatory Visit (INDEPENDENT_AMBULATORY_CARE_PROVIDER_SITE_OTHER): Payer: 59 | Admitting: Psychiatry

## 2013-08-26 DIAGNOSIS — F1020 Alcohol dependence, uncomplicated: Secondary | ICD-10-CM

## 2013-08-26 DIAGNOSIS — F322 Major depressive disorder, single episode, severe without psychotic features: Secondary | ICD-10-CM

## 2013-08-26 DIAGNOSIS — Z630 Problems in relationship with spouse or partner: Secondary | ICD-10-CM

## 2013-09-09 ENCOUNTER — Ambulatory Visit: Payer: Medicare Other | Admitting: Psychiatry

## 2013-09-10 ENCOUNTER — Ambulatory Visit (INDEPENDENT_AMBULATORY_CARE_PROVIDER_SITE_OTHER): Payer: 59 | Admitting: Psychiatry

## 2013-09-10 DIAGNOSIS — Z630 Problems in relationship with spouse or partner: Secondary | ICD-10-CM

## 2013-09-10 DIAGNOSIS — F322 Major depressive disorder, single episode, severe without psychotic features: Secondary | ICD-10-CM

## 2013-09-10 DIAGNOSIS — F1020 Alcohol dependence, uncomplicated: Secondary | ICD-10-CM

## 2013-09-10 DIAGNOSIS — Z7189 Other specified counseling: Secondary | ICD-10-CM

## 2013-09-24 ENCOUNTER — Ambulatory Visit: Payer: Medicare Other | Admitting: Psychiatry

## 2013-12-07 LAB — URINALYSIS, COMPLETE
BILIRUBIN, UR: NEGATIVE
BLOOD: NEGATIVE
Bacteria: NONE SEEN
Glucose,UR: NEGATIVE mg/dL (ref 0–75)
Ketone: NEGATIVE
LEUKOCYTE ESTERASE: NEGATIVE
NITRITE: NEGATIVE
PH: 7 (ref 4.5–8.0)
PROTEIN: NEGATIVE
RBC,UR: NONE SEEN /HPF (ref 0–5)
Specific Gravity: 1.003 (ref 1.003–1.030)
Squamous Epithelial: <1
WBC UR: <1 /HPF (ref 0–5)

## 2013-12-07 LAB — SALICYLATE LEVEL: Salicylates, Serum: <1.7 mg/dL

## 2013-12-07 LAB — ACETAMINOPHEN LEVEL

## 2013-12-07 LAB — DRUG SCREEN, URINE

## 2013-12-07 LAB — COMPREHENSIVE METABOLIC PANEL
ALT: 35 U/L (ref 12–78)
ANION GAP: 6 — AB (ref 7–16)
AST: 40 U/L — AB (ref 15–37)
Albumin: 4 g/dL (ref 3.4–5.0)
Alkaline Phosphatase: 77 U/L
BUN: 17 mg/dL (ref 7–18)
Bilirubin,Total: 0.6 mg/dL (ref 0.2–1.0)
CHLORIDE: 101 mmol/L (ref 98–107)
CO2: 29 mmol/L (ref 21–32)
Calcium, Total: 8.8 mg/dL (ref 8.5–10.1)
Creatinine: 0.66 mg/dL (ref 0.60–1.30)
EGFR (African American): >60
EGFR (Non-African Amer.): >60
Glucose: 102 mg/dL — ABNORMAL HIGH (ref 65–99)
OSMOLALITY: 274 (ref 275–301)
Potassium: 3.8 mmol/L (ref 3.5–5.1)
SODIUM: 136 mmol/L (ref 136–145)
Total Protein: 7.7 g/dL (ref 6.4–8.2)

## 2013-12-07 LAB — CBC
HCT: 42.7 % (ref 35.0–47.0)
HGB: 14.7 g/dL (ref 12.0–16.0)
MCH: 34.7 pg — AB (ref 26.0–34.0)
MCHC: 34.5 g/dL (ref 32.0–36.0)
MCV: 101 fL — ABNORMAL HIGH (ref 80–100)
PLATELETS: 203 10*3/uL (ref 150–440)
RBC: 4.24 10*6/uL (ref 3.80–5.20)
RDW: 14 % (ref 11.5–14.5)
WBC: 5.5 10*3/uL (ref 3.6–11.0)

## 2013-12-07 LAB — ETHANOL
Ethanol %: <0.003 % (ref 0.000–0.080)
Ethanol: <3 mg/dL

## 2013-12-08 ENCOUNTER — Inpatient Hospital Stay: Payer: Self-pay | Admitting: Psychiatry

## 2013-12-29 ENCOUNTER — Telehealth: Payer: Self-pay | Admitting: Adult Health

## 2013-12-29 ENCOUNTER — Encounter: Payer: Self-pay | Admitting: *Deleted

## 2013-12-29 NOTE — Telephone Encounter (Signed)
, °

## 2013-12-31 ENCOUNTER — Ambulatory Visit (INDEPENDENT_AMBULATORY_CARE_PROVIDER_SITE_OTHER): Payer: Medicare Other | Admitting: Emergency Medicine

## 2013-12-31 ENCOUNTER — Encounter: Payer: Self-pay | Admitting: Emergency Medicine

## 2013-12-31 VITALS — BP 98/62 | HR 58 | Temp 98.0°F | Resp 18 | Ht 65.0 in | Wt 160.0 lb

## 2013-12-31 DIAGNOSIS — F3289 Other specified depressive episodes: Secondary | ICD-10-CM

## 2013-12-31 DIAGNOSIS — F329 Major depressive disorder, single episode, unspecified: Secondary | ICD-10-CM

## 2013-12-31 DIAGNOSIS — F32A Depression, unspecified: Secondary | ICD-10-CM

## 2013-12-31 MED ORDER — DULOXETINE HCL 30 MG PO CPEP: 30.0000 mg | ORAL_CAPSULE | Freq: Every day | ORAL | Status: DC

## 2013-12-31 NOTE — Patient Instructions (Signed)
Depression, Adult °Depression is feeling sad, low, down in the dumps, blue, gloomy, or empty. In general, there are two kinds of depression: °· Normal sadness or grief. This can happen after something upsetting. It often goes away on its own within 2 weeks. After losing a loved one (bereavement), normal sadness and grief may last longer than two weeks. It usually gets better with time. °· Clinical depression. This kind lasts longer than normal sadness or grief. It keeps you from doing the things you normally do in life. It is often hard to function at home, work, or at school. It may affect your relationships with others. Treatment is often needed. °GET HELP RIGHT AWAY IF: °· You have thoughts about hurting yourself or others. °· You lose touch with reality (psychotic symptoms). You may: °· See or hear things that are not real. °· Have untrue beliefs about your life or people around you. °· Your medicine is giving you problems. °MAKE SURE YOU: °· Understand these instructions. °· Will watch your condition. °· Will get help right away if you are not doing well or get worse. °Document Released: 09/01/2010 Document Revised: 04/23/2012 Document Reviewed: 11/29/2011 °ExitCare® Patient Information ©2014 [employer]. ° °
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
## 2014-01-06 ENCOUNTER — Ambulatory Visit (HOSPITAL_COMMUNITY): Payer: Medicare Other

## 2014-01-08 ENCOUNTER — Other Ambulatory Visit: Payer: Self-pay | Admitting: Cardiovascular Disease

## 2014-01-08 ENCOUNTER — Encounter (HOSPITAL_COMMUNITY): Payer: Self-pay

## 2014-01-08 ENCOUNTER — Ambulatory Visit (HOSPITAL_COMMUNITY)
Admission: RE | Admit: 2014-01-08 | Discharge: 2014-01-08 | Disposition: A | Discharge status: Home / Self Care | Payer: Medicare Other | Source: Ambulatory Visit | Attending: Oncology | Admitting: Oncology

## 2014-01-08 DIAGNOSIS — J984 Other disorders of lung: Secondary | ICD-10-CM | POA: Insufficient documentation

## 2014-01-08 DIAGNOSIS — Z853 Personal history of malignant neoplasm of breast: Secondary | ICD-10-CM

## 2014-01-08 DIAGNOSIS — C50919 Malignant neoplasm of unspecified site of unspecified female breast: Secondary | ICD-10-CM | POA: Insufficient documentation

## 2014-01-08 MED ORDER — IOHEXOL 300 MG/ML  SOLN
80.0000 mL | Freq: Once | INTRAMUSCULAR | Status: AC | PRN
  Administered 2014-01-08: 80 mL via INTRAVENOUS

## 2014-01-11 ENCOUNTER — Ambulatory Visit: Payer: Self-pay | Admitting: Oncology

## 2014-01-13 ENCOUNTER — Ambulatory Visit (HOSPITAL_BASED_OUTPATIENT_CLINIC_OR_DEPARTMENT_OTHER): Payer: Medicare Other | Admitting: Adult Health

## 2014-01-13 ENCOUNTER — Telehealth: Payer: Self-pay | Admitting: Adult Health

## 2014-01-13 ENCOUNTER — Encounter: Payer: Self-pay | Admitting: Adult Health

## 2014-01-13 VITALS — BP 131/83 | HR 71 | Temp 98.1°F | Resp 18 | Ht 65.0 in | Wt 173.6 lb

## 2014-01-13 DIAGNOSIS — C44509 Unspecified malignant neoplasm of skin of other part of trunk: Secondary | ICD-10-CM

## 2014-01-13 DIAGNOSIS — C44599 Other specified malignant neoplasm of skin of other part of trunk: Secondary | ICD-10-CM

## 2014-01-13 DIAGNOSIS — C773 Secondary and unspecified malignant neoplasm of axilla and upper limb lymph nodes: Secondary | ICD-10-CM

## 2014-01-13 DIAGNOSIS — Z853 Personal history of malignant neoplasm of breast: Secondary | ICD-10-CM

## 2014-01-13 DIAGNOSIS — Z79811 Long term (current) use of aromatase inhibitors: Secondary | ICD-10-CM

## 2014-01-13 DIAGNOSIS — Z170 Estrogen receptor positive status [ER+]: Secondary | ICD-10-CM

## 2014-01-13 NOTE — Patient Instructions (Signed)
You are doing well.  You have no sign of recurrence.  Continue Femara.  I recommend healthy diet, exercise, and self breast exams.  Please call us if you have any questions or concerns.    We will see you back in 6 months.    Breast Self-Awareness Practicing breast self-awareness may pick up problems early, prevent significant medical complications, and possibly save your life. By practicing breast self-awareness, you can become familiar with how your breasts look and feel and if your breasts are changing. This allows you to notice changes early. It can also offer you some reassurance that your breast health is good. One way to learn what is normal for your breasts and whether your breasts are changing is to do a breast self-exam. If you find a lump or something that was not present in the past, it is best to contact your caregiver right away. Other findings that should be evaluated by your caregiver include nipple discharge, especially if it is bloody; skin changes or reddening; areas where the skin seems to be pulled in (retracted); or new lumps and bumps. Breast pain is seldom associated with cancer (malignancy), but should also be evaluated by a caregiver. HOW TO PERFORM A BREAST SELF-EXAM The best time to examine your breasts is 5 7 days after your menstrual period is over. During menstruation, the breasts are lumpier, and it may be more difficult to pick up changes. If you do not menstruate, have reached menopause, or had your uterus removed (hysterectomy), you should examine your breasts at regular intervals, such as monthly. If you are breastfeeding, examine your breasts after a feeding or after using a breast pump. Breast implants do not decrease the risk for lumps or tumors, so continue to perform breast self-exams as recommended. Talk to your caregiver about how to determine the difference between the implant and breast tissue. Also, talk about the amount of pressure you should use during the  exam. Over time, you will become more familiar with the variations of your breasts and more comfortable with the exam. A breast self-exam requires you to remove all your clothes above the waist. 1. Look at your breasts and nipples. Stand in front of a mirror in a room with good lighting. With your hands on your hips, push your hands firmly downward. Look for a difference in shape, contour, and size from one breast to the other (asymmetry). Asymmetry includes puckers, dips, or bumps. Also, look for skin changes, such as reddened or scaly areas on the breasts. Look for nipple changes, such as discharge, dimpling, repositioning, or redness. 2. Carefully feel your breasts. This is best done either in the shower or tub while using soapy water or when flat on your back. Place the arm (on the side of the breast you are examining) above your head. Use the pads (not the fingertips) of your three middle fingers on your opposite hand to feel your breasts. Start in the underarm area and use  inch (2 cm) overlapping circles to feel your breast. Use 3 different levels of pressure (light, medium, and firm pressure) at each circle before moving to the next circle. The light pressure is needed to feel the tissue closest to the skin. The medium pressure will help to feel breast tissue a little deeper, while the firm pressure is needed to feel the tissue close to the ribs. Continue the overlapping circles, moving downward over the breast until you feel your ribs below your breast. Then, move one finger-width  towards the center of the body. Continue to use the  inch (2 cm) overlapping circles to feel your breast as you move slowly up toward the collar bone (clavicle) near the base of the neck. Continue the up and down exam using all 3 pressures until you reach the middle of the chest. Do this with each breast, carefully feeling for lumps or changes. 3.  Keep a written record with breast changes or normal findings for each breast.  By writing this information down, you do not need to depend only on memory for size, tenderness, or location. Write down where you are in your menstrual cycle, if you are still menstruating. Breast tissue can have some lumps or thick tissue. However, see your caregiver if you find anything that concerns you.  SEEK MEDICAL CARE IF:  You see a change in shape, contour, or size of your breasts or nipples.   You see skin changes, such as reddened or scaly areas on the breasts or nipples.   You have an unusual discharge from your nipples.   You feel a new lump or unusually thick areas.  Document Released: 07/30/2005 Document Revised: 07/16/2012 Document Reviewed: 11/14/2011 [facility] Patient Information 2014 [employer].

## 2014-01-13 NOTE — Telephone Encounter (Signed)
, °
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
## 2014-01-14 ENCOUNTER — Other Ambulatory Visit: Payer: Self-pay | Admitting: *Deleted

## 2014-01-14 DIAGNOSIS — Z853 Personal history of malignant neoplasm of breast: Secondary | ICD-10-CM

## 2014-01-14 MED ORDER — LETROZOLE 2.5 MG PO TABS: 2.5000 mg | ORAL_TABLET | Freq: Every day | ORAL | Status: DC

## 2014-01-26 ENCOUNTER — Encounter: Payer: Self-pay | Admitting: Internal Medicine

## 2014-01-26 ENCOUNTER — Ambulatory Visit (INDEPENDENT_AMBULATORY_CARE_PROVIDER_SITE_OTHER): Payer: Medicare Other | Admitting: Internal Medicine

## 2014-01-26 VITALS — BP 124/76 | HR 59 | Ht 68.0 in | Wt 173.0 lb

## 2014-01-26 DIAGNOSIS — R0602 Shortness of breath: Secondary | ICD-10-CM

## 2014-01-26 DIAGNOSIS — I10 Essential (primary) hypertension: Secondary | ICD-10-CM

## 2014-01-26 MED ORDER — BISOPROLOL FUMARATE 5 MG PO TABS: 5.0000 mg | ORAL_TABLET | Freq: Every day | ORAL | Status: DC

## 2014-01-26 NOTE — Progress Notes (Signed)
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 Advice Only    Was seeing Dr. Patsey Berthold for SOB, has increased in the past several months.    noted decreased activity tol with 30 lb wt gain and now sometimes breathing uncomfortable sitting still, stops just walking across her yard, back from mailbox stopping no real change p saba and worse x sev months, coughs in am so hard vomits, produces white mucus but doesn't wake up but starts when gets to BR p stirring.   No obvious other patterns in day to day or daytime variabilty or assoc chronic cough or cp or chest tightness, subjective wheeze overt sinus or hb symptoms. No unusual exp hx or h/o childhood pna/ asthma or knowledge of premature birth.  Sleeping ok without nocturnal  or early am exacerbation  of respiratory  c/o's or need for noct saba. Also denies any obvious fluctuation of symptoms with weather or environmental changes or other aggravating or alleviating factors except as outlined above   Current Medications, Allergies, Complete Past Medical History, Past Surgical History, Family History, and Social History were reviewed in [employer] record.           Review of Systems  Constitutional: Negative for fever and unexpected weight change.  HENT: Negative for congestion, dental problem, ear pain, nosebleeds, postnasal drip, rhinorrhea, sinus pressure, sneezing, sore throat and trouble swallowing.   Eyes: Negative for redness and itching.  Respiratory: Positive for cough and shortness of breath. Negative for chest tightness and  wheezing.   Cardiovascular: Negative for palpitations and leg swelling.  Gastrointestinal: Negative for nausea and vomiting.  Genitourinary: Negative for dysuria.  Musculoskeletal: Negative for joint swelling.  Skin: Negative for rash.  Neurological: Negative for headaches.  Hematological: Does not bruise/bleed easily.  Psychiatric/Behavioral: Negative for dysphoric mood. The patient is not nervous/anxious.        Objective:   Physical Exam   amb wf nad  Wt Readings from Last 3 Encounters:  01/26/14 173 lb (78.472 kg)  01/13/14 173 lb 9.6 oz (78.744 kg)  12/31/13 160 lb (72.576 kg)      HEENT: nl dentition, turbinates, and orophanx. Nl external ear canals without cough reflex   NECK :  without JVD/Nodes/TM/ nl carotid upstrokes bilaterally   LUNGS: no acc muscle use, decreased bs on R    CV:  RRR  no s3 or murmur or increase in P2, no edema   ABD:  soft and nontender with nl excursion in the supine position. No bruits or organomegaly, bowel sounds nl  MS:  warm without deformities, calf tenderness, cyanosis or clubbing  SKIN: warm and dry without lesions    NEURO:  alert, approp, no deficits   Between 12/2005 and 06/2006 progressive R lung atx and elevated hd and 07/02/2006 sluggish R hd on fluoro but no paradox   CT 01/08/14  1. Nodule along the inferior aspect of the left major fissure is  likely stable from 07/07/2013 but has enlarged from 06/24/2012.  Additional  followup with CT chest without contrast in 6-12 months is  recommended.  2. Fluid collection along the lateral right breast has decreased  slightly in size in the interval and is indicative of a  postoperative seroma.  3. Hepatic steatosis.          Assessment & Plan:

## 2014-01-26 NOTE — Patient Instructions (Addendum)
Bisoprolol 5mg  each am in place of coreg to see what effect this has on your exercise tolerance   Pepcid ac 20 mg at bedtime plus chlortrimeton 4mg  at bedtime  Incentive spirometry and 30 min of aerobic exercise daily - short of breath not out of breath   Please schedule a follow up office visit in 4 weeks, sooner if needed with pfts and cxr

## 2014-01-27 NOTE — Assessment & Plan Note (Signed)
Strongly prefer in this setting: Bystolic, the most beta -1  selective Beta blocker available in sample form, with bisoprolol the most selective generic choice  on the market.   rx bisoprolol 5 mg daily as the purpose was also rate control

## 2014-01-27 NOTE — Assessment & Plan Note (Addendum)
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  ? If she has any asthma at all that the coreg is aggravating > try bisoprolol (see hbp)  ? If she has any noct gerd or pnds contributing at am cough > try pepcid and chlortrimeton at hs  ? Anxiety/ depression/ wt gain also contributing to new symptoms > try paced exercise   Return in 4 weeks with pfts

## 2014-02-03 ENCOUNTER — Ambulatory Visit (INDEPENDENT_AMBULATORY_CARE_PROVIDER_SITE_OTHER): Payer: 59 | Admitting: Psychiatry

## 2014-02-03 DIAGNOSIS — Z630 Problems in relationship with spouse or partner: Secondary | ICD-10-CM

## 2014-02-03 DIAGNOSIS — F322 Major depressive disorder, single episode, severe without psychotic features: Secondary | ICD-10-CM

## 2014-02-03 DIAGNOSIS — F1020 Alcohol dependence, uncomplicated: Secondary | ICD-10-CM

## 2014-02-18 ENCOUNTER — Ambulatory Visit (INDEPENDENT_AMBULATORY_CARE_PROVIDER_SITE_OTHER): Payer: 59 | Admitting: Psychiatry

## 2014-02-18 DIAGNOSIS — Z630 Problems in relationship with spouse or partner: Secondary | ICD-10-CM

## 2014-02-18 DIAGNOSIS — F1020 Alcohol dependence, uncomplicated: Secondary | ICD-10-CM

## 2014-02-18 DIAGNOSIS — F322 Major depressive disorder, single episode, severe without psychotic features: Secondary | ICD-10-CM

## 2014-03-01 ENCOUNTER — Other Ambulatory Visit: Payer: Self-pay | Admitting: Internal Medicine

## 2014-03-01 DIAGNOSIS — R0602 Shortness of breath: Secondary | ICD-10-CM

## 2014-03-02 ENCOUNTER — Ambulatory Visit (INDEPENDENT_AMBULATORY_CARE_PROVIDER_SITE_OTHER): Payer: Medicare Other | Admitting: Internal Medicine

## 2014-03-02 ENCOUNTER — Ambulatory Visit (INDEPENDENT_AMBULATORY_CARE_PROVIDER_SITE_OTHER)
Admission: RE | Admit: 2014-03-02 | Discharge: 2014-03-02 | Disposition: A | Discharge status: Home / Self Care | Payer: Medicare Other | Source: Ambulatory Visit | Attending: Internal Medicine | Admitting: Internal Medicine

## 2014-03-02 ENCOUNTER — Encounter: Payer: Self-pay | Admitting: Internal Medicine

## 2014-03-02 VITALS — BP 102/64 | HR 50 | Temp 98.0°F | Ht 63.5 in | Wt 169.0 lb

## 2014-03-02 DIAGNOSIS — R0602 Shortness of breath: Secondary | ICD-10-CM

## 2014-03-02 DIAGNOSIS — I10 Essential (primary) hypertension: Secondary | ICD-10-CM

## 2014-03-02 MED ORDER — PANTOPRAZOLE SODIUM 40 MG PO TBEC: 40.0000 mg | DELAYED_RELEASE_TABLET | Freq: Every day | ORAL | Status: DC

## 2014-03-02 MED ORDER — BUDESONIDE-FORMOTEROL FUMARATE 80-4.5 MCG/ACT IN AERO
INHALATION_SPRAY | RESPIRATORY_TRACT | Status: DC
Start: 2014-03-02 — End: 2015-04-21

## 2014-03-02 MED ORDER — BISOPROLOL FUMARATE 5 MG PO TABS: 5.0000 mg | ORAL_TABLET | Freq: Every day | ORAL | Status: DC

## 2014-03-02 NOTE — Progress Notes (Signed)
PFT done today. 

## 2014-03-02 NOTE — Progress Notes (Signed)
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 Advice Only    Was seeing Dr. Patsey [name] for SOB, has increased in the past several months.    noted decreased activity tol with 30 lb wt gain and now sometimes breathing uncomfortable sitting still, stops just walking across her yard, back from mailbox stopping no real change p saba and worse x sev months, coughs in am so hard vomits, produces white mucus but doesn't wake up but starts when gets to BR p stirring. rec Bisoprolol 5mg  each am in place of coreg to see what effect this has on your exercise tolerance  Pepcid ac 20 mg at bedtime plus chlortrimeton 4mg  at bedtime Incentive spirometry and 30 min of aerobic exercise daily - short of breath not out of breath   03/02/2014 f/u ov/Suzanne [name] re:  ? Asthma  Chief Complaint  Patient presents with  . Followup with cxr and pft's    Pt states that her breathing has been worse for the past 3-4 days. She states that she is SOB with any exertion.      No obvious day to day or daytime variabilty or assoc chronic cough or cp or chest tightness, subjective wheeze overt sinus or hb symptoms. No unusual exp hx or h/o childhood pna/ asthma or knowledge of premature birth.  Sleeping ok without nocturnal  or early am exacerbation  of respiratory  c/o's or need for noct saba. Also denies any obvious fluctuation of symptoms with weather or environmental changes or other aggravating or alleviating factors except as outlined above   Current Medications, Allergies, Complete Past  Medical History, Past Surgical History, Family History, and Social History were reviewed in [employer] record.  ROS  The following are not active complaints unless bolded sore throat, dysphagia, dental problems, itching, sneezing,  nasal congestion or excess/ purulent secretions, ear ache,   fever, chills, sweats, unintended wt loss, pleuritic or exertional cp, hemoptysis,  orthopnea pnd or leg swelling, presyncope, palpitations, heartburn, abdominal pain, anorexia, nausea, vomiting, diarrhea  or change in bowel or urinary habits, change in stools or urine, dysuria,hematuria,  rash, arthralgias, visual complaints, headache, numbness weakness or ataxia or problems with walking or coordination,  change in mood/affect or memory.                         Objective:   Physical Exam   amb wf nad   03/02/2014        169  Wt Readings from Last 3 Encounters:  01/26/14 173 lb (78.472 kg)  01/13/14 173 lb 9.6 oz (78.744 kg)  12/31/13 160 lb (72.576 kg)      HEENT: nl dentition, turbinates, and orophanx. Nl external ear canals without cough reflex   NECK :  without JVD/Nodes/TM/ nl carotid upstrokes bilaterally   LUNGS: no acc muscle use, decreased bs on R    CV:  RRR  no s3 or murmur or  increase in P2, no edema   ABD:  soft and nontender with nl excursion in the supine position. No bruits or organomegaly, bowel sounds nl  MS:  warm without deformities, calf tenderness, cyanosis or clubbing  SKIN: warm and dry without lesions    NEURO:  alert, approp, no deficits   Between 12/2005 and 06/2006 progressive R lung atx and elevated hd and 07/02/2006 sluggish R hd on fluoro but no paradox   CT 01/08/14  1. Nodule along the inferior aspect of the left major fissure is  likely stable from 07/07/2013 but has enlarged from 06/24/2012.  Additional followup with CT chest without contrast in 6-12 months is  recommended.  2. Fluid collection along the lateral right  breast has decreased  slightly in size in the interval and is indicative of a  postoperative seroma.  3. Hepatic steatosis.          Assessment & Plan:

## 2014-03-02 NOTE — Patient Instructions (Addendum)
symbicort 80 up to 2 puffs every 12 hour to see if can turn a bad day into a good one  Stay on  pepcid at bedtime  and take 2  chlortrimeton at bedtime also to see if helps am congestion   For drainage take chlortrimeton (chlorpheniramine) 4 mg every 4 hours available over the counter (may cause drowsiness)   Start Pantoprazole (protonix) 40 mg   Take 30-60 min before first meal of the day   GERD (REFLUX)  is an extremely common cause of respiratory symptoms, many times with no significant heartburn at all.    It can be treated with medication, but also with lifestyle changes including avoidance of late meals, excessive alcohol, smoking cessation, and avoid fatty foods, chocolate, peppermint, colas, red wine, and acidic juices such as orange juice.  NO MINT OR MENTHOL PRODUCTS SO NO COUGH DROPS  USE SUGARLESS CANDY INSTEAD (jolley ranchers or Stover's)  NO OIL BASED VITAMINS - use powdered substitutes.    Please schedule a follow up office visit in 6 weeks, call sooner if needed

## 2014-03-04 NOTE — Assessment & Plan Note (Signed)
Changed coreg to bisoprolol 01/27/2014 due to concerns ? Asthma   Adequate control on present rx, reviewed > no change in rx needed  > keep on specific BB for now

## 2014-03-04 NOTE — Assessment & Plan Note (Addendum)
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 DDX of  difficult airways management all start with A and  include Adherence, Ace Inhibitors, Acid Reflux, Active Sinus Disease, Alpha 1 Antitripsin deficiency, Anxiety masquerading as Airways dz,  ABPA,  allergy(esp in young), Aspiration (esp in elderly), Adverse effects of DPI,  Active smokers, plus two Bs  = Bronchiectasis and Beta blocker use..and one C= CHF  ? Allergy/asthma > still possible though much less likley > try symbicort 80 2bid  ? Acid (or non-acid) GERD > always difficult to exclude as up to 75% of pts in some series report no assoc GI/ Heartburn symptoms> rec max (24h)  acid suppression and diet restrictions/ reviewed and instructions given in writing.

## 2014-03-08 LAB — PULMONARY FUNCTION TEST
DL/VA % pred: 154 %
DL/VA: 7.27 ml/min/mmHg/L
DLCO UNC % PRED: 78 %
DLCO UNC: 18.32 ml/min/mmHg
FEF 25-75 POST: 1.57 L/s
FEF 25-75 Pre: 1.73 L/sec
FEF2575-%Change-Post: -9 %
FEF2575-%PRED-POST: 64 %
FEF2575-%Pred-Pre: 70 %
FEV1-%Change-Post: -6 %
FEV1-%PRED-POST: 56 %
FEV1-%Pred-Pre: 60 %
FEV1-Post: 1.45 L
FEV1-Pre: 1.54 L
FEV1FVC-%Change-Post: 2 %
FEV1FVC-%Pred-Pre: 109 %
FEV6-%Change-Post: -8 %
FEV6-%PRED-POST: 51 %
FEV6-%Pred-Pre: 55 %
FEV6-POST: 1.63 L
FEV6-Pre: 1.78 L
FEV6FVC-%CHANGE-POST: 0 %
FEV6FVC-%PRED-POST: 103 %
FEV6FVC-%Pred-Pre: 102 %
FVC-%CHANGE-POST: -8 %
FVC-%PRED-PRE: 53 %
FVC-%Pred-Post: 49 %
FVC-Post: 1.63 L
FVC-Pre: 1.78 L
PRE FEV1/FVC RATIO: 87 %
PRE FEV6/FVC RATIO: 100 %
Post FEV1/FVC ratio: 89 %
Post FEV6/FVC ratio: 100 %
RV % PRED: 86 %
RV: 1.62 L
TLC % pred: 62 %
TLC: 3.1 L

## 2014-03-11 ENCOUNTER — Ambulatory Visit: Payer: Medicare Other | Admitting: Psychiatry

## 2014-03-30 ENCOUNTER — Encounter: Payer: Self-pay | Admitting: Physician Assistant

## 2014-03-30 ENCOUNTER — Ambulatory Visit (INDEPENDENT_AMBULATORY_CARE_PROVIDER_SITE_OTHER): Payer: Medicare Other | Admitting: Physician Assistant

## 2014-03-30 VITALS — BP 102/68 | HR 60 | Temp 98.4°F | Resp 16 | Ht 64.5 in | Wt 177.0 lb

## 2014-03-30 DIAGNOSIS — F3289 Other specified depressive episodes: Secondary | ICD-10-CM

## 2014-03-30 DIAGNOSIS — G8929 Other chronic pain: Secondary | ICD-10-CM

## 2014-03-30 DIAGNOSIS — F32A Depression, unspecified: Secondary | ICD-10-CM

## 2014-03-30 DIAGNOSIS — G4700 Insomnia, unspecified: Secondary | ICD-10-CM

## 2014-03-30 DIAGNOSIS — F329 Major depressive disorder, single episode, unspecified: Secondary | ICD-10-CM

## 2014-03-30 MED ORDER — ROPINIROLE HCL 2 MG PO TABS: 2.0000 mg | ORAL_TABLET | Freq: Every day | ORAL | Status: DC

## 2014-03-30 MED ORDER — SUVOREXANT 5 MG PO TABS: 5.0000 | ORAL_TABLET | Freq: Every day | ORAL | Status: DC

## 2014-03-30 NOTE — Patient Instructions (Signed)
Try Requip 2 mg try 1/2-1 at night for hot flashes/sleep/leg restlessness Can try belsomra samples If nothing helps then we can do Ativan 1 mg  Suggest seeing ortho, Dr. Connye Burkitt for alternative options
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
## 2014-04-06 ENCOUNTER — Other Ambulatory Visit: Payer: Self-pay | Admitting: Internal Medicine

## 2014-04-06 ENCOUNTER — Other Ambulatory Visit: Payer: Self-pay | Admitting: Physician Assistant

## 2014-04-06 MED ORDER — LORAZEPAM 2 MG PO TABS
ORAL_TABLET | ORAL | Status: DC
Start: 2014-04-06 — End: 2014-06-14

## 2014-04-07 ENCOUNTER — Encounter: Payer: Self-pay | Admitting: Physician Assistant

## 2014-04-13 ENCOUNTER — Ambulatory Visit: Payer: Self-pay | Admitting: Internal Medicine

## 2014-05-20 ENCOUNTER — Encounter: Payer: Self-pay | Admitting: Physician Assistant

## 2014-06-14 ENCOUNTER — Other Ambulatory Visit: Payer: Self-pay | Admitting: Physician Assistant

## 2014-06-14 MED ORDER — LORAZEPAM 2 MG PO TABS: ORAL_TABLET | ORAL | Status: DC

## 2014-06-23 ENCOUNTER — Encounter: Payer: Self-pay | Admitting: Physician Assistant

## 2014-07-14 ENCOUNTER — Other Ambulatory Visit: Payer: Self-pay

## 2014-07-14 DIAGNOSIS — Z853 Personal history of malignant neoplasm of breast: Secondary | ICD-10-CM

## 2014-07-15 ENCOUNTER — Other Ambulatory Visit (HOSPITAL_BASED_OUTPATIENT_CLINIC_OR_DEPARTMENT_OTHER): Payer: Medicare Other

## 2014-07-15 ENCOUNTER — Ambulatory Visit (HOSPITAL_COMMUNITY)
Admission: RE | Admit: 2014-07-15 | Discharge: 2014-07-15 | Disposition: A | Discharge status: Home / Self Care | Payer: Medicare Other | Source: Ambulatory Visit | Attending: Adult Health | Admitting: Adult Health

## 2014-07-15 DIAGNOSIS — Z853 Personal history of malignant neoplasm of breast: Secondary | ICD-10-CM

## 2014-07-15 DIAGNOSIS — R918 Other nonspecific abnormal finding of lung field: Secondary | ICD-10-CM | POA: Insufficient documentation

## 2014-07-15 LAB — CBC WITH DIFFERENTIAL/PLATELET
BASO%: 0.4 % (ref 0.0–2.0)
Basophils Absolute: 0 10*3/uL (ref 0.0–0.1)
EOS%: 0.3 % (ref 0.0–7.0)
Eosinophils Absolute: 0 10*3/uL (ref 0.0–0.5)
HCT: 44.3 % (ref 34.8–46.6)
HGB: 14.9 g/dL (ref 11.6–15.9)
LYMPH%: 20.5 % (ref 14.0–49.7)
MCH: 33.9 pg (ref 25.1–34.0)
MCHC: 33.7 g/dL (ref 31.5–36.0)
MCV: 100.7 fL (ref 79.5–101.0)
MONO#: 0.7 10*3/uL (ref 0.1–0.9)
MONO%: 8.8 % (ref 0.0–14.0)
NEUT#: 5.8 10*3/uL (ref 1.5–6.5)
NEUT%: 70 % (ref 38.4–76.8)
PLATELETS: 219 10*3/uL (ref 145–400)
RBC: 4.39 10*6/uL (ref 3.70–5.45)
RDW: 12.7 % (ref 11.2–14.5)
WBC: 8.3 10*3/uL (ref 3.9–10.3)
lymph#: 1.7 10*3/uL (ref 0.9–3.3)

## 2014-07-15 LAB — COMPREHENSIVE METABOLIC PANEL (CC13)
ALK PHOS: 66 U/L (ref 40–150)
ALT: 114 U/L — AB (ref 0–55)
AST: 107 U/L — ABNORMAL HIGH (ref 5–34)
Albumin: 4 g/dL (ref 3.5–5.0)
Anion Gap: 11 mEq/L (ref 3–11)
BILIRUBIN TOTAL: 0.58 mg/dL (ref 0.20–1.20)
BUN: 14.1 mg/dL (ref 7.0–26.0)
CO2: 25 mEq/L (ref 22–29)
Calcium: 9.5 mg/dL (ref 8.4–10.4)
Chloride: 101 mEq/L (ref 98–109)
Creatinine: 0.9 mg/dL (ref 0.6–1.1)
EGFR: 76 mL/min/{1.73_m2} — ABNORMAL LOW (ref >90)
Glucose: 110 mg/dl (ref 70–140)
POTASSIUM: 3.9 meq/L (ref 3.5–5.1)
SODIUM: 136 meq/L (ref 136–145)
TOTAL PROTEIN: 7.4 g/dL (ref 6.4–8.3)

## 2014-07-15 MED ORDER — IOHEXOL 300 MG/ML  SOLN
80.0000 mL | Freq: Once | INTRAMUSCULAR | Status: AC | PRN
  Administered 2014-07-15: 80 mL via INTRAVENOUS

## 2014-07-19 ENCOUNTER — Ambulatory Visit (HOSPITAL_BASED_OUTPATIENT_CLINIC_OR_DEPARTMENT_OTHER): Payer: Medicare Other | Admitting: Hematology and Oncology

## 2014-07-19 ENCOUNTER — Telehealth: Payer: Self-pay | Admitting: Hematology and Oncology

## 2014-07-19 VITALS — BP 122/80 | HR 70 | Temp 98.6°F | Resp 18 | Ht 64.5 in | Wt 186.6 lb

## 2014-07-19 DIAGNOSIS — Z853 Personal history of malignant neoplasm of breast: Secondary | ICD-10-CM

## 2014-07-19 DIAGNOSIS — R911 Solitary pulmonary nodule: Secondary | ICD-10-CM

## 2014-07-19 DIAGNOSIS — C50511 Malignant neoplasm of lower-outer quadrant of right female breast: Secondary | ICD-10-CM

## 2014-07-19 DIAGNOSIS — C44501 Unspecified malignant neoplasm of skin of breast: Secondary | ICD-10-CM

## 2014-07-19 DIAGNOSIS — Z170 Estrogen receptor positive status [ER+]: Secondary | ICD-10-CM

## 2014-07-19 DIAGNOSIS — R0602 Shortness of breath: Secondary | ICD-10-CM

## 2014-07-19 DIAGNOSIS — R062 Wheezing: Secondary | ICD-10-CM

## 2014-07-19 MED ORDER — ALBUTEROL SULFATE (2.5 MG/3ML) 0.083% IN NEBU
2.5000 mg | INHALATION_SOLUTION | Freq: Once | RESPIRATORY_TRACT | Status: AC
  Administered 2014-07-19: 2.5 mg via RESPIRATORY_TRACT
  Filled 2014-07-19: qty 3

## 2014-07-19 MED ORDER — ALBUTEROL SULFATE HFA 108 (90 BASE) MCG/ACT IN AERS: 2.0000 | INHALATION_SPRAY | Freq: Four times a day (QID) | RESPIRATORY_TRACT | Status: DC | PRN
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
## 2014-07-19 NOTE — Telephone Encounter (Signed)
per pof to sch pt appt-gave pt copy of sch °

## 2014-07-29 ENCOUNTER — Ambulatory Visit (HOSPITAL_COMMUNITY)
Admission: RE | Admit: 2014-07-29 | Discharge: 2014-07-29 | Disposition: A | Discharge status: Home / Self Care | Payer: Medicare Other | Source: Ambulatory Visit | Attending: Hematology and Oncology | Admitting: Hematology and Oncology

## 2014-07-29 ENCOUNTER — Encounter (HOSPITAL_COMMUNITY): Payer: Self-pay

## 2014-07-29 DIAGNOSIS — R911 Solitary pulmonary nodule: Secondary | ICD-10-CM | POA: Insufficient documentation

## 2014-07-29 DIAGNOSIS — C50919 Malignant neoplasm of unspecified site of unspecified female breast: Secondary | ICD-10-CM | POA: Diagnosis present

## 2014-07-29 DIAGNOSIS — Z853 Personal history of malignant neoplasm of breast: Secondary | ICD-10-CM

## 2014-07-29 LAB — GLUCOSE, CAPILLARY: Glucose-Capillary: 104 mg/dL — ABNORMAL HIGH (ref 70–99)

## 2014-07-29 MED ORDER — FLUDEOXYGLUCOSE F - 18 (FDG) INJECTION
7.8000 | Freq: Once | INTRAVENOUS | Status: AC | PRN
  Administered 2014-07-29: 7.8 via INTRAVENOUS

## 2014-08-02 ENCOUNTER — Telehealth: Payer: Self-pay | Admitting: Hematology and Oncology

## 2014-08-02 ENCOUNTER — Ambulatory Visit (HOSPITAL_BASED_OUTPATIENT_CLINIC_OR_DEPARTMENT_OTHER): Payer: Medicare Other | Admitting: Hematology and Oncology

## 2014-08-02 DIAGNOSIS — C44501 Unspecified malignant neoplasm of skin of breast: Secondary | ICD-10-CM

## 2014-08-02 DIAGNOSIS — C50511 Malignant neoplasm of lower-outer quadrant of right female breast: Secondary | ICD-10-CM
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
## 2014-08-02 NOTE — Telephone Encounter (Signed)
, °

## 2014-08-10 ENCOUNTER — Other Ambulatory Visit: Payer: Self-pay

## 2014-08-10 ENCOUNTER — Other Ambulatory Visit: Payer: Self-pay | Admitting: Internal Medicine

## 2014-08-10 ENCOUNTER — Other Ambulatory Visit: Payer: Self-pay | Admitting: Oncology

## 2014-08-10 ENCOUNTER — Telehealth: Payer: Self-pay | Admitting: Internal Medicine

## 2014-08-10 DIAGNOSIS — Z853 Personal history of malignant neoplasm of breast: Secondary | ICD-10-CM

## 2014-08-10 MED ORDER — LORAZEPAM 2 MG PO TABS: ORAL_TABLET | ORAL | Status: DC

## 2014-08-10 MED ORDER — BISOPROLOL FUMARATE 5 MG PO TABS: 5.0000 mg | ORAL_TABLET | Freq: Every day | ORAL | Status: DC

## 2014-08-10 MED ORDER — LETROZOLE 2.5 MG PO TABS: 2.5000 mg | ORAL_TABLET | Freq: Every day | ORAL | Status: DC

## 2014-08-10 NOTE — Telephone Encounter (Signed)
LMOVM - letrozole refill sent to [facility] - receipt confirmed.  Pt to call clinic if she has any questions.

## 2014-08-10 NOTE — Telephone Encounter (Signed)
Per MW- okay to refill # 90 and needs f/u before it runs out  Spoke with the pt and notified her of this She verbalized understanding  Rx was sent to pharm

## 2014-08-19 ENCOUNTER — Telehealth (HOSPITAL_COMMUNITY): Payer: Self-pay | Admitting: Vascular Surgery

## 2014-08-19 NOTE — Telephone Encounter (Signed)
Pt called she wants to get appointment with Linna Hoff she had a cath with him in the past... Can you check if he would see this pt again

## 2014-08-23 DIAGNOSIS — Z23 Encounter for immunization: Secondary | ICD-10-CM | POA: Diagnosis not present

## 2014-08-26 ENCOUNTER — Encounter (INDEPENDENT_AMBULATORY_CARE_PROVIDER_SITE_OTHER): Payer: Self-pay

## 2014-08-26 ENCOUNTER — Encounter: Payer: Self-pay | Admitting: Physician Assistant

## 2014-08-26 ENCOUNTER — Ambulatory Visit (INDEPENDENT_AMBULATORY_CARE_PROVIDER_SITE_OTHER): Payer: Medicare Other | Admitting: Physician Assistant

## 2014-08-26 VITALS — BP 120/68 | HR 56 | Temp 98.2°F | Resp 16 | Ht 64.5 in | Wt 186.0 lb

## 2014-08-26 DIAGNOSIS — E669 Obesity, unspecified: Secondary | ICD-10-CM

## 2014-08-26 DIAGNOSIS — R6889 Other general symptoms and signs: Secondary | ICD-10-CM

## 2014-08-26 DIAGNOSIS — K2100 Gastro-esophageal reflux disease with esophagitis, without bleeding: Secondary | ICD-10-CM

## 2014-08-26 DIAGNOSIS — F1010 Alcohol abuse, uncomplicated: Secondary | ICD-10-CM

## 2014-08-26 DIAGNOSIS — R7309 Other abnormal glucose: Secondary | ICD-10-CM

## 2014-08-26 DIAGNOSIS — E559 Vitamin D deficiency, unspecified: Secondary | ICD-10-CM | POA: Diagnosis not present

## 2014-08-26 DIAGNOSIS — Z79899 Other long term (current) drug therapy: Secondary | ICD-10-CM

## 2014-08-26 DIAGNOSIS — E663 Overweight: Secondary | ICD-10-CM | POA: Insufficient documentation

## 2014-08-26 DIAGNOSIS — D649 Anemia, unspecified: Secondary | ICD-10-CM

## 2014-08-26 DIAGNOSIS — K219 Gastro-esophageal reflux disease without esophagitis: Secondary | ICD-10-CM | POA: Insufficient documentation

## 2014-08-26 DIAGNOSIS — I10 Essential (primary) hypertension: Secondary | ICD-10-CM | POA: Diagnosis not present

## 2014-08-26 DIAGNOSIS — Z789 Other specified health status: Secondary | ICD-10-CM

## 2014-08-26 DIAGNOSIS — R059 Cough, unspecified: Secondary | ICD-10-CM

## 2014-08-26 DIAGNOSIS — E785 Hyperlipidemia, unspecified: Secondary | ICD-10-CM

## 2014-08-26 DIAGNOSIS — F1320 Sedative, hypnotic or anxiolytic dependence, uncomplicated: Secondary | ICD-10-CM | POA: Diagnosis not present

## 2014-08-26 DIAGNOSIS — Z0001 Encounter for general adult medical examination with abnormal findings: Secondary | ICD-10-CM

## 2014-08-26 DIAGNOSIS — I519 Heart disease, unspecified: Secondary | ICD-10-CM

## 2014-08-26 DIAGNOSIS — G2581 Restless legs syndrome: Secondary | ICD-10-CM | POA: Insufficient documentation

## 2014-08-26 DIAGNOSIS — I493 Ventricular premature depolarization: Secondary | ICD-10-CM

## 2014-08-26 DIAGNOSIS — R05 Cough: Secondary | ICD-10-CM

## 2014-08-26 DIAGNOSIS — R0602 Shortness of breath: Secondary | ICD-10-CM

## 2014-08-26 DIAGNOSIS — G4700 Insomnia, unspecified: Secondary | ICD-10-CM

## 2014-08-26 DIAGNOSIS — R918 Other nonspecific abnormal finding of lung field: Secondary | ICD-10-CM

## 2014-08-26 DIAGNOSIS — R7303 Prediabetes: Secondary | ICD-10-CM

## 2014-08-26 DIAGNOSIS — C50511 Malignant neoplasm of lower-outer quadrant of right female breast: Secondary | ICD-10-CM

## 2014-08-26 LAB — CBC WITH DIFFERENTIAL/PLATELET
BASOS PCT: 1 % (ref 0–1)
Basophils Absolute: 0.1 10*3/uL (ref 0.0–0.1)
Eosinophils Absolute: 0.1 10*3/uL (ref 0.0–0.7)
Eosinophils Relative: 1 % (ref 0–5)
HEMATOCRIT: 43.9 % (ref 36.0–46.0)
HEMOGLOBIN: 15.1 g/dL — AB (ref 12.0–15.0)
Lymphocytes Relative: 35 % (ref 12–46)
Lymphs Abs: 2.3 10*3/uL (ref 0.7–4.0)
MCH: 34.1 pg — AB (ref 26.0–34.0)
MCHC: 34.4 g/dL (ref 30.0–36.0)
MCV: 99.1 fL (ref 78.0–100.0)
MONO ABS: 0.9 10*3/uL (ref 0.1–1.0)
MPV: 9.7 fL (ref 8.6–12.4)
Monocytes Relative: 14 % — ABNORMAL HIGH (ref 3–12)
NEUTROS ABS: 3.3 10*3/uL (ref 1.7–7.7)
Neutrophils Relative %: 49 % (ref 43–77)
PLATELETS: 320 10*3/uL (ref 150–400)
RBC: 4.43 MIL/uL (ref 3.87–5.11)
RDW: 13.3 % (ref 11.5–15.5)
WBC: 6.7 10*3/uL (ref 4.0–10.5)

## 2014-08-26 MED ORDER — GABAPENTIN 300 MG PO CAPS: ORAL_CAPSULE | ORAL | Status: DC

## 2014-08-26 NOTE — Progress Notes (Signed)
MEDICARE ANNUAL WELLNESS VISIT AND CPE  Assessment:   1. Essential hypertension - CBC with Differential - BASIC METABOLIC PANEL WITH GFR - Hepatic function panel - TSH - Urinalysis, Routine w reflex microscopic - Microalbumin / creatinine urine ratio  2. Benzodiazepine dependence Will monitor very closely  3. Alcohol abuse Long discussion about alcohol use, risk such as liver failure, heart failure, cancer.   4. Breast cancer of lower-outer quadrant of right female breast Continue follow up  5. Prediabetes Discussed general issues about diabetes pathophysiology and management., Educational material distributed., Suggested low cholesterol diet., Encouraged aerobic exercise., Discussed foot care., Reminded to get yearly retinal exam. - Hemoglobin A1c - Insulin, fasting - HM DIABETES FOOT EXAM  6. Systolic dysfunction Improved since stopped drinking versus chemo- continue follow up with cardio Control blood pressure, cholesterol, glucose, increase exercise.   7. PVC (premature ventricular contraction) Continue Ziac  8. Gastroesophageal reflux disease with esophagitis Continue PPI/H2 blocker, diet discussed  9. Cough Continue follow up Dr. Melvyn Novas, likely mutlifactorial  10. DYSPNEA Continue follow up Dr. Melvyn Novas, weight loss advised, continue GERD meds  11. Obesity Obesity with co morbidities- long discussion about weight loss, diet, and exercise  12. Lung nodules Continue follow up  13. Insomnia Will refill ativan, patient understands that we will be monitoring very closely.   14. RLS (restless legs syndrome) - gabapentin (NEURONTIN) 300 MG capsule; 1-2 at night for sleep/pain  Dispense: 60 capsule; Refill: 2  15. Hyperlipidemia -continue medications, check lipids, decrease fatty foods, increase activity.  - Lipid panel  16. Vitamin D deficiency - Vit D  25 hydroxy (rtn osteoporosis monitoring)  17. Anemia, unspecified anemia type - Iron and TIBC -  Ferritin  18. Medication management - Magnesium   Plan:   During the course of the visit the patient was educated and counseled about appropriate screening and preventive services including:    Pneumococcal vaccine   Influenza vaccine  Td vaccine  Screening electrocardiogram  Bone densitometry screening  Colorectal cancer screening  Diabetes screening  Glaucoma screening  Nutrition counseling   Advanced directives: requested  Screening recommendations, referrals: Vaccinations: Please see documentation below and orders this visit.  Nutrition assessed and recommended  Colonoscopy up to date Recommended yearly ophthalmology/optometry visit for glaucoma screening and checkup Recommended yearly dental visit for hygiene and checkup Advanced directives - requested  Conditions/risks identified: BMI: Discussed weight loss, diet, and increase physical activity.  Increase physical activity: AHA recommends 150 minutes of physical activity a week.  Medications reviewed Diabetes is at goal, ACE/ARB therapy: No, Reason not on Ace Inhibitor/ARB therapy:  prediabetes Urinary Incontinence is not an issue: discussed non pharmacology and pharmacology options.  Fall risk: low- discussed PT, home fall assessment, medications.    Subjective:  Suzanne [name] is a 59 y.o. female who presents for Medicare Annual Wellness Visit and complete physical.  Date of last medicare wellness visit is unknown.   Her blood pressure has been controlled at home, today their BP is BP: 120/68 mmHg She does workout. She denies chest pain, shortness of breath, dizziness.  She is not on cholesterol medication and denies myalgias. Her cholesterol is not at goal. The cholesterol last visit was: LDL 133.   MCV was 103.5 last visit. And her last AST/ALT was 282 and 221. She does have a history of alcoholism, she states that she has not been drinking since Jan. She states that her husband is not supportive and does  not understand that alcohol is  an addition for her.  Lab Results  Component Value Date   ALT 114* 07/15/2014   AST 107* 07/15/2014   ALKPHOS 66 07/15/2014   BILITOT 0.58 07/15/2014   She has been having a cough, has seen Dr. Melvyn Novas and from PFTs he is thinking it is from GERD, she is on albuterol and symbicort but does not have asthma/COPD history.  She has GERD and is on protonix.  She is on ativan 2mg , takes 1/2  for sleep/RLS, does not take it with alcohol, states it helps. She was on neurotin in the past which helped, she would like a refill to try to use rather than the gabapentin. She could not tolerate the Requip.   She has a history of right breast cancer, still on femara and is now following with Dr. Lindi Adie. Has new nodules in her lungs, she had negative PET scan and is getting another scan in Feb with possible biopsy.   She has been working on diet and exercise for prediabetes, and denies paresthesia of the feet, polydipsia, polyuria and visual disturbances. Last A1C in the office was: 5.7 Patient is on Vitamin D supplement.   Lab Results  Component Value Date   VD25OH 45 12/19/2012  BMI is Body mass index is 31.45 kg/(m^2)., she is working on diet and exercise. Wt Readings from Last 3 Encounters:  08/26/14 186 lb (84.369 kg)  07/19/14 186 lb 9.6 oz (84.641 kg)  03/30/14 177 lb (80.287 kg)   Names of Other Physician/Practitioners you currently use: 1. [facility] here for primary care 2. Dr. Gershon Crane, eye doctor, last visit 01/2014 3. Dr. Carman Ching, dentist, last visit 08/2014 Patient Care Team: Unk Pinto, MD as PCP - General (Internal Medicine) Cleotis Nipper, MD as Consulting Physician (Gastroenterology) Tanda Rockers, MD as Consulting Physician (Pulmonary Disease) Rulon Eisenmenger, MD as Consulting Physician (Hematology and Oncology) Jolaine Artist, MD as Consulting Physician (Cardiology)   Medication Review: Current  Outpatient Prescriptions on File Prior to Visit  Medication Sig Dispense Refill  . albuterol (PROVENTIL HFA;VENTOLIN HFA) 108 (90 BASE) MCG/ACT inhaler Inhale 2 puffs into the lungs every 6 (six) hours as needed for wheezing or shortness of breath. 1 Inhaler 2  . bisoprolol (ZEBETA) 5 MG tablet Take 1 tablet (5 mg total) by mouth daily. 90 tablet 0  . budesonide-formoterol (SYMBICORT) 80-4.5 MCG/ACT inhaler Take 2 puffs first thing in am and then another 2 puffs about 12 hours later. 1 Inhaler 12  . famotidine (PEPCID) 20 MG tablet Take 20 mg by mouth at bedtime.    Marland Kitchen letrozole (FEMARA) 2.5 MG tablet Take 1 tablet (2.5 mg total) by mouth daily. 90 tablet 3  . LORazepam (ATIVAN) 2 MG tablet 1/2-1 tablet PRN for sleep at night 30 tablet 0  . pantoprazole (PROTONIX) 40 MG tablet Take 1 tablet (40 mg total) by mouth daily. Take 30-60 min before first meal of the day 30 tablet 2   No current facility-administered medications on file prior to visit.    Current Problems (verified) Patient Active Problem List   Diagnosis Date Noted  . Prediabetes 08/26/2014  . Systolic dysfunction 62/10/5595  . GERD (gastroesophageal reflux disease) 08/26/2014  . Obesity 08/26/2014  . Lung nodules 08/26/2014  . Insomnia 08/26/2014  . RLS (restless legs syndrome) 08/26/2014  . Breast cancer of lower-outer quadrant of right female breast 07/15/2012  . Hypertension   . Alcohol abuse 05/24/2012  . Benzodiazepine dependence 05/24/2012  Class: Chronic  . PVC (premature ventricular contraction) 11/29/2009  . COUGH 07/05/2008  . DYSPNEA 08/15/2007    Screening Tests Health Maintenance  Topic Date Due  . PAP SMEAR  07/14/1975  . COLONOSCOPY  07/14/2007  . INFLUENZA VACCINE  03/13/2014  . MAMMOGRAM  03/29/2016  . TETANUS/TDAP  05/14/2022    Immunization History  Administered Date(s) Administered  . Influenza Split 05/28/2013  . Influenza Whole 08/21/2007  . Pneumococcal Conjugate-13 08/23/2014  .  Pneumococcal Polysaccharide-23 08/21/2007  . Td 05/14/2012  . Zoster 05/16/2011   Preventative care: Last colonoscopy: 2012 due 10 years Last mammogram: 03/2014 Last pap smear/pelvic exam: remote declines another  DEXA 12/2012 due 12/2015  PFT 02/2014 CT chest 07/2014 LL nodule increasing in size BUT PET 07/2014 negative- suggest continued CT  Echo 05/2012 50-55%  Prior vaccinations: TD or Tdap: 2013  Influenza: 2015  Pneumococcal: 2009 Prevnar13: 2016 Shingles/Zostavax: 2012    Medication List       This list is accurate as of: 08/26/14  5:37 PM.  Always use your most recent med list.               albuterol 108 (90 BASE) MCG/ACT inhaler  Commonly known as:  PROVENTIL HFA;VENTOLIN HFA  Inhale 2 puffs into the lungs every 6 (six) hours as needed for wheezing or shortness of breath.     bisoprolol 5 MG tablet  Commonly known as:  ZEBETA  Take 1 tablet (5 mg total) by mouth daily.     budesonide-formoterol 80-4.5 MCG/ACT inhaler  Commonly known as:  SYMBICORT  Take 2 puffs first thing in am and then another 2 puffs about 12 hours later.     famotidine 20 MG tablet  Commonly known as:  PEPCID  Take 20 mg by mouth at bedtime.     gabapentin 300 MG capsule  Commonly known as:  NEURONTIN  1-2 at night for sleep/pain     letrozole 2.5 MG tablet  Commonly known as:  FEMARA  Take 1 tablet (2.5 mg total) by mouth daily.     LORazepam 2 MG tablet  Commonly known as:  ATIVAN  1/2-1 tablet PRN for sleep at night     pantoprazole 40 MG tablet  Commonly known as:  PROTONIX  Take 1 tablet (40 mg total) by mouth daily. Take 30-60 min before first meal of the day        Past Surgical History  Procedure Laterality Date  . R transflap  '94  . R masectomy  '94   . Mastectomy    . Orif distal radius fracture    . Cardiac catheterization      [facility]  . Port-a-cath removal  07/29/2012    Procedure: REMOVAL PORT-A-CATH;  Surgeon: Haywood Lasso, MD;   Location: [facility];  Service: General;  Laterality: Left;   Family History  Problem Relation Age of Onset  . Coronary artery disease      family hx  . Hyperlipidemia      family hx  . Thyroid disease      family hx   . Hypertension Mother   . Alcohol abuse Paternal Grandfather   . Alcohol abuse Paternal Uncle   . Heart attack Father   . Alcohol abuse Father   . Cancer Paternal Grandmother     breast  . Alcohol abuse Brother     History reviewed: allergies, current medications, past family history, past medical history, past social history, past surgical  history and problem list   Risk Factors: Osteoporosis/FallRisk: postmenopausal estrogen deficiency and dietary calcium and/or vitamin D deficiency In the past year have you fallen or had a near fall?:No History of fracture in the past year: no  Tobacco History  Substance Use Topics  . Smoking status: Never Smoker   . Smokeless tobacco: Never Used     Comment: just socially   . Alcohol Use: No     Comment: No alcohol since 05/22/2012   She does not smoke.  Patient is not a former smoker. Are there smokers in your home (other than you)?  No  Alcohol Current alcohol use: none- history of alcohol abuse  Caffeine Current caffeine use: coffee 3 /day  Exercise Current exercise: walking  Nutrition/Diet Current diet: in general, a "healthy" diet    Cardiac risk factors: advanced age (older than 69 for men, 20 for women), dyslipidemia and hypertension.  Depression Screen (Note: if answer to either of the following is "Yes", a more complete depression screening is indicated)   Q1: Over the past two weeks, have you felt down, depressed or hopeless? No  Q2: Over the past two weeks, have you felt little interest or pleasure in doing things? No  Have you lost interest or pleasure in daily life? No  Do you often feel hopeless? No  Do you cry easily over simple problems? No  Activities of Daily Living In  your present state of health, do you have any difficulty performing the following activities?:  Driving? No Managing money?  No Feeding yourself? No Getting from bed to chair? No Climbing a flight of stairs? No Preparing food and eating?: No Bathing or showering? No Getting dressed: No Getting to the toilet? No Using the toilet:No Moving around from place to place: No In the past year have you fallen or had a near fall?:No   Are you sexually active?  Yes  Do you have more than one partner?  No  Vision Difficulties: No  Hearing Difficulties: No Do you often ask people to speak up or repeat themselves? No Do you experience ringing or noises in your ears? No Do you have difficulty understanding soft or whispered voices? No  Cognition  Do you feel that you have a problem with memory?Yes  Do you often misplace items? No  Do you feel safe at home?  Yes  Advanced directives Does patient have a [place]? Yes Does patient have a Living Will? Yes   Objective:     Blood pressure 120/68, pulse 56, temperature 98.2 F (36.8 C), resp. rate 16, height 5' 4.5" (1.638 m), weight 186 lb (84.369 kg). Body mass index is 31.45 kg/(m^2).  General appearance: alert, no distress, WD/WN, female Cognitive Testing  Alert? Yes  Normal Appearance?Yes  Oriented to person? Yes  Place? Yes   Time? Yes  Recall of three objects?  Yes  Can perform simple calculations? Yes  Displays appropriate judgment?Yes  Can read the correct time from a watch face?Yes  HEENT: normocephalic, sclerae anicteric, TMs pearly, nares patent, no discharge or erythema, pharynx normal Oral cavity: MMM, no lesions Neck: supple, no lymphadenopathy, no thyromegaly, no masses Heart: RRR, normal S1, S2, no murmurs Lungs: CTA bilaterally, no wheezes, rhonchi, or rales Abdomen: +bs, soft, + epigastric tender, non distended, no masses, no hepatomegaly, no splenomegaly Musculoskeletal: nontender, no  swelling, no obvious deformity Extremities: no edema, no cyanosis, no clubbing Pulses: 2+ symmetric, upper and lower extremities, normal cap refill Neurological:  alert, oriented x 3, CN2-12 intact, strength normal upper extremities and lower extremities, sensation normal throughout, DTRs 2+ throughout, no cerebellar signs, gait normal Psychiatric: normal affect, behavior normal, pleasant, denies SI/HI  Medicare Attestation I have personally reviewed: The patient's medical and social history Their use of alcohol, tobacco or illicit drugs Their current medications and supplements The patient's functional ability including ADLs,fall risks, home safety risks, cognitive, and hearing and visual impairment Diet and physical activities Evidence for depression or mood disorders  The patient's weight, height, BMI, and visual acuity have been recorded in the chart.  I have made referrals, counseling, and provided education to the patient based on review of the above and I have provided the patient with a written personalized care plan for preventive services.     Vicie Mutters, PA-C   08/26/2014

## 2014-08-27 LAB — HEPATIC FUNCTION PANEL
ALT: 66 U/L — ABNORMAL HIGH (ref 0–35)
AST: 46 U/L — ABNORMAL HIGH (ref 0–37)
Albumin: 4.3 g/dL (ref 3.5–5.2)
Alkaline Phosphatase: 61 U/L (ref 39–117)
Bilirubin, Direct: 0.1 mg/dL (ref 0.0–0.3)
Indirect Bilirubin: 0.3 mg/dL (ref 0.2–1.2)
Total Bilirubin: 0.4 mg/dL (ref 0.2–1.2)
Total Protein: 7.2 g/dL (ref 6.0–8.3)

## 2014-08-27 LAB — URINALYSIS, MICROSCOPIC ONLY
BACTERIA UA: NONE SEEN
CRYSTALS: NONE SEEN
Casts: NONE SEEN
Squamous Epithelial / LPF: NONE SEEN

## 2014-08-27 LAB — BASIC METABOLIC PANEL WITH GFR
BUN: 12 mg/dL (ref 6–23)
CO2: 28 meq/L (ref 19–32)
Calcium: 9.4 mg/dL (ref 8.4–10.5)
Chloride: 101 mEq/L (ref 96–112)
Creat: 0.76 mg/dL (ref 0.50–1.10)
GFR, EST NON AFRICAN AMERICAN: 87 mL/min
GFR, Est African American: >89 mL/min
GLUCOSE: 74 mg/dL (ref 70–99)
Potassium: 4.1 mEq/L (ref 3.5–5.3)
SODIUM: 140 meq/L (ref 135–145)

## 2014-08-27 LAB — LIPID PANEL
CHOL/HDL RATIO: 3.2 ratio
Cholesterol: 177 mg/dL (ref 0–200)
HDL: 56 mg/dL (ref >39)
LDL CALC: 98 mg/dL (ref 0–99)
Triglycerides: 114 mg/dL (ref <150)
VLDL: 23 mg/dL (ref 0–40)

## 2014-08-27 LAB — IRON AND TIBC
%SAT: 28 % (ref 20–55)
Iron: 111 ug/dL (ref 42–145)
TIBC: 390 ug/dL (ref 250–470)
UIBC: 279 ug/dL (ref 125–400)

## 2014-08-27 LAB — URINALYSIS, ROUTINE W REFLEX MICROSCOPIC
Bilirubin Urine: NEGATIVE
Glucose, UA: NEGATIVE mg/dL
Hgb urine dipstick: NEGATIVE
Ketones, ur: NEGATIVE mg/dL
Nitrite: NEGATIVE
PH: 6 (ref 5.0–8.0)
PROTEIN: NEGATIVE mg/dL
Specific Gravity, Urine: 1.011 (ref 1.005–1.030)
UROBILINOGEN UA: 0.2 mg/dL (ref 0.0–1.0)

## 2014-08-27 LAB — HEMOGLOBIN A1C
Hgb A1c MFr Bld: 5.9 % — ABNORMAL HIGH (ref <5.7)
Mean Plasma Glucose: 123 mg/dL — ABNORMAL HIGH (ref <117)

## 2014-08-27 LAB — MICROALBUMIN / CREATININE URINE RATIO
CREATININE, URINE: 65.1 mg/dL
Microalb Creat Ratio: 4.6 mg/g (ref 0.0–30.0)
Microalb, Ur: 0.3 mg/dL (ref <2.0)

## 2014-08-27 LAB — MAGNESIUM: Magnesium: 1.9 mg/dL (ref 1.5–2.5)

## 2014-08-27 LAB — TSH: TSH: 0.769 u[IU]/mL (ref 0.350–4.500)

## 2014-08-27 LAB — VITAMIN D 25 HYDROXY (VIT D DEFICIENCY, FRACTURES): Vit D, 25-Hydroxy: 34 ng/mL (ref 30–100)

## 2014-08-27 LAB — FERRITIN: FERRITIN: 224 ng/mL (ref 10–291)

## 2014-08-27 LAB — INSULIN, FASTING: Insulin fasting, serum: 18.1 u[IU]/mL (ref 2.0–19.6)

## 2014-09-13 ENCOUNTER — Encounter (INDEPENDENT_AMBULATORY_CARE_PROVIDER_SITE_OTHER): Payer: Self-pay

## 2014-09-13 ENCOUNTER — Ambulatory Visit (INDEPENDENT_AMBULATORY_CARE_PROVIDER_SITE_OTHER): Payer: Medicare Other | Admitting: Cardiovascular Disease

## 2014-09-13 ENCOUNTER — Encounter: Payer: Self-pay | Admitting: Cardiovascular Disease

## 2014-09-13 VITALS — BP 100/78 | HR 77 | Ht 65.0 in | Wt 185.5 lb

## 2014-09-13 DIAGNOSIS — I519 Heart disease, unspecified: Secondary | ICD-10-CM

## 2014-09-13 DIAGNOSIS — R0602 Shortness of breath: Secondary | ICD-10-CM

## 2014-09-13 NOTE — Progress Notes (Signed)
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 She was seen by Dr. Haroldine Laws 2011. She was found to have breast CA in 94. Underwent R mastectomy in 1994 with flap reconstructiona and chemotherapy. In 2007 found to have local recurrence treated with XRT and chemo. Developed dyspnea in 2007 and found to have weakened R hemidiaphragm of unclear cause.  Cath 6/11: EF 50-55% Left main was normal. Normal cors EF 50-55%  She has known history of alcohol and benzodiazepine abuse but attended successful inpatient detox program.  Echocardiogram in October of 2013 showed an ejection fraction of 50-55%. Last year, she was able to lose 40 pounds. She was taken off Diovan due to low blood pressure. She was later switched from carvedilol to bisoprolol by pulmonary due to worsening dyspnea and concerns about possible underlying asthma. Unfortunately, she started drinking alcohol again around Thanksgiving but quit after New Year's. She reports significant dyspnea but no chest pain.  Allergies  Allergen Reactions  . Ace Inhibitors     cough     Current Outpatient Prescriptions on File Prior to Visit  Medication Sig Dispense Refill  . albuterol (PROVENTIL HFA;VENTOLIN HFA) 108 (90 BASE) MCG/ACT inhaler Inhale 2 puffs into the lungs every 6 (six) hours as needed for wheezing or shortness of breath. 1 Inhaler 2  . bisoprolol (ZEBETA) 5 MG tablet Take 1 tablet (5 mg total) by mouth daily. 90 tablet 0  . budesonide-formoterol (SYMBICORT) 80-4.5 MCG/ACT inhaler Take 2 puffs first thing in am and then another 2 puffs about 12 hours later. 1 Inhaler 12  . famotidine (PEPCID) 20 MG tablet Take 20 mg by mouth at bedtime.    . gabapentin (NEURONTIN) 300 MG capsule 1-2 at night for sleep/pain 60 capsule 2  . letrozole (FEMARA) 2.5 MG tablet Take 1 tablet (2.5 mg total) by mouth daily. 90 tablet 3    . LORazepam (ATIVAN) 2 MG tablet 1/2-1 tablet PRN for sleep at night 30 tablet 0  . pantoprazole (PROTONIX) 40 MG tablet Take 1 tablet (40 mg total) by mouth daily. Take 30-60 min before first meal of the day 30 tablet 2   No current facility-administered medications on file prior to visit.     Past Medical History  Diagnosis Date  . Dyspnea     myoview 2011: EF 55% question of  mild reverible anterior defect. thought to be breast attenuation. echo 45-50% with global HK. Grade 1 diastolyic dysfunction. RV nml. cardiac MRI with EF 44% with septal HK. no scar   . Cancer     breast ca - 1994; recurred in 2007. s/p masectomy with flap rconstruction aand chemo '94. local recurrence on chest wall. chemo and XRT '07. now femara  . Lung disorder   . History of coronary artery stent placement   . Hyperlipidemia   . Hypertension   . Insomnia   . Alcoholism   . Depression      Past Surgical History  Procedure Laterality Date  . R transflap  '94  . R masectomy  '94   . Mastectomy    . Orif distal radius fracture    . Cardiac catheterization      [facility]  . Port-a-cath removal  07/29/2012    Procedure: REMOVAL PORT-A-CATH;  Surgeon: Haywood Lasso, MD;  Location: [facility];  Service: General;  Laterality: Left;     Family History  Problem Relation Age of Onset  . Coronary artery disease      family hx  . Hyperlipidemia      family hx  . Thyroid disease      family hx   . Hypertension Mother   . Alcohol abuse Paternal Grandfather   . Alcohol abuse Paternal Uncle   . Heart attack Father   . Alcohol abuse Father   . Cancer Paternal Grandmother     breast  . Alcohol abuse Brother      History   Social History  . Marital Status: Married    Spouse Name: N/A    Number of Children: N/A  . Years of Education: N/A   Occupational History  . Disabilty    Social History Main Topics  . Smoking status: Never Smoker   . Smokeless tobacco: Never Used      Comment: just socially   . Alcohol Use: No     Comment: No alcohol since 05/22/2012  . Drug Use: No  . Sexual Activity: Yes    Birth Control/ Protection: Surgical   Other Topics Concern  . Not on file   Social History Narrative   Suzanne [name] was born in [place] and lived there until high school, when she moved to [facility]. She graduated from the [employer] with a bachelor of science in nursing. 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 She has been married for 27 years, and has 2 children, a 37 year old son, and a 29 year old daughter. She denies any legal difficulties. She associates as a Engineer, manufacturing.       PHYSICAL EXAM   BP 100/78 mmHg  Pulse 77  Ht 5\' 5"  (1.651 m)  Wt 185 lb 8 oz (84.142 kg)  BMI 30.87 kg/m2  Constitutional: She is oriented to person, place, and time. She appears well-developed and well-nourished. No distress.  HENT: No nasal discharge.  Head: Normocephalic and atraumatic.  Eyes: Pupils are equal and round. Right eye exhibits no discharge. Left eye exhibits no discharge.  Neck: Normal range of motion. Neck supple. No JVD present. No thyromegaly present.  Cardiovascular: Normal rate, regular rhythm, normal heart sounds. Exam reveals no gallop and no friction rub. No murmur heard.  Pulmonary/Chest: Effort normal and breath sounds normal. No stridor. No respiratory distress. She has no wheezes. She has no rales. She exhibits no tenderness.  Abdominal: Soft. Bowel sounds are normal. She exhibits no distension. There is no tenderness. There is no rebound and no guarding.  Musculoskeletal: Normal range of motion. She exhibits no edema and no tenderness.  Neurological: She is alert and oriented to person, place, and time. Coordination normal.  Skin: Skin is warm and dry. No rash noted. She is not diaphoretic. No erythema. No pallor.  Psychiatric: She has a normal mood and  affect. Her behavior is normal. Judgment and thought content normal.    EKG: Sinus  Rhythm  Low voltage in precordial leads.   -Poor R-wave progression in the anterior leads  ABNORMAL   ASSESSMENT AND PLAN

## 2014-09-13 NOTE — Patient Instructions (Signed)

## 2014-09-16 ENCOUNTER — Other Ambulatory Visit (INDEPENDENT_AMBULATORY_CARE_PROVIDER_SITE_OTHER): Payer: Medicare Other

## 2014-09-16 ENCOUNTER — Other Ambulatory Visit: Payer: Self-pay | Admitting: Physician Assistant

## 2014-09-16 ENCOUNTER — Other Ambulatory Visit: Payer: Self-pay

## 2014-09-16 DIAGNOSIS — I493 Ventricular premature depolarization: Secondary | ICD-10-CM

## 2014-09-16 DIAGNOSIS — R0602 Shortness of breath: Secondary | ICD-10-CM | POA: Diagnosis not present

## 2014-09-16 MED ORDER — LORAZEPAM 2 MG PO TABS: ORAL_TABLET | ORAL | Status: DC

## 2014-09-19 NOTE — Assessment & Plan Note (Signed)
Given worsening symptoms of shortness of breath, I requested an echocardiogram to reevaluate her LV systolic function. She does not appear to be volume overloaded.

## 2014-10-04 ENCOUNTER — Ambulatory Visit (HOSPITAL_COMMUNITY)
Admission: RE | Admit: 2014-10-04 | Discharge: 2014-10-04 | Disposition: A | Discharge status: Home / Self Care | Payer: Medicare Other | Source: Ambulatory Visit | Attending: Hematology and Oncology | Admitting: Hematology and Oncology

## 2014-10-04 ENCOUNTER — Encounter (HOSPITAL_COMMUNITY): Payer: Self-pay

## 2014-10-04 DIAGNOSIS — J948 Other specified pleural conditions: Secondary | ICD-10-CM | POA: Diagnosis not present

## 2014-10-04 DIAGNOSIS — C50511 Malignant neoplasm of lower-outer quadrant of right female breast: Secondary | ICD-10-CM | POA: Diagnosis not present

## 2014-10-04 DIAGNOSIS — Z923 Personal history of irradiation: Secondary | ICD-10-CM | POA: Insufficient documentation

## 2014-10-04 DIAGNOSIS — Z9221 Personal history of antineoplastic chemotherapy: Secondary | ICD-10-CM | POA: Insufficient documentation

## 2014-10-04 DIAGNOSIS — R05 Cough: Secondary | ICD-10-CM | POA: Insufficient documentation

## 2014-10-04 DIAGNOSIS — R911 Solitary pulmonary nodule: Secondary | ICD-10-CM | POA: Diagnosis not present

## 2014-10-04 MED ORDER — IOHEXOL 300 MG/ML  SOLN
80.0000 mL | Freq: Once | INTRAMUSCULAR | Status: AC | PRN
  Administered 2014-10-04: 80 mL via INTRAVENOUS

## 2014-10-06 ENCOUNTER — Telehealth: Payer: Self-pay | Admitting: Hematology and Oncology

## 2014-10-06 ENCOUNTER — Ambulatory Visit (HOSPITAL_BASED_OUTPATIENT_CLINIC_OR_DEPARTMENT_OTHER): Payer: Medicare Other | Admitting: Hematology and Oncology

## 2014-10-06 DIAGNOSIS — C44501 Unspecified malignant neoplasm of skin of breast: Secondary | ICD-10-CM

## 2014-10-06 DIAGNOSIS — C50511 Malignant neoplasm of lower-outer quadrant of right female breast: Secondary | ICD-10-CM

## 2014-10-06 DIAGNOSIS — R911 Solitary pulmonary nodule: Secondary | ICD-10-CM

## 2014-10-06 DIAGNOSIS — R062 Wheezing: Secondary | ICD-10-CM
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
## 2014-10-06 NOTE — Telephone Encounter (Signed)
appts made and avs printed for pt  Suzanne °

## 2014-11-16 ENCOUNTER — Telehealth: Payer: Self-pay | Admitting: *Deleted

## 2014-11-16 ENCOUNTER — Other Ambulatory Visit: Payer: Self-pay | Admitting: Hematology and Oncology

## 2014-11-16 NOTE — Telephone Encounter (Signed)
Yes, that is fine. 

## 2014-11-16 NOTE — Telephone Encounter (Signed)
Last ov 10/06/14.  Next ov 04/06/15.  Chart reviewed.

## 2014-11-16 NOTE — Telephone Encounter (Signed)
Pt requesting refill on medication listed below; Ok to refill?

## 2014-11-16 NOTE — Telephone Encounter (Signed)
Pt calling stating that her pulmonologist would not be able to prescribe medication below that we would need to start refilling it.   Generic Zebeta  5 mg  [facility] on [address]  90 day   Please call when sent.

## 2014-11-17 ENCOUNTER — Other Ambulatory Visit: Payer: Self-pay | Admitting: *Deleted

## 2014-11-17 MED ORDER — BISOPROLOL FUMARATE 5 MG PO TABS: 5.0000 mg | ORAL_TABLET | Freq: Every day | ORAL | Status: DC

## 2014-11-17 NOTE — Telephone Encounter (Signed)
Rx sent for Zebeta to local pharmacy.

## 2014-11-18 ENCOUNTER — Other Ambulatory Visit: Payer: Self-pay

## 2014-11-18 MED ORDER — LORAZEPAM 2 MG PO TABS: ORAL_TABLET | ORAL | Status: DC
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
## 2015-01-26 ENCOUNTER — Other Ambulatory Visit: Payer: Self-pay

## 2015-01-26 MED ORDER — LORAZEPAM 2 MG PO TABS: ORAL_TABLET | ORAL | Status: DC

## 2015-02-03 ENCOUNTER — Ambulatory Visit (INDEPENDENT_AMBULATORY_CARE_PROVIDER_SITE_OTHER)
Admission: RE | Admit: 2015-02-03 | Discharge: 2015-02-03 | Disposition: A | Discharge status: Home / Self Care | Payer: Medicare Other | Source: Ambulatory Visit | Attending: Adult Health | Admitting: Adult Health

## 2015-02-03 ENCOUNTER — Ambulatory Visit (INDEPENDENT_AMBULATORY_CARE_PROVIDER_SITE_OTHER): Payer: Medicare Other | Admitting: Adult Health

## 2015-02-03 ENCOUNTER — Encounter: Payer: Self-pay | Admitting: Adult Health

## 2015-02-03 VITALS — BP 104/80 | HR 67 | Temp 97.8°F | Ht 65.0 in | Wt 173.0 lb

## 2015-02-03 DIAGNOSIS — R918 Other nonspecific abnormal finding of lung field: Secondary | ICD-10-CM | POA: Diagnosis not present

## 2015-02-03 DIAGNOSIS — R05 Cough: Secondary | ICD-10-CM

## 2015-02-03 DIAGNOSIS — R059 Cough, unspecified: Secondary | ICD-10-CM

## 2015-02-03 DIAGNOSIS — J209 Acute bronchitis, unspecified: Secondary | ICD-10-CM

## 2015-02-03 MED ORDER — LEVOFLOXACIN 500 MG PO TABS: 500.0000 mg | ORAL_TABLET | Freq: Every day | ORAL | Status: DC

## 2015-02-03 MED ORDER — LEVALBUTEROL HCL 0.63 MG/3ML IN NEBU
0.6300 mg | INHALATION_SOLUTION | Freq: Once | RESPIRATORY_TRACT | Status: AC
  Administered 2015-02-03: 0.63 mg via RESPIRATORY_TRACT

## 2015-02-03 MED ORDER — PREDNISONE 10 MG PO TABS: ORAL_TABLET | ORAL | Status: DC

## 2015-02-03 NOTE — Patient Instructions (Signed)
Levaquin 500 mg daily for 7 days, take with food Prednisone taper over week.  Mucinex DM twice daily as needed for cough and congestion. Follow-up with Dr. Melvyn Novas in 4 weeks and as needed Please contact office for sooner follow up if symptoms do not improve or worsen or seek emergency care

## 2015-02-03 NOTE — Addendum Note (Signed)
Addended by: Parke Poisson E on: 02/03/2015 05:49 PM   Modules accepted: Orders

## 2015-02-03 NOTE — Assessment & Plan Note (Signed)
Chest x-ray today Most recent CT chest in February showed stable changes. Patient has an upcoming CT in August.

## 2015-02-03 NOTE — Assessment & Plan Note (Signed)
Xopenex nebulizer treatment in the office Chest x-ray pending  Plan  Levaquin 500 mg daily for 7 days, take with food Prednisone taper over week.  Mucinex DM twice daily as needed for cough and congestion. Follow-up with Dr. Melvyn Novas in 4 weeks and as needed Please contact office for sooner follow up if symptoms do not improve or worsen or seek emergency care

## 2015-02-03 NOTE — Progress Notes (Signed)
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 Advice Only    Was seeing Dr. Patsey Berthold for SOB, has increased in the past several months.    noted decreased activity tol with 30 lb wt gain and now sometimes breathing uncomfortable sitting still, stops just walking across her yard, back from mailbox stopping no real change p saba and worse x sev months, coughs in am so hard vomits, produces white mucus but doesn't wake up but starts when gets to BR p stirring. rec Bisoprolol 5mg  each am in place of coreg to see what effect this has on your exercise tolerance  Pepcid ac 20 mg at bedtime plus chlortrimeton 4mg  at bedtime Incentive spirometry and 30 min of aerobic exercise daily - short of breath not out of breath   03/02/2014 f/u ov/Wert re:  ? Asthma  Chief Complaint  Patient presents with  . Followup with cxr and pft's    Pt states that her breathing has been worse for the past 3-4 days. She states that she is SOB with any exertion.   >> Symbicort rx   02/03/2015 acute office visit   Patient presents for an acute office visit She complains of a productive cough with thick, creamy congestion, sinus drainage, wheezing for 1 week. She took some leftover prednisone over the last 4 days. About 10-20 mg daily. She also started on Bactrim over the last 4 days-this was a leftover anabiotic. She says she has not had any improvement in symptoms. She denies any hemoptysis, chest pain, orthopnea, PND or leg swelling. She does have a history of breast cancer with  previous radiation therapy Most recent CT chest in February showed a left nodular left lower lobe density . Questionable area of scarring. No evidence of metastatic disease. Left infra-hilar nodular tissue with upper normal and stable. She did have a PET scan in December 2015 with no increased activity in the left lung base. There was a new low level hypermetabolic activity along the left hilum. She does have a planned CT chest in August per her oncology.  Patient does have a history of alcohol abuse, does admit that she has recently used alcohol but has not used any for the last 2 weeks. We discussed cessation.  Current Medications, Allergies, Complete Past Medical History, Past Surgical History, Family History, and Social History were reviewed in [employer] record.  ROS  The following are not active complaints unless bolded sore throat, dysphagia, dental problems, itching, sneezing,  nasal congestion or excess/ purulent secretions, ear ache,   fever, chills, sweats, unintended wt loss, pleuritic or exertional cp, hemoptysis,  orthopnea pnd or leg swelling, presyncope, palpitations, heartburn, abdominal pain, anorexia, nausea, vomiting, diarrhea  or change in bowel or urinary habits, change in stools or urine, dysuria,hematuria,  rash, arthralgias, visual complaints, headache, numbness weakness or ataxia or problems with walking or coordination,  change in mood/affect or memory.  Objective:   Physical Exam   amb wf nad   03/02/2014        169   > 173. 02/03/2015   HEENT: nl dentition, turbinates, and orophanx. Nl external ear canals without cough reflex   NECK :  without JVD/Nodes/TM/ nl carotid upstrokes bilaterally   LUNGS: no acc muscle use, expiratory wheezes bilaterally   CV:  RRR  no s3 or murmur or increase in P2, no edema   ABD:  soft and nontender with nl excursion in the supine position. No bruits or organomegaly, bowel  sounds nl  MS:  warm without deformities, calf tenderness, cyanosis or clubbing  SKIN: warm and dry without lesions    NEURO:  alert, approp, no deficits   Between 12/2005 and 06/2006 progressive R lung atx and elevated hd and 07/02/2006 sluggish R hd on fluoro but no paradox   CT 01/08/14  1. Nodule along the inferior aspect of the left major fissure is  likely stable from 07/07/2013 but has enlarged from 06/24/2012.  Additional followup with CT chest without contrast in 6-12 months is  recommended.  2. Fluid collection along the lateral right breast has decreased  slightly in size in the interval and is indicative of a  postoperative seroma.  3. Hepatic steatosis.          Assessment & Plan:

## 2015-02-04 ENCOUNTER — Telehealth: Payer: Self-pay | Admitting: Adult Health

## 2015-02-04 NOTE — Telephone Encounter (Signed)
Pt has been LMTCB per the 6/24 result note Will sign off to decrease duplicate documentation

## 2015-02-04 NOTE — Telephone Encounter (Signed)
Patient calling to get CXR results from yesterday. Patient concerned about pneumonia or bronchitis.  Would like to get answer today if possible.  TP - please advise.

## 2015-02-04 NOTE — Progress Notes (Signed)
Chart and office note reviewed in detail along with available xrays/ labs > agree with a/p as outlined  

## 2015-02-07 ENCOUNTER — Telehealth: Payer: Self-pay | Admitting: Adult Health

## 2015-02-07 NOTE — Telephone Encounter (Signed)
Result Note     cxr with no sign of PNA     Chronic changes    Cont w/ ov recs     Please contact office for sooner follow up if symptoms do not improve or worsen or seek emergency care   --  I spoke with patient about results and she verbalized understanding and had no questions

## 2015-02-10 ENCOUNTER — Encounter: Payer: Self-pay | Admitting: Internal Medicine

## 2015-02-10 ENCOUNTER — Ambulatory Visit (INDEPENDENT_AMBULATORY_CARE_PROVIDER_SITE_OTHER): Payer: Medicare Other | Admitting: Internal Medicine

## 2015-02-10 VITALS — BP 104/68 | HR 67 | Ht 65.0 in | Wt 176.0 lb

## 2015-02-10 DIAGNOSIS — J449 Chronic obstructive pulmonary disease, unspecified: Secondary | ICD-10-CM | POA: Insufficient documentation

## 2015-02-10 DIAGNOSIS — J45991 Cough variant asthma: Secondary | ICD-10-CM | POA: Diagnosis not present

## 2015-02-10 DIAGNOSIS — I10 Essential (primary) hypertension: Secondary | ICD-10-CM | POA: Diagnosis not present

## 2015-02-10 MED ORDER — PREDNISONE 10 MG PO TABS
ORAL_TABLET | ORAL | Status: DC
Start: 2015-02-10 — End: 2015-08-29

## 2015-02-10 MED ORDER — TRAMADOL HCL 50 MG PO TABS: ORAL_TABLET | ORAL | Status: DC

## 2015-02-10 NOTE — Patient Instructions (Addendum)
Please see patient coordinator before you leave today  to schedule CT of sinuses and if neg try For drainage take chlortrimeton (chlorpheniramine) 4 mg every 4 hours available over the counter (may cause drowsiness)   Avoid bird exposure   Prednisone 10 mg take  4 each am x 2 days,   2 each am x 2 days,  1 each am x 2 days and stop   Stay on symbicort 80 Take 2 puffs first thing in am and then another 2 puffs about 12 hours later.   Only use your albuterol as a rescue medication to be used if you can't catch your breath by resting or doing a relaxed purse lip breathing pattern.  - The less you use it, the better it will work when you need it. - Ok to use up to 2 puffs  every 4 hours if you must but call for immediate appointment if use goes up over your usual need - Don't leave home without it !!  (think of it like the spare tire for your car)   Take delsym two tsp every 12 hours and supplement if needed with  tramadol 50 mg up to 2 every 4 hours to suppress the urge to cough. Swallowing water or using ice chips/non mint and menthol containing candies (such as lifesavers or sugarless jolly ranchers) are also effective.  You should rest your voice and avoid activities that you know make you cough.  Once you have eliminated the cough for 3 straight days try reducing the tramadol first,  then the delsym as tolerated.    Try pantoprazole 40 mg  Take 30-60 min before first meal of the day and Pepcid (famotidine) 20 mg one bedtime until cough is completely gone for at least a week without the need for cough suppression   If you are satisfied with your treatment plan,  let your doctor know and he/she can either refill your medications or you can return here when your prescription runs out.     If in any way you are not 100% satisfied,  please tell us.  If 100% better, tell your friends!  Pulmonary follow up is as needed

## 2015-02-10 NOTE — Progress Notes (Signed)
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 Advice Only    Was seeing Dr. Patsey Berthold for SOB, has increased in the past several months.    noted decreased activity tol with 30 lb wt gain and now sometimes breathing uncomfortable sitting still, stops just walking across her yard, back from mailbox stopping no real change p saba and worse x sev months, coughs in am so hard vomits, produces white mucus but doesn't wake up but starts when gets to BR p stirring. rec Bisoprolol 5mg  each am in place of coreg to see what effect this has on your exercise tolerance  Pepcid ac 20 mg at bedtime plus chlortrimeton 4mg  at bedtime Incentive spirometry and 30 min of aerobic exercise daily - short of breath not out of breath   03/02/2014 f/u ov/Morrison Masser re:  ? Asthma  Chief Complaint  Patient presents with  . Followup with cxr and pft's    Pt states that her breathing has been worse for the past 3-4 days. She states that she is SOB with any exertion.   >> Symbicort rx    6/23/2016NP acute office visit  /  Patient presents for an acute office visit She complains of a productive cough with thick, creamy congestion, sinus drainage, wheezing for 1 week. She took some leftover prednisone over the last 4 days. About 10-20 mg daily. She also started on Bactrim over the last 4 days-this was a leftover anabiotic. rec Levaquin 500 mg daily for 7 days, take with food Prednisone taper over week.  Mucinex DM twice daily as needed for cough and congestion.   02/10/2015 f/u  ov/Alberta Cairns re: on symbicort 80 2bid but not maintaining it between flares  and no rescue/ not on gerd rx Chief Complaint  Patient presents with  . Acute Visit    Pt states cough not improving very much since last visit 02/03/15- prod with large amounts of white sputum. Her wheezing is also not resolved, but it is some better. She has occ SOB. She is using albuterol at least 3 x per day.   for months stuffy nose and excess mucus then severe coughing fits developed   No obvious day to day or daytime variabilty or assoc excess or purulent sputum  or cp or chest tightness, subjective wheeze or overt   hb symptoms. No unusual exp hx or h/o childhood pna/ asthma or knowledge of premature birth.  Sleeping ok without nocturnal  or early am exacerbation  of respiratory  c/o's or need for noct saba. Also denies any obvious fluctuation of symptoms with weather or environmental changes or other aggravating or alleviating factors except as outlined above   Current Medications, Allergies, Complete Past Medical History, Past Surgical History, Family History, and Social History were reviewed in [employer] record.  ROS  The following are not active complaints unless bolded sore throat, dysphagia, dental problems, itching, sneezing,  nasal congestion or excess/ purulent secretions, ear ache,  fever, chills, sweats, unintended wt loss, pleuritic or exertional cp, hemoptysis,  orthopnea pnd or leg swelling, presyncope, palpitations, abdominal pain, anorexia, nausea, vomiting, diarrhea  or change in bowel or urinary habits, change in stools or urine, dysuria,hematuria,  rash, arthralgias, visual complaints, headache, numbness weakness or ataxia or problems with walking or coordination,  change in mood/affect or memory.          Objective:   Physical Exam   amb wf nad   03/02/2014        169   > 173. 02/03/2015 > 02/10/2015 176   HEENT: nl dentition, turbinates, and orophanx. Nl external ear  canals without cough reflex   NECK :  without JVD/Nodes/TM/ nl carotid upstrokes bilaterally   LUNGS: no acc muscle use,  Mid  expiratory wheezes/rhonchi bilaterally   CV:  RRR  no s3 or murmur or increase in P2, no edema   ABD:  soft and nontender with nl excursion in the supine position. No bruits or organomegaly, bowel sounds nl  MS:  warm without deformities, calf tenderness, cyanosis or clubbing  SKIN: warm and dry without lesions    NEURO:  alert, approp, no deficits   Between 12/2005 and 06/2006 progressive R lung atx and elevated hd and 07/02/2006 sluggish R hd on fluoro but no paradox   I personally reviewed images and agree with radiology impression as follows:  CXR:  02/04/15 Chronic changes in the right lung base stable from the previous exam. No acute abnormality is noted..            Assessment & Plan:   Outpatient Encounter Prescriptions as of 02/10/2015  Medication Sig  . bisoprolol (ZEBETA) 5 MG tablet Take 1 tablet (5 mg total) by mouth daily.  . budesonide-formoterol (SYMBICORT) 80-4.5 MCG/ACT inhaler Take 2 puffs first thing in am and then another 2 puffs about 12 hours later.  . famotidine (PEPCID) 20 MG tablet Take 20 mg by mouth at bedtime.  . gabapentin (NEURONTIN) 300 MG capsule 1-2 at night for sleep/pain (Patient taking differently: 1-2 at night for sleep/pain as needed)  . letrozole (FEMARA) 2.5 MG tablet Take 1 tablet (2.5 mg total) by mouth daily.  Marland Kitchen LORazepam (ATIVAN) 2 MG tablet 1/2-1 tablet PRN for sleep at night  . pantoprazole (PROTONIX) 40 MG tablet Take 1 tablet (40 mg total) by mouth daily. Take 30-60 min before first meal of the day  . predniSONE (DELTASONE) 10 MG tablet Take 4 tabs for 2 days, then 3 tabs for 2 days, 2 tabs for 2 days, then 1 tab for 2 days, then stop.  Marland Kitchen PROAIR HFA 108 (90 BASE) MCG/ACT inhaler USE 2 PUFFS BY MOUTH EVERY 6 HOURS AS NEEDED FOR WHEEZING OR SHORTNESSOF BREATH  . predniSONE (DELTASONE) 10 MG tablet Take   4 each am x 2 days,   2 each am x 2 days,  1 each am x 2 days and stop  . traMADol (ULTRAM) 50 MG tablet 1-2 every 4 hours as needed for cough or pain  . [DISCONTINUED] levofloxacin (LEVAQUIN) 500 MG tablet Take 1 tablet (500 mg total) by mouth daily.   No facility-administered encounter medications on file as of 02/10/2015.

## 2015-02-13 ENCOUNTER — Encounter: Payer: Self-pay | Admitting: Internal Medicine

## 2015-02-13 NOTE — Assessment & Plan Note (Signed)
Changed coreg to bisoprolol 01/27/2014 due to concerns ? Asthma   Adequate control on present rx, reviewed > no change in rx needed  = bisoprolol 5 mg daily

## 2015-02-13 NOTE — Assessment & Plan Note (Addendum)
-   03/02/2014  pfts VC  1.48 s obst, dlco 78 corrects to 154 and ERV 16% - 02/10/2015 p extensive coaching HFA effectiveness =  90%   DDX of  difficult airways management all start with A and  include Adherence, Ace Inhibitors, Acid Reflux, Active Sinus Disease, Alpha 1 Antitripsin deficiency, Anxiety masquerading as Airways dz,  ABPA,  allergy(esp in young), Aspiration (esp in elderly), Adverse effects of meds,  Active smokers, A bunch of PE's (a small clot burden can't cause this syndrome unless there is already severe underlying pulm or vascular dz with poor reserve) plus two Bs  = Bronchiectasis and Beta blocker use..and one C= CHF   Adherence is always the initial "prime suspect" and is a multilayered concern that requires a "trust but verify" approach in every patient - starting with knowing how to use medications, especially inhalers, correctly, keeping up with refills and understanding the fundamental difference between maintenance and prns vs those medications only taken for a very short course and then stopped and not refilled.  The proper method of use, as well as anticipated side effects, of a metered-dose inhaler are discussed and demonstrated to the patient. Improved effectiveness after extensive coaching during this visit to a level of approximately  90% but needs to be more aggressive taking it esp with flares > for now rec symb 80 2bid as least likely to aggravate the cough  ? Acid (or non-acid) GERD > always difficult to exclude as up to 75% of pts in some series report no assoc GI/ Heartburn symptoms> rec max (24h)  acid suppression and diet restrictions/ reviewed and instructions given in writing.  Also reminded her: Explained the natural history of uri /"sinus drainage" syndromes of all types and why it's necessary in patients at risk to treat GERD aggressively - at least  short term -   to reduce risk of evolving cyclical cough initially  triggered by epithelial injury and a heightened  sensitivty to the effects of any upper airway irritants,  most importantly acid - related - then perpetuated by epithelial injury related to the cough itself as the upper airway collapses on itself.  That is, the more sensitive the epithelium becomes once it is damaged by the virus, the more the ensuing irritability> the more the cough, the more the secondary reflux (especially in those prone to reflux) the more the irritation of the sensitive mucosa and so on in a  Classic cyclical pattern.    ? Active sinus dz > needs CT sinus  ? Allergy/ bird exp noted > eliminate bird if possible > Prednisone 10 mg take  4 each am x 2 days,   2 each am x 2 days,  1 each am x 2 days and stop    I had an extended discussion with the patient reviewing all relevant studies completed to date and  lasting 15 to 20 minutes of a 25 minute visit    Each maintenance medication was reviewed in detail including most importantly the difference between maintenance and prns and under what circumstances the prns are to be triggered using an action plan format that is not reflected in the computer generated alphabetically organized AVS.    Please see instructions for details which were reviewed in writing and the patient given a copy highlighting the part that I personally wrote and discussed at today's ov.

## 2015-02-16 ENCOUNTER — Inpatient Hospital Stay: Admission: RE | Admit: 2015-02-16 | Payer: Self-pay | Source: Ambulatory Visit

## 2015-02-16 ENCOUNTER — Telehealth: Payer: Self-pay | Admitting: Internal Medicine

## 2015-02-16 DIAGNOSIS — Z853 Personal history of malignant neoplasm of breast: Secondary | ICD-10-CM | POA: Diagnosis not present

## 2015-02-16 NOTE — Telephone Encounter (Signed)
Patient called to advise she has a history of breast cancer and found a lump in her left breast, location: Noon, 2 inches above nipple, left breast. A.Collier, reccommended diagnostic mammogram and ultrasound to be done at [facility] today. Written order given to patient to take to [facility].

## 2015-02-28 ENCOUNTER — Telehealth: Payer: Self-pay

## 2015-02-28 NOTE — Telephone Encounter (Signed)
Mammogram results dtd 02/16/15 rcvd from [facility].  Reviewed by Dr. Lindi Adie.  Sent to scan.

## 2015-02-28 NOTE — Telephone Encounter (Signed)
utrasound results rcvd from [facility] dtd 02/16/15.  Reviewed by Dr. Lindi Adie.  Sent to scan.

## 2015-03-02 ENCOUNTER — Ambulatory Visit: Payer: Self-pay | Admitting: Internal Medicine

## 2015-03-08 ENCOUNTER — Telehealth: Payer: Self-pay | Admitting: Hematology and Oncology

## 2015-03-08 NOTE — Telephone Encounter (Signed)
Patient called in to verify her appointments and she ill get a call for her scan closer to the requested time  Suzanne [name]

## 2015-04-01 ENCOUNTER — Telehealth: Payer: Self-pay | Admitting: Hematology and Oncology

## 2015-04-01 NOTE — Telephone Encounter (Signed)
Returned patients call to reschedule her appointment

## 2015-04-06 ENCOUNTER — Ambulatory Visit: Payer: Self-pay | Admitting: Hematology and Oncology

## 2015-04-21 ENCOUNTER — Telehealth: Payer: Self-pay | Admitting: Internal Medicine

## 2015-04-21 MED ORDER — BUDESONIDE-FORMOTEROL FUMARATE 80-4.5 MCG/ACT IN AERO: INHALATION_SPRAY | RESPIRATORY_TRACT | Status: DC

## 2015-04-21 NOTE — Telephone Encounter (Signed)
Patient is having CT of Chest and needs CT of Sinuses done at [facility] Patient wants to have both CT's done at the same time.  [facility]- can you help patient with this?

## 2015-04-21 NOTE — Telephone Encounter (Signed)
The order is already put in, he put the order in 02/10/15

## 2015-04-21 NOTE — Telephone Encounter (Signed)
We can schedule it but an order will need to be put in.

## 2015-04-21 NOTE — Telephone Encounter (Signed)
Called pt and she needed refill on symb 80 mcg. I have sent this in. Nothing further needed

## 2015-04-22 NOTE — Telephone Encounter (Signed)
I called the patient to ask when her CT of the Chest is scheduled at [facility]. We can't see when it is scheduled so [place] ask me to call her to find out when it is scheduled. When we find out the date we can try and get both done at the same time at [facility]

## 2015-04-25 ENCOUNTER — Telehealth: Payer: Self-pay | Admitting: Hematology and Oncology

## 2015-04-25 NOTE — Telephone Encounter (Signed)
No and I just tried calling the patient again and she is not answering the phone or returning my calls

## 2015-04-25 NOTE — Telephone Encounter (Signed)
patient called in to reschedule her visit due to ct scan appointment

## 2015-04-25 NOTE — Telephone Encounter (Signed)
Rodena Piety, has this been taken care of yet?  Please advise.

## 2015-04-26 ENCOUNTER — Ambulatory Visit: Payer: Self-pay | Admitting: Hematology and Oncology

## 2015-04-27 ENCOUNTER — Ambulatory Visit (HOSPITAL_COMMUNITY)
Admission: RE | Admit: 2015-04-27 | Discharge: 2015-04-27 | Disposition: A | Discharge status: Home / Self Care | Payer: Medicare Other | Source: Ambulatory Visit | Attending: Hematology and Oncology | Admitting: Hematology and Oncology

## 2015-04-27 DIAGNOSIS — C50511 Malignant neoplasm of lower-outer quadrant of right female breast: Secondary | ICD-10-CM | POA: Insufficient documentation

## 2015-04-27 DIAGNOSIS — J45991 Cough variant asthma: Secondary | ICD-10-CM | POA: Insufficient documentation

## 2015-04-27 DIAGNOSIS — R05 Cough: Secondary | ICD-10-CM

## 2015-04-27 DIAGNOSIS — R059 Cough, unspecified: Secondary | ICD-10-CM

## 2015-04-27 MED ORDER — IOHEXOL 300 MG/ML  SOLN
75.0000 mL | Freq: Once | INTRAMUSCULAR | Status: AC | PRN
  Administered 2015-04-27: 75 mL via INTRAVENOUS

## 2015-04-28 ENCOUNTER — Telehealth: Payer: Self-pay | Admitting: Internal Medicine

## 2015-04-28 NOTE — Progress Notes (Signed)
Quick Note:  LMTCB ______ 

## 2015-04-28 NOTE — Telephone Encounter (Signed)
Result Note     Call patient : Study is unremarkable, no change in recs - be sure has f/u if not all better  ---  I spoke with patient about results and she verbalized understanding and had no questions.

## 2015-05-02 ENCOUNTER — Ambulatory Visit (HOSPITAL_BASED_OUTPATIENT_CLINIC_OR_DEPARTMENT_OTHER): Payer: Medicare Other | Admitting: Hematology and Oncology

## 2015-05-02 ENCOUNTER — Encounter: Payer: Self-pay | Admitting: Hematology and Oncology

## 2015-05-02 DIAGNOSIS — C50511 Malignant neoplasm of lower-outer quadrant of right female breast: Secondary | ICD-10-CM | POA: Diagnosis not present

## 2015-05-02 DIAGNOSIS — Z170 Estrogen receptor positive status [ER+]: Secondary | ICD-10-CM | POA: Diagnosis not present

## 2015-05-02 DIAGNOSIS — R911 Solitary pulmonary nodule: Secondary | ICD-10-CM | POA: Diagnosis not present

## 2015-05-02 DIAGNOSIS — R05 Cough: Secondary | ICD-10-CM

## 2015-05-02 DIAGNOSIS — C773 Secondary and unspecified malignant neoplasm of axilla and upper limb lymph nodes: Secondary | ICD-10-CM | POA: Diagnosis not present

## 2015-05-02 DIAGNOSIS — R062 Wheezing: Secondary | ICD-10-CM

## 2015-05-02 DIAGNOSIS — Z79811 Long term (current) use of aromatase inhibitors: Secondary | ICD-10-CM
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
## 2015-05-03 ENCOUNTER — Telehealth: Payer: Self-pay | Admitting: Hematology and Oncology

## 2015-05-03 NOTE — Telephone Encounter (Signed)
Appointments made and calendar was mailed to the patient

## 2015-05-25 ENCOUNTER — Encounter: Payer: Self-pay | Admitting: Physician Assistant

## 2015-06-10 ENCOUNTER — Other Ambulatory Visit: Payer: Self-pay | Admitting: Physician Assistant

## 2015-06-11 ENCOUNTER — Other Ambulatory Visit: Payer: Self-pay | Admitting: Internal Medicine

## 2015-06-14 ENCOUNTER — Other Ambulatory Visit: Payer: Self-pay | Admitting: Physician Assistant

## 2015-06-15 NOTE — Telephone Encounter (Signed)
Rx called into [facility] pharmacy. 

## 2015-07-06 ENCOUNTER — Encounter: Payer: Self-pay | Admitting: Physician Assistant

## 2015-08-03 ENCOUNTER — Other Ambulatory Visit: Payer: Self-pay | Admitting: Internal Medicine

## 2015-08-08 ENCOUNTER — Encounter: Payer: Self-pay | Admitting: *Deleted

## 2015-08-11 ENCOUNTER — Ambulatory Visit: Payer: Self-pay | Admitting: Nurse Practitioner

## 2015-08-12 ENCOUNTER — Telehealth: Payer: Self-pay | Admitting: Cardiovascular Disease

## 2015-08-12 NOTE — Telephone Encounter (Signed)
Spoke w/ pt.  Advised her that I do not have a doctor in the office today, as Dr. Rockey Situ is sick and Dr. Fletcher Anon is on vacation.  Advised her to contact her PCP, as the lump on her neck is not cardiac related. Pt insists on being seen today by someone, even an NP. Advised her that the office is essentially closed today due to no provider and that we are taking messages.  She states that she has "absolutely no confidence" in her PCP and does not want to call them. Offered to provide her w/ #s to local primary care offices, but she declines. She states that she would like to be worked in due to her strong family history of cardiac issues and she is concerned about cough.  Pt refuses to go to ED or to schedule in [place].  Pt transferred to scheduling.

## 2015-08-12 NOTE — Telephone Encounter (Signed)
Patient wants to make sure current symptoms are not cardiac related.  Patient wants to be seen before next available appt in February.  Patient has pain and weakness in L arm constant and also chronic cough .  Patient also c/o bulging in base on L side of neck that is new within the last month continues to grow and is sore.    Please call to discuss.

## 2015-08-24 ENCOUNTER — Other Ambulatory Visit: Payer: Self-pay | Admitting: Physician Assistant

## 2015-08-24 MED ORDER — LORAZEPAM 2 MG PO TABS: ORAL_TABLET | ORAL | Status: DC

## 2015-08-24 NOTE — Progress Notes (Signed)
Rx called in to [facility] pharmacy.

## 2015-08-29 ENCOUNTER — Other Ambulatory Visit: Payer: Self-pay

## 2015-08-29 ENCOUNTER — Encounter: Payer: Self-pay | Admitting: Physician Assistant

## 2015-08-29 ENCOUNTER — Ambulatory Visit (INDEPENDENT_AMBULATORY_CARE_PROVIDER_SITE_OTHER): Payer: Medicare Other | Admitting: Physician Assistant

## 2015-08-29 VITALS — BP 100/64 | HR 67 | Temp 97.7°F | Ht 65.0 in | Wt 180.0 lb

## 2015-08-29 DIAGNOSIS — Z0001 Encounter for general adult medical examination with abnormal findings: Secondary | ICD-10-CM

## 2015-08-29 DIAGNOSIS — I493 Ventricular premature depolarization: Secondary | ICD-10-CM

## 2015-08-29 DIAGNOSIS — I519 Heart disease, unspecified: Secondary | ICD-10-CM | POA: Diagnosis not present

## 2015-08-29 DIAGNOSIS — G4700 Insomnia, unspecified: Secondary | ICD-10-CM

## 2015-08-29 DIAGNOSIS — R918 Other nonspecific abnormal finding of lung field: Secondary | ICD-10-CM

## 2015-08-29 DIAGNOSIS — R6889 Other general symptoms and signs: Secondary | ICD-10-CM | POA: Diagnosis not present

## 2015-08-29 DIAGNOSIS — E785 Hyperlipidemia, unspecified: Secondary | ICD-10-CM

## 2015-08-29 DIAGNOSIS — F1010 Alcohol abuse, uncomplicated: Secondary | ICD-10-CM

## 2015-08-29 DIAGNOSIS — Z79899 Other long term (current) drug therapy: Secondary | ICD-10-CM

## 2015-08-29 DIAGNOSIS — R0600 Dyspnea, unspecified: Secondary | ICD-10-CM

## 2015-08-29 DIAGNOSIS — D649 Anemia, unspecified: Secondary | ICD-10-CM | POA: Diagnosis not present

## 2015-08-29 DIAGNOSIS — G2581 Restless legs syndrome: Secondary | ICD-10-CM

## 2015-08-29 DIAGNOSIS — K2100 Gastro-esophageal reflux disease with esophagitis, without bleeding: Secondary | ICD-10-CM

## 2015-08-29 DIAGNOSIS — Z0000 Encounter for general adult medical examination without abnormal findings: Secondary | ICD-10-CM | POA: Diagnosis not present

## 2015-08-29 DIAGNOSIS — I10 Essential (primary) hypertension: Secondary | ICD-10-CM | POA: Diagnosis not present

## 2015-08-29 DIAGNOSIS — E669 Obesity, unspecified: Secondary | ICD-10-CM

## 2015-08-29 DIAGNOSIS — E559 Vitamin D deficiency, unspecified: Secondary | ICD-10-CM | POA: Diagnosis not present

## 2015-08-29 DIAGNOSIS — J45991 Cough variant asthma: Secondary | ICD-10-CM

## 2015-08-29 DIAGNOSIS — C50511 Malignant neoplasm of lower-outer quadrant of right female breast: Secondary | ICD-10-CM

## 2015-08-29 DIAGNOSIS — R7303 Prediabetes: Secondary | ICD-10-CM | POA: Diagnosis not present

## 2015-08-29 DIAGNOSIS — Z1159 Encounter for screening for other viral diseases: Secondary | ICD-10-CM

## 2015-08-29 DIAGNOSIS — F1320 Sedative, hypnotic or anxiolytic dependence, uncomplicated: Secondary | ICD-10-CM

## 2015-08-29 DIAGNOSIS — R222 Localized swelling, mass and lump, trunk: Secondary | ICD-10-CM

## 2015-08-29 DIAGNOSIS — R7309 Other abnormal glucose: Secondary | ICD-10-CM | POA: Diagnosis not present

## 2015-08-29 LAB — CBC WITH DIFFERENTIAL/PLATELET
BASOS PCT: 0 % (ref 0–1)
Basophils Absolute: 0 10*3/uL (ref 0.0–0.1)
EOS ABS: 0.1 10*3/uL (ref 0.0–0.7)
Eosinophils Relative: 1 % (ref 0–5)
HCT: 44 % (ref 36.0–46.0)
HEMOGLOBIN: 14.8 g/dL (ref 12.0–15.0)
Lymphocytes Relative: 26 % (ref 12–46)
Lymphs Abs: 1.4 10*3/uL (ref 0.7–4.0)
MCH: 33.7 pg (ref 26.0–34.0)
MCHC: 33.6 g/dL (ref 30.0–36.0)
MCV: 100.2 fL — ABNORMAL HIGH (ref 78.0–100.0)
MPV: 9.5 fL (ref 8.6–12.4)
Monocytes Absolute: 0.8 10*3/uL (ref 0.1–1.0)
Monocytes Relative: 14 % — ABNORMAL HIGH (ref 3–12)
NEUTROS ABS: 3.2 10*3/uL (ref 1.7–7.7)
NEUTROS PCT: 59 % (ref 43–77)
PLATELETS: 228 10*3/uL (ref 150–400)
RBC: 4.39 MIL/uL (ref 3.87–5.11)
RDW: 13.2 % (ref 11.5–15.5)
WBC: 5.5 10*3/uL (ref 4.0–10.5)

## 2015-08-29 LAB — HEMOGLOBIN A1C
Hgb A1c MFr Bld: 5.9 % — ABNORMAL HIGH (ref <5.7)
Mean Plasma Glucose: 123 mg/dL — ABNORMAL HIGH (ref <117)

## 2015-08-29 NOTE — Progress Notes (Signed)
Complete Physical  Assessment and Plan: 1. Essential hypertension - continue medications, DASH diet, exercise and monitor at home. Call if greater than 130/80.  - CBC with Differential/Platelet - BASIC METABOLIC PANEL WITH GFR - Hepatic function panel - TSH - Urinalysis, Routine w reflex microscopic (not at [facility]) - Microalbumin / creatinine urine ratio - EKG 12-Lead  2. Systolic dysfunction Normal EF recently  3. Prediabetes - Hemoglobin A1c - Insulin, fasting  4. Benzodiazepine dependence (HCC) monitor  5. Alcohol abuse Unsupportive home, suggest out patient treatment and or AA  6. Breast cancer of lower-outer quadrant of right female breast [facility]) Continue follow up oncology  7. Lung nodules stable  8. Obesity Obesity with co morbidities- long discussion about weight loss, diet, and exercise  9. RLS (restless legs syndrome) Continue medications  10. Gastroesophageal reflux disease with esophagitis Continue PPI/H2 blocker, diet discussed  11. Cough variant asthma Continue follow up [place].   12. Insomnia Continue medications  13. PVC (premature ventricular contraction) monitor  14. Dyspnea Continue follow up Dr. Melvyn Novas and Dr. Rogue Jury May get CTA chest pending [place] Suggest [place], may exacerbate dyspnea with GERD - Sedimentation rate - Anti-DNA antibody, double-stranded - D-dimer, quantitative (not at [facility]) - CT Angio Chest W/Cm &/Or Wo Cm; Future  15. Supraclavicular fossa fullness ? Lymph node versus vascular congestion Pending [place] will either get CT AB and chest - US Soft Tissue Head/Neck; Future  16. Hyperlipidemia - Lipid panel  17. Medication management - Magnesium  18. Vitamin D deficiency - VITAMIN D 25 Hydroxy (Vit-D Deficiency, Fractures)  19. Anemia, unspecified anemia type - Iron and TIBC - Ferritin  20. Screening for viral disease - HIV antibody - Hepatitis C antibody  21. Encounter for general adult medical examination  with abnormal findings - CBC with Differential/Platelet - BASIC METABOLIC PANEL WITH GFR - Hepatic function panel - TSH - Lipid panel - Hemoglobin A1c - Insulin, fasting - Magnesium - VITAMIN D 25 Hydroxy (Vit-D Deficiency, Fractures) - Urinalysis, Routine w reflex microscopic (not at [facility]) - Microalbumin / creatinine urine ratio - Iron and TIBC - Ferritin - EKG 12-Lead - HIV antibody - Hepatitis C antibody  Discussed med's effects and SE's. Screening labs and tests as requested with regular follow-up as recommended. Over 40 minutes of exam, counseling, chart review, and complex, high level critical decision making was performed this visit.  Future Appointments Date Time Provider [place]  09/30/2015 3:00 PM Wellington Hampshire, MD [id number]  04/17/2016 3:00 PM [facility] 4 [facility] None  04/19/2016 3:30 PM Nicholas Lose, MD [facility] None  08/29/2016 2:00 PM Vicie Mutters, PA-C [facility] None    HPI  60 y.o. female  presents for a complete physical.   Her blood pressure has been controlled at home, BP: 100/64 mmHg  She does workout. She denies chest pain, shortness of breath, dizziness.  She is not on cholesterol medication and denies myalgias. Her cholesterol is not at goal. The cholesterol last visit was:  Lab Results  Component Value Date   CHOL 177 08/26/2014   HDL 56 08/26/2014   LDLCALC 98 08/26/2014   TRIG 114 08/26/2014   CHOLHDL 3.2 08/26/2014   She does have a history of alcoholism, husband not supportive, states it is worse with the holidays and admits to drinking a bottle of wine a night.  Lab Results  Component Value Date   ALT 66* 08/26/2014   AST 46* 08/26/2014   ALKPHOS 61 08/26/2014   BILITOT  0.4 08/26/2014   Lab Results  Component Value Date   WBC 6.7 08/26/2014   HGB 15.1* 08/26/2014   HCT 43.9 08/26/2014   MCV 99.1 08/26/2014   PLT 320 08/26/2014    She is on ativan 2mg , takes 1/2  for sleep/RLS, does not take it  with alcohol, states it helps. She was on neurotin in the past which helped, she would like a refill to try to use rather than the gabapentin. She could not tolerate the Requip.   She has a history of right breast cancer, still on femara and is now following with Dr. Lindi Adie. Had CT chest 04/2015, showed stable pulmonary nodules.  She states she has worsening dyspnea x 1 year, she is following with Dr. Melvyn Novas and Dr. Rogue Jury, she has wheezing, cough with white sputum, SOB with exertion, for the past month she has also noted that she has some swelling left supraclavicular with left arm achy/pain, states she can not sleep on her left side or she will have dyspnea/gasping and have to sit up.   She has been working on diet and exercise for prediabetes, and denies paresthesia of the feet, polydipsia, polyuria and visual disturbances. Last A1C in the office was:  Lab Results  Component Value Date   HGBA1C 5.9* 08/26/2014   Patient is on Vitamin D supplement.   Lab Results  Component Value Date   VD25OH 34 08/26/2014   BMI is Body mass index is 29.95 kg/(m^2)., she is working on diet and exercise. Wt Readings from Last 3 Encounters:  08/29/15 180 lb (81.647 kg)  05/02/15 177 lb 14.4 oz (80.695 kg)  02/10/15 176 lb (79.833 kg)     Current Medications:  Current Outpatient Prescriptions on File Prior to Visit  Medication Sig Dispense Refill  . bisoprolol (ZEBETA) 5 MG tablet Take 1 tablet (5 mg total) by mouth daily. 90 tablet 3  . famotidine (PEPCID) 20 MG tablet Take 20 mg by mouth at bedtime.    . gabapentin (NEURONTIN) 300 MG capsule 1-2 at night for sleep/pain (Patient taking differently: 1-2 at night for sleep/pain as needed) 60 capsule 2  . letrozole (FEMARA) 2.5 MG tablet Take 1 tablet (2.5 mg total) by mouth daily. 90 tablet 3  . LORazepam (ATIVAN) 2 MG tablet TAKE ONE-HALF TO ONE TABLET BY MOUTH AS NEEDED AT BEDTIME 30 tablet 3  . pantoprazole (PROTONIX) 40 MG tablet Take 1 tablet (40 mg  total) by mouth daily. Take 30-60 min before first meal of the day 30 tablet 2  . PROAIR HFA 108 (90 BASE) MCG/ACT inhaler USE 2 PUFFS BY MOUTH EVERY 6 HOURS AS NEEDED FOR WHEEZING OR SHORTNESSOF BREATH 8.5 each 3  . SYMBICORT 80-4.5 MCG/ACT inhaler INHALE 2 PUFFS EVERY MORNING THEN INHALE 2 PUFFS 12 HOURS LATER 10.2 Inhaler 0   No current facility-administered medications on file prior to visit.   Health Maintenance:   Immunization History  Administered Date(s) Administered  . DTaP 05/24/2015  . Influenza Split 05/28/2013  . Influenza Whole 08/21/2007  . Influenza,inj,Quad PF,36+ Mos 05/13/2014  . Influenza-Unspecified 05/24/2015  . Pneumococcal Conjugate-13 08/23/2014  . Pneumococcal Polysaccharide-23 08/21/2007  . Td 05/14/2012  . Zoster 05/16/2011    Tetanus: 2016 Pneumovax: 2009 Prevnar 13: 2016 Flu vaccine:  2016 Zostavax: 2012  Pap: remote, declines another MGM:  02/2015 DEXA: 12/2012, due 2017 Colonoscopy: 2012 due 10 years PFT 02/2014 CT chest 04/2015 PET 07/2014, negative Echo 09/2014, EF 60-65%  Last Dental Exam: Dr. Carman Ching Last  Eye Exam: Dr. Gershon Crane  Patient Care Team: Unk Pinto, MD as PCP - General (Internal Medicine) Ronald Lobo, MD as Consulting Physician (Gastroenterology) Tanda Rockers, MD as Consulting Physician (Pulmonary Disease) Nicholas Lose, MD as Consulting Physician (Hematology and Oncology) Jolaine Artist, MD as Consulting Physician (Cardiology)  Allergies:  Allergies  Allergen Reactions  . Ace Inhibitors     cough   Medical History:  Past Medical History  Diagnosis Date  . Dyspnea     myoview 2011: EF 55% question of  mild reverible anterior defect. thought to be breast attenuation. echo 45-50% with global HK. Grade 1 diastolyic dysfunction. RV nml. cardiac MRI with EF 44% with septal HK. no scar   . Cancer [facility])     breast ca - 1994; recurred in 2007. s/p masectomy with flap rconstruction aand chemo '94. local  recurrence on chest wall. chemo and XRT '07. now femara  . Lung disorder   . History of coronary artery stent placement   . Hyperlipidemia   . Hypertension   . Insomnia   . Alcoholism ([place])   . Depression    Surgical History:  Past Surgical History  Procedure Laterality Date  . R transflap  '94  . R masectomy  '94   . Mastectomy    . Orif distal radius fracture    . Cardiac catheterization      [facility]  . Port-a-cath removal  07/29/2012    Procedure: REMOVAL PORT-A-CATH;  Surgeon: Haywood Lasso, MD;  Location: [place];  Service: General;  Laterality: Left;   Family History:  Family History  Problem Relation Age of Onset  . Coronary artery disease      family hx  . Hyperlipidemia      family hx  . Thyroid disease      family hx   . Hypertension Mother   . Alcohol abuse Paternal Grandfather   . Alcohol abuse Paternal Uncle   . Heart attack Father   . Alcohol abuse Father   . Cancer Paternal Grandmother     breast  . Alcohol abuse Brother    Social History:  Social History  Substance Use Topics  . Smoking status: Never Smoker   . Smokeless tobacco: Never Used     Comment: just socially   . Alcohol Use: No     Comment: No alcohol since 05/22/2012    Review of Systems: Review of Systems  Constitutional: Positive for malaise/fatigue. Negative for fever, chills, weight loss and diaphoresis.  HENT: Negative.   Eyes: Negative.   Respiratory: Positive for cough, sputum production, shortness of breath and wheezing. Negative for hemoptysis.   Cardiovascular: Positive for orthopnea (worse on left side). Negative for chest pain, palpitations, claudication, leg swelling and PND.  Gastrointestinal: Negative.   Genitourinary: Negative.   Musculoskeletal: Negative for myalgias, back pain, joint pain, falls and neck pain.  Skin: Negative.  Negative for rash.  Neurological: Negative.  Negative for dizziness, focal weakness and weakness.   Psychiatric/Behavioral: Positive for depression and substance abuse. Negative for suicidal ideas, hallucinations and memory loss. The patient is nervous/anxious and has insomnia.   All other systems reviewed and are negative.   Physical Exam: Estimated body mass index is 29.95 kg/(m^2) as calculated from the following:   Height as of this encounter: 5\' 5"  (1.651 m).   Weight as of this encounter: 180 lb (81.647 kg). BP 100/64 mmHg  Pulse 67  Temp(Src) 97.7 F (36.5 C) (Temporal)  Ht  5\' 5"  (1.651 m)  Wt 180 lb (81.647 kg)  BMI 29.95 kg/m2  SpO2 93% General Appearance: Well nourished, in no apparent distress.  Eyes: PERRLA, EOMs, conjunctiva no swelling or erythema, normal fundi and vessels.  Sinuses: No Frontal/maxillary tenderness  ENT/Mouth: Ext aud canals clear, normal light reflex with TMs without erythema, bulging. Good dentition. No erythema, swelling, or exudate on post pharynx. Tonsils not swollen or erythematous. Hearing normal.  Neck: Supple, thyroid normal. No bruits  Respiratory: Respiratory effort normal, BS equal bilaterally with diffuse wheezine, without rales, rhonchi, or stridor. Cardio: RRR without murmurs, rubs or gallops. Brisk peripheral pulses without edema, brisk pulses upper extremities..  Chest: symmetric, with normal excursions and percussion.  Breasts: Symmetric, without lumps, nipple discharge, retractions.  Abdomen: Soft, nontender, no guarding, rebound, hernias, masses, or organomegaly.  Lymphatics: Tender full supraclavicular fossa without palpable lymph node-  Non tender without lymphadenopathy.  Genitourinary: defer Musculoskeletal: Full ROM all peripheral extremities,5/5 strength, and normal gait.  Skin: right nose with small red vascular area, Warm, dry without rashes, lesions, ecchymosis. Neuro: Cranial nerves intact, reflexes equal bilaterally. Normal muscle tone, no cerebellar symptoms. Sensation intact.  Psych: Awake and oriented X 3, normal  affect, Insight and Judgment appropriate.   EKG: WNL no ST changes. AORTA SCAN: defer  Vicie Mutters 2:50 PM [facility]

## 2015-08-30 LAB — SEDIMENTATION RATE: Sed Rate: 4 mm/hr (ref 0–30)

## 2015-08-30 LAB — LIPID PANEL
CHOL/HDL RATIO: 3.1 ratio (ref <=5.0)
CHOLESTEROL: 181 mg/dL (ref 125–200)
HDL: 58 mg/dL (ref >=46)
LDL Cholesterol: 96 mg/dL (ref <130)
Triglycerides: 133 mg/dL (ref <150)
VLDL: 27 mg/dL (ref <30)

## 2015-08-30 LAB — IRON AND TIBC
%SAT: 54 % — ABNORMAL HIGH (ref 11–50)
Iron: 200 ug/dL — ABNORMAL HIGH (ref 45–160)
TIBC: 368 ug/dL (ref 250–450)
UIBC: 168 ug/dL (ref 125–400)

## 2015-08-30 LAB — BASIC METABOLIC PANEL WITH GFR
BUN: 11 mg/dL (ref 7–25)
CO2: 26 mmol/L (ref 20–31)
CREATININE: 0.63 mg/dL (ref 0.50–1.05)
Calcium: 9.3 mg/dL (ref 8.6–10.4)
Chloride: 99 mmol/L (ref 98–110)
GFR, Est African American: >89 mL/min (ref >=60)
Glucose, Bld: 91 mg/dL (ref 65–99)
Potassium: 4 mmol/L (ref 3.5–5.3)
SODIUM: 137 mmol/L (ref 135–146)

## 2015-08-30 LAB — HEPATIC FUNCTION PANEL
ALT: 75 U/L — AB (ref 6–29)
AST: 45 U/L — ABNORMAL HIGH (ref 10–35)
Albumin: 3.9 g/dL (ref 3.6–5.1)
Alkaline Phosphatase: 61 U/L (ref 33–130)
BILIRUBIN DIRECT: 0.1 mg/dL (ref <=0.2)
BILIRUBIN INDIRECT: 0.5 mg/dL (ref 0.2–1.2)
BILIRUBIN TOTAL: 0.6 mg/dL (ref 0.2–1.2)
Total Protein: 6.5 g/dL (ref 6.1–8.1)

## 2015-08-30 LAB — URINALYSIS, ROUTINE W REFLEX MICROSCOPIC
Bilirubin Urine: NEGATIVE
Glucose, UA: NEGATIVE
HGB URINE DIPSTICK: NEGATIVE
KETONES UR: NEGATIVE
Leukocytes, UA: NEGATIVE
NITRITE: NEGATIVE
PH: 6 (ref 5.0–8.0)
Protein, ur: NEGATIVE
Specific Gravity, Urine: 1.023 (ref 1.001–1.035)

## 2015-08-30 LAB — D-DIMER, QUANTITATIVE (NOT AT ARMC): D-Dimer, Quant: 0.29 ug/mL-FEU (ref 0.00–0.48)

## 2015-08-30 LAB — VITAMIN D 25 HYDROXY (VIT D DEFICIENCY, FRACTURES): Vit D, 25-Hydroxy: 28 ng/mL — ABNORMAL LOW (ref 30–100)

## 2015-08-30 LAB — HIV ANTIBODY (ROUTINE TESTING W REFLEX): HIV: NONREACTIVE

## 2015-08-30 LAB — MICROALBUMIN / CREATININE URINE RATIO
CREATININE, URINE: 206 mg/dL (ref 20–320)
Microalb Creat Ratio: 3 mcg/mg creat (ref <30)
Microalb, Ur: 0.6 mg/dL

## 2015-08-30 LAB — HEPATITIS C ANTIBODY: HCV AB: NEGATIVE

## 2015-08-30 LAB — INSULIN, FASTING: INSULIN FASTING, SERUM: 21.9 u[IU]/mL — AB (ref 2.0–19.6)

## 2015-08-30 LAB — FERRITIN: Ferritin: 202 ng/mL (ref 10–291)

## 2015-08-30 LAB — MAGNESIUM: MAGNESIUM: 1.9 mg/dL (ref 1.5–2.5)

## 2015-08-30 LAB — TSH: TSH: 1.257 u[IU]/mL (ref 0.350–4.500)

## 2015-08-31 ENCOUNTER — Other Ambulatory Visit: Payer: Self-pay

## 2015-08-31 LAB — ANTI-DNA ANTIBODY, DOUBLE-STRANDED: ds DNA Ab: <1 IU/mL

## 2015-09-07 ENCOUNTER — Other Ambulatory Visit: Payer: Self-pay

## 2015-09-09 ENCOUNTER — Ambulatory Visit
Admission: RE | Admit: 2015-09-09 | Discharge: 2015-09-09 | Disposition: A | Discharge status: Home / Self Care | Payer: Medicare Other | Source: Ambulatory Visit | Attending: Physician Assistant | Admitting: Physician Assistant

## 2015-09-09 DIAGNOSIS — R222 Localized swelling, mass and lump, trunk: Secondary | ICD-10-CM

## 2015-09-09 DIAGNOSIS — R221 Localized swelling, mass and lump, neck: Secondary | ICD-10-CM | POA: Diagnosis not present

## 2015-09-16 ENCOUNTER — Other Ambulatory Visit: Payer: Self-pay | Admitting: Physician Assistant

## 2015-09-19 ENCOUNTER — Other Ambulatory Visit: Payer: Self-pay | Admitting: Hematology and Oncology

## 2015-09-19 DIAGNOSIS — C50511 Malignant neoplasm of lower-outer quadrant of right female breast: Secondary | ICD-10-CM

## 2015-09-30 ENCOUNTER — Encounter: Payer: Self-pay | Admitting: Cardiovascular Disease

## 2015-09-30 ENCOUNTER — Ambulatory Visit (INDEPENDENT_AMBULATORY_CARE_PROVIDER_SITE_OTHER): Payer: Medicare Other | Admitting: Cardiovascular Disease

## 2015-09-30 VITALS — BP 110/84 | HR 67 | Ht 65.0 in | Wt 176.5 lb

## 2015-09-30 DIAGNOSIS — R0602 Shortness of breath: Secondary | ICD-10-CM

## 2015-09-30 DIAGNOSIS — I493 Ventricular premature depolarization: Secondary | ICD-10-CM | POA: Diagnosis not present

## 2015-09-30 NOTE — Assessment & Plan Note (Signed)
She is concerned that this might be cardiac. However, most recent echocardiogram from last year showed normal LV systolic function and no evidence of pulmonary hypertension. 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 Previous cardiac catheterization in 2011 showed no evidence of pulmonary hypertension and normal coronary arteries. She has no evidence of fluid overload and current baseline EKG is normal. Thus, the chance of her dyspnea being cardiac is extremely low. I suspect that she has other etiology as to cause her to have that much cough and dyspnea with minimal activities. She was treated aggressively for GERD with no improvement in her cough. She continues to feel significant postnasal drip and feels that she is breathing through a straw. I referred her to ENT for evaluation and advised her to keep follow-up with Dr. Melvyn Novas in pulmonary for continued management.

## 2015-09-30 NOTE — Progress Notes (Signed)
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 She was seen by Dr. Haroldine Laws 2011. She was found to have breast CA in 94. Underwent R mastectomy in 1994 with flap reconstructiona and chemotherapy. In 2007 found to have local recurrence treated with XRT and chemo. Developed dyspnea in 2007 and found to have weakened R hemidiaphragm of unclear cause.  Cath 6/11: EF 50-55% with normal coronary arteries and normal right heart pressures.  She has known history of alcohol and benzodiazepine abuse but attended successful inpatient detox program.  Most recent echocardiogram and February 2016 showed normal LV systolic function with an ejection fraction of 123456, grade 1 diastolic dysfunction and no evidence of pulmonary hypertension. She continues to complain of dyspnea with minimal activities and she has been very frustrated with this. This has been associated with severe cough of unclear etiology. She was treated for GERD with no significant improvement. She has significant postnasal drip and some hoarseness. She feels like she is breathing through a straw. No orthopnea or PND.   Allergies  Allergen Reactions  . Ace Inhibitors     cough     Current Outpatient Prescriptions on File Prior to Visit  Medication Sig Dispense Refill  . bisoprolol (ZEBETA) 5 MG tablet Take 1 tablet (5 mg total) by mouth daily. 90 tablet 3  . BOOSTRIX 5-2.5-18.5 injection inject 0.5 milliliter intramuscularly  0  . famotidine (PEPCID) 20 MG tablet Take 20 mg by mouth at bedtime.    Marland Kitchen FLUARIX QUADRIVALENT 0.5 ML injection inject 0.5 milliliter intramuscularly  0  . gabapentin (NEURONTIN) 300 MG capsule TAKE 1-2 CAPSULES BY MOUTH AT BEDTIME 60 capsule 1  . letrozole (FEMARA) 2.5 MG tablet TAKE 1 TABLET BY MOUTH DAILY. 90 tablet 3  . LORazepam (ATIVAN) 2 MG tablet TAKE ONE-HALF TO ONE TABLET BY MOUTH AS  NEEDED AT BEDTIME 30 tablet 3  . pantoprazole (PROTONIX) 40 MG tablet Take 1 tablet (40 mg total) by mouth daily. Take 30-60 min before first meal of the day 30 tablet 2  . PROAIR HFA 108 (90 BASE) MCG/ACT inhaler USE 2 PUFFS BY MOUTH EVERY 6 HOURS AS NEEDED FOR WHEEZING OR SHORTNESSOF BREATH 8.5 each 3  . SYMBICORT 80-4.5 MCG/ACT inhaler INHALE 2 PUFFS EVERY MORNING THEN INHALE 2 PUFFS 12 HOURS LATER 10.2 Inhaler 0   No current facility-administered medications on file prior to visit.     Past Medical History  Diagnosis Date  . Dyspnea     myoview 2011: EF 55% question of  mild reverible anterior defect. thought to be breast attenuation. echo 45-50% with global HK. Grade 1 diastolyic dysfunction. RV nml. cardiac MRI with EF 44% with septal HK. no scar   . Cancer [facility])     breast ca - 1994; recurred in 2007. s/p masectomy with flap rconstruction aand chemo '94. local recurrence on chest wall. chemo and XRT '07. now femara  . Lung disorder   . History of coronary artery stent placement   . Hyperlipidemia   . Hypertension   . Insomnia   . Alcoholism ([place])   . Depression      Past Surgical History  Procedure Laterality Date  . R transflap  '94  . R masectomy  '94   . Mastectomy    . Orif distal radius fracture    . Cardiac [facility]ization  [facility]  . Port-a-cath removal  07/29/2012    Procedure: REMOVAL PORT-A-CATH;  Surgeon: Haywood Lasso, MD;  Location: [place];  Service: General;  Laterality: Left;     Family History  Problem Relation Age of Onset  . Coronary artery disease      family hx  . Hyperlipidemia      family hx  . Thyroid disease      family hx   . Hypertension Mother   . Alcohol abuse Paternal Grandfather   . Alcohol abuse Paternal Uncle   . Heart attack Father   . Alcohol abuse Father   . Cancer Paternal Grandmother     breast  . Alcohol abuse Brother      Social History   Social History  . Marital Status:  Married    Spouse Name: N/A  . Number of Children: N/A  . Years of Education: N/A   Occupational History  . Disabilty    Social History Main Topics  . Smoking status: Never Smoker   . Smokeless tobacco: Never Used     Comment: just socially   . Alcohol Use: Yes     Comment: No alcohol since 05/22/2012  . Drug Use: No  . Sexual Activity: Yes    Birth Control/ Protection: Surgical   Other Topics Concern  . Not on file   Social History Narrative   Suzanne [name] was born in [place] and lived there until high school, when she moved to [place]. She graduated from the [employer] with a bachelor of science in nursing. 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 She has been married for 27 years, and has 2 children, a 19 year old son, and a 22 year old daughter. She denies any legal difficulties. She associates as a Engineer, manufacturing.       PHYSICAL EXAM   BP 110/84 mmHg  Pulse 67  Ht 5\' 5"  (1.651 m)  Wt 176 lb 8 oz (80.06 kg)  BMI 29.37 kg/m2  SpO2 97%  Constitutional: She is oriented to person, place, and time. She appears well-developed and well-nourished. No distress.  HENT: No nasal discharge.  Head: Normocephalic and atraumatic.  Eyes: Pupils are equal and round. Right eye exhibits no discharge. Left eye exhibits no discharge.  Neck: Normal range of motion. Neck supple. No JVD present. No thyromegaly present.  Cardiovascular: Normal rate, regular rhythm, normal heart sounds. Exam reveals no gallop and no friction rub. No murmur heard.  Pulmonary/Chest: Effort normal and breath sounds normal. No stridor. No respiratory distress. She has no wheezes. She has no rales. She exhibits no tenderness.  Abdominal: Soft. Bowel sounds are normal. She exhibits no distension. There is no tenderness. There is no rebound and no guarding.  Musculoskeletal: Normal range of motion. She exhibits no edema and no  tenderness.  Neurological: She is alert and oriented to person, place, and time. Coordination normal.  Skin: Skin is warm and dry. No rash noted. She is not diaphoretic. No erythema. No pallor.  Psychiatric: She has a normal mood and affect. Her behavior is normal. Judgment and thought content normal.    EKG:   normal sinus rhythm with no significant ST or T wave changes.   ASSESSMENT AND PLAN

## 2015-09-30 NOTE — Assessment & Plan Note (Signed)
This has not been an active issue.

## 2015-09-30 NOTE — Patient Instructions (Signed)
Medication Instructions:  Your physician recommends that you continue on your current medications as directed. Please refer to the Current Medication list given to you today.   Labwork: none  Testing/Procedures: none  Follow-Up: Your physician wants you to follow-up in: six months with Dr. Fletcher Anon.  You will receive a reminder letter in the mail two months in advance. If you don't receive a letter, please call our office to schedule the follow-up appointment.   Any Other Special Instructions Will Be Listed Below (If Applicable). You have an appointment with Dr. Lacinda Axon at [facility], [address], [id number], [place]   February 21 at 1:30pm. Please arrive 15 minutes early, bring photo ID and insurance card You have an appointment with Dr. Richardson Landry at [facility] [place] [facility], [address], [place] March 6 at 8:45am. Please arrive at 8:15am.    If you need a refill on your cardiac medications before your next appointment, please call your pharmacy.

## 2015-10-04 ENCOUNTER — Ambulatory Visit: Payer: Self-pay | Admitting: Family Medicine

## 2015-10-17 ENCOUNTER — Other Ambulatory Visit: Payer: Self-pay | Admitting: Physician Assistant

## 2015-10-18 ENCOUNTER — Other Ambulatory Visit: Payer: Self-pay | Admitting: Physician Assistant

## 2015-10-18 MED ORDER — LORAZEPAM 2 MG PO TABS: ORAL_TABLET | ORAL | Status: DC

## 2015-10-18 NOTE — Progress Notes (Signed)
Rx called in to [facility].

## 2015-10-24 DIAGNOSIS — K219 Gastro-esophageal reflux disease without esophagitis: Secondary | ICD-10-CM | POA: Diagnosis not present

## 2015-10-24 DIAGNOSIS — R05 Cough: Secondary | ICD-10-CM | POA: Diagnosis not present

## 2015-10-24 DIAGNOSIS — J301 Allergic rhinitis due to pollen: Secondary | ICD-10-CM | POA: Diagnosis not present

## 2015-10-26 ENCOUNTER — Ambulatory Visit: Payer: Self-pay | Admitting: Family Medicine

## 2015-10-31 DIAGNOSIS — H04123 Dry eye syndrome of bilateral lacrimal glands: Secondary | ICD-10-CM | POA: Diagnosis not present

## 2015-10-31 DIAGNOSIS — H43811 Vitreous degeneration, right eye: Secondary | ICD-10-CM | POA: Diagnosis not present

## 2015-11-22 ENCOUNTER — Other Ambulatory Visit: Payer: Self-pay | Admitting: Internal Medicine

## 2015-11-24 ENCOUNTER — Encounter: Payer: Self-pay | Admitting: Physician Assistant

## 2015-11-24 ENCOUNTER — Ambulatory Visit (INDEPENDENT_AMBULATORY_CARE_PROVIDER_SITE_OTHER): Payer: Medicare Other | Admitting: Physician Assistant

## 2015-11-24 VITALS — BP 110/80 | HR 96 | Temp 97.5°F | Resp 16 | Ht 65.0 in | Wt 174.8 lb

## 2015-11-24 DIAGNOSIS — C50511 Malignant neoplasm of lower-outer quadrant of right female breast: Secondary | ICD-10-CM

## 2015-11-24 DIAGNOSIS — R0600 Dyspnea, unspecified: Secondary | ICD-10-CM | POA: Diagnosis not present

## 2015-11-24 NOTE — Progress Notes (Signed)
Subjective:    Patient ID: Suzanne [name], female    DOB: 01/18/1956, 60 y.o.   MRN: [id number]  HPI 60 y.o. WF with history of alcohol and benzo abuse but has done in patient detox, breast cancer, shortness of breath, is following with Dr. Rogue Jury in cardio, Dr. Lindi Adie in oncology and Dr. Melvyn Novas for pulmunary presents for paperwork to be filled out for [facility]. Since her prior cancer she has had some mild shortness of breath that has gotten increasing worse over the last year making it difficult for her to be able to perform daily tasks as well as work.   Blood pressure 110/80, pulse 96, temperature 97.5 F (36.4 C), temperature source Temporal, resp. rate 16, height 5\' 5"  (1.651 m), weight 174 lb 12.8 oz (79.289 kg), SpO2 95 %.  Past Medical History  Diagnosis Date  . Dyspnea     myoview 2011: EF 55% question of  mild reverible anterior defect. thought to be breast attenuation. echo 45-50% with global HK. Grade 1 diastolyic dysfunction. RV nml. cardiac MRI with EF 44% with septal HK. no scar   . Cancer [facility])     breast ca - 1994; recurred in 2007. s/p masectomy with flap rconstruction aand chemo '94. local recurrence on chest wall. chemo and XRT '07. now femara  . Lung disorder   . History of coronary artery stent placement   . Hyperlipidemia   . Hypertension   . Insomnia   . Alcoholism ([place])   . Depression    Current Outpatient Prescriptions on File Prior to Visit  Medication Sig Dispense Refill  . bisoprolol (ZEBETA) 5 MG tablet Take 1 tablet (5 mg total) by mouth daily. 90 tablet 3  . BOOSTRIX 5-2.5-18.5 injection inject 0.5 milliliter intramuscularly  0  . famotidine (PEPCID) 20 MG tablet Take 20 mg by mouth at bedtime.    Marland Kitchen FLUARIX QUADRIVALENT 0.5 ML injection inject 0.5 milliliter intramuscularly  0  . gabapentin (NEURONTIN) 300 MG capsule TAKE 1-2 CAPSULES BY MOUTH AT BEDTIME 60 capsule 2  . letrozole (FEMARA) 2.5 MG tablet TAKE 1 TABLET BY MOUTH DAILY. 90 tablet 3  . LORazepam  (ATIVAN) 2 MG tablet TAKE ONE-HALF TO ONE TABLET BY MOUTH AS NEEDED AT BEDTIME 30 tablet 1  . pantoprazole (PROTONIX) 40 MG tablet Take 1 tablet (40 mg total) by mouth daily. Take 30-60 min before first meal of the day 30 tablet 2  . PROAIR HFA 108 (90 BASE) MCG/ACT inhaler USE 2 PUFFS BY MOUTH EVERY 6 HOURS AS NEEDED FOR WHEEZING OR SHORTNESSOF BREATH 8.5 each 3  . SYMBICORT 80-4.5 MCG/ACT inhaler INHALE 2 PUFFS EVERY MORNING THEN INHALE 2 PUFFS 12 HOURS LATER 10.2 Inhaler 0   No current facility-administered medications on file prior to visit.    Review of Systems  Constitutional: Negative for fever, chills and diaphoresis.  HENT: Negative.   Eyes: Negative.   Respiratory: Positive for cough, shortness of breath and wheezing.   Cardiovascular: Negative for chest pain, palpitations and leg swelling.  Gastrointestinal: Negative.   Genitourinary: Negative.   Musculoskeletal: Negative for myalgias, back pain and neck pain.  Skin: Negative.  Negative for rash.  Neurological: Negative.  Negative for dizziness and weakness.  Psychiatric/Behavioral: Negative for suicidal ideas and hallucinations. The patient is nervous/anxious.   All other systems reviewed and are negative.      Objective:   Physical Exam  Constitutional: She is oriented to person, place, and time. She appears well-developed and well-nourished.  HENT:  Head: Normocephalic and atraumatic.  Eyes: Conjunctivae are normal.  Neck: Normal range of motion.  Cardiovascular: Normal rate, regular rhythm, normal heart sounds and intact distal pulses.   Pulmonary/Chest: Effort normal and breath sounds normal.  Abdominal: Soft. Bowel sounds are normal. She exhibits no distension and no mass. There is no tenderness. There is no rebound and no guarding.  Musculoskeletal: Normal range of motion.  Pain with change in positions  Neurological: She is alert and oriented to person, place, and time.  Skin: Skin is warm and dry.   Psychiatric: She has a normal mood and affect. Judgment normal.  Unable to sit still  Nursing note and vitals reviewed.       Assessment & Plan:  1. Dyspnea Continue follow up with pulmonary and cardio, has repeat CT chest planned, may benefit from CTA? Per patient request will refer to allergist - Ambulatory referral to Allergy  2. Breast cancer of lower-outer quadrant of right female breast ([place]) Filled out FMLA papers

## 2015-12-08 ENCOUNTER — Other Ambulatory Visit: Payer: Self-pay | Admitting: Internal Medicine

## 2015-12-08 ENCOUNTER — Other Ambulatory Visit: Payer: Self-pay | Admitting: Cardiovascular Disease

## 2015-12-08 DIAGNOSIS — G4700 Insomnia, unspecified: Secondary | ICD-10-CM

## 2015-12-08 MED ORDER — LORAZEPAM 2 MG PO TABS: ORAL_TABLET | ORAL | Status: AC

## 2015-12-12 NOTE — Progress Notes (Signed)
Rx called in to [facility].

## 2015-12-15 ENCOUNTER — Other Ambulatory Visit: Payer: Self-pay | Admitting: Hematology and Oncology

## 2015-12-16 ENCOUNTER — Other Ambulatory Visit: Payer: Self-pay | Admitting: *Deleted

## 2015-12-16 DIAGNOSIS — R0602 Shortness of breath: Secondary | ICD-10-CM

## 2015-12-16 MED ORDER — ALBUTEROL SULFATE HFA 108 (90 BASE) MCG/ACT IN AERS: INHALATION_SPRAY | RESPIRATORY_TRACT | Status: DC

## 2016-01-16 DIAGNOSIS — H524 Presbyopia: Secondary | ICD-10-CM | POA: Diagnosis not present

## 2016-01-16 DIAGNOSIS — H5213 Myopia, bilateral: Secondary | ICD-10-CM | POA: Diagnosis not present

## 2016-01-19 ENCOUNTER — Ambulatory Visit: Payer: Self-pay | Admitting: Physician Assistant

## 2016-02-10 ENCOUNTER — Telehealth: Payer: Self-pay | Admitting: *Deleted

## 2016-02-10 ENCOUNTER — Other Ambulatory Visit: Payer: Self-pay | Admitting: *Deleted

## 2016-02-10 NOTE — Telephone Encounter (Signed)
TC from patient stating that she is experiencing worsening of her shortness of breath. Pt has a history of SOB and has seen pulmonologist, cardiologist etc. She has returned from vacation and states thatis has gotten worse. Coughing up thick white secretions.  Patient is asking if she can see Dr. Lindi Adie

## 2016-02-10 NOTE — Telephone Encounter (Signed)
Discussed with Dr. Lindi Adie, to be seen by Selena Lesser in symptom management. Called patient but she stated that "she can wait, it's nothing new". Patient to be seen on 7/6 at 3:45. pof sent and patient aware of appt.

## 2016-02-16 ENCOUNTER — Ambulatory Visit: Payer: Self-pay | Admitting: Hematology and Oncology
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
## 2016-03-01 ENCOUNTER — Ambulatory Visit: Payer: Self-pay | Admitting: Physician Assistant

## 2016-03-20 ENCOUNTER — Ambulatory Visit (INDEPENDENT_AMBULATORY_CARE_PROVIDER_SITE_OTHER)
Admission: RE | Admit: 2016-03-20 | Discharge: 2016-03-20 | Disposition: A | Discharge status: Home / Self Care | Payer: Medicare Other | Source: Ambulatory Visit | Attending: Internal Medicine | Admitting: Internal Medicine

## 2016-03-20 ENCOUNTER — Other Ambulatory Visit (INDEPENDENT_AMBULATORY_CARE_PROVIDER_SITE_OTHER): Payer: Medicare Other

## 2016-03-20 ENCOUNTER — Ambulatory Visit (INDEPENDENT_AMBULATORY_CARE_PROVIDER_SITE_OTHER): Payer: Medicare Other | Admitting: Internal Medicine

## 2016-03-20 ENCOUNTER — Encounter: Payer: Self-pay | Admitting: Internal Medicine

## 2016-03-20 ENCOUNTER — Encounter (INDEPENDENT_AMBULATORY_CARE_PROVIDER_SITE_OTHER): Payer: Self-pay

## 2016-03-20 VITALS — BP 110/74 | HR 75 | Temp 98.5°F | Ht 65.0 in | Wt 175.8 lb

## 2016-03-20 DIAGNOSIS — R0602 Shortness of breath: Secondary | ICD-10-CM

## 2016-03-20 DIAGNOSIS — J45991 Cough variant asthma: Secondary | ICD-10-CM | POA: Diagnosis not present

## 2016-03-20 LAB — CBC WITH DIFFERENTIAL/PLATELET
Basophils Absolute: 0 10*3/uL (ref 0.0–0.1)
Basophils Relative: 0.3 % (ref 0.0–3.0)
EOS PCT: 1.1 % (ref 0.0–5.0)
Eosinophils Absolute: 0.1 10*3/uL (ref 0.0–0.7)
HEMATOCRIT: 42.2 % (ref 36.0–46.0)
Hemoglobin: 14.6 g/dL (ref 12.0–15.0)
LYMPHS ABS: 1.5 10*3/uL (ref 0.7–4.0)
LYMPHS PCT: 27.7 % (ref 12.0–46.0)
MCHC: 34.6 g/dL (ref 30.0–36.0)
MCV: 100.1 fl — AB (ref 78.0–100.0)
MONOS PCT: 11.4 % (ref 3.0–12.0)
Monocytes Absolute: 0.6 10*3/uL (ref 0.1–1.0)
NEUTROS ABS: 3.3 10*3/uL (ref 1.4–7.7)
NEUTROS PCT: 59.5 % (ref 43.0–77.0)
PLATELETS: 247 10*3/uL (ref 150.0–400.0)
RBC: 4.22 Mil/uL (ref 3.87–5.11)
RDW: 13.1 % (ref 11.5–15.5)
WBC: 5.6 10*3/uL (ref 4.0–10.5)

## 2016-03-20 LAB — SEDIMENTATION RATE: Sed Rate: 10 mm/hr (ref 0–30)

## 2016-03-20 LAB — BRAIN NATRIURETIC PEPTIDE: Pro B Natriuretic peptide (BNP): 19 pg/mL (ref 0.0–100.0)

## 2016-03-20 LAB — BASIC METABOLIC PANEL
BUN: 9 mg/dL (ref 6–23)
CHLORIDE: 100 meq/L (ref 96–112)
CO2: 30 mEq/L (ref 19–32)
Calcium: 9.3 mg/dL (ref 8.4–10.5)
Creatinine, Ser: 0.7 mg/dL (ref 0.40–1.20)
GFR: 91.13 mL/min (ref >60.00)
GLUCOSE: 124 mg/dL — AB (ref 70–99)
POTASSIUM: 3.7 meq/L (ref 3.5–5.1)
SODIUM: 138 meq/L (ref 135–145)

## 2016-03-20 LAB — TSH: TSH: 0.98 u[IU]/mL (ref 0.35–4.50)

## 2016-03-20 LAB — NITRIC OXIDE: NITRIC OXIDE: 10

## 2016-03-20 MED ORDER — CODEINE SULFATE 30 MG PO TABS: ORAL_TABLET | ORAL | 0 refills | Status: DC

## 2016-03-20 MED ORDER — PREDNISONE 10 MG PO TABS: ORAL_TABLET | ORAL | 0 refills | Status: DC

## 2016-03-20 MED ORDER — PANTOPRAZOLE SODIUM 40 MG PO TBEC: 40.0000 mg | DELAYED_RELEASE_TABLET | Freq: Every day | ORAL | 2 refills | Status: DC

## 2016-03-20 MED ORDER — GABAPENTIN 300 MG PO CAPS: ORAL_CAPSULE | ORAL | 2 refills | Status: DC

## 2016-03-20 NOTE — Patient Instructions (Addendum)
Please remember to go to the lab and x-ray department downstairs for your tests - we will call you with the results when they are available.  Change gabapentin 300 mg twice daily, not as needed       Prednisone 10 mg take  4 each am x 2 days,   2 each am x 2 days,  1 each am x 2 days and stop   Only use your albuterol as a rescue medication to be used if you can't catch your breath by resting or doing a relaxed purse lip breathing pattern.  - The less you use it, the better it will work when you need it. - Ok to use up to 2 puffs  every 4 hours if you must but call for immediate appointment if use goes up over your usual need - Don't leave home without it !!  (think of it like the spare tire for your car)   Take delsym two tsp every 12 hours and supplement if needed with  Codeine 30  mg up to 2 every 4 hours to suppress the urge to cough. Swallowing water or using ice chips/non mint and menthol containing candies (such as lifesavers or sugarless jolly ranchers) are also effective.  You should rest your voice and avoid activities that you know make you cough.  Once you have eliminated the cough for 3 straight days try reducing the tylenol #3  first,  then the delsym as tolerated.    Start  pantoprazole 40 mg  Take 30-60 min before first meal of the day and Pepcid (famotidine) 20 mg one bedtime until cough is completely gone for at least a week without the need for cough suppression  Please schedule a follow up office visit in 2 weeks, sooner if needed with all meds in hand

## 2016-03-20 NOTE — Assessment & Plan Note (Addendum)
- 03/02/2014  pfts VC  1.48 s obst, dlco 78 corrects to 154 and ERV 16%  - 02/10/2015 p extensive coaching HFA effectiveness =  90% - sinus CT 04/27/15 > No evidence of sinusitis. - FENO 03/20/2016  =  10  - Allergy profile 03/20/2016 >  Eos 0. /  IgE   - 03/20/2016  After extensive coaching HFA effectiveness =    90% but immediate severe coughing fits produced  The most common causes of chronic cough in immunocompetent adults include the following: upper airway cough syndrome (UACS), previously referred to as postnasal drip syndrome (PNDS), which is caused by variety of rhinosinus conditions; (2) asthma; (3) GERD; (4) chronic bronchitis from cigarette smoking or other inhaled environmental irritants; (5) nonasthmatic eosinophilic bronchitis; and (6) bronchiectasis.   These conditions, singly or in combination, have accounted for up to 94% of the causes of chronic cough in prospective studies.   Other conditions have constituted no >6% of the causes in prospective studies These have included bronchogenic carcinoma, chronic interstitial pneumonia, sarcoidosis, left ventricular failure, ACEI-induced cough, and aspiration from a condition associated with pharyngeal dysfunction.    Chronic cough is often simultaneously caused by more than one condition. A single cause has been found from 38 to 82% of the time, multiple causes from 18 to 62%. Multiply caused cough has been the result of three diseases up to 42% of the time.       This is most likely not cough variant asthma based on prev nl pfts while symptomatic and nl feno today with prominent upper airway features on exam so favor  Classic Upper airway cough syndrome, so named because it's frequently impossible to sort out how much is  CR/sinusitis with freq throat clearing (which can be related to primary GERD)   vs  causing  secondary (" extra esophageal")  GERD from wide swings in gastric pressure that occur with throat clearing, often  promoting self use  of mint and menthol lozenges that reduce the lower esophageal sphincter tone and exacerbate the problem further in a cyclical fashion.   These are the same pts (now being labeled as having "irritable larynx syndrome" by some cough centers) who not infrequently have a history of having failed to tolerate ace inhibitors(as is the case here) ,  dry powder inhalers or biphosphonates or report having atypical reflux symptoms that don't respond to standard doses of PPI , and are easily confused as having aecopd or asthma flares by even experienced allergists/ pulmonologists.   Of the three most common causes of chronic cough, only one (GERD)  can actually cause the other two (asthma and post nasal drip syndrome)  and perpetuate the cylce of cough inducing airway trauma, inflammation, heightened sensitivity to reflux which is prompted by the cough itself via a cyclical mechanism.    This may partially respond to steroids and look like asthma and post nasal drainage but never erradicated completely unless the cough and the secondary reflux are eliminated, preferably both at the same time.  While not intuitively obvious, many patients with chronic low grade reflux do not cough until there is a secondary insult that disturbs the protective epithelial barrier and exposes sensitive nerve endings.  This can be viral or direct physical injury such as with an endotracheal tube.   The point is that once this occurs, it is difficult to eliminate using anything but a maximally effective acid suppression regimen at least in the short run, accompanied by an appropriate  diet to address non acid GERD.   For now rec w/u for allergy/ HSP and proceed with gerd rx/change neurontin to 300 bid and rx with  cyclical cough protocol and leave off all inhalers x for emergency saba as clearly even it aggravates the uacs component   Advised pt: The standardized cough guidelines published in Chest by Lissa Morales in 2006 are still the  best available and consist of a multiple step process (up to 12!) , not a single office visit,  and are intended  to address this problem logically,  with an alogrithm dependent on response to empiric treatment at  each progressive step  to determine a specific diagnosis with  minimal addtional testing needed. Therefore if adherence is an issue or can't be accurately verified,  it's very unlikely the standard evaluation and treatment will be successful here.    Furthermore, response to therapy (other than acute cough suppression, which should only be used short term with avoidance of narcotic containing cough syrups if possible), can be a gradual process for which the patient is not likely to  perceive immediate benefit.    F/u q 2 weeks   Total time devoted to counseling  = 25/78m review case with pt/ discussion of options/alternatives/ personally creating written instructions  in presence of pt  then going over those specific  Instructions directly with the pt including how to use all of the meds but in particular covering each new medication in detail and the difference between the maintenance/automatic meds and the prns using an action plan format for the latter.

## 2016-03-20 NOTE — Progress Notes (Signed)
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 Advice Only    Was seeing Dr. Patsey Berthold for SOB, has increased in the past several months.    noted decreased activity tol with 30 lb wt gain and now sometimes breathing uncomfortable sitting still, stops just walking across her yard, back from mailbox stopping no real change p saba and worse x sev months, coughs in am so hard vomits, produces white mucus but doesn't wake up but starts when gets to BR p stirring. rec Bisoprolol 5mg  each am in place of coreg to see what effect this has on your exercise tolerance  Pepcid ac 20 mg at bedtime plus chlortrimeton 4mg  at bedtime Incentive spirometry and 30 min of aerobic exercise daily - short of breath not out of breath   03/02/2014 f/u ov/Zebastian Carico re:  ? Asthma  Chief Complaint  Patient presents with  . Followup with cxr and pft's    Pt states that her breathing has been worse for the past 3-4 days. She states that she is SOB with any exertion.   >> Symbicort rx    6/23/2016NP acute office visit  /  Patient presents for an acute office visit She complains of a productive cough with thick, creamy congestion, sinus drainage, wheezing for 1 week. She took some leftover prednisone over the last 4 days. About 10-20 mg daily. She also started on Bactrim over the last 4 days-this was a leftover anabiotic. rec Levaquin 500 mg daily for 7 days, take with food Prednisone taper over week.  Mucinex DM twice daily as needed for cough and congestion.   02/10/2015 f/u  ov/[name] re: on symbicort 80 2bid but not maintaining it between flares  and no rescue/ not on gerd rx Chief Complaint  Patient presents with  . Acute Visit    Pt states cough not improving very much since last visit 02/03/15- prod with large amounts of white sputum. Her wheezing is also not resolved, but it is some better. She has occ SOB. She is using albuterol at least 3 x per day.   for months stuffy nose and excess mucus then severe coughing fits developed  rec Please see patient coordinator before you leave today  to schedule CT of sinuses and if neg try For drainage take chlortrimeton (chlorpheniramine) 4 mg every 4 hours available over the counter (may cause drowsiness)  Avoid bird exposure  Prednisone 10 mg take  4 each am x 2 days,   2 each am x 2 days,  1 each am x 2 days and stop  Stay on symbicort 80 Take 2 puffs first thing in am and then another 2 puffs about 12 hours later.  Only use your albuterol as a rescue medication  Take delsym two tsp every 12 hours and supplement if needed with  tramadol 50 mg up to 2 every 4 hours to suppress the urge to cough. Swallowing water or using ice chips/non mint and menthol containing  candies (such as lifesavers or sugarless jolly ranchers) are also effective.  You should rest your voice and avoid activities that you know make you cough. Once you have eliminated the cough for 3 straight days try reducing the tramadol first,  then the delsym as tolerated.   Try pantoprazole 40 mg  Take 30-60 min before first meal of the day and Pepcid (famotidine) 20 mg one bedtime until cough is completely gone for at least a week without the need for cough suppression  10/24/15 ENT [facility]:  Dx allergic rhinitis/ gerd rec dexilant/ allegra d/allergy eval > not done    03/20/2016  Acute exteneded  ov/[name] re: refractory cough / R D hemiparesis  Chief Complaint  Patient presents with  . Acute Visit    Pt states had coughed up some bright red sputum  yesterday. She is not coughing anymore that usual "same old cough".  She also c/o "extreme SOB"- steadily increasing since her last visit here over a year ago. She states she is SOB while lying down but only if she lies on her right side.   Since last ov gradually worse can only lie down with R side down otherwise immediate smothering on back or L side down  Cough daily p stirs around and is quite harsh  But not productive   When albuterol helps cough some p 10 min and lasts sev hours and uses up to 3 x daily  While on symbicort much less need for albuterol but still daily cough/ last used it sev months prior to OV   Coughs so hard she vomits and this takes her breath away otherwise no resting sob/ wheeze can be heard across the room by others. Had ent eval [facility] rec allergy eval.   No obvious day to day or daytime variabilty or assoc excess or purulent sputum  or cp or chest tightness, subjective wheeze or overt   hb symptoms. No unusual exp hx or h/o childhood pna/ asthma or knowledge of premature birth.  Sleeping ok without nocturnal  or early am exacerbation  of respiratory  c/o's or need for noct saba. Also denies any obvious fluctuation of symptoms with weather or environmental changes or other aggravating or alleviating factors except as outlined above   Current Medications, Allergies, Complete Past Medical History, Past Surgical History, Family History, and Social History were reviewed in [employer] record.  ROS  The following are not active complaints unless bolded sore throat, dysphagia, dental problems, itching, sneezing,  nasal congestion or excess/ purulent secretions, ear ache,   fever, chills, sweats, unintended wt loss, pleuritic or exertional cp, hemoptysis,  orthopnea pnd or leg swelling, presyncope, palpitations, abdominal pain, anorexia, nausea, vomiting, diarrhea  or change in bowel or urinary habits, change in stools or urine, dysuria,hematuria,   rash, arthralgias, visual complaints, headache, numbness weakness or ataxia or problems with walking or coordination,  change in mood/affect or memory.          Objective:   Physical Exam   amb wf nad very prominent pseudowheezing   03/02/2014        169   > 173. 02/03/2015 > 02/10/2015 176 > 03/20/2016  176  Vital signs reviewed   HEENT: nl dentition, turbinates, and orophanx. Nl external ear canals without cough reflex   NECK :  without JVD/Nodes/TM/ nl carotid upstrokes bilaterally   LUNGS: no acc muscle use,   Lots of transmitted upper airway wheezing   CV:  RRR  no s3 or murmur or increase in P2, no edema   ABD:  soft and nontender with nl excursion in the supine position. No bruits or organomegaly, bowel sounds nl  MS:  warm without deformities, calf tenderness, cyanosis or clubbing  SKIN: warm and dry without lesions    NEURO:  alert, approp, no deficits       CXR PA and Lateral:   03/20/2016 :    I personally reviewed images and agree with radiology impression as follows:   Chronic changes without acute abnormality  cxr review: Between 12/2005 and 06/2006 progressive R lung atx and elevated hd and 07/02/2006 sluggish R hd on fluoro but no paradox             Assessment & Plan:

## 2016-03-21 ENCOUNTER — Ambulatory Visit (INDEPENDENT_AMBULATORY_CARE_PROVIDER_SITE_OTHER): Payer: Medicare Other | Admitting: Physician Assistant

## 2016-03-21 ENCOUNTER — Encounter: Payer: Self-pay | Admitting: Physician Assistant

## 2016-03-21 ENCOUNTER — Telehealth: Payer: Self-pay | Admitting: Internal Medicine

## 2016-03-21 ENCOUNTER — Telehealth: Payer: Self-pay | Admitting: Pulmonary Disease

## 2016-03-21 VITALS — BP 116/80 | HR 61 | Temp 97.7°F | Resp 16 | Ht 65.0 in | Wt 177.0 lb

## 2016-03-21 DIAGNOSIS — I493 Ventricular premature depolarization: Secondary | ICD-10-CM

## 2016-03-21 DIAGNOSIS — I519 Heart disease, unspecified: Secondary | ICD-10-CM | POA: Diagnosis not present

## 2016-03-21 DIAGNOSIS — K2100 Gastro-esophageal reflux disease with esophagitis, without bleeding: Secondary | ICD-10-CM

## 2016-03-21 DIAGNOSIS — R0602 Shortness of breath: Secondary | ICD-10-CM

## 2016-03-21 DIAGNOSIS — J45991 Cough variant asthma: Secondary | ICD-10-CM

## 2016-03-21 DIAGNOSIS — E669 Obesity, unspecified: Secondary | ICD-10-CM

## 2016-03-21 DIAGNOSIS — C50511 Malignant neoplasm of lower-outer quadrant of right female breast: Secondary | ICD-10-CM | POA: Diagnosis not present

## 2016-03-21 DIAGNOSIS — F1320 Sedative, hypnotic or anxiolytic dependence, uncomplicated: Secondary | ICD-10-CM | POA: Diagnosis not present

## 2016-03-21 DIAGNOSIS — G4700 Insomnia, unspecified: Secondary | ICD-10-CM | POA: Diagnosis not present

## 2016-03-21 DIAGNOSIS — R7303 Prediabetes: Secondary | ICD-10-CM | POA: Diagnosis not present

## 2016-03-21 DIAGNOSIS — R918 Other nonspecific abnormal finding of lung field: Secondary | ICD-10-CM

## 2016-03-21 DIAGNOSIS — F1010 Alcohol abuse, uncomplicated: Secondary | ICD-10-CM | POA: Diagnosis not present

## 2016-03-21 DIAGNOSIS — R6889 Other general symptoms and signs: Secondary | ICD-10-CM | POA: Diagnosis not present

## 2016-03-21 DIAGNOSIS — M25512 Pain in left shoulder: Secondary | ICD-10-CM

## 2016-03-21 DIAGNOSIS — Z0001 Encounter for general adult medical examination with abnormal findings: Secondary | ICD-10-CM | POA: Diagnosis not present

## 2016-03-21 DIAGNOSIS — I10 Essential (primary) hypertension: Secondary | ICD-10-CM | POA: Diagnosis not present

## 2016-03-21 DIAGNOSIS — G2581 Restless legs syndrome: Secondary | ICD-10-CM

## 2016-03-21 LAB — RESPIRATORY ALLERGY PROFILE REGION II ~~LOC~~
Allergen, Comm Silver Birch, t9: <0.1 kU/L
Allergen, Mulberry, t76: <0.1 kU/L
Allergen, Oak,t7: <0.1 kU/L
Box Elder IgE: <0.1 kU/L
Cat Dander: 0.11 kU/L — ABNORMAL HIGH
Dog Dander: 0.21 kU/L — ABNORMAL HIGH
Elm IgE: <0.1 kU/L
IgE (Immunoglobulin E), Serum: 99 kU/L (ref <115)
Johnson Grass: <0.1 kU/L
Sheep Sorrel IgE: <0.1 kU/L

## 2016-03-21 MED ORDER — CYCLOBENZAPRINE HCL 10 MG PO TABS: 10.0000 mg | ORAL_TABLET | Freq: Three times a day (TID) | ORAL | 0 refills | Status: DC | PRN

## 2016-03-21 NOTE — Patient Instructions (Signed)

## 2016-03-21 NOTE — Progress Notes (Signed)
MEDICARE ANNUAL WELLNESS VISIT AND ACUTE VISIT  Assessment:   Essential hypertension - continue medications, DASH diet, exercise and monitor at home. Call if greater than 130/80.   Benzodiazepine dependence Will monitor very closely  Alcohol abuse Long discussion about alcohol use, risk such as liver failure, heart failure, cancer.    Breast cancer of lower-outer quadrant of right female breast Continue follow up   Prediabetes Discussed general issues about diabetes pathophysiology and management., Educational material distributed., Suggested low cholesterol diet., Encouraged aerobic exercise., Discussed foot care., Reminded to get yearly retinal exam.  Systolic dysfunction Improved since stopped drinking versus chemo- continue follow up with cardio Control blood pressure, cholesterol, glucose, increase exercise.   PVC (premature ventricular contraction) Continue Ziac  Gastroesophageal reflux disease with esophagitis Continue PPI/H2 blocker, diet discussed  Cough Continue follow up Dr. Melvyn Novas, likely mutlifactorial  DYSPNEA Continue follow up Dr. Melvyn Novas, weight loss advised, continue GERD meds   Obesity Obesity with co morbidities- long discussion about weight loss, diet, and exercise  Insomnia Will refill ativan, patient understands that we will be monitoring very closely.   RLS (restless legs syndrome) - gabapentin (NEURONTIN) 300 MG capsule; 1-2 at night for sleep/pain  Dispense: 60 capsule; Refill: 2   Hyperlipidemia -continue medications, check lipids, decrease fatty foods, increase activity.  - Lipid panel  Vitamin D deficiency - Vit D  25 hydroxy (rtn osteoporosis monitoring)  Left shoulder pain Bicep tendon/impingement/carpal tunnel Continue prednisone from Dr. Melvyn Novas, muscle relaxer for sleep, will refer to ortho for injection. Wear carpal tunnel brace.      Plan:   During the course of the visit the patient was educated and counseled about appropriate  screening and preventive services including:    Pneumococcal vaccine   Influenza vaccine  Td vaccine  Screening electrocardiogram  Bone densitometry screening  Colorectal cancer screening  Diabetes screening  Glaucoma screening  Nutrition counseling   Advanced directives: requested   Subjective:  Suzanne [name] is a 60 y.o. female who presents for Medicare Annual Wellness Visit and arm pain x 6 months.   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 Has constant tooth ache with occ throbbing pain pain shoulder down to her elbow, worse with different positions, worse with arm being down, still weakness with lifting things. Ice helps   Her blood pressure has been controlled at home, today their BP is BP: 116/80 She does workout. She denies chest pain, shortness of breath, dizziness.  She is not on cholesterol medication and denies myalgias. Her cholesterol is not at goal. The cholesterol last visit was: LDL 133.    She does have a history of alcoholism, she states that she has not been drinking since Jan. She states that her husband is not supportive and does not understand that alcohol is an addiction for her.  Lab Results  Component Value Date   ALT 75 (H) 08/29/2015   AST 45 (H) 08/29/2015   ALKPHOS 61 08/29/2015   BILITOT 0.6 08/29/2015   She has been having a cough, has seen Dr. Melvyn Novas and from PFTs he is thinking it is from GERD, she is on albuterol and symbicort but does not have asthma/COPD history.  She has GERD and is on protonix.  She is on ativan 2mg , takes 1/2  for  sleep/RLS, does not take it with alcohol, states it helps. She was on neurotin in the past which helped, she would like a refill to try to use rather than the gabapentin. She could not tolerate the Requip.   She has a history of  right breast cancer, still on femara and is now following with Dr. Lindi Adie. Has new nodules in her lungs, she had negative PET scan and is getting another scan in Feb with possible biopsy.   She has been working on diet and exercise for prediabetes, and denies paresthesia of the feet, polydipsia, polyuria and visual disturbances. Last A1C in the office was: 5.7 Patient is on Vitamin D supplement.   Lab Results  Component Value Date   VD25OH 28 (L) 08/29/2015  BMI is Body mass index is 29.45 kg/m., she is working on diet and exercise. Wt Readings from Last 3 Encounters:  03/21/16 177 lb (80.3 kg)  03/20/16 175 lb 12.8 oz (79.7 kg)  11/24/15 174 lb 12.8 oz (79.3 kg)   Names of Other Physician/Practitioners you currently use: 1. [facility] here for primary care 2. Dr. Gershon Crane, eye doctor, last visit 01/2016 3. Dr. Carman Ching, dentist, last visit 08/2015 Patient Care Team: Unk Pinto, MD as PCP - General (Internal Medicine) Ronald Lobo, MD as Consulting Physician (Gastroenterology) Tanda Rockers, MD as Consulting Physician (Pulmonary Disease) Nicholas Lose, MD as Consulting Physician (Hematology and Oncology) Jolaine Artist, MD as Consulting Physician (Cardiology)   Medication Review: Current Outpatient Prescriptions on File Prior to Visit  Medication Sig Dispense Refill  . albuterol (PROAIR HFA) 108 (90 Base) MCG/ACT inhaler USE 2 PUFFS BY MOUTH EVERY 6 HOURS AS NEEDED FOR WHEEZING OR SHORTNESSOF BREATH 8.5 each 3  . bisoprolol (ZEBETA) 5 MG tablet TAKE 1 TABLET BY MOUTH DAILY. 90 tablet 3  . codeine 30 MG tablet Up 2 every 4 hours as needed for cough 60 tablet 0  . famotidine (PEPCID) 20 MG tablet Take 20 mg by mouth at bedtime.    . gabapentin (NEURONTIN) 300 MG capsule One twice daily 60 capsule 2  . ibuprofen (ADVIL,MOTRIN) 200 MG tablet Take 200 mg by mouth every 6 (six) hours as needed.    Marland Kitchen letrozole (FEMARA) 2.5 MG tablet TAKE 1  TABLET BY MOUTH DAILY. 90 tablet 3  . LORazepam (ATIVAN) 2 MG tablet TAKE ONE-HALF TO ONE TABLET BY MOUTH AS NEEDED AT BEDTIME 30 tablet 5  . pantoprazole (PROTONIX) 40 MG tablet Take 1 tablet (40 mg total) by mouth daily. Take 30-60 min before first meal of the day 30 tablet 2  . predniSONE (DELTASONE) 10 MG tablet Take  4 each am x 2 days,   2 each am x 2 days,  1 each am x 2 days and stop 14 tablet 0   No current facility-administered medications on file prior to visit.     Current Problems (verified) Patient Active Problem List   Diagnosis Date Noted  . Cough variant asthma 02/10/2015  . Prediabetes 08/26/2014  . Systolic dysfunction [id number]  . GERD (gastroesophageal reflux disease) 08/26/2014  . Obesity 08/26/2014  . Lung nodules 08/26/2014  . Insomnia 08/26/2014  . RLS (restless legs syndrome) 08/26/2014  . Breast cancer of lower-outer quadrant of right female breast ([place]) 07/15/2012  . Essential hypertension   . Alcohol abuse 05/24/2012  . Benzodiazepine dependence ([place]) 05/24/2012    Class: Chronic  . PVC (premature ventricular contraction) 11/29/2009  . DYSPNEA 08/15/2007  Screening Tests Immunization History  Administered Date(s) Administered  . DTaP 05/24/2015  . Influenza Split 05/28/2013  . Influenza Whole 08/21/2007  . Influenza,inj,Quad PF,36+ Mos 05/13/2014  . Influenza-Unspecified 05/24/2015  . Pneumococcal Conjugate-13 08/23/2014  . Pneumococcal Polysaccharide-23 08/21/2007  . Td 05/14/2012  . Zoster 05/16/2011   Preventative care: Last colonoscopy: 2012 due 10 years Last mammogram: 02/2015 Last pap smear/pelvic exam: remote declines another  DEXA 12/2012 due  PFT 02/2014 CT chest 07/2014 LL nodule increasing in size BUT PET 07/2014 negative- suggest continued CT  Echo 05/2012 50-55%  Prior vaccinations: TD or Tdap: 2013  Influenza: 2015  Pneumococcal: 2009 Prevnar13: 2016 Shingles/Zostavax: 2012  Allergies Allergies  Allergen  Reactions  . Ace Inhibitors     cough    SURGICAL HISTORY She  has a past surgical history that includes R transflap ('94); R masectomy ('94 ); Mastectomy; ORIF distal radius fracture; Cardiac catheterization; and Port-a-cath removal (07/29/2012). FAMILY HISTORY Her family history includes Alcohol abuse in her brother, father, paternal grandfather, and paternal uncle; Cancer in her paternal grandmother; Heart attack in her father; Hypertension in her mother. SOCIAL HISTORY She  reports that she has never smoked. She has never used smokeless tobacco. She reports that she drinks alcohol. She reports that she does not use drugs.  MEDICARE WELLNESS OBJECTIVES: Physical activity:   Cardiac risk factors:   Depression/mood screen:   Depression screen [facility] 2/9 08/26/2014  Decreased Interest 0  Down, Depressed, Hopeless 0  PHQ - 2 Score 0  Some recent data might be hidden    ADLs:  No flowsheet data found.   Cognitive Testing  Alert? Yes  Normal Appearance?Yes  Oriented to person? Yes  Place? Yes   Time? Yes  Recall of three objects?  Yes  Can perform simple calculations? Yes  Displays appropriate judgment?Yes  Can read the correct time from a watch face?Yes  EOL planning: Does patient have an advance directive?: No Would patient like information on creating an advanced directive?: Yes - Educational materials given  Review of Systems  Constitutional: Positive for malaise/fatigue. Negative for chills, diaphoresis, fever and weight loss.  HENT: Negative.   Eyes: Negative.   Respiratory: Positive for cough, sputum production, shortness of breath and wheezing. Negative for hemoptysis.   Cardiovascular: Positive for orthopnea (worse on left side). Negative for chest pain, palpitations, claudication, leg swelling and PND.  Gastrointestinal: Negative.   Genitourinary: Negative.   Musculoskeletal: Positive for joint pain. Negative for back pain, falls, myalgias and neck pain.  Skin:  Negative.  Negative for rash.  Neurological: Negative.  Negative for dizziness, focal weakness and weakness.  Psychiatric/Behavioral: Positive for depression and substance abuse. Negative for hallucinations, memory loss and suicidal ideas. The patient is nervous/anxious and has insomnia.   All other systems reviewed and are negative.    Objective:     Blood pressure 116/80, pulse 61, temperature 97.7 F (36.5 C), resp. rate 16, height 5\' 5"  (1.651 m), weight 177 lb (80.3 kg), SpO2 95 %. Body mass index is 29.45 kg/m.  General appearance: alert, no distress, WD/WN, female HEENT: normocephalic, sclerae anicteric, TMs pearly, nares patent, no discharge or erythema, pharynx normal Oral cavity: MMM, no lesions Neck: supple, no lymphadenopathy, no thyromegaly, no masses Heart: RRR, normal S1, S2, no murmurs Lungs: CTA bilaterally, no wheezes, rhonchi, or rales Abdomen: +bs, soft, + epigastric tender, non distended, no masses, no hepatomegaly, no splenomegaly Musculoskeletal: nontender, no swelling, no obvious deformity. Right shoulder without deformity, + pain at  subacromial bursa/bicep tendon, + impingement signs, pain with abduction to 90 degree, full rom without pain to 180.  Neurovascularly intact distally. + tinels sign but negative phalens Extremities: no edema, no cyanosis, no clubbing Pulses: 2+ symmetric, upper and lower extremities, normal cap refill Neurological: alert, oriented x 3, CN2-12 intact, strength normal upper extremities and lower extremities, sensation normal throughout, DTRs 2+ throughout, no cerebellar signs, gait normal Psychiatric: normal affect, behavior normal, pleasant, denies SI/HI  Medicare Attestation I have personally reviewed: The patient's medical and social history Their use of alcohol, tobacco or illicit drugs Their current medications and supplements The patient's functional ability including ADLs,fall risks, home safety risks, cognitive, and hearing  and visual impairment Diet and physical activities Evidence for depression or mood disorders  The patient's weight, height, BMI, and visual acuity have been recorded in the chart.  I have made referrals, counseling, and provided education to the patient based on review of the above and I have provided the patient with a written personalized care plan for preventive services.     Vicie Mutters, PA-C   03/21/2016

## 2016-03-21 NOTE — Telephone Encounter (Signed)
Called by Mickel Baas that her pharmacy had to order Dr. Gustavus Bryant prescription for codeine tablets directly from the manufacturer. This would take about a week. Gave her pharmacy a prescription for Robitussin EC, Disp 8 ounces, 1-3 teaspoons PO BID until they can get the codeine tablets from the manufacturer.

## 2016-03-21 NOTE — Telephone Encounter (Signed)
Aware and chart updated

## 2016-03-21 NOTE — Telephone Encounter (Signed)
Will forward to MW to let him know ENT pt has been seen by is Dr Richardson Landry in [place]

## 2016-03-21 NOTE — Assessment & Plan Note (Signed)
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

## 2016-03-22 ENCOUNTER — Telehealth: Payer: Self-pay | Admitting: Internal Medicine

## 2016-03-22 NOTE — Progress Notes (Signed)
Spoke with pt and notified of results per Dr. Wert. Pt verbalized understanding and denied any questions. 

## 2016-03-22 NOTE — Telephone Encounter (Signed)
Result Notes  Notes Recorded by Rosana Berger, CMA on 03/22/2016 at 8:56 AM EDT Spoke with pt and notified of results per Dr. Melvyn Novas. Pt verbalized understanding and denied any questions. ------ Notes Recorded by Glean Hess, CMA on 03/21/2016 at 11:55 AM EDT Attempted to contact patient, left message for patient to return call.  ------ Notes Recorded by Tanda Rockers, MD on 03/21/2016 at 5:32 AM EDT Call pt: Reviewed cxr and no acute change so no change in recommendations made at ov    Notes Recorded by Rosana Berger, CMA on 03/22/2016 at 8:56 AM EDT Spoke with pt and notified of results per Dr. Melvyn Novas. Pt verbalized understanding and denied any questions. ------ Notes Recorded by Tanda Rockers, MD on 03/21/2016 at 2:46 PM EDT Call patient : Studies are c/w mild allergies dog > cat, nothing otherwise rx is avoidance only for now -------------------------------------------------------- Spoke with pt. She is aware of results. Nothing further was needed.

## 2016-03-22 NOTE — Telephone Encounter (Signed)
[phone], pt cb for results

## 2016-03-23 ENCOUNTER — Telehealth: Payer: Self-pay | Admitting: Internal Medicine

## 2016-03-23 ENCOUNTER — Other Ambulatory Visit: Payer: Self-pay | Admitting: *Deleted

## 2016-03-23 NOTE — Telephone Encounter (Signed)
Called patient, she said that she is getting the Codeine prescription through the warehouse of [facility] because they have to special order it.  Called [facility] to confirm order.   Nothing further needed.

## 2016-03-23 NOTE — Telephone Encounter (Signed)
Received a call from [facility].  They said that the only way they can change the prescription for Codeine is if Dr. Melvyn Novas himself calls them to confirm the medication.  They will not accept a nurse.  Dr. Melvyn Novas, please call [facility] at [phone]

## 2016-03-23 NOTE — Telephone Encounter (Signed)
[facility] is calling about the sig on the codeine rx that was turned in to be filled.  Please clarify how you would like the sig to read. thanks

## 2016-03-23 NOTE — Telephone Encounter (Signed)
done

## 2016-03-23 NOTE — Telephone Encounter (Signed)
1-2 q4h prn

## 2016-03-27 LAB — HYPERSENSITIVITY PNUEMONITIS PROFILE

## 2016-03-30 DIAGNOSIS — M4722 Other spondylosis with radiculopathy, cervical region: Secondary | ICD-10-CM | POA: Diagnosis not present

## 2016-03-30 DIAGNOSIS — M25512 Pain in left shoulder: Secondary | ICD-10-CM | POA: Diagnosis not present

## 2016-04-02 DIAGNOSIS — M79602 Pain in left arm: Secondary | ICD-10-CM | POA: Diagnosis not present

## 2016-04-09 ENCOUNTER — Telehealth: Payer: Self-pay | Admitting: Hematology and Oncology

## 2016-04-09 NOTE — Telephone Encounter (Signed)
04/17/2016 Appointment rescheduled to 04/11/2016 per patient request. Also 04/19/2016  Appointment rescheduled for 05/01/2016 per patient request as well. Patient has family coming in town and will not be available the week of 04/17/2016.

## 2016-04-11 ENCOUNTER — Ambulatory Visit (INDEPENDENT_AMBULATORY_CARE_PROVIDER_SITE_OTHER): Payer: Medicare Other | Admitting: Internal Medicine

## 2016-04-11 ENCOUNTER — Encounter: Payer: Self-pay | Admitting: Internal Medicine

## 2016-04-11 ENCOUNTER — Other Ambulatory Visit (HOSPITAL_BASED_OUTPATIENT_CLINIC_OR_DEPARTMENT_OTHER): Payer: Medicare Other

## 2016-04-11 VITALS — BP 114/70 | HR 74 | Ht 65.0 in | Wt 177.0 lb

## 2016-04-11 DIAGNOSIS — J45991 Cough variant asthma: Secondary | ICD-10-CM | POA: Diagnosis not present

## 2016-04-11 DIAGNOSIS — C50511 Malignant neoplasm of lower-outer quadrant of right female breast: Secondary | ICD-10-CM

## 2016-04-11 DIAGNOSIS — R0602 Shortness of breath: Secondary | ICD-10-CM | POA: Diagnosis not present

## 2016-04-11 LAB — CBC WITH DIFFERENTIAL/PLATELET
BASO%: 0.7 % (ref 0.0–2.0)
Basophils Absolute: 0 10*3/uL (ref 0.0–0.1)
EOS%: 2.2 % (ref 0.0–7.0)
Eosinophils Absolute: 0.1 10*3/uL (ref 0.0–0.5)
HCT: 42.6 % (ref 34.8–46.6)
HEMOGLOBIN: 14.4 g/dL (ref 11.6–15.9)
LYMPH#: 1.6 10*3/uL (ref 0.9–3.3)
LYMPH%: 26.5 % (ref 14.0–49.7)
MCH: 34 pg (ref 25.1–34.0)
MCHC: 33.7 g/dL (ref 31.5–36.0)
MCV: 101 fL (ref 79.5–101.0)
MONO#: 0.6 10*3/uL (ref 0.1–0.9)
MONO%: 10.3 % (ref 0.0–14.0)
NEUT%: 60.3 % (ref 38.4–76.8)
NEUTROS ABS: 3.5 10*3/uL (ref 1.5–6.5)
Platelets: 226 10*3/uL (ref 145–400)
RBC: 4.22 10*6/uL (ref 3.70–5.45)
RDW: 12.9 % (ref 11.2–14.5)
WBC: 5.9 10*3/uL (ref 3.9–10.3)

## 2016-04-11 LAB — COMPREHENSIVE METABOLIC PANEL
ALK PHOS: 77 U/L (ref 40–150)
ALT: 56 U/L — AB (ref 0–55)
AST: 59 U/L — AB (ref 5–34)
Albumin: 3.6 g/dL (ref 3.5–5.0)
Anion Gap: 10 mEq/L (ref 3–11)
BILIRUBIN TOTAL: 0.3 mg/dL (ref 0.20–1.20)
BUN: 5.4 mg/dL — AB (ref 7.0–26.0)
CO2: 28 mEq/L (ref 22–29)
CREATININE: 0.7 mg/dL (ref 0.6–1.1)
Calcium: 9.1 mg/dL (ref 8.4–10.4)
Chloride: 100 mEq/L (ref 98–109)
EGFR: >90 mL/min/{1.73_m2} (ref >90)
GLUCOSE: 117 mg/dL (ref 70–140)
POTASSIUM: 3.7 meq/L (ref 3.5–5.1)
SODIUM: 138 meq/L (ref 136–145)
TOTAL PROTEIN: 7.2 g/dL (ref 6.4–8.3)

## 2016-04-11 MED ORDER — PREDNISONE 10 MG PO TABS
ORAL_TABLET | ORAL | 0 refills | Status: DC
Start: 2016-04-11 — End: 2016-05-07

## 2016-04-11 MED ORDER — GABAPENTIN 300 MG PO CAPS: ORAL_CAPSULE | ORAL | 3 refills | Status: DC

## 2016-04-11 MED ORDER — GABAPENTIN 300 MG PO CAPS: 300.0000 mg | ORAL_CAPSULE | Freq: Three times a day (TID) | ORAL | 3 refills | Status: DC

## 2016-04-11 MED ORDER — CODEINE SULFATE 30 MG PO TABS: ORAL_TABLET | ORAL | 0 refills | Status: DC

## 2016-04-11 MED ORDER — BUDESONIDE-FORMOTEROL FUMARATE 80-4.5 MCG/ACT IN AERO: 2.0000 | INHALATION_SPRAY | Freq: Two times a day (BID) | RESPIRATORY_TRACT | 12 refills | Status: DC

## 2016-04-11 NOTE — Patient Instructions (Addendum)
When you can afford to be drowsy:  the key to effective treatment for your cough is eliminating the non-stop cycle of cough you're stuck in long enough to let your airway heal completely and then see if there is anything still making you cough once you stop the cough suppression, but this should take no more than 5 days to figure out  First take delsym two tsp every 12 hours and supplement if needed with  Codeine 30 mg  up to 2 every 4 hours to suppress the urge to cough at all or even clear your throat. Swallowing water or using ice chips/non mint and menthol containing candies (such as lifesavers or sugarless jolly ranchers) are also effective.  You should rest your voice and avoid activities that you know make you cough.  Once you have eliminated the cough for 3 straight days try reducing the codeine first,  then the delsym as tolerated.    Prednisone 10 mg take  4 each am x 2 days,   2 each am x 2 days,  1 each am x 2 days and stop (this is to eliminate allergies and inflammation from coughing)   For maintenance purpose Anytime you cough please do so into the flutter valve Gabapentin 300 mg bfast and supper and bedtime automatically Protonix (pantoprazole) Take 30-60 min before first meal of the day and Pepcid 20 mg one bedtime plus chlorpheniramine 4 mg x 2 at bedtime (both available over the counter)   Ok to take during the day as needed   GERD (REFLUX)  is an extremely common cause of respiratory symptoms, many times with no significant heartburn at all.    It can be treated with medication, but also with lifestyle changes including avoidance of late meals, excessive alcohol, smoking cessation, and avoid fatty foods, chocolate, peppermint, colas, red wine, and acidic juices such as orange juice.  NO MINT OR MENTHOL PRODUCTS SO NO COUGH DROPS   USE HARD CANDY INSTEAD (jolley ranchers or Stover's or Lifesavers (all available in sugarless versions) NO OIL BASED VITAMINS - use powdered  substitutes.  Re try symbicort 80 Take 2 puffs first thing in am and then another 2 puffs about 12 hours later.   You will need a high resolution Chest Ct instead of the usual views when you have it done in September  Please schedule a follow up office visit in 2 weeks, sooner if needed  - consider trial of singulair next

## 2016-04-11 NOTE — Progress Notes (Signed)
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 Advice Only    Was seeing Dr. Patsey Berthold for SOB, has increased in the past several months.    noted decreased activity tol with 30 lb wt gain and now sometimes breathing uncomfortable sitting still, stops just walking across her yard, back from mailbox stopping no real change p saba and worse x sev months, coughs in am so hard vomits, produces white mucus but doesn't wake up but starts when gets to BR p stirring. rec Bisoprolol 5mg  each am in place of coreg to see what effect this has on your exercise tolerance  Pepcid ac 20 mg at bedtime plus chlortrimeton 4mg  at bedtime Incentive spirometry and 30 min of aerobic exercise daily - short of breath not out of breath   03/02/2014 f/u ov/Hayk Divis re:  ? Asthma  Chief Complaint  Patient presents with  . Followup with cxr and pft's    Pt states that her breathing has been worse for the past 3-4 days. She states that she is SOB with any exertion.   rec symbicort 80 up to 2 puffs every 12 hour to see if can turn a bad day into a good one Stay on  pepcid at bedtime  and take 2  chlortrimeton at bedtime also to see if helps am congestion  For drainage take chlortrimeton (chlorpheniramine) 4 mg every 4 hours available over the counter (may cause drowsiness)  Start Pantoprazole (protonix) 40 mg   Take 30-60 min before first meal of the day  GERD   Diet    02/03/2015 NP acute office visit  /  Patient presents for an acute office visit She complains of a productive  cough with thick, creamy congestion, sinus drainage, wheezing for 1 week. She took some leftover prednisone over the last 4 days. About 10-20 mg daily. She also started on Bactrim over the last 4 days-this was a leftover anabiotic. rec Levaquin 500 mg daily for 7 days, take with food Prednisone taper over week.  Mucinex DM twice daily as needed for cough and congestion.   02/10/2015 f/u ov/Ashby Moskal re: on symbicort 80 2bid but not maintaining it between flares  and no rescue/ not on gerd rx Chief Complaint  Patient presents with  . Acute Visit    Pt states cough not improving very much since last visit 02/03/15- prod with large amounts of white sputum. Her wheezing is also not resolved, but it is some better. She has occ SOB. She is using albuterol at least 3 x per day.   for months stuffy nose and excess mucus then severe coughing fits developed  rec Please see patient coordinator before you leave today  to schedule CT of sinuses and if neg try For drainage take chlortrimeton (chlorpheniramine) 4 mg every 4 hours available over the counter (may cause drowsiness)  Avoid bird exposure  Prednisone 10 mg take  4 each am x 2 days,   2 each am  x 2 days,  1 each am x 2 days and stop  Stay on symbicort 80 Take 2 puffs first thing in am and then another 2 puffs about 12 hours later.  Only use your albuterol as a rescue medication  Take delsym two tsp every 12 hours and supplement if needed with  tramadol 50 mg up to 2 every 4 hours  Once you have eliminated the cough for 3 straight days try reducing the tramadol first,  then the delsym as tolerated.   Try pantoprazole 40 mg  Take 30-60 min before first meal of the day and Pepcid (famotidine) 20 mg one bedtime until cough is completely gone for at least a week without the need for cough suppression  10/24/15 ENT [place]:  Dx allergic rhinitis/ gerd rec dexilant/ allegra d/allergy eval > not done    03/20/2016  Acute exteneded  ov/Zeferino Mounts re:  refractory cough / R D hemiparesis  Chief Complaint  Patient presents with  . Acute Visit    Pt states had coughed up some bright red sputum yesterday. She is not coughing anymore that usual "same old cough".  She also c/o "extreme SOB"- steadily increasing since her last visit here over a year ago. She states she is SOB while lying down but only if she lies on her right side.   Since last ov gradually worse can only lie down with R side down otherwise immediate smothering on back or L side down  Cough daily p stirs around and is quite harsh  But not productive   When uses albuterol helps cough some p 10 min and lasts sev hours and uses up to 3 x daily  While on symbicort much less need for albuterol but still daily cough/ last used it sev months prior to OV   Coughs so hard she vomits and this takes her breath away otherwise no resting sob/ wheeze can be heard across the room by others. Had ent eval [place] rec allergy eval. rec Change gabapentin 300 mg twice daily, not as needed   Prednisone 10 mg take  4 each am x 2 days,   2 each am x 2 days,  1 each am x 2 days and stop  Only use your albuterol as a rescue medication Take delsym two tsp every 12 hours and supplement if needed with  Codeine 30  mg up to 2 every 4 hours   Start  pantoprazole 40 mg  Take 30-60 min before first meal of the day and Pepcid (famotidine) 20 mg one bedtime until cough is completely gone for at least a week without the need for cough suppression Please schedule a follow up office visit in 2 weeks, sooner if needed with all meds in hand     04/11/2016  f/u ov/Coreen Shippee re:  Refractory cough x one year / did not bring codeine  Chief Complaint  Patient presents with  . Follow-up    Pt c/o increased SOB and wheezing since this am. She has good and bad days and she states today is a bad day. She is coughing less.   still taking codeine avg one daily and not using flutter valve at all,  occ wakes  up to a tbsp or two of  white mucus when supine In general sleeps ok though the night  Not using gabapentin as rec "it makes me too sleepy if take with codeine"  Has not used saba at all today though wheeze worse than usual  No resp to pred in past. Has not tried singulair    No obvious patterns in day to day or daytime variabilty or assoc excess or purulent sputum  or cp or chest tightness, subjective wheeze or overt   hb symptoms. No unusual exp hx or h/o childhood pna/ asthma or knowledge of premature birth.  Sleeping ok without nocturnal  or early am exacerbation  of respiratory  c/o's or need for noct saba. Also denies any obvious fluctuation of symptoms with weather or environmental changes or other aggravating or alleviating factors except as outlined above   Current Medications, Allergies, Complete Past Medical History, Past Surgical History, Family History, and Social History were reviewed in [employer] record.  ROS  The following are not active complaints unless bolded sore throat, dysphagia, dental problems, itching, sneezing,  nasal congestion or excess/ purulent secretions, ear ache,   fever, chills, sweats, unintended wt loss, pleuritic or exertional cp, hemoptysis,  orthopnea pnd or leg swelling, presyncope, palpitations, abdominal pain, anorexia, nausea, vomiting, diarrhea  or change in bowel or urinary habits, change in stools or urine, dysuria,hematuria,  rash, arthralgias, visual complaints, headache, numbness weakness or ataxia or problems with walking or coordination,  change in mood/affect or memory.          Objective:   Physical Exam   amb wf    03/02/2014        169   > 173. 02/03/2015 > 02/10/2015 176 > 03/20/2016  176 > 04/11/2016   177   Vital signs reviewed   HEENT: nl dentition, turbinates, and orophanx. Nl external ear canals without cough reflex   NECK :  without JVD/Nodes/TM/ nl carotid upstrokes bilaterally   LUNGS: no acc muscle use,   Lots of  transmitted upper airway wheezing   CV:  RRR  no s3 or murmur or increase in P2, no edema   ABD:  soft and nontender with nl excursion in the supine position. No bruits or organomegaly, bowel sounds nl  MS:  warm without deformities, calf tenderness, cyanosis or clubbing  SKIN: warm and dry without lesions    NEURO:  alert, approp, no deficits       CXR PA and Lateral:   03/20/2016 :    I personally reviewed images and agree with radiology impression as follows:   Chronic changes without acute abnormality  cxr review: Between 12/2005 and 06/2006 progressive R lung atx and elevated hd and 07/02/2006 sluggish R hd on fluoro but no paradox             Assessment & Plan:

## 2016-04-12 ENCOUNTER — Other Ambulatory Visit: Payer: Self-pay | Admitting: Radiology

## 2016-04-12 DIAGNOSIS — N6459 Other signs and symptoms in breast: Secondary | ICD-10-CM | POA: Diagnosis not present

## 2016-04-12 DIAGNOSIS — R928 Other abnormal and inconclusive findings on diagnostic imaging of breast: Secondary | ICD-10-CM | POA: Diagnosis not present

## 2016-04-12 DIAGNOSIS — N63 Unspecified lump in breast: Secondary | ICD-10-CM | POA: Diagnosis not present

## 2016-04-12 DIAGNOSIS — Z853 Personal history of malignant neoplasm of breast: Secondary | ICD-10-CM | POA: Diagnosis not present

## 2016-04-13 DIAGNOSIS — S143XXA Injury of brachial plexus, initial encounter: Secondary | ICD-10-CM | POA: Diagnosis not present

## 2016-04-13 DIAGNOSIS — M542 Cervicalgia: Secondary | ICD-10-CM | POA: Diagnosis not present

## 2016-04-13 LAB — NITRIC OXIDE: Nitric Oxide: 20

## 2016-04-13 NOTE — Assessment & Plan Note (Addendum)
-   03/02/2014  pfts  FEV1  1.45(56%) ratio 89 /VC  1.48 s obst, dlco 78 corrects to 154 and ERV 16%   - sinus CT 04/27/15 > No evidence of sinusitis. - FENO 03/20/2016  =  10  - Allergy profile 03/20/2016 >  Eos 0.1 /  IgE  99  RAST pos  dog > cat - 03/20/2016  After extensive coaching HFA effectiveness =    90% but immediate severe coughing fits produced - 03/20/2016  changed neurontin to 300 mg bid from hs prn> did not do 04/11/2016  - 03/20/16 HSP profile neg  - Spirometry 04/11/2016  FEV1 0.91 (34%)  Ratio 71  VC 1.29  With active wheeze/ mild curvature - FENO 04/11/2016  =   20   DDX of  difficult airways management almost all start with A and  include Adherence, Ace Inhibitors, Acid Reflux, Active Sinus Disease, Alpha 1 Antitripsin deficiency, Anxiety masquerading as Airways dz,  ABPA,  Allergy(esp in young), Aspiration (esp in elderly), Adverse effects of meds,  Active smokers, A bunch of PE's (a small clot burden can't cause this syndrome unless there is already severe underlying pulm or vascular dz with poor reserve) plus two Bs  = Bronchiectasis and Beta blocker use..and one C= CHF  Adherence is always the initial "prime suspect" and is a multilayered concern that requires a "trust but verify" approach in every patient - starting with knowing how to use medications, especially inhalers, correctly, keeping up with refills and understanding the fundamental difference between maintenance and prns vs those medications only taken for a very short course and then stopped and not refilled.  - did not follow instructions re codeine, flutter or use of gabapentin - - The proper method of use, as well as anticipated side effects, of a metered-dose inhaler are discussed and demonstrated to the patient. Improved effectiveness after extensive coaching during this visit to a level of approximately 90 % from a baseline of 75 % > try hfa symbicort    ? Acid (or non-acid) GERD > always difficult to exclude as up to 75% of  pts in some series report no assoc GI/ Heartburn symptoms> rec continue max (24h)  acid suppression and diet restrictions/ reviewed   ? Allergy/asthma>  less likely with feno <25 but still possible/ try rechallenge with symb 80 2bid and consider adding symbicort next   ? Bronchiectasis > HRCT next      I had an extended discussion with the patient reviewing all relevant studies completed to date and  lasting 15 to 20 minutes of a 25 minute visit    Each maintenance medication was reviewed in detail including most importantly the difference between maintenance and prns and under what circumstances the prns are to be triggered using an action plan format that is not reflected in the computer generated alphabetically organized AVS.    Please see instructions for details which were reviewed in writing and the patient given a copy highlighting the part that I personally wrote and discussed at today's ov.

## 2016-04-13 NOTE — Assessment & Plan Note (Signed)
-   poor R HD fxn documented 06/2007 attributed to TRAM surgery previously by Dr Patsey Berthold - 03/02/2014 VC  1.48 s obst, dlco 78 corrects to 154 and ERV 16%   hrct ordered but still working dx is VCD > asthma

## 2016-04-17 ENCOUNTER — Other Ambulatory Visit: Payer: Self-pay

## 2016-04-19 ENCOUNTER — Ambulatory Visit: Payer: Self-pay | Admitting: Hematology and Oncology

## 2016-04-23 ENCOUNTER — Encounter (HOSPITAL_COMMUNITY): Payer: Self-pay

## 2016-04-23 ENCOUNTER — Ambulatory Visit (HOSPITAL_COMMUNITY)
Admission: RE | Admit: 2016-04-23 | Discharge: 2016-04-23 | Disposition: A | Discharge status: Home / Self Care | Payer: Medicare Other | Source: Ambulatory Visit | Attending: Hematology and Oncology | Admitting: Hematology and Oncology

## 2016-04-23 ENCOUNTER — Ambulatory Visit: Payer: Self-pay | Admitting: Hematology and Oncology

## 2016-04-23 DIAGNOSIS — R918 Other nonspecific abnormal finding of lung field: Secondary | ICD-10-CM | POA: Diagnosis not present

## 2016-04-23 DIAGNOSIS — K769 Liver disease, unspecified: Secondary | ICD-10-CM | POA: Insufficient documentation

## 2016-04-23 DIAGNOSIS — J90 Pleural effusion, not elsewhere classified: Secondary | ICD-10-CM | POA: Insufficient documentation

## 2016-04-23 DIAGNOSIS — R590 Localized enlarged lymph nodes: Secondary | ICD-10-CM | POA: Insufficient documentation

## 2016-04-23 DIAGNOSIS — Z9011 Acquired absence of right breast and nipple: Secondary | ICD-10-CM | POA: Insufficient documentation

## 2016-04-23 DIAGNOSIS — J45991 Cough variant asthma: Secondary | ICD-10-CM

## 2016-04-24 ENCOUNTER — Other Ambulatory Visit: Payer: Self-pay

## 2016-04-24 ENCOUNTER — Other Ambulatory Visit: Payer: Self-pay | Admitting: Hematology and Oncology

## 2016-04-24 ENCOUNTER — Telehealth: Payer: Self-pay

## 2016-04-24 DIAGNOSIS — C50511 Malignant neoplasm of lower-outer quadrant of right female breast: Secondary | ICD-10-CM

## 2016-04-24 NOTE — Telephone Encounter (Signed)
Received triage call from pt requesting Dr. Geralyn [name] recommendation given her latest CT done yesterday.  Per pt, her pulmonologist recommended pt have PET based on results of CT.  Pt inquiring if Dr. Lindi [name] agrees and if so, if this needs to be done before next appt scheduled for 9/19.  CT results reviewed by Dr. Lindi [name] who does recommend pt have PET scan prior to next appt.  Order entered to have this done within next 1-2 weeks.  Informed pt that she should expect a call from central scheduling to schedule this scan once authorization was obtained.  All questions and concerns addressed and pt without further questions at time of call.

## 2016-04-26 ENCOUNTER — Encounter (HOSPITAL_COMMUNITY)
Admission: RE | Admit: 2016-04-26 | Discharge: 2016-04-26 | Disposition: A | Discharge status: Home / Self Care | Payer: Medicare Other | Source: Ambulatory Visit | Attending: Hematology and Oncology | Admitting: Hematology and Oncology

## 2016-04-26 DIAGNOSIS — M5412 Radiculopathy, cervical region: Secondary | ICD-10-CM | POA: Diagnosis not present

## 2016-04-26 DIAGNOSIS — C50511 Malignant neoplasm of lower-outer quadrant of right female breast: Secondary | ICD-10-CM | POA: Insufficient documentation

## 2016-04-26 DIAGNOSIS — C50911 Malignant neoplasm of unspecified site of right female breast: Secondary | ICD-10-CM | POA: Diagnosis not present

## 2016-04-26 LAB — GLUCOSE, CAPILLARY: Glucose-Capillary: 125 mg/dL — ABNORMAL HIGH (ref 65–99)

## 2016-04-26 MED ORDER — FLUDEOXYGLUCOSE F - 18 (FDG) INJECTION
11.1000 | Freq: Once | INTRAVENOUS | Status: AC | PRN
  Administered 2016-04-26: 11.1 via INTRAVENOUS

## 2016-05-01 ENCOUNTER — Ambulatory Visit (HOSPITAL_BASED_OUTPATIENT_CLINIC_OR_DEPARTMENT_OTHER): Payer: Medicare Other | Admitting: Hematology and Oncology

## 2016-05-01 ENCOUNTER — Encounter: Payer: Self-pay | Admitting: Hematology and Oncology

## 2016-05-01 ENCOUNTER — Ambulatory Visit: Payer: Self-pay | Admitting: Internal Medicine

## 2016-05-01 DIAGNOSIS — C50511 Malignant neoplasm of lower-outer quadrant of right female breast: Secondary | ICD-10-CM | POA: Diagnosis not present

## 2016-05-01 DIAGNOSIS — R918 Other nonspecific abnormal finding of lung field: Secondary | ICD-10-CM

## 2016-05-01 DIAGNOSIS — C787 Secondary malignant neoplasm of liver and intrahepatic bile duct: Secondary | ICD-10-CM | POA: Diagnosis not present

## 2016-05-01 DIAGNOSIS — C7989 Secondary malignant neoplasm of other specified sites: Secondary | ICD-10-CM | POA: Diagnosis not present

## 2016-05-01 DIAGNOSIS — C7951 Secondary malignant neoplasm of bone: Secondary | ICD-10-CM | POA: Diagnosis not present

## 2016-05-01 DIAGNOSIS — Z170 Estrogen receptor positive status [ER+]: Secondary | ICD-10-CM
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
## 2016-05-02 ENCOUNTER — Other Ambulatory Visit: Payer: Self-pay

## 2016-05-02 DIAGNOSIS — C50511 Malignant neoplasm of lower-outer quadrant of right female breast: Secondary | ICD-10-CM

## 2016-05-02 DIAGNOSIS — K769 Liver disease, unspecified: Secondary | ICD-10-CM

## 2016-05-02 NOTE — Progress Notes (Signed)
Received request from Dr. Lindi Adie to acquire first month supply of Ibrance 125mg  so pt could start 9/19.  Spoke with Brandy in pharmacy who assisted me in obtaining medication for pt.  Ibrance 125mg  tablets given to pt.  Lot [id number] Exp 01/2017 Qty 21.  Pt educated on medication by Dr. Lindi Adie and admin instructions reviewed with pt.  Order also entered for US-guided biopsy of liver lesion.  Pt aware she will be hearing from scheduler with appt date and time.  Scheduling message also sent to start pt in 1 week on faslodex injections.  All information reviewed with pt prior to discharge and all questions answered.

## 2016-05-04 ENCOUNTER — Other Ambulatory Visit: Payer: Self-pay

## 2016-05-04 DIAGNOSIS — C50511 Malignant neoplasm of lower-outer quadrant of right female breast: Secondary | ICD-10-CM

## 2016-05-07 ENCOUNTER — Ambulatory Visit (HOSPITAL_BASED_OUTPATIENT_CLINIC_OR_DEPARTMENT_OTHER): Payer: Medicare Other | Admitting: Hematology and Oncology

## 2016-05-07 ENCOUNTER — Encounter: Payer: Self-pay | Admitting: Hematology and Oncology

## 2016-05-07 ENCOUNTER — Ambulatory Visit (HOSPITAL_BASED_OUTPATIENT_CLINIC_OR_DEPARTMENT_OTHER): Payer: Medicare Other

## 2016-05-07 ENCOUNTER — Other Ambulatory Visit (HOSPITAL_BASED_OUTPATIENT_CLINIC_OR_DEPARTMENT_OTHER): Payer: Medicare Other

## 2016-05-07 VITALS — BP 115/82 | HR 71 | Temp 98.4°F | Resp 18

## 2016-05-07 VITALS — BP 122/81 | HR 70 | Temp 98.7°F | Resp 19 | Wt 172.3 lb

## 2016-05-07 DIAGNOSIS — C7951 Secondary malignant neoplasm of bone: Secondary | ICD-10-CM

## 2016-05-07 DIAGNOSIS — C50511 Malignant neoplasm of lower-outer quadrant of right female breast: Secondary | ICD-10-CM

## 2016-05-07 DIAGNOSIS — Z5111 Encounter for antineoplastic chemotherapy: Secondary | ICD-10-CM | POA: Diagnosis not present

## 2016-05-07 DIAGNOSIS — C7800 Secondary malignant neoplasm of unspecified lung: Secondary | ICD-10-CM | POA: Diagnosis not present

## 2016-05-07 DIAGNOSIS — C787 Secondary malignant neoplasm of liver and intrahepatic bile duct: Secondary | ICD-10-CM

## 2016-05-07 DIAGNOSIS — C773 Secondary and unspecified malignant neoplasm of axilla and upper limb lymph nodes: Secondary | ICD-10-CM | POA: Diagnosis not present

## 2016-05-07 DIAGNOSIS — Z170 Estrogen receptor positive status [ER+]: Secondary | ICD-10-CM

## 2016-05-07 LAB — CBC WITH DIFFERENTIAL/PLATELET
BASO%: 0.6 % (ref 0.0–2.0)
BASOS ABS: 0 10*3/uL (ref 0.0–0.1)
EOS%: 2.4 % (ref 0.0–7.0)
Eosinophils Absolute: 0.1 10*3/uL (ref 0.0–0.5)
HEMATOCRIT: 45.9 % (ref 34.8–46.6)
HEMOGLOBIN: 15.4 g/dL (ref 11.6–15.9)
LYMPH#: 1.1 10*3/uL (ref 0.9–3.3)
LYMPH%: 23.6 % (ref 14.0–49.7)
MCH: 34 pg (ref 25.1–34.0)
MCHC: 33.5 g/dL (ref 31.5–36.0)
MCV: 101.6 fL — ABNORMAL HIGH (ref 79.5–101.0)
MONO#: 0.2 10*3/uL (ref 0.1–0.9)
MONO%: 4.3 % (ref 0.0–14.0)
NEUT%: 69.1 % (ref 38.4–76.8)
NEUTROS ABS: 3.3 10*3/uL (ref 1.5–6.5)
Platelets: 228 10*3/uL (ref 145–400)
RBC: 4.52 10*6/uL (ref 3.70–5.45)
RDW: 12.4 % (ref 11.2–14.5)
WBC: 4.7 10*3/uL (ref 3.9–10.3)

## 2016-05-07 LAB — COMPREHENSIVE METABOLIC PANEL
ALBUMIN: 3.5 g/dL (ref 3.5–5.0)
ALK PHOS: 71 U/L (ref 40–150)
ALT: 80 U/L — AB (ref 0–55)
AST: 77 U/L — AB (ref 5–34)
Anion Gap: 9 mEq/L (ref 3–11)
BUN: 14 mg/dL (ref 7.0–26.0)
CALCIUM: 9.6 mg/dL (ref 8.4–10.4)
CO2: 28 mEq/L (ref 22–29)
CREATININE: 0.8 mg/dL (ref 0.6–1.1)
Chloride: 100 mEq/L (ref 98–109)
EGFR: 82 mL/min/{1.73_m2} — ABNORMAL LOW (ref >90)
GLUCOSE: 103 mg/dL (ref 70–140)
POTASSIUM: 4.1 meq/L (ref 3.5–5.1)
SODIUM: 137 meq/L (ref 136–145)
Total Bilirubin: 1.47 mg/dL — ABNORMAL HIGH (ref 0.20–1.20)
Total Protein: 7.2 g/dL (ref 6.4–8.3)

## 2016-05-07 MED ORDER — FULVESTRANT 250 MG/5ML IM SOLN
500.0000 mg | INTRAMUSCULAR | Status: DC
  Administered 2016-05-07: 250 mg via INTRAMUSCULAR
  Filled 2016-05-07: qty 10

## 2016-05-07 MED ORDER — OXYCODONE HCL 5 MG PO TABS: 5.0000 mg | ORAL_TABLET | Freq: Four times a day (QID) | ORAL | 0 refills | Status: DC | PRN

## 2016-05-07 MED ORDER — DENOSUMAB 120 MG/1.7ML ~~LOC~~ SOLN
120.0000 mg | Freq: Once | SUBCUTANEOUS | Status: AC
  Administered 2016-05-07: 120 mg via SUBCUTANEOUS
  Filled 2016-05-07: qty 1.7
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
## 2016-05-08 ENCOUNTER — Other Ambulatory Visit: Payer: Self-pay | Admitting: Radiology

## 2016-05-09 ENCOUNTER — Other Ambulatory Visit: Payer: Self-pay | Admitting: Radiology

## 2016-05-10 ENCOUNTER — Other Ambulatory Visit: Payer: Self-pay | Admitting: Internal Medicine

## 2016-05-10 ENCOUNTER — Other Ambulatory Visit: Payer: Self-pay

## 2016-05-10 ENCOUNTER — Other Ambulatory Visit: Payer: Self-pay | Admitting: General Surgery

## 2016-05-10 MED ORDER — PALBOCICLIB 125 MG PO CAPS: 125.0000 mg | ORAL_CAPSULE | Freq: Every day | ORAL | 0 refills | Status: DC

## 2016-05-10 NOTE — Progress Notes (Signed)
Order entered for Ibrance 125mg  daily 21 on 7 off.  Pt given first month sample bottle and started on 9/20.  Prescription entered for [facility] pharmacy so financial/pharmacy components can be worked on so pt can continue medication once first month supply runs out.  First attempt to enter order sent prescription to [facility] in error.  Called and told [facility] pharmacy to disregard this prescription.  Second prescription printed and signed by Dr. Lindi Adie and given to [facility] pharmacy for processing and plan of care moving forward.

## 2016-05-11 ENCOUNTER — Encounter (HOSPITAL_COMMUNITY): Payer: Self-pay

## 2016-05-11 ENCOUNTER — Encounter: Payer: Self-pay | Admitting: Pharmacist

## 2016-05-11 ENCOUNTER — Ambulatory Visit (HOSPITAL_COMMUNITY)
Admission: RE | Admit: 2016-05-11 | Discharge: 2016-05-11 | Disposition: A | Discharge status: Home / Self Care | Payer: Medicare Other | Source: Ambulatory Visit | Attending: Hematology and Oncology | Admitting: Hematology and Oncology

## 2016-05-11 DIAGNOSIS — E785 Hyperlipidemia, unspecified: Secondary | ICD-10-CM | POA: Diagnosis not present

## 2016-05-11 DIAGNOSIS — Z853 Personal history of malignant neoplasm of breast: Secondary | ICD-10-CM | POA: Insufficient documentation

## 2016-05-11 DIAGNOSIS — C787 Secondary malignant neoplasm of liver and intrahepatic bile duct: Secondary | ICD-10-CM | POA: Insufficient documentation

## 2016-05-11 DIAGNOSIS — Z888 Allergy status to other drugs, medicaments and biological substances status: Secondary | ICD-10-CM | POA: Insufficient documentation

## 2016-05-11 DIAGNOSIS — F1020 Alcohol dependence, uncomplicated: Secondary | ICD-10-CM | POA: Diagnosis not present

## 2016-05-11 DIAGNOSIS — Z955 Presence of coronary angioplasty implant and graft: Secondary | ICD-10-CM | POA: Insufficient documentation

## 2016-05-11 DIAGNOSIS — C50511 Malignant neoplasm of lower-outer quadrant of right female breast: Secondary | ICD-10-CM | POA: Insufficient documentation

## 2016-05-11 DIAGNOSIS — Z9011 Acquired absence of right breast and nipple: Secondary | ICD-10-CM | POA: Diagnosis not present

## 2016-05-11 DIAGNOSIS — K769 Liver disease, unspecified: Secondary | ICD-10-CM | POA: Insufficient documentation

## 2016-05-11 DIAGNOSIS — Z811 Family history of alcohol abuse and dependence: Secondary | ICD-10-CM | POA: Insufficient documentation

## 2016-05-11 DIAGNOSIS — K7689 Other specified diseases of liver: Secondary | ICD-10-CM | POA: Diagnosis not present

## 2016-05-11 DIAGNOSIS — Z803 Family history of malignant neoplasm of breast: Secondary | ICD-10-CM | POA: Insufficient documentation

## 2016-05-11 DIAGNOSIS — Z8249 Family history of ischemic heart disease and other diseases of the circulatory system: Secondary | ICD-10-CM | POA: Diagnosis not present

## 2016-05-11 DIAGNOSIS — Z9221 Personal history of antineoplastic chemotherapy: Secondary | ICD-10-CM | POA: Insufficient documentation

## 2016-05-11 DIAGNOSIS — I10 Essential (primary) hypertension: Secondary | ICD-10-CM | POA: Insufficient documentation

## 2016-05-11 LAB — COMPREHENSIVE METABOLIC PANEL
ALT: 98 U/L — ABNORMAL HIGH (ref 14–54)
AST: 81 U/L — AB (ref 15–41)
Albumin: 4.1 g/dL (ref 3.5–5.0)
Alkaline Phosphatase: 61 U/L (ref 38–126)
Anion gap: 9 (ref 5–15)
BUN: 7 mg/dL (ref 6–20)
CHLORIDE: 102 mmol/L (ref 101–111)
CO2: 28 mmol/L (ref 22–32)
Calcium: 9.8 mg/dL (ref 8.9–10.3)
Creatinine, Ser: 0.64 mg/dL (ref 0.44–1.00)
GFR calc Af Amer: >60 mL/min (ref >60)
Glucose, Bld: 142 mg/dL — ABNORMAL HIGH (ref 65–99)
POTASSIUM: 3.9 mmol/L (ref 3.5–5.1)
SODIUM: 139 mmol/L (ref 135–145)
Total Bilirubin: 1.2 mg/dL (ref 0.3–1.2)
Total Protein: 7.4 g/dL (ref 6.5–8.1)

## 2016-05-11 LAB — CBC WITH DIFFERENTIAL/PLATELET
BASOS ABS: 0 10*3/uL (ref 0.0–0.1)
Basophils Relative: 0 %
Eosinophils Absolute: 0 10*3/uL (ref 0.0–0.7)
Eosinophils Relative: 1 %
HCT: 40.7 % (ref 36.0–46.0)
HEMOGLOBIN: 14 g/dL (ref 12.0–15.0)
LYMPHS ABS: 1 10*3/uL (ref 0.7–4.0)
LYMPHS PCT: 26 %
MCH: 34.1 pg — ABNORMAL HIGH (ref 26.0–34.0)
MCHC: 34.4 g/dL (ref 30.0–36.0)
MCV: 99.3 fL (ref 78.0–100.0)
Monocytes Absolute: 0.1 10*3/uL (ref 0.1–1.0)
Monocytes Relative: 3 %
NEUTROS ABS: 2.8 10*3/uL (ref 1.7–7.7)
NEUTROS PCT: 70 %
PLATELETS: 187 10*3/uL (ref 150–400)
RBC: 4.1 MIL/uL (ref 3.87–5.11)
RDW: 12.4 % (ref 11.5–15.5)
WBC: 3.9 10*3/uL — AB (ref 4.0–10.5)

## 2016-05-11 LAB — PROTIME-INR
INR: 1.05
PROTHROMBIN TIME: 13.8 s (ref 11.4–15.2)

## 2016-05-11 MED ORDER — FENTANYL CITRATE (PF) 100 MCG/2ML IJ SOLN
INTRAMUSCULAR | Status: AC
  Filled 2016-05-11: qty 4

## 2016-05-11 MED ORDER — FENTANYL CITRATE (PF) 100 MCG/2ML IJ SOLN
INTRAMUSCULAR | Status: AC | PRN
  Administered 2016-05-11: 50 ug via INTRAVENOUS
  Administered 2016-05-11: 25 ug via INTRAVENOUS
  Administered 2016-05-11: 50 ug via INTRAVENOUS

## 2016-05-11 MED ORDER — SODIUM CHLORIDE 0.9 % IV SOLN
INTRAVENOUS | Status: DC
  Administered 2016-05-11: 12:00:00 via INTRAVENOUS

## 2016-05-11 NOTE — Consult Note (Signed)
Chief Complaint: Patient was seen in consultation today for image guided  liver lesion biopsy  Referring Physician(s): [place]  Supervising Physician: Sandi Mariscal  Patient Status: Outpatient  History of Present Illness: Suzanne [name] is a 60 y.o. female with history of metastatic right breast carcinoma, initially diagnosed in 1994, status post right mastectomy with TRAM flap as well as chemotherapy. She has since had recurrences with additional chemotherapy as well as radiation.  Recent PET scan on 04/26/16 has revealed hypermetabolic soft tissue in the left infrahilar region as well as within left supraclavicular, mediastinal, bilateral hilar and upper abdominal lymph nodes along with liver and bony lesions. She presents today for image guided liver lesion biopsy for further evaluation/ER/PR/HER-2 neu testing.  Past Medical History:  Diagnosis Date  . Alcoholism ([place])   . Cancer [facility])    breast ca - 1994; recurred in 2007. s/p masectomy with flap rconstruction aand chemo '94. local recurrence on chest wall. chemo and XRT '07. now femara  . Depression   . Dyspnea    myoview 2011: EF 55% question of  mild reverible anterior defect. thought to be breast attenuation. echo 45-50% with global HK. Grade 1 diastolyic dysfunction. RV nml. cardiac MRI with EF 44% with septal HK. no scar   . History of coronary artery stent placement   . Hyperlipidemia   . Hypertension   . Insomnia   . Lung disorder     Past Surgical History:  Procedure Laterality Date  . CARDIAC CATHETERIZATION     [facility]  . MASTECTOMY    . ORIF DISTAL RADIUS FRACTURE    . PORT-A-CATH REMOVAL  07/29/2012   Procedure: REMOVAL PORT-A-CATH;  Surgeon: Haywood Lasso, MD;  Location: [place];  Service: General;  Laterality: Left;  . R masectomy  '94   . R transflap  '94    Allergies: Ace inhibitors  Medications: Prior to Admission medications   Medication Sig Start Date End Date  Taking? Authorizing Provider  albuterol (PROAIR HFA) 108 (90 Base) MCG/ACT inhaler USE 2 PUFFS BY MOUTH EVERY 6 HOURS AS NEEDED FOR WHEEZING OR SHORTNESSOF BREATH 12/16/15   Nicholas Lose, MD  bisoprolol (ZEBETA) 5 MG tablet TAKE 1 TABLET BY MOUTH DAILY. 12/08/15   Wellington Hampshire, MD  dextromethorphan (DELSYM) 30 MG/5ML liquid Take by mouth as needed for cough.    Historical Provider, MD  famotidine (PEPCID) 20 MG tablet Take 20 mg by mouth at bedtime.    Historical Provider, MD  gabapentin (NEURONTIN) 300 MG capsule Take 1 capsule (300 mg total) by mouth 3 (three) times daily. One twice daily 04/11/16   Tanda Rockers, MD  ibuprofen (ADVIL,MOTRIN) 200 MG tablet Take 200 mg by mouth every 6 (six) hours as needed.    Historical Provider, MD  letrozole (FEMARA) 2.5 MG tablet TAKE 1 TABLET BY MOUTH DAILY. 09/19/15   Nicholas Lose, MD  LORazepam (ATIVAN) 2 MG tablet TAKE ONE-HALF TO ONE TABLET BY MOUTH AS NEEDED AT BEDTIME 12/08/15 06/08/16  Unk Pinto, MD  oxyCODONE (OXY IR/ROXICODONE) 5 MG immediate release tablet Take 1 tablet (5 mg total) by mouth every 6 (six) hours as needed for severe pain. 05/07/16   Nicholas Lose, MD  palbociclib (IBRANCE) 125 MG capsule Take 1 capsule (125 mg total) by mouth daily with breakfast. Take whole with food. 05/10/16   Nicholas Lose, MD  pantoprazole (PROTONIX) 40 MG tablet Take 1 tablet (40 mg total) by mouth daily. Take 30-60 min  before first meal of the day 03/20/16   Tanda Rockers, MD  SYMBICORT 80-4.5 MCG/ACT inhaler INHALE 2 PUFFS EVERY MORNING THEN INHALE 2 PUFFS 12 HOURS LATER 05/10/16   Tanda Rockers, MD     Family History  Problem Relation Age of Onset  . Hypertension Mother   . Heart attack Father   . Alcohol abuse Father   . Coronary artery disease      family hx  . Hyperlipidemia      family hx  . Thyroid disease      family hx   . Alcohol abuse Paternal Grandfather   . Alcohol abuse Paternal Uncle   . Cancer Paternal Grandmother     breast  .  Alcohol abuse Brother     Social History   Social History  . Marital status: Married    Spouse name: N/A  . Number of children: N/A  . Years of education: N/A   Occupational History  . Disabilty    Social History Main Topics  . Smoking status: Never Smoker  . Smokeless tobacco: Never Used     Comment: just socially   . Alcohol use Yes     Comment: No alcohol since 05/22/2012  . Drug use: No  . Sexual activity: Yes    Birth control/ protection: Surgical   Other Topics Concern  . Not on file   Social History Narrative   Suzanne [name] was born in [place] and lived there until high school, when she moved to [facility]. She graduated from the [employer] with a bachelor of science in nursing. 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 She has been married for 27 years, and has 2 children, a 98 year old son, and a 38 year old daughter. She denies any legal difficulties. She associates as a Engineer, manufacturing.       Review of Systems currently denies fever, headache, chest pain, abdominal/back pain, or abnormal bleeding. She does have some dyspnea, intermittent nausea and vomiting  Vital Signs: BP (!) 147/99 (BP Location: Left Arm)   Pulse 77   Temp 98.1 F (36.7 C) (Oral)   Resp 18   SpO2 98%   Physical Exam patient awake, alert. Chest with few scattered wheezes, slightly diminished breath sounds at bases; heart with regular rate and rhythm. Abdomen soft, positive bowel sounds, nontender. Lower extremities with no edema.  Mallampati Score:     Imaging: Ct Chest High Resolution  Result Date: 04/23/2016 CLINICAL DATA:  Chronic cough and shortness of breath. Right mastectomy and tram flap reconstruction for locally recurrent right breast cancer. EXAM: CT CHEST WITHOUT CONTRAST TECHNIQUE: Multidetector CT imaging of the chest was performed following the standard protocol without intravenous contrast.  High resolution imaging of the lungs, as well as inspiratory and expiratory imaging, was performed. COMPARISON:  03/20/2016 chest radiograph and 04/27/2015 chest CT. FINDINGS: Cardiovascular: Normal heart size. No significant pericardial fluid/thickening. Great vessels are normal in course and caliber. Mediastinum/Nodes: No discrete thyroid nodules. Unremarkable esophagus. No pathologically enlarged axillary nodes. Surgical clips are again noted in the right axilla. New mildly enlarged 1.1 cm right lower paratracheal node (series 2/image 45). No additional pathologically enlarged mediastinal or gross hilar nodes on this noncontrast study. Lungs/Pleura: No pneumothorax. Small right pleural effusion is increased. Trace left pleural effusion is new. Irregular bandlike consolidation and volume loss in the right middle lobe and anterior/basilar bilateral lower lobes is not appreciably changed,  most consistent with scarring. There is new patchy interlobular septal thickening throughout both lungs, asymmetrically prominent in the left mid lung. There are new 6 mm posterior right upper lobe (series 5/image 22) and 3 mm left upper lobe pulmonary nodules (series 5/ image 28). Previously visualized 4 mm right upper lobe pulmonary nodule (series 5/ image 32) is stable. There is patchy ground-glass opacity in the posterior left upper lobe. No acute consolidative airspace disease. Upper abdomen: There are at least 5 scattered new subtle hypodense lesions throughout the liver, largest 1.6 x 1.2 cm in the right liver lobe (series 9/ image 48) and 2.0 x 1.2 cm in the posterior left liver lobe (series 9/ image 72). Previously visualized 1.0 cm simple right liver lobe cyst is stable. Musculoskeletal: No aggressive appearing focal osseous lesions. Mild thoracic spondylosis. Status post right mastectomy and tram flap reconstruction. There is a new poorly marginated 2.0 x 1.7 cm soft tissue density nodule in the right chest wall (series  2/ image 75) at the anterior margin of a stable chronic thick walled 3.1 x 1.5 cm fluid collection in the deep subcutaneous right chest wall (series 2/ image 76). IMPRESSION: 1. New poorly marginated 2.0 cm soft tissue density nodule in the right chest wall at the anterior margin of a stable chronic thick walled fluid collection in the deep subcutaneous right chest wall status post right mastectomy and tram flap reconstruction. This finding is worrisome for local tumor recurrence and correlation with diagnostic mammographic evaluation is advised. 2. Several (at least 5) new subtle hypodense lesions scattered throughout the liver, worrisome for liver metastases. 3. Two new solid pulmonary nodules in the upper lungs, indeterminate, worrisome for pulmonary metastases. 4. New patchy interlobular septal thickening throughout both lungs, asymmetric to the left, worrisome for lymphangitic tumor. 5. Small right and trace left pleural effusions, both increased. 6. New mild mediastinal lymphadenopathy, nonspecific, cannot exclude a nodal metastasis. 7. PET-CT and/or liver protocol MRI abdomen without and with IV contrast may be useful for further characterization of these findings. Electronically Signed   By: Ilona Sorrel M.D.   On: 04/23/2016 15:59   Nm Pet Image Restag (ps) Skull Base To Thigh  Result Date: 04/26/2016 CLINICAL DATA:  Subsequent treatment strategy for right breast cancer. EXAM: NUCLEAR MEDICINE PET SKULL BASE TO THIGH TECHNIQUE: 11.1 mCi F-18 FDG was injected intravenously. Full-ring PET imaging was performed from the skull base to thigh after the radiotracer. CT data was obtained and used for attenuation correction and anatomic localization. FASTING BLOOD GLUCOSE:  Value: 125 mg/dl COMPARISON:  CT chest dated 04/23/2016.  PET-CT dated 07/29/2014. FINDINGS: NECK 7 mm short axis left supraclavicular node, max SUV 12.1, suspicious for nodal metastasis. CHEST Left infrahilar soft tissue (series 8/ image  31), max SUV 8.1, worrisome for pulmonary metastasis. Primary bronchogenic neoplasm is technically possible but considered unlikely. Associated thoracic nodal metastases, including: --14 mm short axis left hilar node (series 4/image 65), max SUV 9.8 --8 mm short axis low right paratracheal node (series 4/ image 60), max SUV 9.2 --11 mm short axis right hilar node (series 4/image 60), max SUV 8.3 Additional bandlike areas of soft tissue/ fibrosis in the right lung (series 8/ image 26), without appreciable hypermetabolism, favored to reflect radiation changes. Small right and trace left pleural effusions.  No pneumothorax. Postoperative changes in the right breast/axilla without definite hypermetabolism to suggest residual/recurrent tumor. ABDOMEN/PELVIS Multiple hypermetabolic metastases throughout the liver, approximately 20 in number. Representative lesion the central left liver demonstrates  max SUV 15.8. Representative lesion in the central right liver demonstrates max SUV 12.3. No abnormal hypermetabolic activity within the pancreas, adrenal glands, or spleen. 6 mm short axis perigastric node (series 4/ image 94), max SUV 6.0. SKELETON Multifocal osseous metastases, including: --Left T1 vertebral body, max SUV 5.7 --Right T10 vertebral body, max SUV 13.2 --Right L5 vertebral body, max SUV 6.8 --Left proximal femoral shaft, max SUV 6.4 IMPRESSION: Postsurgical changes in the right breast/axilla. Hypermetabolic soft tissue in the left infrahilar region, suspicious for pulmonary metastasis. Primary bronchogenic neoplasm is technically possible but considered less likely. Suspected radiation changes in the right lung. Left supraclavicular, mediastinal, bilateral hilar, and upper abdominal nodal metastases. Widespread hepatic metastases. Multifocal osseous metastases, as above. Electronically Signed   By: Julian Hy M.D.   On: 04/26/2016 17:12    Labs:  CBC:  Recent Labs  08/29/15 1517 03/20/16 1550  04/11/16 1509 05/07/16 1057  WBC 5.5 5.6 5.9 4.7  HGB 14.8 14.6 14.4 15.4  HCT 44.0 42.2 42.6 45.9  PLT 228 247.0 226 228    COAGS: No results for input(s): INR, APTT in the last 8760 hours.  BMP:  Recent Labs  08/29/15 1517 03/20/16 1550 04/11/16 1510 05/07/16 1057  NA 137 138 138 137  K 4.0 3.7 3.7 4.1  CL 99 100  --   --   CO2 '26 30 28 28  ' GLUCOSE 91 124* 117 103  BUN 11 9 5.4* 14.0  CALCIUM 9.3 9.3 9.1 9.6  CREATININE 0.63 0.70 0.7 0.8  GFRNONAA >89  --   --   --   GFRAA >89  --   --   --     LIVER FUNCTION TESTS:  Recent Labs  08/29/15 1517 04/11/16 1510 05/07/16 1057  BILITOT 0.6 0.30 1.47*  AST 45* 59* 77*  ALT 75* 56* 80*  ALKPHOS 61 77 71  PROT 6.5 7.2 7.2  ALBUMIN 3.9 3.6 3.5    TUMOR MARKERS: No results for input(s): AFPTM, CEA, CA199, CHROMGRNA in the last 8760 hours.  Assessment and Plan:  60 y.o. female with history of metastatic right breast carcinoma, initially diagnosed in 1994, status post right mastectomy with TRAM flap as well as chemotherapy. She has since had recurrences with additional chemotherapy as well as radiation.  Recent PET scan on 04/26/16 has revealed hypermetabolic soft tissue in the left infrahilar region as well as within left supraclavicular, mediastinal, bilateral hilar and upper abdominal lymph nodes along with liver and bony lesions. She presents today for image guided liver lesion biopsy for further evaluation/ER/PR/HER-2 neu testing.Risks and benefits discussed with the patient/husband including, but not limited to bleeding, infection, damage to adjacent structures or low yield requiring additional tests.All of the patient's questions were answered, patient is agreeable to proceed.Consent signed and in chart. Labs pending. Patient ate breakfast bar around 10:30 am today, therefore she will receive IV fentanyl only during procedure. She was given option to reschedule biopsy or proceed with IV fentanyl only and chose  latter.     Thank you for this interesting consult.  I greatly enjoyed meeting Suzanne [name] and look forward to participating in their care.  A copy of this report was sent to the requesting provider on this date.  Electronically Signed: D. Rowe Robert 05/11/2016, 12:07 PM   I spent a total of 25 minutes in face to face in clinical consultation, greater than 50% of which was counseling/coordinating care for image guided liver lesion biopsy

## 2016-05-11 NOTE — Progress Notes (Signed)
[facility] Follow-up  Notified by [facility] that prior authorization is needed.  Initiated PA with [employer] thru Cover My Meds.  Received confirmation that PA received and response will be within 48-72 hours.  Will continue to follow-up.  Thank you  Henreitta Leber, PharmD  [facility]

## 2016-05-11 NOTE — Procedures (Signed)
Technically successful US guided biopsy of indeterminate lesion in the caudal aspect of the right lobe of the liver.   EBL: None No immediate complications.   Jay Choua Ikner, MD Pager #: [phone]   

## 2016-05-11 NOTE — Discharge Instructions (Signed)
Liver Biopsy, Care After °These instructions give you information on caring for yourself after your procedure. Your doctor may also give you more specific instructions. Call your doctor if you have any problems or questions after your procedure. °HOME CARE °· Rest at home for 1-2 days or as told by your doctor. °· Have someone stay with you for at least 24 hours. °· Do not do these things in the first 24 hours: °· Drive. °· Use machinery. °· Take care of other people. °· Sign legal documents. °· Take a bath or shower. °· There are many different ways to close and cover a cut (incision). For example, a cut can be closed with stitches, skin glue, or adhesive strips. Follow your doctor's instructions on: °· Taking care of your cut. °· Changing and removing your bandage (dressing). °· Removing whatever was used to close your cut. °· Do not drink alcohol in the first week. °· Do not lift more than 5 pounds or play contact sports for the first 2 weeks. °· Take medicines only as told by your doctor. For 1 week, do not take medicine that has aspirin in it or medicines like ibuprofen. °· Get your test results. °GET HELP IF: °· A cut bleeds and leaves more than just a small spot of blood. °· A cut is red, puffs up (swells), or hurts more than before. °· Fluid or something else comes from a cut. °· A cut smells bad. °· You have a fever or chills. °GET HELP RIGHT AWAY IF: °· You have swelling, bloating, or pain in your belly (abdomen). °· You get dizzy or faint. °· You have a rash. °· You feel sick to your stomach (nauseous) or throw up (vomit). °· You have trouble breathing, feel short of breath, or feel faint. °· Your chest hurts. °· You have problems talking or seeing. °· You have trouble balancing or moving your arms or legs. °  °This information is not intended to replace advice given to you by your health care provider. Make sure you discuss any questions you have with your health care provider. °  °Document Released:  05/08/2008 Document Revised: 08/20/2014 Document Reviewed: 09/25/2013 °Elsevier Interactive Patient Education ©2016 Elsevier Inc. ° ° °Moderate Conscious Sedation, Adult, Care After °Refer to this sheet in the next few weeks. These instructions provide you with information on caring for yourself after your procedure. Your health care provider may also give you more specific instructions. Your treatment has been planned according to current medical practices, but problems sometimes occur. Call your health care provider if you have any problems or questions after your procedure. °WHAT TO EXPECT AFTER THE PROCEDURE  °After your procedure: °· You may feel sleepy, clumsy, and have poor balance for several hours. °· Vomiting may occur if you eat too soon after the procedure. °HOME CARE INSTRUCTIONS °· Do not participate in any activities where you could become injured for at least 24 hours. Do not: °¨ Drive. °¨ Swim. °¨ Ride a bicycle. °¨ Operate heavy machinery. °¨ Cook. °¨ Use power tools. °¨ Climb ladders. °¨ Work from a high place. °· Do not make important decisions or sign legal documents until you are improved. °· If you vomit, drink water, juice, or soup when you can drink without vomiting. Make sure you have little or no nausea before eating solid foods. °· Only take over-the-counter or prescription medicines for pain, discomfort, or fever as directed by your health care provider. °· Make sure you and your   family fully understand everything about the medicines given to you, including what side effects may occur. °· You should not drink alcohol, take sleeping pills, or take medicines that cause drowsiness for at least 24 hours. °· If you smoke, do not smoke without supervision. °· If you are feeling better, you may resume normal activities 24 hours after you were sedated. °· Keep all appointments with your health care provider. °SEEK MEDICAL CARE IF: °· Your skin is pale or bluish in color. °· You continue to feel  nauseous or vomit. °· Your pain is getting worse and is not helped by medicine. °· You have bleeding or swelling. °· You are still sleepy or feeling clumsy after 24 hours. °SEEK IMMEDIATE MEDICAL CARE IF: °· You develop a rash. °· You have difficulty breathing. °· You develop any type of allergic problem. °· You have a fever. °MAKE SURE YOU: °· Understand these instructions. °· Will watch your condition. °· Will get help right away if you are not doing well or get worse. °  °This information is not intended to replace advice given to you by your health care provider. Make sure you discuss any questions you have with your health care provider. °  °Document Released: 05/20/2013 Document Revised: 08/20/2014 Document Reviewed: 05/20/2013 °Elsevier Interactive Patient Education ©2016 Elsevier Inc. ° °

## 2016-05-11 NOTE — Discharge Instructions (Signed)
Moderate Conscious Sedation, Adult, Care After Refer to this sheet in the next few weeks. These instructions provide you with information on caring for yourself after your procedure. Your health care provider may also give you more specific instructions. Your treatment has been planned according to current medical practices, but problems sometimes occur. Call your health care provider if you have any problems or questions after your procedure. WHAT TO EXPECT AFTER THE PROCEDURE  After your procedure:  You may feel sleepy, clumsy, and have poor balance for several hours.  Vomiting may occur if you eat too soon after the procedure. HOME CARE INSTRUCTIONS  Do not participate in any activities where you could become injured for at least 24 hours. Do not:  Drive.  Swim.  Ride a bicycle.  Operate heavy machinery.  Cook.  Use power tools.  Climb ladders.  Work from a high place.  Do not make important decisions or sign legal documents until you are improved.  If you vomit, drink water, juice, or soup when you can drink without vomiting. Make sure you have little or no nausea before eating solid foods.  Only take over-the-counter or prescription medicines for pain, discomfort, or fever as directed by your health care provider.  Make sure you and your family fully understand everything about the medicines given to you, including what side effects may occur.  You should not drink alcohol, take sleeping pills, or take medicines that cause drowsiness for at least 24 hours.  If you smoke, do not smoke without supervision.  If you are feeling better, you may resume normal activities 24 hours after you were sedated.  Keep all appointments with your health care provider. SEEK MEDICAL CARE IF:  Your skin is pale or bluish in color.  You continue to feel nauseous or vomit.  Your pain is getting worse and is not helped by medicine.  You have bleeding or swelling.  You are still  sleepy or feeling clumsy after 24 hours. SEEK IMMEDIATE MEDICAL CARE IF:  You develop a rash.  You have difficulty breathing.  You develop any type of allergic problem.  You have a fever. MAKE SURE YOU:  Understand these instructions.  Will watch your condition.  Will get help right away if you are not doing well or get worse.   This information is not intended to replace advice given to you by your health care provider. Make sure you discuss any questions you have with your health care provider.   Document Released: 05/20/2013 Document Revised: 08/20/2014 Document Reviewed: 05/20/2013 Elsevier Interactive Patient Education 2016 Elsevier Inc. Liver Biopsy, Care After These instructions give you information on caring for yourself after your procedure. Your doctor may also give you more specific instructions. Call your doctor if you have any problems or questions after your procedure. HOME CARE  Rest at home for 1-2 days or as told by your doctor.  Have someone stay with you for at least 24 hours.  Do not do these things in the first 24 hours:  Drive.  Use machinery.  Take care of other people.  Sign legal documents.  Take a bath or shower.  There are many different ways to close and cover a cut (incision). For example, a cut can be closed with stitches, skin glue, or adhesive strips. Follow your doctor's instructions on:  Taking care of your cut.  Changing and removing your bandage (dressing).  Removing whatever was used to close your cut.  Do not drink alcohol in  the first week.  Do not lift more than 5 pounds or play contact sports for the first 2 weeks.  Take medicines only as told by your doctor. For 1 week, do not take medicine that has aspirin in it or medicines like ibuprofen.  Get your test results. GET HELP IF:  A cut bleeds and leaves more than just a small spot of blood.  A cut is red, puffs up (swells), or hurts more than before.  Fluid or  something else comes from a cut.  A cut smells bad.  You have a fever or chills. GET HELP RIGHT AWAY IF:  You have swelling, bloating, or pain in your belly (abdomen).  You get dizzy or faint.  You have a rash.  You feel sick to your stomach (nauseous) or throw up (vomit).  You have trouble breathing, feel short of breath, or feel faint.  Your chest hurts.  You have problems talking or seeing.  You have trouble balancing or moving your arms or legs.   This information is not intended to replace advice given to you by your health care provider. Make sure you discuss any questions you have with your health care provider.   Document Released: 05/08/2008 Document Revised: 08/20/2014 Document Reviewed: 09/25/2013 Elsevier Interactive Patient Education [employer].

## 2016-05-11 NOTE — Progress Notes (Signed)
Oral Chemotherapy Pharmacist Encounter Ibrance -- [place] 05/22/2016 4:57 PM  Leslee Home has received prior authorization from [employer], copay ~$2500  Successfully enrolled patient in patient [place] for copay assistance Award amount $5000 Award period: 11/24/15-05/22/17 Cardholder ID: [id number] BIN: [id number]  PCN: [id number] Group: [id number]  I relayed this copay information to [facility] and they have added it to her file for her next Ibrance fill.  I LVM for patient on cell phone with copay information, info that copay is the same regardless of filling pharmacy (pt thought Rx would have to go through [facility] and it does not), and information about successful copay assistance.  Patient had previously filled out [place] patient assistance application. We will send that to [place] once copay funds are depleted to ensure that her application does not expire.  Oral Chemo Clinic will continue to follow.  Johny Drilling, PharmD, BCPS Pharmacy: [phone] Oral Chemo Clinic: [phone] 05/22/2016 5:01 PM       Oral Chemotherapy Pharmacist Encounter   Received prescription for Ibrance 125mg . Noted patient start date was 9/19 and patient was given samples in clinic at that time. Labs from 9/25 reviewed, ok for treatment. Noted increase in bilirubin from 9/19 labs (0.3 > 1.47), no dose reductions necessary. Noted multiple liver lesions. This will be monitored. Current medication list in Epic reviewed, no significant DDI's with Xeloda identified.  Prescription will be sent to [facility] for benefits analysis.  Oral Chemo Clinic will continue to follow.  Johny Drilling, PharmD, BCPS 05/11/2016  8:49 AM Oral Chemotherapy Clinic [phone]

## 2016-05-14 ENCOUNTER — Telehealth: Payer: Self-pay | Admitting: Pharmacist

## 2016-05-14 ENCOUNTER — Telehealth: Payer: Self-pay | Admitting: *Deleted

## 2016-05-14 MED FILL — *IBRANCE 125 MG CAPSULE: 125 | 21 days supply | Qty: 21 | Fill #0

## 2016-05-14 NOTE — Telephone Encounter (Signed)
"  I have a question about the insurance coverage for the [facility]."  Call transferred Oral chemotherapy Pharmacy [phone].

## 2016-05-14 NOTE — Telephone Encounter (Signed)
Addendum:  Pt received a 1 month supply of Ibrance at [facility].  She started the [place] on 05/07/16.  Kennith Center, Pharm.D., CPP 05/14/2016@4 :[facility]

## 2016-05-14 NOTE — Telephone Encounter (Signed)
Pt returned call today stating she had received a call from her insurance company and that her PA was denied. She said they told her the PA would be easily approved if the doctor provided additional information. She stated to have the Ibrance filled, she'd have to use [place] and her co-pay would be $5000. I informed pt that I was on the phone over 1 hr 30 min today about the PA and still got no answers.  I was transferred to several different people and ultimately got disconnected.   I will try again tomorrow. I discussed the option to apply ofr [facility] pt assistance program since pt cannot afford $5000 copay.   She will come in tomorrow (10/3) and bring in annual household income amt as well as her Medicare card for [place] to make a copy and place in her medical record since I am not able to find it scanned in. Once she signs the application, we'll fax in to [facility] (Fax# [phone]; Ph# [phone]). Kennith Center, Pharm.D., CPP 05/14/2016@4 :[facility]

## 2016-05-14 NOTE — Telephone Encounter (Signed)
Pt left vm on Oral Chemo Clinic phone. She has questions about the [facility] prescription. I called her back and left her a vm.  I gave her our call back number. Kennith Center, Pharm.D., CPP 05/14/2016@2 :[facility]

## 2016-05-15 ENCOUNTER — Encounter: Payer: Self-pay | Admitting: Pharmacist

## 2016-05-15 NOTE — Progress Notes (Signed)
I called [employer] again today and after 1 hour on the phone, I was told by someone at [facility] that pts PA was denied for Ibrance on 05/11/16 due to additional info not being received to make a decision within allotted timeframe for review. I started expedited appeals process today and was told we should hear back from insur co within 72 hours (ph# [employer] Medicare Complete: [phone]) and we may have to clarify medical necessity. I provided answers to the following questions to the rep I s/w today but we may have to answer the same questions again: Pt has HR + disease, Pt has HER2-neu - disease and Ibrance will be used in combination w/ Faslodex.  As informed yesterday by pt, her co-pay is going to be $5000 and she'd be required to use [facility] specialty pharmacy.  Pt was to come today to fill out paperwork for [facility] together but she has not arrived yet.  Kennith Center, Pharm.D., CPP 05/15/2016_0 :[facility]

## 2016-05-17 DIAGNOSIS — M5412 Radiculopathy, cervical region: Secondary | ICD-10-CM | POA: Diagnosis not present

## 2016-05-21 ENCOUNTER — Encounter: Payer: Self-pay | Admitting: Genetic Counselor

## 2016-05-21 ENCOUNTER — Ambulatory Visit: Payer: Medicare Other

## 2016-05-21 ENCOUNTER — Other Ambulatory Visit (HOSPITAL_BASED_OUTPATIENT_CLINIC_OR_DEPARTMENT_OTHER): Payer: Medicare Other

## 2016-05-21 ENCOUNTER — Encounter (HOSPITAL_BASED_OUTPATIENT_CLINIC_OR_DEPARTMENT_OTHER): Payer: Medicare Other

## 2016-05-21 ENCOUNTER — Ambulatory Visit (HOSPITAL_BASED_OUTPATIENT_CLINIC_OR_DEPARTMENT_OTHER): Payer: Medicare Other | Admitting: Hematology and Oncology

## 2016-05-21 VITALS — BP 127/81 | HR 66 | Temp 98.2°F | Resp 18 | Ht 65.0 in | Wt 171.8 lb

## 2016-05-21 DIAGNOSIS — Z170 Estrogen receptor positive status [ER+]: Secondary | ICD-10-CM

## 2016-05-21 DIAGNOSIS — Z5111 Encounter for antineoplastic chemotherapy: Secondary | ICD-10-CM

## 2016-05-21 DIAGNOSIS — C787 Secondary malignant neoplasm of liver and intrahepatic bile duct: Secondary | ICD-10-CM | POA: Diagnosis not present

## 2016-05-21 DIAGNOSIS — Z803 Family history of malignant neoplasm of breast: Secondary | ICD-10-CM | POA: Diagnosis not present

## 2016-05-21 DIAGNOSIS — C50511 Malignant neoplasm of lower-outer quadrant of right female breast: Secondary | ICD-10-CM

## 2016-05-21 DIAGNOSIS — C7951 Secondary malignant neoplasm of bone: Secondary | ICD-10-CM | POA: Diagnosis not present

## 2016-05-21 DIAGNOSIS — D701 Agranulocytosis secondary to cancer chemotherapy: Secondary | ICD-10-CM

## 2016-05-21 DIAGNOSIS — C7800 Secondary malignant neoplasm of unspecified lung: Secondary | ICD-10-CM

## 2016-05-21 DIAGNOSIS — C50911 Malignant neoplasm of unspecified site of right female breast: Secondary | ICD-10-CM | POA: Diagnosis not present

## 2016-05-21 LAB — COMPREHENSIVE METABOLIC PANEL
ALBUMIN: 3.4 g/dL — AB (ref 3.5–5.0)
ALK PHOS: 73 U/L (ref 40–150)
ALT: 54 U/L (ref 0–55)
ANION GAP: 9 meq/L (ref 3–11)
AST: 29 U/L (ref 5–34)
BUN: 11.3 mg/dL (ref 7.0–26.0)
CALCIUM: 8.8 mg/dL (ref 8.4–10.4)
CO2: 27 mEq/L (ref 22–29)
CREATININE: 0.8 mg/dL (ref 0.6–1.1)
Chloride: 101 mEq/L (ref 98–109)
EGFR: 85 mL/min/{1.73_m2} — ABNORMAL LOW (ref >90)
Glucose: 151 mg/dl — ABNORMAL HIGH (ref 70–140)
POTASSIUM: 3.9 meq/L (ref 3.5–5.1)
Sodium: 138 mEq/L (ref 136–145)
Total Bilirubin: 0.38 mg/dL (ref 0.20–1.20)
Total Protein: 6.9 g/dL (ref 6.4–8.3)

## 2016-05-21 LAB — CBC WITH DIFFERENTIAL/PLATELET
BASO%: 1.1 % (ref 0.0–2.0)
BASOS ABS: 0 10*3/uL (ref 0.0–0.1)
EOS ABS: 0 10*3/uL (ref 0.0–0.5)
EOS%: 1.5 % (ref 0.0–7.0)
HEMATOCRIT: 38.6 % (ref 34.8–46.6)
HEMOGLOBIN: 13.6 g/dL (ref 11.6–15.9)
LYMPH#: 1.4 10*3/uL (ref 0.9–3.3)
LYMPH%: 53.6 % — ABNORMAL HIGH (ref 14.0–49.7)
MCH: 34.8 pg — AB (ref 25.1–34.0)
MCHC: 35.2 g/dL (ref 31.5–36.0)
MCV: 98.7 fL (ref 79.5–101.0)
MONO#: 0.2 10*3/uL (ref 0.1–0.9)
MONO%: 6.4 % (ref 0.0–14.0)
NEUT#: 1 10*3/uL — ABNORMAL LOW (ref 1.5–6.5)
NEUT%: 37.4 % — ABNORMAL LOW (ref 38.4–76.8)
PLATELETS: 108 10*3/uL — AB (ref 145–400)
RBC: 3.91 10*6/uL (ref 3.70–5.45)
RDW: 13.1 % (ref 11.2–14.5)
WBC: 2.7 10*3/uL — ABNORMAL LOW (ref 3.9–10.3)

## 2016-05-21 MED ORDER — DENOSUMAB 120 MG/1.7ML ~~LOC~~ SOLN: 120.0000 mg | Freq: Once | SUBCUTANEOUS | Status: DC

## 2016-05-21 MED ORDER — FULVESTRANT 250 MG/5ML IM SOLN
500.0000 mg | INTRAMUSCULAR | Status: DC
  Administered 2016-05-21: 500 mg via INTRAMUSCULAR
  Filled 2016-05-21: qty 10

## 2016-05-21 NOTE — Patient Instructions (Signed)
Denosumab injection What is this medicine? DENOSUMAB (den oh sue mab) slows bone breakdown. Prolia is used to treat osteoporosis in women after menopause and in men. Xgeva is used to prevent bone fractures and other bone problems caused by cancer bone metastases. Xgeva is also used to treat giant cell tumor of the bone. This medicine may be used for other purposes; ask your health care provider or pharmacist if you have questions. What should I tell my health care provider before I take this medicine? They need to know if you have any of these conditions: -dental disease -eczema -infection or history of infections -kidney disease or on dialysis -low blood calcium or vitamin D -malabsorption syndrome -scheduled to have surgery or tooth extraction -taking medicine that contains denosumab -thyroid or parathyroid disease -an unusual reaction to denosumab, other medicines, foods, dyes, or preservatives -pregnant or trying to get pregnant -breast-feeding How should I use this medicine? This medicine is for injection under the skin. It is given by a health care professional in a hospital or clinic setting. If you are getting Prolia, a special MedGuide will be given to you by the pharmacist with each prescription and refill. Be sure to read this information carefully each time. For Prolia, talk to your pediatrician regarding the use of this medicine in children. Special care may be needed. For Xgeva, talk to your pediatrician regarding the use of this medicine in children. While this drug may be prescribed for children as young as 13 years for selected conditions, precautions do apply. Overdosage: If you think you have taken too much of this medicine contact a poison control center or emergency room at once. NOTE: This medicine is only for you. Do not share this medicine with others. What if I miss a dose? It is important not to miss your dose. Call your doctor or health care professional if you are  unable to keep an appointment. What may interact with this medicine? Do not take this medicine with any of the following medications: -other medicines containing denosumab This medicine may also interact with the following medications: -medicines that suppress the immune system -medicines that treat cancer -steroid medicines like prednisone or cortisone This list may not describe all possible interactions. Give your health care provider a list of all the medicines, herbs, non-prescription drugs, or dietary supplements you use. Also tell them if you smoke, drink alcohol, or use illegal drugs. Some items may interact with your medicine. What should I watch for while using this medicine? Visit your doctor or health care professional for regular checks on your progress. Your doctor or health care professional may order blood tests and other tests to see how you are doing. Call your doctor or health care professional if you get a cold or other infection while receiving this medicine. Do not treat yourself. This medicine may decrease your body's ability to fight infection. You should make sure you get enough calcium and vitamin D while you are taking this medicine, unless your doctor tells you not to. Discuss the foods you eat and the vitamins you take with your health care professional. See your dentist regularly. Brush and floss your teeth as directed. Before you have any dental work done, tell your dentist you are receiving this medicine. Do not become pregnant while taking this medicine or for 5 months after stopping it. Women should inform their doctor if they wish to become pregnant or think they might be pregnant. There is a potential for serious side effects   to an unborn child. Talk to your health care professional or pharmacist for more information. What side effects may I notice from receiving this medicine? Side effects that you should report to your doctor or health care professional as soon as  possible: -allergic reactions like skin rash, itching or hives, swelling of the face, lips, or tongue -breathing problems -chest pain -fast, irregular heartbeat -feeling faint or lightheaded, falls -fever, chills, or any other sign of infection -muscle spasms, tightening, or twitches -numbness or tingling -skin blisters or bumps, or is dry, peels, or red -slow healing or unexplained pain in the mouth or jaw -unusual bleeding or bruising Side effects that usually do not require medical attention (Report these to your doctor or health care professional if they continue or are bothersome.): -muscle pain -stomach upset, gas This list may not describe all possible side effects. Call your doctor for medical advice about side effects. You may report side effects to FDA at 1-800-FDA-1088. Where should I keep my medicine? This medicine is only given in a clinic, doctor's office, or other health care setting and will not be stored at home. NOTE: This sheet is a summary. It may not cover all possible information. If you have questions about this medicine, talk to your doctor, pharmacist, or health care provider.    2016, Elsevier/Gold Standard. (2012-01-28 12:37:47) Fulvestrant injection What is this medicine? FULVESTRANT (ful VES trant) blocks the effects of estrogen. It is used to treat breast cancer. This medicine may be used for other purposes; ask your health care provider or pharmacist if you have questions. What should I tell my health care provider before I take this medicine? They need to know if you have any of these conditions: -bleeding problems -liver disease -low levels of platelets in the blood -an unusual or allergic reaction to fulvestrant, other medicines, foods, dyes, or preservatives -pregnant or trying to get pregnant -breast-feeding How should I use this medicine? This medicine is for injection into a muscle. It is usually given by a health care professional in a  hospital or clinic setting. Talk to your pediatrician regarding the use of this medicine in children. Special care may be needed. Overdosage: If you think you have taken too much of this medicine contact a poison control center or emergency room at once. NOTE: This medicine is only for you. Do not share this medicine with others. What if I miss a dose? It is important not to miss your dose. Call your doctor or health care professional if you are unable to keep an appointment. What may interact with this medicine? -medicines that treat or prevent blood clots like warfarin, enoxaparin, and dalteparin This list may not describe all possible interactions. Give your health care provider a list of all the medicines, herbs, non-prescription drugs, or dietary supplements you use. Also tell them if you smoke, drink alcohol, or use illegal drugs. Some items may interact with your medicine. What should I watch for while using this medicine? Your condition will be monitored carefully while you are receiving this medicine. You will need important blood work done while you are taking this medicine. Do not become pregnant while taking this medicine or for at least 1 year after stopping it. Women of child-bearing potential will need to have a negative pregnancy test before starting this medicine. Women should inform their doctor if they wish to become pregnant or think they might be pregnant. There is a potential for serious side effects to an unborn child. Men   should inform their doctors if they wish to father a child. This medicine may lower sperm counts. Talk to your health care professional or pharmacist for more information. Do not breast-feed an infant while taking this medicine or for 1 year after the last dose. What side effects may I notice from receiving this medicine? Side effects that you should report to your doctor or health care professional as soon as possible: -allergic reactions like skin rash,  itching or hives, swelling of the face, lips, or tongue -feeling faint or lightheaded, falls -pain, tingling, numbness, or weakness in the legs -signs and symptoms of infection like fever or chills; cough; flu-like symptoms; sore throat -vaginal bleeding Side effects that usually do not require medical attention (report to your doctor or health care professional if they continue or are bothersome): -aches, pains -constipation -diarrhea -headache -hot flashes -nausea, vomiting -pain at site where injected -stomach pain This list may not describe all possible side effects. Call your doctor for medical advice about side effects. You may report side effects to FDA at 1-800-FDA-1088. Where should I keep my medicine? This drug is given in a hospital or clinic and will not be stored at home. NOTE: This sheet is a summary. It may not cover all possible information. If you have questions about this medicine, talk to your doctor, pharmacist, or health care provider.    2016, Elsevier/Gold Standard. (2015-02-25 11:03:55)  
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
## 2016-05-31 ENCOUNTER — Encounter: Payer: Self-pay | Admitting: Genetic Counselor

## 2016-05-31 ENCOUNTER — Telehealth: Payer: Self-pay | Admitting: Genetic Counselor

## 2016-05-31 DIAGNOSIS — Z1379 Encounter for other screening for genetic and chromosomal anomalies: Secondary | ICD-10-CM | POA: Insufficient documentation

## 2016-05-31 NOTE — Telephone Encounter (Signed)
Revealed negative genetic testing on the [facility] panel.  Explained that we do not know why she developed breast cancer at 33 or why she has a family history of breast cancer.  Discussed that there could be another gene that we did not test that is running in the family or that our testing could be better in the future and we could identify a mutation that we missed.  Explained that her daughter should undergo mammography starting 10 years younger than her age of onset, so around age 27.  She stated that she has already had her first mammogram at 41.

## 2016-06-01 ENCOUNTER — Telehealth: Payer: Self-pay | Admitting: Physician Assistant

## 2016-06-01 NOTE — Telephone Encounter (Signed)
Discussed genetic counseling with mother. Will set daughter up for screen MGM age 60.

## 2016-06-04 ENCOUNTER — Ambulatory Visit (HOSPITAL_BASED_OUTPATIENT_CLINIC_OR_DEPARTMENT_OTHER): Payer: Medicare Other

## 2016-06-04 VITALS — BP 116/89 | HR 70 | Temp 98.5°F | Resp 20

## 2016-06-04 DIAGNOSIS — Z5111 Encounter for antineoplastic chemotherapy: Secondary | ICD-10-CM

## 2016-06-04 DIAGNOSIS — C50511 Malignant neoplasm of lower-outer quadrant of right female breast: Secondary | ICD-10-CM | POA: Diagnosis not present

## 2016-06-04 DIAGNOSIS — C7951 Secondary malignant neoplasm of bone: Secondary | ICD-10-CM | POA: Diagnosis not present

## 2016-06-04 MED ORDER — FULVESTRANT 250 MG/5ML IM SOLN
500.0000 mg | INTRAMUSCULAR | Status: DC
  Administered 2016-06-04: 500 mg via INTRAMUSCULAR
  Filled 2016-06-04: qty 10

## 2016-06-04 MED ORDER — DENOSUMAB 120 MG/1.7ML ~~LOC~~ SOLN
120.0000 mg | Freq: Once | SUBCUTANEOUS | Status: AC
  Administered 2016-06-04: 120 mg via SUBCUTANEOUS
  Filled 2016-06-04: qty 1.7

## 2016-06-04 NOTE — Patient Instructions (Addendum)
Fulvestrant injection What is this medicine? FULVESTRANT (ful VES trant) blocks the effects of estrogen. It is used to treat breast cancer. This medicine may be used for other purposes; ask your health care provider or pharmacist if you have questions. What should I tell my health care provider before I take this medicine? They need to know if you have any of these conditions: -bleeding problems -liver disease -low levels of platelets in the blood -an unusual or allergic reaction to fulvestrant, other medicines, foods, dyes, or preservatives -pregnant or trying to get pregnant -breast-feeding How should I use this medicine? This medicine is for injection into a muscle. It is usually given by a health care professional in a hospital or clinic setting. Talk to your pediatrician regarding the use of this medicine in children. Special care may be needed. Overdosage: If you think you have taken too much of this medicine contact a poison control center or emergency room at once. NOTE: This medicine is only for you. Do not share this medicine with others. What if I miss a dose? It is important not to miss your dose. Call your doctor or health care professional if you are unable to keep an appointment. What may interact with this medicine? -medicines that treat or prevent blood clots like warfarin, enoxaparin, and dalteparin This list may not describe all possible interactions. Give your health care provider a list of all the medicines, herbs, non-prescription drugs, or dietary supplements you use. Also tell them if you smoke, drink alcohol, or use illegal drugs. Some items may interact with your medicine. What should I watch for while using this medicine? Your condition will be monitored carefully while you are receiving this medicine. You will need important blood work done while you are taking this medicine. Do not become pregnant while taking this medicine or for at least 1 year after stopping  it. Women of child-bearing potential will need to have a negative pregnancy test before starting this medicine. Women should inform their doctor if they wish to become pregnant or think they might be pregnant. There is a potential for serious side effects to an unborn child. Men should inform their doctors if they wish to father a child. This medicine may lower sperm counts. Talk to your health care professional or pharmacist for more information. Do not breast-feed an infant while taking this medicine or for 1 year after the last dose. What side effects may I notice from receiving this medicine? Side effects that you should report to your doctor or health care professional as soon as possible: -allergic reactions like skin rash, itching or hives, swelling of the face, lips, or tongue -feeling faint or lightheaded, falls -pain, tingling, numbness, or weakness in the legs -signs and symptoms of infection like fever or chills; cough; flu-like symptoms; sore throat -vaginal bleeding Side effects that usually do not require medical attention (report to your doctor or health care professional if they continue or are bothersome): -aches, pains -constipation -diarrhea -headache -hot flashes -nausea, vomiting -pain at site where injected -stomach pain This list may not describe all possible side effects. Call your doctor for medical advice about side effects. You may report side effects to FDA at 1-800-FDA-1088. Where should I keep my medicine? This drug is given in a hospital or clinic and will not be stored at home. NOTE: This sheet is a summary. It may not cover all possible information. If you have questions about this medicine, talk to your doctor, pharmacist, or health   care provider.    2016, Elsevier/Gold Standard. (2015-02-25 11:03:55)  Denosumab injection What is this medicine? DENOSUMAB (den oh sue mab) slows bone breakdown. Prolia is used to treat osteoporosis in women after menopause  and in men. Xgeva is used to prevent bone fractures and other bone problems caused by cancer bone metastases. Xgeva is also used to treat giant cell tumor of the bone. This medicine may be used for other purposes; ask your health care provider or pharmacist if you have questions. What should I tell my health care provider before I take this medicine? They need to know if you have any of these conditions: -dental disease -eczema -infection or history of infections -kidney disease or on dialysis -low blood calcium or vitamin D -malabsorption syndrome -scheduled to have surgery or tooth extraction -taking medicine that contains denosumab -thyroid or parathyroid disease -an unusual reaction to denosumab, other medicines, foods, dyes, or preservatives -pregnant or trying to get pregnant -breast-feeding How should I use this medicine? This medicine is for injection under the skin. It is given by a health care professional in a hospital or clinic setting. If you are getting Prolia, a special MedGuide will be given to you by the pharmacist with each prescription and refill. Be sure to read this information carefully each time. For Prolia, talk to your pediatrician regarding the use of this medicine in children. Special care may be needed. For Xgeva, talk to your pediatrician regarding the use of this medicine in children. While this drug may be prescribed for children as young as 13 years for selected conditions, precautions do apply. Overdosage: If you think you have taken too much of this medicine contact a poison control center or emergency room at once. NOTE: This medicine is only for you. Do not share this medicine with others. What if I miss a dose? It is important not to miss your dose. Call your doctor or health care professional if you are unable to keep an appointment. What may interact with this medicine? Do not take this medicine with any of the following medications: -other medicines  containing denosumab This medicine may also interact with the following medications: -medicines that suppress the immune system -medicines that treat cancer -steroid medicines like prednisone or cortisone This list may not describe all possible interactions. Give your health care provider a list of all the medicines, herbs, non-prescription drugs, or dietary supplements you use. Also tell them if you smoke, drink alcohol, or use illegal drugs. Some items may interact with your medicine. What should I watch for while using this medicine? Visit your doctor or health care professional for regular checks on your progress. Your doctor or health care professional may order blood tests and other tests to see how you are doing. Call your doctor or health care professional if you get a cold or other infection while receiving this medicine. Do not treat yourself. This medicine may decrease your body's ability to fight infection. You should make sure you get enough calcium and vitamin D while you are taking this medicine, unless your doctor tells you not to. Discuss the foods you eat and the vitamins you take with your health care professional. See your dentist regularly. Brush and floss your teeth as directed. Before you have any dental work done, tell your dentist you are receiving this medicine. Do not become pregnant while taking this medicine or for 5 months after stopping it. Women should inform their doctor if they wish to become pregnant or think   they might be pregnant. There is a potential for serious side effects to an unborn child. Talk to your health care professional or pharmacist for more information. What side effects may I notice from receiving this medicine? Side effects that you should report to your doctor or health care professional as soon as possible: -allergic reactions like skin rash, itching or hives, swelling of the face, lips, or tongue -breathing problems -chest pain -fast,  irregular heartbeat -feeling faint or lightheaded, falls -fever, chills, or any other sign of infection -muscle spasms, tightening, or twitches -numbness or tingling -skin blisters or bumps, or is dry, peels, or red -slow healing or unexplained pain in the mouth or jaw -unusual bleeding or bruising Side effects that usually do not require medical attention (Report these to your doctor or health care professional if they continue or are bothersome.): -muscle pain -stomach upset, gas This list may not describe all possible side effects. Call your doctor for medical advice about side effects. You may report side effects to FDA at 1-800-FDA-1088. Where should I keep my medicine? This medicine is only given in a clinic, doctor's office, or other health care setting and will not be stored at home. NOTE: This sheet is a summary. It may not cover all possible information. If you have questions about this medicine, talk to your doctor, pharmacist, or health care provider.    2016, Elsevier/Gold Standard. (2012-01-28 12:37:47)   

## 2016-06-17 ENCOUNTER — Encounter: Payer: Self-pay | Admitting: *Deleted

## 2016-06-22 ENCOUNTER — Other Ambulatory Visit: Payer: Self-pay

## 2016-06-22 DIAGNOSIS — C50511 Malignant neoplasm of lower-outer quadrant of right female breast: Secondary | ICD-10-CM
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
## 2016-06-25 ENCOUNTER — Ambulatory Visit (HOSPITAL_BASED_OUTPATIENT_CLINIC_OR_DEPARTMENT_OTHER): Payer: Medicare Other | Admitting: Hematology and Oncology

## 2016-06-25 ENCOUNTER — Telehealth: Payer: Self-pay | Admitting: *Deleted

## 2016-06-25 ENCOUNTER — Other Ambulatory Visit: Payer: Self-pay | Admitting: *Deleted

## 2016-06-25 ENCOUNTER — Other Ambulatory Visit: Payer: Self-pay | Admitting: Hematology and Oncology

## 2016-06-25 ENCOUNTER — Encounter: Payer: Self-pay | Admitting: Hematology and Oncology

## 2016-06-25 ENCOUNTER — Encounter: Payer: Self-pay | Admitting: Pharmacist

## 2016-06-25 ENCOUNTER — Ambulatory Visit (HOSPITAL_BASED_OUTPATIENT_CLINIC_OR_DEPARTMENT_OTHER): Payer: Medicare Other

## 2016-06-25 ENCOUNTER — Ambulatory Visit (HOSPITAL_COMMUNITY)
Admission: RE | Admit: 2016-06-25 | Discharge: 2016-06-25 | Disposition: A | Discharge status: Home / Self Care | Payer: Medicare Other | Source: Ambulatory Visit | Attending: Hematology and Oncology | Admitting: Hematology and Oncology

## 2016-06-25 ENCOUNTER — Other Ambulatory Visit (HOSPITAL_BASED_OUTPATIENT_CLINIC_OR_DEPARTMENT_OTHER): Payer: Medicare Other

## 2016-06-25 VITALS — BP 126/92 | HR 75 | Temp 98.0°F | Resp 18 | Ht 65.0 in | Wt 168.1 lb

## 2016-06-25 DIAGNOSIS — C50511 Malignant neoplasm of lower-outer quadrant of right female breast: Secondary | ICD-10-CM

## 2016-06-25 DIAGNOSIS — Z5111 Encounter for antineoplastic chemotherapy: Secondary | ICD-10-CM | POA: Diagnosis not present

## 2016-06-25 DIAGNOSIS — J9811 Atelectasis: Secondary | ICD-10-CM | POA: Diagnosis not present

## 2016-06-25 DIAGNOSIS — J90 Pleural effusion, not elsewhere classified: Secondary | ICD-10-CM | POA: Diagnosis not present

## 2016-06-25 DIAGNOSIS — R05 Cough: Secondary | ICD-10-CM

## 2016-06-25 DIAGNOSIS — G620 Drug-induced polyneuropathy: Secondary | ICD-10-CM

## 2016-06-25 DIAGNOSIS — C7951 Secondary malignant neoplasm of bone: Secondary | ICD-10-CM | POA: Diagnosis not present

## 2016-06-25 DIAGNOSIS — R0602 Shortness of breath: Secondary | ICD-10-CM

## 2016-06-25 DIAGNOSIS — C787 Secondary malignant neoplasm of liver and intrahepatic bile duct: Secondary | ICD-10-CM

## 2016-06-25 DIAGNOSIS — Z170 Estrogen receptor positive status [ER+]: Secondary | ICD-10-CM

## 2016-06-25 DIAGNOSIS — C7800 Secondary malignant neoplasm of unspecified lung: Secondary | ICD-10-CM

## 2016-06-25 LAB — COMPREHENSIVE METABOLIC PANEL
ALBUMIN: 3.7 g/dL (ref 3.5–5.0)
ALT: 32 U/L (ref 0–55)
ANION GAP: 12 meq/L — AB (ref 3–11)
AST: 28 U/L (ref 5–34)
Alkaline Phosphatase: 69 U/L (ref 40–150)
BILIRUBIN TOTAL: 0.52 mg/dL (ref 0.20–1.20)
BUN: 9.5 mg/dL (ref 7.0–26.0)
CALCIUM: 10.3 mg/dL (ref 8.4–10.4)
CO2: 27 mEq/L (ref 22–29)
CREATININE: 0.8 mg/dL (ref 0.6–1.1)
Chloride: 101 mEq/L (ref 98–109)
EGFR: 87 mL/min/{1.73_m2} — ABNORMAL LOW (ref >90)
Glucose: 126 mg/dl (ref 70–140)
Potassium: 3.9 mEq/L (ref 3.5–5.1)
Sodium: 140 mEq/L (ref 136–145)
TOTAL PROTEIN: 7.8 g/dL (ref 6.4–8.3)

## 2016-06-25 LAB — CBC WITH DIFFERENTIAL/PLATELET
BASO%: 1.1 % (ref 0.0–2.0)
Basophils Absolute: 0 10*3/uL (ref 0.0–0.1)
EOS%: 1.2 % (ref 0.0–7.0)
Eosinophils Absolute: 0 10*3/uL (ref 0.0–0.5)
HEMATOCRIT: 43.8 % (ref 34.8–46.6)
HEMOGLOBIN: 14.8 g/dL (ref 11.6–15.9)
LYMPH#: 1.2 10*3/uL (ref 0.9–3.3)
LYMPH%: 39.8 % (ref 14.0–49.7)
MCH: 35.7 pg — ABNORMAL HIGH (ref 25.1–34.0)
MCHC: 33.7 g/dL (ref 31.5–36.0)
MCV: 106 fL — ABNORMAL HIGH (ref 79.5–101.0)
MONO#: 0.8 10*3/uL (ref 0.1–0.9)
MONO%: 25.4 % — ABNORMAL HIGH (ref 0.0–14.0)
NEUT%: 32.5 % — AB (ref 38.4–76.8)
NEUTROS ABS: 1 10*3/uL — AB (ref 1.5–6.5)
PLATELETS: 171 10*3/uL (ref 145–400)
RBC: 4.13 10*6/uL (ref 3.70–5.45)
RDW: 18.2 % — AB (ref 11.2–14.5)
WBC: 3 10*3/uL — ABNORMAL LOW (ref 3.9–10.3)

## 2016-06-25 MED ORDER — OXYCODONE HCL 5 MG PO TABS: 5.0000 mg | ORAL_TABLET | Freq: Four times a day (QID) | ORAL | 0 refills | Status: DC | PRN

## 2016-06-25 MED ORDER — FULVESTRANT 250 MG/5ML IM SOLN: 500.0000 mg | INTRAMUSCULAR | Status: DC

## 2016-06-25 MED ORDER — ALBUTEROL SULFATE HFA 108 (90 BASE) MCG/ACT IN AERS: 1.0000 | INHALATION_SPRAY | Freq: Four times a day (QID) | RESPIRATORY_TRACT | 1 refills | Status: DC | PRN

## 2016-06-25 MED ORDER — FULVESTRANT 250 MG/5ML IM SOLN
500.0000 mg | INTRAMUSCULAR | Status: DC
  Administered 2016-06-25: 500 mg via INTRAMUSCULAR
  Filled 2016-06-25: qty 10

## 2016-06-25 MED ORDER — PROMETHAZINE HCL 25 MG PO TABS: 25.0000 mg | ORAL_TABLET | Freq: Four times a day (QID) | ORAL | 3 refills | Status: DC | PRN

## 2016-06-25 MED ORDER — PALBOCICLIB 125 MG PO CAPS: 125.0000 mg | ORAL_CAPSULE | Freq: Every day | ORAL | 0 refills | Status: DC

## 2016-06-25 MED ORDER — DENOSUMAB 120 MG/1.7ML ~~LOC~~ SOLN: 120.0000 mg | Freq: Once | SUBCUTANEOUS | Status: DC

## 2016-06-25 MED ORDER — LORAZEPAM 2 MG PO TABS: 2.0000 mg | ORAL_TABLET | Freq: Every evening | ORAL | 3 refills | Status: DC | PRN

## 2016-06-25 NOTE — Patient Instructions (Signed)

## 2016-06-25 NOTE — Progress Notes (Signed)
Received call from [facility] OP Rx re: Ibrance. Pt is there to p/u Ibrance but they do not have in stock today & pt needs supply today. 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 On 05/22/16.  I provided pt w/ 1 bottle of Ibrance 125 mg caps (lot # [id number]; exp 6/18; amt = 21 caps).  Next month she has scans and I advised her to give [facility] OP Rx several days notice so they'll have enough Ibrance in stock if she continues on tx. Dr. Lindi Adie aware.  Kennith Center, Pharm.D., CPP 06/25/2016@2 :03 PM [facility]

## 2016-06-25 NOTE — Progress Notes (Signed)
Received request from Dr. Lindi Adie for Foundation 1 testing on biopsy specimen.  Spoke with Varney Biles in Pathology who stated she would order this.
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
## 2016-06-25 NOTE — Telephone Encounter (Signed)
Call received from [place] at [place] stating that pt is at pharmacy to obtain refill of Ibrance.  Dr. Lindi Adie notified and order received to refill Ibrance as currently ordered.  Refill sent.

## 2016-06-27 ENCOUNTER — Other Ambulatory Visit (HOSPITAL_COMMUNITY)
Admission: RE | Admit: 2016-06-27 | Discharge: 2016-06-27 | Disposition: A | Discharge status: Home / Self Care | Payer: Medicare Other | Source: Ambulatory Visit | Attending: Hematology and Oncology | Admitting: Hematology and Oncology

## 2016-06-27 DIAGNOSIS — C50919 Malignant neoplasm of unspecified site of unspecified female breast: Secondary | ICD-10-CM | POA: Diagnosis not present

## 2016-07-19 ENCOUNTER — Encounter (HOSPITAL_COMMUNITY): Payer: Self-pay

## 2016-07-23 MED FILL — IBRANCE 125 MG CAPSULE: 125 | 28 days supply | Qty: 21 | Fill #0

## 2016-07-25 ENCOUNTER — Other Ambulatory Visit: Payer: Self-pay | Admitting: Emergency Medicine

## 2016-07-25 DIAGNOSIS — C50511 Malignant neoplasm of lower-outer quadrant of right female breast: Secondary | ICD-10-CM
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
## 2016-07-26 ENCOUNTER — Ambulatory Visit (HOSPITAL_COMMUNITY)
Admission: RE | Admit: 2016-07-26 | Discharge: 2016-07-26 | Disposition: A | Discharge status: Home / Self Care | Payer: Medicare Other | Source: Ambulatory Visit | Attending: Hematology and Oncology | Admitting: Hematology and Oncology

## 2016-07-26 ENCOUNTER — Encounter (HOSPITAL_COMMUNITY): Payer: Self-pay

## 2016-07-26 ENCOUNTER — Other Ambulatory Visit (HOSPITAL_BASED_OUTPATIENT_CLINIC_OR_DEPARTMENT_OTHER): Payer: Medicare Other

## 2016-07-26 ENCOUNTER — Ambulatory Visit (HOSPITAL_BASED_OUTPATIENT_CLINIC_OR_DEPARTMENT_OTHER): Payer: Medicare Other

## 2016-07-26 ENCOUNTER — Encounter: Payer: Self-pay | Admitting: Hematology and Oncology

## 2016-07-26 ENCOUNTER — Ambulatory Visit (HOSPITAL_BASED_OUTPATIENT_CLINIC_OR_DEPARTMENT_OTHER): Payer: Medicare Other | Admitting: Hematology and Oncology

## 2016-07-26 VITALS — BP 131/88 | HR 90 | Temp 97.6°F | Resp 18 | Ht 65.0 in | Wt 174.6 lb

## 2016-07-26 DIAGNOSIS — C7800 Secondary malignant neoplasm of unspecified lung: Secondary | ICD-10-CM | POA: Diagnosis not present

## 2016-07-26 DIAGNOSIS — Z170 Estrogen receptor positive status [ER+]: Secondary | ICD-10-CM | POA: Diagnosis not present

## 2016-07-26 DIAGNOSIS — C787 Secondary malignant neoplasm of liver and intrahepatic bile duct: Secondary | ICD-10-CM

## 2016-07-26 DIAGNOSIS — K769 Liver disease, unspecified: Secondary | ICD-10-CM | POA: Insufficient documentation

## 2016-07-26 DIAGNOSIS — C7951 Secondary malignant neoplasm of bone: Secondary | ICD-10-CM

## 2016-07-26 DIAGNOSIS — D701 Agranulocytosis secondary to cancer chemotherapy: Secondary | ICD-10-CM

## 2016-07-26 DIAGNOSIS — Z5111 Encounter for antineoplastic chemotherapy: Secondary | ICD-10-CM

## 2016-07-26 DIAGNOSIS — C50511 Malignant neoplasm of lower-outer quadrant of right female breast: Secondary | ICD-10-CM | POA: Diagnosis not present

## 2016-07-26 DIAGNOSIS — R918 Other nonspecific abnormal finding of lung field: Secondary | ICD-10-CM | POA: Insufficient documentation

## 2016-07-26 DIAGNOSIS — M899 Disorder of bone, unspecified: Secondary | ICD-10-CM | POA: Diagnosis not present

## 2016-07-26 DIAGNOSIS — C50919 Malignant neoplasm of unspecified site of unspecified female breast: Secondary | ICD-10-CM | POA: Insufficient documentation

## 2016-07-26 DIAGNOSIS — R0602 Shortness of breath: Secondary | ICD-10-CM

## 2016-07-26 LAB — COMPREHENSIVE METABOLIC PANEL
ALT: 44 U/L (ref 0–55)
ANION GAP: 11 meq/L (ref 3–11)
AST: 51 U/L — AB (ref 5–34)
Albumin: 3.9 g/dL (ref 3.5–5.0)
Alkaline Phosphatase: 70 U/L (ref 40–150)
BUN: 10.1 mg/dL (ref 7.0–26.0)
CALCIUM: 9.3 mg/dL (ref 8.4–10.4)
CHLORIDE: 102 meq/L (ref 98–109)
CO2: 24 mEq/L (ref 22–29)
Creatinine: 0.8 mg/dL (ref 0.6–1.1)
EGFR: 87 mL/min/{1.73_m2} — ABNORMAL LOW (ref >90)
Glucose: 111 mg/dl (ref 70–140)
POTASSIUM: 4 meq/L (ref 3.5–5.1)
Sodium: 137 mEq/L (ref 136–145)
Total Bilirubin: 0.91 mg/dL (ref 0.20–1.20)
Total Protein: 7.7 g/dL (ref 6.4–8.3)

## 2016-07-26 LAB — CBC WITH DIFFERENTIAL/PLATELET
BASO%: 2.5 % — ABNORMAL HIGH (ref 0.0–2.0)
BASOS ABS: 0.1 10*3/uL (ref 0.0–0.1)
EOS%: 0.6 % (ref 0.0–7.0)
Eosinophils Absolute: 0 10*3/uL (ref 0.0–0.5)
HEMATOCRIT: 37.7 % (ref 34.8–46.6)
HGB: 13.4 g/dL (ref 11.6–15.9)
LYMPH#: 1.5 10*3/uL (ref 0.9–3.3)
LYMPH%: 42.3 % (ref 14.0–49.7)
MCH: 39.2 pg — AB (ref 25.1–34.0)
MCHC: 35.5 g/dL (ref 31.5–36.0)
MCV: 110.2 fL — ABNORMAL HIGH (ref 79.5–101.0)
MONO#: 1.1 10*3/uL — ABNORMAL HIGH (ref 0.1–0.9)
MONO%: 29 % — ABNORMAL HIGH (ref 0.0–14.0)
NEUT#: 0.9 10*3/uL — ABNORMAL LOW (ref 1.5–6.5)
NEUT%: 25.6 % — AB (ref 38.4–76.8)
PLATELETS: 175 10*3/uL (ref 145–400)
RBC: 3.42 10*6/uL — ABNORMAL LOW (ref 3.70–5.45)
RDW: 18.6 % — ABNORMAL HIGH (ref 11.2–14.5)
WBC: 3.6 10*3/uL — ABNORMAL LOW (ref 3.9–10.3)

## 2016-07-26 MED ORDER — DENOSUMAB 120 MG/1.7ML ~~LOC~~ SOLN
120.0000 mg | Freq: Once | SUBCUTANEOUS | Status: AC
  Administered 2016-07-26: 120 mg via SUBCUTANEOUS
  Filled 2016-07-26: qty 1.7

## 2016-07-26 MED ORDER — SODIUM CHLORIDE 0.9 % IJ SOLN
INTRAMUSCULAR | Status: AC
  Filled 2016-07-26: qty 50

## 2016-07-26 MED ORDER — IOPAMIDOL (ISOVUE-300) INJECTION 61%
INTRAVENOUS | Status: AC
  Filled 2016-07-26: qty 100

## 2016-07-26 MED ORDER — IOPAMIDOL (ISOVUE-300) INJECTION 61%
INTRAVENOUS | Status: AC
  Filled 2016-07-26: qty 30

## 2016-07-26 MED ORDER — IOPAMIDOL (ISOVUE-300) INJECTION 61%
100.0000 mL | Freq: Once | INTRAVENOUS | Status: AC | PRN
  Administered 2016-07-26: 100 mL via INTRAVENOUS

## 2016-07-26 MED ORDER — OXYCODONE HCL 5 MG PO TABS: 5.0000 mg | ORAL_TABLET | Freq: Four times a day (QID) | ORAL | 0 refills | Status: DC | PRN

## 2016-07-26 MED ORDER — IOPAMIDOL (ISOVUE-300) INJECTION 61%
30.0000 mL | Freq: Once | INTRAVENOUS | Status: AC | PRN
  Administered 2016-07-26: 30 mL via ORAL

## 2016-07-26 MED ORDER — FULVESTRANT 250 MG/5ML IM SOLN
500.0000 mg | INTRAMUSCULAR | Status: DC
  Administered 2016-07-26: 500 mg via INTRAMUSCULAR
  Filled 2016-07-26: qty 10

## 2016-07-26 NOTE — Patient Instructions (Signed)
Fulvestrant injection What is this medicine? FULVESTRANT (ful VES trant) blocks the effects of estrogen. It is used to treat breast cancer. This medicine may be used for other purposes; ask your health care provider or pharmacist if you have questions. What should I tell my health care provider before I take this medicine? They need to know if you have any of these conditions: -bleeding problems -liver disease -low levels of platelets in the blood -an unusual or allergic reaction to fulvestrant, other medicines, foods, dyes, or preservatives -pregnant or trying to get pregnant -breast-feeding How should I use this medicine? This medicine is for injection into a muscle. It is usually given by a health care professional in a hospital or clinic setting. Talk to your pediatrician regarding the use of this medicine in children. Special care may be needed. Overdosage: If you think you have taken too much of this medicine contact a poison control center or emergency room at once. NOTE: This medicine is only for you. Do not share this medicine with others. What if I miss a dose? It is important not to miss your dose. Call your doctor or health care professional if you are unable to keep an appointment. What may interact with this medicine? -medicines that treat or prevent blood clots like warfarin, enoxaparin, and dalteparin This list may not describe all possible interactions. Give your health care provider a list of all the medicines, herbs, non-prescription drugs, or dietary supplements you use. Also tell them if you smoke, drink alcohol, or use illegal drugs. Some items may interact with your medicine. What should I watch for while using this medicine? Your condition will be monitored carefully while you are receiving this medicine. You will need important blood work done while you are taking this medicine. Do not become pregnant while taking this medicine or for at least 1 year after stopping  it. Women of child-bearing potential will need to have a negative pregnancy test before starting this medicine. Women should inform their doctor if they wish to become pregnant or think they might be pregnant. There is a potential for serious side effects to an unborn child. Men should inform their doctors if they wish to father a child. This medicine may lower sperm counts. Talk to your health care professional or pharmacist for more information. Do not breast-feed an infant while taking this medicine or for 1 year after the last dose. What side effects may I notice from receiving this medicine? Side effects that you should report to your doctor or health care professional as soon as possible: -allergic reactions like skin rash, itching or hives, swelling of the face, lips, or tongue -feeling faint or lightheaded, falls -pain, tingling, numbness, or weakness in the legs -signs and symptoms of infection like fever or chills; cough; flu-like symptoms; sore throat -vaginal bleeding Side effects that usually do not require medical attention (report to your doctor or health care professional if they continue or are bothersome): -aches, pains -constipation -diarrhea -headache -hot flashes -nausea, vomiting -pain at site where injected -stomach pain This list may not describe all possible side effects. Call your doctor for medical advice about side effects. You may report side effects to FDA at 1-800-FDA-1088. Where should I keep my medicine? This drug is given in a hospital or clinic and will not be stored at home. NOTE: This sheet is a summary. It may not cover all possible information. If you have questions about this medicine, talk to your doctor, pharmacist, or health   care provider.    2016, Elsevier/Gold Standard. (2015-02-25 11:03:55)  Denosumab injection What is this medicine? DENOSUMAB (den oh sue mab) slows bone breakdown. Prolia is used to treat osteoporosis in women after menopause  and in men. Xgeva is used to prevent bone fractures and other bone problems caused by cancer bone metastases. Xgeva is also used to treat giant cell tumor of the bone. This medicine may be used for other purposes; ask your health care provider or pharmacist if you have questions. What should I tell my health care provider before I take this medicine? They need to know if you have any of these conditions: -dental disease -eczema -infection or history of infections -kidney disease or on dialysis -low blood calcium or vitamin D -malabsorption syndrome -scheduled to have surgery or tooth extraction -taking medicine that contains denosumab -thyroid or parathyroid disease -an unusual reaction to denosumab, other medicines, foods, dyes, or preservatives -pregnant or trying to get pregnant -breast-feeding How should I use this medicine? This medicine is for injection under the skin. It is given by a health care professional in a hospital or clinic setting. If you are getting Prolia, a special MedGuide will be given to you by the pharmacist with each prescription and refill. Be sure to read this information carefully each time. For Prolia, talk to your pediatrician regarding the use of this medicine in children. Special care may be needed. For Xgeva, talk to your pediatrician regarding the use of this medicine in children. While this drug may be prescribed for children as young as 13 years for selected conditions, precautions do apply. Overdosage: If you think you have taken too much of this medicine contact a poison control center or emergency room at once. NOTE: This medicine is only for you. Do not share this medicine with others. What if I miss a dose? It is important not to miss your dose. Call your doctor or health care professional if you are unable to keep an appointment. What may interact with this medicine? Do not take this medicine with any of the following medications: -other medicines  containing denosumab This medicine may also interact with the following medications: -medicines that suppress the immune system -medicines that treat cancer -steroid medicines like prednisone or cortisone This list may not describe all possible interactions. Give your health care provider a list of all the medicines, herbs, non-prescription drugs, or dietary supplements you use. Also tell them if you smoke, drink alcohol, or use illegal drugs. Some items may interact with your medicine. What should I watch for while using this medicine? Visit your doctor or health care professional for regular checks on your progress. Your doctor or health care professional may order blood tests and other tests to see how you are doing. Call your doctor or health care professional if you get a cold or other infection while receiving this medicine. Do not treat yourself. This medicine may decrease your body's ability to fight infection. You should make sure you get enough calcium and vitamin D while you are taking this medicine, unless your doctor tells you not to. Discuss the foods you eat and the vitamins you take with your health care professional. See your dentist regularly. Brush and floss your teeth as directed. Before you have any dental work done, tell your dentist you are receiving this medicine. Do not become pregnant while taking this medicine or for 5 months after stopping it. Women should inform their doctor if they wish to become pregnant or think   they might be pregnant. There is a potential for serious side effects to an unborn child. Talk to your health care professional or pharmacist for more information. What side effects may I notice from receiving this medicine? Side effects that you should report to your doctor or health care professional as soon as possible: -allergic reactions like skin rash, itching or hives, swelling of the face, lips, or tongue -breathing problems -chest pain -fast,  irregular heartbeat -feeling faint or lightheaded, falls -fever, chills, or any other sign of infection -muscle spasms, tightening, or twitches -numbness or tingling -skin blisters or bumps, or is dry, peels, or red -slow healing or unexplained pain in the mouth or jaw -unusual bleeding or bruising Side effects that usually do not require medical attention (Report these to your doctor or health care professional if they continue or are bothersome.): -muscle pain -stomach upset, gas This list may not describe all possible side effects. Call your doctor for medical advice about side effects. You may report side effects to FDA at 1-800-FDA-1088. Where should I keep my medicine? This medicine is only given in a clinic, doctor's office, or other health care setting and will not be stored at home. NOTE: This sheet is a summary. It may not cover all possible information. If you have questions about this medicine, talk to your doctor, pharmacist, or health care provider.    2016, Elsevier/Gold Standard. (2012-01-28 12:37:47)   
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
## 2016-07-27 ENCOUNTER — Other Ambulatory Visit: Payer: Self-pay | Admitting: Hematology and Oncology

## 2016-07-27 DIAGNOSIS — C50511 Malignant neoplasm of lower-outer quadrant of right female breast: Secondary | ICD-10-CM

## 2016-08-18 ENCOUNTER — Encounter: Payer: Self-pay | Admitting: *Deleted

## 2016-08-20 ENCOUNTER — Ambulatory Visit: Payer: Self-pay

## 2016-08-20 ENCOUNTER — Ambulatory Visit: Payer: Self-pay | Admitting: Hematology and Oncology

## 2016-08-20 ENCOUNTER — Other Ambulatory Visit: Payer: Self-pay

## 2016-08-22 ENCOUNTER — Other Ambulatory Visit: Payer: Self-pay | Admitting: Emergency Medicine

## 2016-08-22 DIAGNOSIS — C50511 Malignant neoplasm of lower-outer quadrant of right female breast: Secondary | ICD-10-CM

## 2016-08-22 DIAGNOSIS — Z170 Estrogen receptor positive status [ER+]: Principal | ICD-10-CM
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
## 2016-08-23 ENCOUNTER — Other Ambulatory Visit: Payer: Self-pay | Admitting: Emergency Medicine

## 2016-08-23 ENCOUNTER — Encounter: Payer: Self-pay | Admitting: Hematology and Oncology

## 2016-08-23 ENCOUNTER — Other Ambulatory Visit: Payer: Self-pay | Admitting: Hematology and Oncology

## 2016-08-23 ENCOUNTER — Ambulatory Visit (HOSPITAL_BASED_OUTPATIENT_CLINIC_OR_DEPARTMENT_OTHER): Payer: Medicare Other

## 2016-08-23 ENCOUNTER — Other Ambulatory Visit (HOSPITAL_BASED_OUTPATIENT_CLINIC_OR_DEPARTMENT_OTHER): Payer: Medicare Other

## 2016-08-23 ENCOUNTER — Telehealth: Payer: Self-pay | Admitting: Cardiovascular Disease

## 2016-08-23 ENCOUNTER — Ambulatory Visit (HOSPITAL_BASED_OUTPATIENT_CLINIC_OR_DEPARTMENT_OTHER): Payer: Medicare Other | Admitting: Hematology and Oncology

## 2016-08-23 DIAGNOSIS — Z5111 Encounter for antineoplastic chemotherapy: Secondary | ICD-10-CM | POA: Diagnosis not present

## 2016-08-23 DIAGNOSIS — Z170 Estrogen receptor positive status [ER+]: Secondary | ICD-10-CM

## 2016-08-23 DIAGNOSIS — C773 Secondary and unspecified malignant neoplasm of axilla and upper limb lymph nodes: Secondary | ICD-10-CM

## 2016-08-23 DIAGNOSIS — D701 Agranulocytosis secondary to cancer chemotherapy: Secondary | ICD-10-CM

## 2016-08-23 DIAGNOSIS — C50511 Malignant neoplasm of lower-outer quadrant of right female breast: Secondary | ICD-10-CM

## 2016-08-23 DIAGNOSIS — C7951 Secondary malignant neoplasm of bone: Secondary | ICD-10-CM | POA: Diagnosis not present

## 2016-08-23 DIAGNOSIS — R918 Other nonspecific abnormal finding of lung field: Secondary | ICD-10-CM

## 2016-08-23 DIAGNOSIS — C787 Secondary malignant neoplasm of liver and intrahepatic bile duct: Secondary | ICD-10-CM

## 2016-08-23 DIAGNOSIS — R05 Cough: Secondary | ICD-10-CM

## 2016-08-23 DIAGNOSIS — R062 Wheezing: Secondary | ICD-10-CM

## 2016-08-23 DIAGNOSIS — R0602 Shortness of breath: Secondary | ICD-10-CM

## 2016-08-23 LAB — COMPREHENSIVE METABOLIC PANEL
ALBUMIN: 3.8 g/dL (ref 3.5–5.0)
ALK PHOS: 69 U/L (ref 40–150)
ALT: 47 U/L (ref 0–55)
ANION GAP: 8 meq/L (ref 3–11)
AST: 37 U/L — ABNORMAL HIGH (ref 5–34)
BUN: 9.5 mg/dL (ref 7.0–26.0)
CALCIUM: 9.5 mg/dL (ref 8.4–10.4)
CO2: 29 mEq/L (ref 22–29)
Chloride: 99 mEq/L (ref 98–109)
Creatinine: 0.8 mg/dL (ref 0.6–1.1)
EGFR: 81 mL/min/{1.73_m2} — AB (ref >90)
Glucose: 140 mg/dl (ref 70–140)
POTASSIUM: 3.7 meq/L (ref 3.5–5.1)
Sodium: 137 mEq/L (ref 136–145)
TOTAL PROTEIN: 7.3 g/dL (ref 6.4–8.3)
Total Bilirubin: 1.1 mg/dL (ref 0.20–1.20)

## 2016-08-23 LAB — CBC WITH DIFFERENTIAL/PLATELET
BASO%: 1.5 % (ref 0.0–2.0)
BASOS ABS: 0 10*3/uL (ref 0.0–0.1)
EOS ABS: 0 10*3/uL (ref 0.0–0.5)
EOS%: 1.4 % (ref 0.0–7.0)
HCT: 41.4 % (ref 34.8–46.6)
HEMOGLOBIN: 14.3 g/dL (ref 11.6–15.9)
LYMPH%: 37.8 % (ref 14.0–49.7)
MCH: 41.1 pg — AB (ref 25.1–34.0)
MCHC: 34.6 g/dL (ref 31.5–36.0)
MCV: 119.1 fL — AB (ref 79.5–101.0)
MONO#: 0.2 10*3/uL (ref 0.1–0.9)
MONO%: 7.8 % (ref 0.0–14.0)
NEUT%: 51.5 % (ref 38.4–76.8)
NEUTROS ABS: 1.3 10*3/uL — AB (ref 1.5–6.5)
PLATELETS: 156 10*3/uL (ref 145–400)
RBC: 3.48 10*6/uL — ABNORMAL LOW (ref 3.70–5.45)
RDW: 14.9 % — AB (ref 11.2–14.5)
WBC: 2.5 10*3/uL — ABNORMAL LOW (ref 3.9–10.3)
lymph#: 1 10*3/uL (ref 0.9–3.3)

## 2016-08-23 MED ORDER — ALBUTEROL SULFATE (2.5 MG/3ML) 0.083% IN NEBU: 2.5000 mg | INHALATION_SOLUTION | Freq: Four times a day (QID) | RESPIRATORY_TRACT | 12 refills | Status: DC | PRN

## 2016-08-23 MED ORDER — DENOSUMAB 120 MG/1.7ML ~~LOC~~ SOLN
120.0000 mg | Freq: Once | SUBCUTANEOUS | Status: AC
  Administered 2016-08-23: 120 mg via SUBCUTANEOUS
  Filled 2016-08-23: qty 1.7

## 2016-08-23 MED ORDER — PALBOCICLIB 125 MG PO CAPS: ORAL_CAPSULE | ORAL | 0 refills | Status: DC

## 2016-08-23 MED ORDER — PALBOCICLIB 125 MG PO CAPS: ORAL_CAPSULE | ORAL | 6 refills | Status: DC

## 2016-08-23 MED ORDER — FULVESTRANT 250 MG/5ML IM SOLN
500.0000 mg | INTRAMUSCULAR | Status: DC
  Administered 2016-08-23: 500 mg via INTRAMUSCULAR
  Filled 2016-08-23: qty 10

## 2016-08-23 MED FILL — IBRANCE 125 MG CAPSULE: 125 | 21 days supply | Qty: 21 | Fill #0
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
## 2016-08-23 NOTE — Patient Instructions (Signed)
Fulvestrant injection What is this medicine? FULVESTRANT (ful VES trant) blocks the effects of estrogen. It is used to treat breast cancer. This medicine may be used for other purposes; ask your health care provider or pharmacist if you have questions. What should I tell my health care provider before I take this medicine? They need to know if you have any of these conditions: -bleeding problems -liver disease -low levels of platelets in the blood -an unusual or allergic reaction to fulvestrant, other medicines, foods, dyes, or preservatives -pregnant or trying to get pregnant -breast-feeding How should I use this medicine? This medicine is for injection into a muscle. It is usually given by a health care professional in a hospital or clinic setting. Talk to your pediatrician regarding the use of this medicine in children. Special care may be needed. Overdosage: If you think you have taken too much of this medicine contact a poison control center or emergency room at once. NOTE: This medicine is only for you. Do not share this medicine with others. What if I miss a dose? It is important not to miss your dose. Call your doctor or health care professional if you are unable to keep an appointment. What may interact with this medicine? -medicines that treat or prevent blood clots like warfarin, enoxaparin, and dalteparin This list may not describe all possible interactions. Give your health care provider a list of all the medicines, herbs, non-prescription drugs, or dietary supplements you use. Also tell them if you smoke, drink alcohol, or use illegal drugs. Some items may interact with your medicine. What should I watch for while using this medicine? Your condition will be monitored carefully while you are receiving this medicine. You will need important blood work done while you are taking this medicine. Do not become pregnant while taking this medicine or for at least 1 year after stopping  it. Women of child-bearing potential will need to have a negative pregnancy test before starting this medicine. Women should inform their doctor if they wish to become pregnant or think they might be pregnant. There is a potential for serious side effects to an unborn child. Men should inform their doctors if they wish to father a child. This medicine may lower sperm counts. Talk to your health care professional or pharmacist for more information. Do not breast-feed an infant while taking this medicine or for 1 year after the last dose. What side effects may I notice from receiving this medicine? Side effects that you should report to your doctor or health care professional as soon as possible: -allergic reactions like skin rash, itching or hives, swelling of the face, lips, or tongue -feeling faint or lightheaded, falls -pain, tingling, numbness, or weakness in the legs -signs and symptoms of infection like fever or chills; cough; flu-like symptoms; sore throat -vaginal bleeding Side effects that usually do not require medical attention (report to your doctor or health care professional if they continue or are bothersome): -aches, pains -constipation -diarrhea -headache -hot flashes -nausea, vomiting -pain at site where injected -stomach pain This list may not describe all possible side effects. Call your doctor for medical advice about side effects. You may report side effects to FDA at 1-800-FDA-1088. Where should I keep my medicine? This drug is given in a hospital or clinic and will not be stored at home. NOTE: This sheet is a summary. It may not cover all possible information. If you have questions about this medicine, talk to your doctor, pharmacist, or health   care provider.    2016, Elsevier/Gold Standard. (2015-02-25 11:03:55)  Denosumab injection What is this medicine? DENOSUMAB (den oh sue mab) slows bone breakdown. Prolia is used to treat osteoporosis in women after menopause  and in men. Xgeva is used to prevent bone fractures and other bone problems caused by cancer bone metastases. Xgeva is also used to treat giant cell tumor of the bone. This medicine may be used for other purposes; ask your health care provider or pharmacist if you have questions. What should I tell my health care provider before I take this medicine? They need to know if you have any of these conditions: -dental disease -eczema -infection or history of infections -kidney disease or on dialysis -low blood calcium or vitamin D -malabsorption syndrome -scheduled to have surgery or tooth extraction -taking medicine that contains denosumab -thyroid or parathyroid disease -an unusual reaction to denosumab, other medicines, foods, dyes, or preservatives -pregnant or trying to get pregnant -breast-feeding How should I use this medicine? This medicine is for injection under the skin. It is given by a health care professional in a hospital or clinic setting. If you are getting Prolia, a special MedGuide will be given to you by the pharmacist with each prescription and refill. Be sure to read this information carefully each time. For Prolia, talk to your pediatrician regarding the use of this medicine in children. Special care may be needed. For Xgeva, talk to your pediatrician regarding the use of this medicine in children. While this drug may be prescribed for children as young as 13 years for selected conditions, precautions do apply. Overdosage: If you think you have taken too much of this medicine contact a poison control center or emergency room at once. NOTE: This medicine is only for you. Do not share this medicine with others. What if I miss a dose? It is important not to miss your dose. Call your doctor or health care professional if you are unable to keep an appointment. What may interact with this medicine? Do not take this medicine with any of the following medications: -other medicines  containing denosumab This medicine may also interact with the following medications: -medicines that suppress the immune system -medicines that treat cancer -steroid medicines like prednisone or cortisone This list may not describe all possible interactions. Give your health care provider a list of all the medicines, herbs, non-prescription drugs, or dietary supplements you use. Also tell them if you smoke, drink alcohol, or use illegal drugs. Some items may interact with your medicine. What should I watch for while using this medicine? Visit your doctor or health care professional for regular checks on your progress. Your doctor or health care professional may order blood tests and other tests to see how you are doing. Call your doctor or health care professional if you get a cold or other infection while receiving this medicine. Do not treat yourself. This medicine may decrease your body's ability to fight infection. You should make sure you get enough calcium and vitamin D while you are taking this medicine, unless your doctor tells you not to. Discuss the foods you eat and the vitamins you take with your health care professional. See your dentist regularly. Brush and floss your teeth as directed. Before you have any dental work done, tell your dentist you are receiving this medicine. Do not become pregnant while taking this medicine or for 5 months after stopping it. Women should inform their doctor if they wish to become pregnant or think   they might be pregnant. There is a potential for serious side effects to an unborn child. Talk to your health care professional or pharmacist for more information. What side effects may I notice from receiving this medicine? Side effects that you should report to your doctor or health care professional as soon as possible: -allergic reactions like skin rash, itching or hives, swelling of the face, lips, or tongue -breathing problems -chest pain -fast,  irregular heartbeat -feeling faint or lightheaded, falls -fever, chills, or any other sign of infection -muscle spasms, tightening, or twitches -numbness or tingling -skin blisters or bumps, or is dry, peels, or red -slow healing or unexplained pain in the mouth or jaw -unusual bleeding or bruising Side effects that usually do not require medical attention (Report these to your doctor or health care professional if they continue or are bothersome.): -muscle pain -stomach upset, gas This list may not describe all possible side effects. Call your doctor for medical advice about side effects. You may report side effects to FDA at 1-800-FDA-1088. Where should I keep my medicine? This medicine is only given in a clinic, doctor's office, or other health care setting and will not be stored at home. NOTE: This sheet is a summary. It may not cover all possible information. If you have questions about this medicine, talk to your doctor, pharmacist, or health care provider.    2016, Elsevier/Gold Standard. (2012-01-28 12:37:47)   

## 2016-08-23 NOTE — Telephone Encounter (Signed)
3 attempts to schedule fu from recall.  Deleting recall.  °

## 2016-08-27 ENCOUNTER — Telehealth: Payer: Self-pay | Admitting: Pharmacist

## 2016-08-27 NOTE — Telephone Encounter (Signed)
Oral Chemotherapy Pharmacist Encounter  Received notification from [facility]  that patient has depleted available copayment assistance from [employer] for her Ibrance. She still has an outstanding balance of $500 for her current fill.  Patient has been successfully enrolled for foundation copayment grant funds from the [employer] [facility]) for breast cancer.  Award amount: $[id number] Effective dates: 05/29/16-08/26/17  Billing information: ID: [id number] BIN: [id number] Group: [id number] PCN: [id number]  I will forward billing information to [facility] to fill patient's prescription. I called patient to alert her of good news. She expressed understanding and appreciation.  Johny Drilling, PharmD, BCPS, BCOP 08/27/2016  10:56 AM Oral Oncology Clinic [phone]

## 2016-08-29 ENCOUNTER — Encounter: Payer: Self-pay | Admitting: Physician Assistant

## 2016-09-17 ENCOUNTER — Other Ambulatory Visit: Payer: Self-pay

## 2016-09-17 ENCOUNTER — Ambulatory Visit: Payer: Self-pay

## 2016-09-17 ENCOUNTER — Ambulatory Visit: Payer: Self-pay | Admitting: Hematology and Oncology

## 2016-09-20 ENCOUNTER — Other Ambulatory Visit: Payer: Self-pay | Admitting: Emergency Medicine

## 2016-09-20 ENCOUNTER — Encounter: Payer: Self-pay | Admitting: Hematology and Oncology

## 2016-09-20 ENCOUNTER — Ambulatory Visit (HOSPITAL_BASED_OUTPATIENT_CLINIC_OR_DEPARTMENT_OTHER): Payer: Medicare Other

## 2016-09-20 ENCOUNTER — Other Ambulatory Visit (HOSPITAL_BASED_OUTPATIENT_CLINIC_OR_DEPARTMENT_OTHER): Payer: Medicare Other

## 2016-09-20 ENCOUNTER — Ambulatory Visit (HOSPITAL_BASED_OUTPATIENT_CLINIC_OR_DEPARTMENT_OTHER): Payer: Medicare Other | Admitting: Hematology and Oncology

## 2016-09-20 VITALS — BP 118/73 | HR 73 | Temp 97.8°F | Resp 18 | Wt 175.9 lb

## 2016-09-20 DIAGNOSIS — C50511 Malignant neoplasm of lower-outer quadrant of right female breast: Secondary | ICD-10-CM

## 2016-09-20 DIAGNOSIS — C773 Secondary and unspecified malignant neoplasm of axilla and upper limb lymph nodes: Secondary | ICD-10-CM

## 2016-09-20 DIAGNOSIS — R0602 Shortness of breath: Secondary | ICD-10-CM

## 2016-09-20 DIAGNOSIS — C7800 Secondary malignant neoplasm of unspecified lung: Secondary | ICD-10-CM

## 2016-09-20 DIAGNOSIS — R945 Abnormal results of liver function studies: Secondary | ICD-10-CM

## 2016-09-20 DIAGNOSIS — Z170 Estrogen receptor positive status [ER+]: Principal | ICD-10-CM

## 2016-09-20 DIAGNOSIS — Z5111 Encounter for antineoplastic chemotherapy: Secondary | ICD-10-CM

## 2016-09-20 DIAGNOSIS — C787 Secondary malignant neoplasm of liver and intrahepatic bile duct: Secondary | ICD-10-CM | POA: Diagnosis not present

## 2016-09-20 DIAGNOSIS — R062 Wheezing: Secondary | ICD-10-CM

## 2016-09-20 DIAGNOSIS — C7951 Secondary malignant neoplasm of bone: Secondary | ICD-10-CM

## 2016-09-20 DIAGNOSIS — D701 Agranulocytosis secondary to cancer chemotherapy: Secondary | ICD-10-CM

## 2016-09-20 LAB — COMPREHENSIVE METABOLIC PANEL
ALT: 87 U/L — ABNORMAL HIGH (ref 0–55)
ANION GAP: 7 meq/L (ref 3–11)
AST: 94 U/L — ABNORMAL HIGH (ref 5–34)
Albumin: 3.9 g/dL (ref 3.5–5.0)
Alkaline Phosphatase: 65 U/L (ref 40–150)
BILIRUBIN TOTAL: 0.78 mg/dL (ref 0.20–1.20)
BUN: 9.9 mg/dL (ref 7.0–26.0)
CALCIUM: 9.7 mg/dL (ref 8.4–10.4)
CO2: 29 mEq/L (ref 22–29)
CREATININE: 0.8 mg/dL (ref 0.6–1.1)
Chloride: 103 mEq/L (ref 98–109)
EGFR: 83 mL/min/{1.73_m2} — ABNORMAL LOW (ref >90)
Glucose: 128 mg/dl (ref 70–140)
Potassium: 4.3 mEq/L (ref 3.5–5.1)
Sodium: 139 mEq/L (ref 136–145)
TOTAL PROTEIN: 7.5 g/dL (ref 6.4–8.3)

## 2016-09-20 LAB — CBC WITH DIFFERENTIAL/PLATELET
BASO%: 1.3 % (ref 0.0–2.0)
Basophils Absolute: 0 10*3/uL (ref 0.0–0.1)
EOS%: 0.9 % (ref 0.0–7.0)
Eosinophils Absolute: 0 10*3/uL (ref 0.0–0.5)
HEMATOCRIT: 41 % (ref 34.8–46.6)
HEMOGLOBIN: 14.2 g/dL (ref 11.6–15.9)
LYMPH#: 1.1 10*3/uL (ref 0.9–3.3)
LYMPH%: 42.9 % (ref 14.0–49.7)
MCH: 41.1 pg — ABNORMAL HIGH (ref 25.1–34.0)
MCHC: 34.6 g/dL (ref 31.5–36.0)
MCV: 118.7 fL — ABNORMAL HIGH (ref 79.5–101.0)
MONO#: 0.2 10*3/uL (ref 0.1–0.9)
MONO%: 8.9 % (ref 0.0–14.0)
NEUT%: 46 % (ref 38.4–76.8)
NEUTROS ABS: 1.2 10*3/uL — AB (ref 1.5–6.5)
PLATELETS: 125 10*3/uL — AB (ref 145–400)
RBC: 3.45 10*6/uL — ABNORMAL LOW (ref 3.70–5.45)
RDW: 14.6 % — ABNORMAL HIGH (ref 11.2–14.5)
WBC: 2.6 10*3/uL — AB (ref 3.9–10.3)

## 2016-09-20 MED ORDER — FULVESTRANT 250 MG/5ML IM SOLN
500.0000 mg | INTRAMUSCULAR | Status: DC
  Administered 2016-09-20: 500 mg via INTRAMUSCULAR
  Filled 2016-09-20: qty 10

## 2016-09-20 MED ORDER — PALBOCICLIB 125 MG PO CAPS: ORAL_CAPSULE | ORAL | 0 refills | Status: DC

## 2016-09-20 MED ORDER — PROMETHAZINE HCL 25 MG PO TABS: 25.0000 mg | ORAL_TABLET | Freq: Four times a day (QID) | ORAL | 3 refills | Status: DC | PRN

## 2016-09-20 MED ORDER — OXYCODONE HCL 5 MG PO TABS: 5.0000 mg | ORAL_TABLET | Freq: Four times a day (QID) | ORAL | 0 refills | Status: DC | PRN

## 2016-09-20 MED ORDER — DENOSUMAB 120 MG/1.7ML ~~LOC~~ SOLN
120.0000 mg | Freq: Once | SUBCUTANEOUS | Status: AC
  Administered 2016-09-20: 120 mg via SUBCUTANEOUS
  Filled 2016-09-20: qty 1.7

## 2016-09-20 MED FILL — IBRANCE 125 MG CAPSULE: 125 | 28 days supply | Qty: 21 | Fill #0

## 2016-09-20 MED FILL — PROMETHAZINE 25 MG TABLET: 25 | 7 days supply | Qty: 30 | Fill #0
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
## 2016-09-20 NOTE — Patient Instructions (Signed)
Fulvestrant injection What is this medicine? FULVESTRANT (ful VES trant) blocks the effects of estrogen. It is used to treat breast cancer. This medicine may be used for other purposes; ask your health care provider or pharmacist if you have questions. What should I tell my health care provider before I take this medicine? They need to know if you have any of these conditions: -bleeding problems -liver disease -low levels of platelets in the blood -an unusual or allergic reaction to fulvestrant, other medicines, foods, dyes, or preservatives -pregnant or trying to get pregnant -breast-feeding How should I use this medicine? This medicine is for injection into a muscle. It is usually given by a health care professional in a hospital or clinic setting. Talk to your pediatrician regarding the use of this medicine in children. Special care may be needed. Overdosage: If you think you have taken too much of this medicine contact a poison control center or emergency room at once. NOTE: This medicine is only for you. Do not share this medicine with others. What if I miss a dose? It is important not to miss your dose. Call your doctor or health care professional if you are unable to keep an appointment. What may interact with this medicine? -medicines that treat or prevent blood clots like warfarin, enoxaparin, and dalteparin This list may not describe all possible interactions. Give your health care provider a list of all the medicines, herbs, non-prescription drugs, or dietary supplements you use. Also tell them if you smoke, drink alcohol, or use illegal drugs. Some items may interact with your medicine. What should I watch for while using this medicine? Your condition will be monitored carefully while you are receiving this medicine. You will need important blood work done while you are taking this medicine. Do not become pregnant while taking this medicine or for at least 1 year after stopping  it. Women of child-bearing potential will need to have a negative pregnancy test before starting this medicine. Women should inform their doctor if they wish to become pregnant or think they might be pregnant. There is a potential for serious side effects to an unborn child. Men should inform their doctors if they wish to father a child. This medicine may lower sperm counts. Talk to your health care professional or pharmacist for more information. Do not breast-feed an infant while taking this medicine or for 1 year after the last dose. What side effects may I notice from receiving this medicine? Side effects that you should report to your doctor or health care professional as soon as possible: -allergic reactions like skin rash, itching or hives, swelling of the face, lips, or tongue -feeling faint or lightheaded, falls -pain, tingling, numbness, or weakness in the legs -signs and symptoms of infection like fever or chills; cough; flu-like symptoms; sore throat -vaginal bleeding Side effects that usually do not require medical attention (report to your doctor or health care professional if they continue or are bothersome): -aches, pains -constipation -diarrhea -headache -hot flashes -nausea, vomiting -pain at site where injected -stomach pain This list may not describe all possible side effects. Call your doctor for medical advice about side effects. You may report side effects to FDA at 1-800-FDA-1088. Where should I keep my medicine? This drug is given in a hospital or clinic and will not be stored at home. NOTE: This sheet is a summary. It may not cover all possible information. If you have questions about this medicine, talk to your doctor, pharmacist, or health   care provider.    2016, Elsevier/Gold Standard. (2015-02-25 11:03:55)  Denosumab injection What is this medicine? DENOSUMAB (den oh sue mab) slows bone breakdown. Prolia is used to treat osteoporosis in women after menopause  and in men. Xgeva is used to prevent bone fractures and other bone problems caused by cancer bone metastases. Xgeva is also used to treat giant cell tumor of the bone. This medicine may be used for other purposes; ask your health care provider or pharmacist if you have questions. What should I tell my health care provider before I take this medicine? They need to know if you have any of these conditions: -dental disease -eczema -infection or history of infections -kidney disease or on dialysis -low blood calcium or vitamin D -malabsorption syndrome -scheduled to have surgery or tooth extraction -taking medicine that contains denosumab -thyroid or parathyroid disease -an unusual reaction to denosumab, other medicines, foods, dyes, or preservatives -pregnant or trying to get pregnant -breast-feeding How should I use this medicine? This medicine is for injection under the skin. It is given by a health care professional in a hospital or clinic setting. If you are getting Prolia, a special MedGuide will be given to you by the pharmacist with each prescription and refill. Be sure to read this information carefully each time. For Prolia, talk to your pediatrician regarding the use of this medicine in children. Special care may be needed. For Xgeva, talk to your pediatrician regarding the use of this medicine in children. While this drug may be prescribed for children as young as 13 years for selected conditions, precautions do apply. Overdosage: If you think you have taken too much of this medicine contact a poison control center or emergency room at once. NOTE: This medicine is only for you. Do not share this medicine with others. What if I miss a dose? It is important not to miss your dose. Call your doctor or health care professional if you are unable to keep an appointment. What may interact with this medicine? Do not take this medicine with any of the following medications: -other medicines  containing denosumab This medicine may also interact with the following medications: -medicines that suppress the immune system -medicines that treat cancer -steroid medicines like prednisone or cortisone This list may not describe all possible interactions. Give your health care provider a list of all the medicines, herbs, non-prescription drugs, or dietary supplements you use. Also tell them if you smoke, drink alcohol, or use illegal drugs. Some items may interact with your medicine. What should I watch for while using this medicine? Visit your doctor or health care professional for regular checks on your progress. Your doctor or health care professional may order blood tests and other tests to see how you are doing. Call your doctor or health care professional if you get a cold or other infection while receiving this medicine. Do not treat yourself. This medicine may decrease your body's ability to fight infection. You should make sure you get enough calcium and vitamin D while you are taking this medicine, unless your doctor tells you not to. Discuss the foods you eat and the vitamins you take with your health care professional. See your dentist regularly. Brush and floss your teeth as directed. Before you have any dental work done, tell your dentist you are receiving this medicine. Do not become pregnant while taking this medicine or for 5 months after stopping it. Women should inform their doctor if they wish to become pregnant or think   they might be pregnant. There is a potential for serious side effects to an unborn child. Talk to your health care professional or pharmacist for more information. What side effects may I notice from receiving this medicine? Side effects that you should report to your doctor or health care professional as soon as possible: -allergic reactions like skin rash, itching or hives, swelling of the face, lips, or tongue -breathing problems -chest pain -fast,  irregular heartbeat -feeling faint or lightheaded, falls -fever, chills, or any other sign of infection -muscle spasms, tightening, or twitches -numbness or tingling -skin blisters or bumps, or is dry, peels, or red -slow healing or unexplained pain in the mouth or jaw -unusual bleeding or bruising Side effects that usually do not require medical attention (Report these to your doctor or health care professional if they continue or are bothersome.): -muscle pain -stomach upset, gas This list may not describe all possible side effects. Call your doctor for medical advice about side effects. You may report side effects to FDA at 1-800-FDA-1088. Where should I keep my medicine? This medicine is only given in a clinic, doctor's office, or other health care setting and will not be stored at home. NOTE: This sheet is a summary. It may not cover all possible information. If you have questions about this medicine, talk to your doctor, pharmacist, or health care provider.    2016, Elsevier/Gold Standard. (2012-01-28 12:37:47)   

## 2016-09-24 ENCOUNTER — Encounter (HOSPITAL_COMMUNITY): Payer: Self-pay

## 2016-09-24 ENCOUNTER — Other Ambulatory Visit: Payer: Self-pay | Admitting: Hematology and Oncology

## 2016-10-02 ENCOUNTER — Other Ambulatory Visit: Payer: Self-pay | Admitting: *Deleted

## 2016-10-02 ENCOUNTER — Other Ambulatory Visit: Payer: Self-pay | Admitting: Internal Medicine

## 2016-10-02 MED ORDER — OSELTAMIVIR PHOSPHATE 75 MG PO CAPS
75.0000 mg | ORAL_CAPSULE | Freq: Two times a day (BID) | ORAL | 0 refills | Status: AC
Start: 2016-10-02 — End: 2016-10-12

## 2016-10-02 MED ORDER — OSELTAMIVIR PHOSPHATE 75 MG PO CAPS: 75.0000 mg | ORAL_CAPSULE | Freq: Two times a day (BID) | ORAL | 0 refills | Status: DC

## 2016-10-15 ENCOUNTER — Ambulatory Visit: Payer: Self-pay | Admitting: Hematology and Oncology

## 2016-10-15 ENCOUNTER — Ambulatory Visit: Payer: Self-pay

## 2016-10-15 ENCOUNTER — Other Ambulatory Visit: Payer: Self-pay

## 2016-10-17 ENCOUNTER — Ambulatory Visit (HOSPITAL_COMMUNITY)
Admission: RE | Admit: 2016-10-17 | Discharge: 2016-10-17 | Disposition: A | Discharge status: Home / Self Care | Payer: Medicare Other | Source: Ambulatory Visit | Attending: Hematology and Oncology | Admitting: Hematology and Oncology

## 2016-10-17 ENCOUNTER — Other Ambulatory Visit: Payer: Self-pay

## 2016-10-17 DIAGNOSIS — C7951 Secondary malignant neoplasm of bone: Secondary | ICD-10-CM | POA: Diagnosis not present

## 2016-10-17 DIAGNOSIS — C50919 Malignant neoplasm of unspecified site of unspecified female breast: Secondary | ICD-10-CM | POA: Diagnosis not present

## 2016-10-17 DIAGNOSIS — Z170 Estrogen receptor positive status [ER+]: Secondary | ICD-10-CM | POA: Insufficient documentation

## 2016-10-17 DIAGNOSIS — C787 Secondary malignant neoplasm of liver and intrahepatic bile duct: Secondary | ICD-10-CM | POA: Insufficient documentation

## 2016-10-17 DIAGNOSIS — C7800 Secondary malignant neoplasm of unspecified lung: Secondary | ICD-10-CM

## 2016-10-17 DIAGNOSIS — J90 Pleural effusion, not elsewhere classified: Secondary | ICD-10-CM | POA: Diagnosis not present

## 2016-10-17 DIAGNOSIS — Z9889 Other specified postprocedural states: Secondary | ICD-10-CM | POA: Diagnosis not present

## 2016-10-17 DIAGNOSIS — C50911 Malignant neoplasm of unspecified site of right female breast: Secondary | ICD-10-CM | POA: Diagnosis not present

## 2016-10-17 DIAGNOSIS — M899 Disorder of bone, unspecified: Secondary | ICD-10-CM | POA: Diagnosis not present

## 2016-10-17 DIAGNOSIS — C50511 Malignant neoplasm of lower-outer quadrant of right female breast: Secondary | ICD-10-CM | POA: Diagnosis not present

## 2016-10-17 LAB — GLUCOSE, CAPILLARY: Glucose-Capillary: 112 mg/dL — ABNORMAL HIGH (ref 65–99)

## 2016-10-17 MED ORDER — IOPAMIDOL (ISOVUE-300) INJECTION 61%
100.0000 mL | Freq: Once | INTRAVENOUS | Status: AC | PRN
  Administered 2016-10-17: 100 mL via INTRAVENOUS

## 2016-10-17 MED ORDER — FLUDEOXYGLUCOSE F - 18 (FDG) INJECTION
8.6400 | Freq: Once | INTRAVENOUS | Status: AC | PRN
  Administered 2016-10-17: 8.64 via INTRAVENOUS

## 2016-10-17 MED ORDER — IOPAMIDOL (ISOVUE-300) INJECTION 61%
INTRAVENOUS | Status: AC
  Filled 2016-10-17: qty 100

## 2016-10-18 ENCOUNTER — Other Ambulatory Visit (HOSPITAL_BASED_OUTPATIENT_CLINIC_OR_DEPARTMENT_OTHER): Payer: Medicare Other

## 2016-10-18 ENCOUNTER — Ambulatory Visit (HOSPITAL_BASED_OUTPATIENT_CLINIC_OR_DEPARTMENT_OTHER): Payer: Medicare Other

## 2016-10-18 ENCOUNTER — Other Ambulatory Visit: Payer: Self-pay | Admitting: Internal Medicine

## 2016-10-18 ENCOUNTER — Ambulatory Visit (HOSPITAL_BASED_OUTPATIENT_CLINIC_OR_DEPARTMENT_OTHER): Payer: Medicare Other | Admitting: Hematology and Oncology

## 2016-10-18 ENCOUNTER — Encounter: Payer: Self-pay | Admitting: Hematology and Oncology

## 2016-10-18 DIAGNOSIS — C7951 Secondary malignant neoplasm of bone: Secondary | ICD-10-CM

## 2016-10-18 DIAGNOSIS — D701 Agranulocytosis secondary to cancer chemotherapy: Secondary | ICD-10-CM | POA: Diagnosis not present

## 2016-10-18 DIAGNOSIS — Z5111 Encounter for antineoplastic chemotherapy: Secondary | ICD-10-CM | POA: Diagnosis not present

## 2016-10-18 DIAGNOSIS — C773 Secondary and unspecified malignant neoplasm of axilla and upper limb lymph nodes: Secondary | ICD-10-CM | POA: Diagnosis not present

## 2016-10-18 DIAGNOSIS — C50511 Malignant neoplasm of lower-outer quadrant of right female breast: Secondary | ICD-10-CM

## 2016-10-18 DIAGNOSIS — R918 Other nonspecific abnormal finding of lung field: Secondary | ICD-10-CM

## 2016-10-18 DIAGNOSIS — Z170 Estrogen receptor positive status [ER+]: Secondary | ICD-10-CM | POA: Diagnosis not present

## 2016-10-18 DIAGNOSIS — C7800 Secondary malignant neoplasm of unspecified lung: Secondary | ICD-10-CM

## 2016-10-18 DIAGNOSIS — C787 Secondary malignant neoplasm of liver and intrahepatic bile duct: Secondary | ICD-10-CM | POA: Diagnosis not present

## 2016-10-18 LAB — COMPREHENSIVE METABOLIC PANEL
ALT: 82 U/L — AB (ref 0–55)
AST: 102 U/L — ABNORMAL HIGH (ref 5–34)
Albumin: 4 g/dL (ref 3.5–5.0)
Alkaline Phosphatase: 64 U/L (ref 40–150)
Anion Gap: 8 mEq/L (ref 3–11)
BUN: 11.4 mg/dL (ref 7.0–26.0)
CHLORIDE: 100 meq/L (ref 98–109)
CO2: 30 meq/L — AB (ref 22–29)
CREATININE: 0.8 mg/dL (ref 0.6–1.1)
Calcium: 9.9 mg/dL (ref 8.4–10.4)
EGFR: 79 mL/min/{1.73_m2} — ABNORMAL LOW (ref >90)
Glucose: 143 mg/dl — ABNORMAL HIGH (ref 70–140)
POTASSIUM: 3.5 meq/L (ref 3.5–5.1)
Sodium: 138 mEq/L (ref 136–145)
Total Bilirubin: 0.94 mg/dL (ref 0.20–1.20)
Total Protein: 7.5 g/dL (ref 6.4–8.3)

## 2016-10-18 LAB — CBC WITH DIFFERENTIAL/PLATELET
BASO%: 1.9 % (ref 0.0–2.0)
BASOS ABS: 0.1 10*3/uL (ref 0.0–0.1)
EOS%: 0.7 % (ref 0.0–7.0)
Eosinophils Absolute: 0 10*3/uL (ref 0.0–0.5)
HCT: 40.5 % (ref 34.8–46.6)
HGB: 14.5 g/dL (ref 11.6–15.9)
LYMPH%: 46.1 % (ref 14.0–49.7)
MCH: 39.9 pg — AB (ref 25.1–34.0)
MCHC: 35.8 g/dL (ref 31.5–36.0)
MCV: 111.6 fL — ABNORMAL HIGH (ref 79.5–101.0)
MONO#: 0.2 10*3/uL (ref 0.1–0.9)
MONO%: 7.4 % (ref 0.0–14.0)
NEUT#: 1.2 10*3/uL — ABNORMAL LOW (ref 1.5–6.5)
NEUT%: 43.9 % (ref 38.4–76.8)
PLATELETS: 152 10*3/uL (ref 145–400)
RBC: 3.63 10*6/uL — AB (ref 3.70–5.45)
RDW: 13.7 % (ref 11.2–14.5)
WBC: 2.7 10*3/uL — ABNORMAL LOW (ref 3.9–10.3)
lymph#: 1.2 10*3/uL (ref 0.9–3.3)

## 2016-10-18 MED ORDER — FULVESTRANT 250 MG/5ML IM SOLN
500.0000 mg | INTRAMUSCULAR | Status: DC
  Administered 2016-10-18: 500 mg via INTRAMUSCULAR
  Filled 2016-10-18: qty 10

## 2016-10-18 MED ORDER — DENOSUMAB 120 MG/1.7ML ~~LOC~~ SOLN
120.0000 mg | Freq: Once | SUBCUTANEOUS | Status: AC
  Administered 2016-10-18: 120 mg via SUBCUTANEOUS
  Filled 2016-10-18: qty 1.7

## 2016-10-18 MED ORDER — PALBOCICLIB 125 MG PO CAPS: ORAL_CAPSULE | ORAL | 6 refills | Status: DC

## 2016-10-18 MED FILL — IBRANCE 125 MG CAPSULE: 125 | 21 days supply | Qty: 21 | Fill #0
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
## 2016-10-18 NOTE — Patient Instructions (Signed)
Fulvestrant injection What is this medicine? FULVESTRANT (ful VES trant) blocks the effects of estrogen. It is used to treat breast cancer. This medicine may be used for other purposes; ask your health care provider or pharmacist if you have questions. What should I tell my health care provider before I take this medicine? They need to know if you have any of these conditions: -bleeding problems -liver disease -low levels of platelets in the blood -an unusual or allergic reaction to fulvestrant, other medicines, foods, dyes, or preservatives -pregnant or trying to get pregnant -breast-feeding How should I use this medicine? This medicine is for injection into a muscle. It is usually given by a health care professional in a hospital or clinic setting. Talk to your pediatrician regarding the use of this medicine in children. Special care may be needed. Overdosage: If you think you have taken too much of this medicine contact a poison control center or emergency room at once. NOTE: This medicine is only for you. Do not share this medicine with others. What if I miss a dose? It is important not to miss your dose. Call your doctor or health care professional if you are unable to keep an appointment. What may interact with this medicine? -medicines that treat or prevent blood clots like warfarin, enoxaparin, and dalteparin This list may not describe all possible interactions. Give your health care provider a list of all the medicines, herbs, non-prescription drugs, or dietary supplements you use. Also tell them if you smoke, drink alcohol, or use illegal drugs. Some items may interact with your medicine. What should I watch for while using this medicine? Your condition will be monitored carefully while you are receiving this medicine. You will need important blood work done while you are taking this medicine. Do not become pregnant while taking this medicine or for at least 1 year after stopping  it. Women of child-bearing potential will need to have a negative pregnancy test before starting this medicine. Women should inform their doctor if they wish to become pregnant or think they might be pregnant. There is a potential for serious side effects to an unborn child. Men should inform their doctors if they wish to father a child. This medicine may lower sperm counts. Talk to your health care professional or pharmacist for more information. Do not breast-feed an infant while taking this medicine or for 1 year after the last dose. What side effects may I notice from receiving this medicine? Side effects that you should report to your doctor or health care professional as soon as possible: -allergic reactions like skin rash, itching or hives, swelling of the face, lips, or tongue -feeling faint or lightheaded, falls -pain, tingling, numbness, or weakness in the legs -signs and symptoms of infection like fever or chills; cough; flu-like symptoms; sore throat -vaginal bleeding Side effects that usually do not require medical attention (report to your doctor or health care professional if they continue or are bothersome): -aches, pains -constipation -diarrhea -headache -hot flashes -nausea, vomiting -pain at site where injected -stomach pain This list may not describe all possible side effects. Call your doctor for medical advice about side effects. You may report side effects to FDA at 1-800-FDA-1088. Where should I keep my medicine? This drug is given in a hospital or clinic and will not be stored at home. NOTE: This sheet is a summary. It may not cover all possible information. If you have questions about this medicine, talk to your doctor, pharmacist, or health   care provider.    2016, Elsevier/Gold Standard. (2015-02-25 11:03:55)  Denosumab injection What is this medicine? DENOSUMAB (den oh sue mab) slows bone breakdown. Prolia is used to treat osteoporosis in women after menopause  and in men. Xgeva is used to prevent bone fractures and other bone problems caused by cancer bone metastases. Xgeva is also used to treat giant cell tumor of the bone. This medicine may be used for other purposes; ask your health care provider or pharmacist if you have questions. What should I tell my health care provider before I take this medicine? They need to know if you have any of these conditions: -dental disease -eczema -infection or history of infections -kidney disease or on dialysis -low blood calcium or vitamin D -malabsorption syndrome -scheduled to have surgery or tooth extraction -taking medicine that contains denosumab -thyroid or parathyroid disease -an unusual reaction to denosumab, other medicines, foods, dyes, or preservatives -pregnant or trying to get pregnant -breast-feeding How should I use this medicine? This medicine is for injection under the skin. It is given by a health care professional in a hospital or clinic setting. If you are getting Prolia, a special MedGuide will be given to you by the pharmacist with each prescription and refill. Be sure to read this information carefully each time. For Prolia, talk to your pediatrician regarding the use of this medicine in children. Special care may be needed. For Xgeva, talk to your pediatrician regarding the use of this medicine in children. While this drug may be prescribed for children as young as 13 years for selected conditions, precautions do apply. Overdosage: If you think you have taken too much of this medicine contact a poison control center or emergency room at once. NOTE: This medicine is only for you. Do not share this medicine with others. What if I miss a dose? It is important not to miss your dose. Call your doctor or health care professional if you are unable to keep an appointment. What may interact with this medicine? Do not take this medicine with any of the following medications: -other medicines  containing denosumab This medicine may also interact with the following medications: -medicines that suppress the immune system -medicines that treat cancer -steroid medicines like prednisone or cortisone This list may not describe all possible interactions. Give your health care provider a list of all the medicines, herbs, non-prescription drugs, or dietary supplements you use. Also tell them if you smoke, drink alcohol, or use illegal drugs. Some items may interact with your medicine. What should I watch for while using this medicine? Visit your doctor or health care professional for regular checks on your progress. Your doctor or health care professional may order blood tests and other tests to see how you are doing. Call your doctor or health care professional if you get a cold or other infection while receiving this medicine. Do not treat yourself. This medicine may decrease your body's ability to fight infection. You should make sure you get enough calcium and vitamin D while you are taking this medicine, unless your doctor tells you not to. Discuss the foods you eat and the vitamins you take with your health care professional. See your dentist regularly. Brush and floss your teeth as directed. Before you have any dental work done, tell your dentist you are receiving this medicine. Do not become pregnant while taking this medicine or for 5 months after stopping it. Women should inform their doctor if they wish to become pregnant or think   they might be pregnant. There is a potential for serious side effects to an unborn child. Talk to your health care professional or pharmacist for more information. What side effects may I notice from receiving this medicine? Side effects that you should report to your doctor or health care professional as soon as possible: -allergic reactions like skin rash, itching or hives, swelling of the face, lips, or tongue -breathing problems -chest pain -fast,  irregular heartbeat -feeling faint or lightheaded, falls -fever, chills, or any other sign of infection -muscle spasms, tightening, or twitches -numbness or tingling -skin blisters or bumps, or is dry, peels, or red -slow healing or unexplained pain in the mouth or jaw -unusual bleeding or bruising Side effects that usually do not require medical attention (Report these to your doctor or health care professional if they continue or are bothersome.): -muscle pain -stomach upset, gas This list may not describe all possible side effects. Call your doctor for medical advice about side effects. You may report side effects to FDA at 1-800-FDA-1088. Where should I keep my medicine? This medicine is only given in a clinic, doctor's office, or other health care setting and will not be stored at home. NOTE: This sheet is a summary. It may not cover all possible information. If you have questions about this medicine, talk to your doctor, pharmacist, or health care provider.    2016, Elsevier/Gold Standard. (2012-01-28 12:37:47)   

## 2016-10-19 NOTE — Telephone Encounter (Signed)
Lorazepam was called into pharmacy on 9th March 2018 @ 8:50am by DD

## 2016-10-22 ENCOUNTER — Other Ambulatory Visit: Payer: Self-pay | Admitting: Cardiovascular Disease

## 2016-10-23 ENCOUNTER — Other Ambulatory Visit: Payer: Self-pay | Admitting: *Deleted

## 2016-10-23 MED ORDER — BISOPROLOL FUMARATE 5 MG PO TABS: 5.0000 mg | ORAL_TABLET | Freq: Every day | ORAL | 1 refills | Status: DC

## 2016-10-25 ENCOUNTER — Other Ambulatory Visit: Payer: Self-pay | Admitting: Hematology and Oncology

## 2016-10-26 ENCOUNTER — Other Ambulatory Visit: Payer: Self-pay | Admitting: Hematology and Oncology

## 2016-11-06 ENCOUNTER — Ambulatory Visit: Payer: Self-pay | Admitting: Physician Assistant

## 2016-11-08 ENCOUNTER — Ambulatory Visit (INDEPENDENT_AMBULATORY_CARE_PROVIDER_SITE_OTHER): Payer: Medicare Other | Admitting: Cardiovascular Disease

## 2016-11-08 ENCOUNTER — Encounter: Payer: Self-pay | Admitting: Cardiovascular Disease

## 2016-11-08 VITALS — BP 100/62 | HR 76 | Ht 65.0 in | Wt 177.5 lb

## 2016-11-08 DIAGNOSIS — R0600 Dyspnea, unspecified: Secondary | ICD-10-CM | POA: Diagnosis not present

## 2016-11-08 MED ORDER — BISOPROLOL FUMARATE 5 MG PO TABS: 5.0000 mg | ORAL_TABLET | Freq: Every day | ORAL | 3 refills | Status: DC

## 2016-11-08 NOTE — Patient Instructions (Addendum)
Medication Instructions:  Your physician recommends that you continue on your current medications as directed. Please refer to the Current Medication list given to you today.   Labwork: none  Testing/Procedures: Your physician has requested that you have an echocardiogram. Echocardiography is a painless test that uses sound waves to create images of your heart. It provides your doctor with information about the size and shape of your heart and how well your heart's chambers and valves are working. This procedure takes approximately one hour. There are no restrictions for this procedure.    Follow-Up: Your physician wants you to follow-up in: 1 year with Dr. Arida.  You will receive a reminder letter in the mail two months in advance. If you don't receive a letter, please call our office to schedule the follow-up appointment.   Any Other Special Instructions Will Be Listed Below (If Applicable).     If you need a refill on your cardiac medications before your next appointment, please call your pharmacy.  Echocardiogram An echocardiogram, or echocardiography, uses sound waves (ultrasound) to produce an image of your heart. The echocardiogram is simple, painless, obtained within a short period of time, and offers valuable information to your health care provider. The images from an echocardiogram can provide information such as:  Evidence of coronary artery disease (CAD).  Heart size.  Heart muscle function.  Heart valve function.  Aneurysm detection.  Evidence of a past heart attack.  Fluid buildup around the heart.  Heart muscle thickening.  Assess heart valve function.  Tell a health care provider about:  Any allergies you have.  All medicines you are taking, including vitamins, herbs, eye drops, creams, and over-the-counter medicines.  Any problems you or family members have had with anesthetic medicines.  Any blood disorders you have.  Any surgeries you have  had.  Any medical conditions you have.  Whether you are pregnant or may be pregnant. What happens before the procedure? No special preparation is needed. Eat and drink normally. What happens during the procedure?  In order to produce an image of your heart, gel will be applied to your chest and a wand-like tool (transducer) will be moved over your chest. The gel will help transmit the sound waves from the transducer. The sound waves will harmlessly bounce off your heart to allow the heart images to be captured in real-time motion. These images will then be recorded.  You may need an IV to receive a medicine that improves the quality of the pictures. What happens after the procedure? You may return to your normal schedule including diet, activities, and medicines, unless your health care provider tells you otherwise. This information is not intended to replace advice given to you by your health care provider. Make sure you discuss any questions you have with your health care provider. Document Released: 07/27/2000 Document Revised: 03/17/2016 Document Reviewed: 04/06/2013 Elsevier Interactive Patient Education  2017 Elsevier Inc.  

## 2016-11-08 NOTE — Progress Notes (Signed)
Cardiology Office Note   Date:  11/08/2016   ID:  Suzanne [name], DOB May 20, 1956, MRN [id number]  PCP:  Alesia Richards, MD  Cardiologist:   Kathlyn Sacramento, MD   Chief Complaint  Patient presents with  . other    1 year follow up. Meds reviewed by the pt. verbally. "doing well."       History of Present Illness: Suzanne [name] is a 61 y.o. female who presents for a follow-up visit regarding previous nonischemic cardiomyopathy. She is a former [employer] scientist. She has history of recurrent breast cancer. She had previous right mastectomy and multiple courses of chemotherapy and radiation therapy. She developed dyspnea in 2007 and found to have weakened R hemidiaphragm of unclear cause.  She underwent cardiac catheterization in June 2011 which showed low-normal EF of 50-55%, normal coronary arteries and normal right heart pressures. She has known history of alcohol and benzodiazepine abuse but attended successful inpatient detox program.  Most recent echocardiogram and February 2016 showed normal LV systolic function with an ejection fraction of 42%, grade 1 diastolic dysfunction and no evidence of pulmonary hypertension. Unfortunately, she was diagnosed last year with recurrent breast cancer but has responded well to treatment. She continues to complain of dyspnea with some worsening recently.She describes that she cannot sleep on her left side due to worsening dyspnea. No chest pain.  Past Medical History:  Diagnosis Date  . Alcoholism ([place])   . Cancer [facility])    breast ca - 1994; recurred in 2007. s/p masectomy with flap rconstruction aand chemo '94. local recurrence on chest wall. chemo and XRT '07. now femara  . Depression   . Dyspnea    myoview 2011: EF 55% question of  mild reverible anterior defect. thought to be breast attenuation. echo 45-50% with global HK. Grade 1 diastolyic dysfunction. RV nml. cardiac MRI with EF 44% with septal HK. no scar   . History of coronary artery stent  placement   . Hyperlipidemia   . Hypertension   . Insomnia   . Lung disorder     Past Surgical History:  Procedure Laterality Date  . CARDIAC CATHETERIZATION     [facility]  . MASTECTOMY    . ORIF DISTAL RADIUS FRACTURE    . PORT-A-CATH REMOVAL  07/29/2012   Procedure: REMOVAL PORT-A-CATH;  Surgeon: Haywood Lasso, MD;  Location: [place];  Service: General;  Laterality: Left;  . R masectomy  '94   . R transflap  '94     Current Outpatient Prescriptions  Medication Sig Dispense Refill  . albuterol (PROVENTIL) (2.5 MG/3ML) 0.083% nebulizer solution Take 3 mLs (2.5 mg total) by nebulization every 6 (six) hours as needed for wheezing or shortness of breath. 75 mL 12  . bisoprolol (ZEBETA) 5 MG tablet Take 1 tablet (5 mg total) by mouth daily. 90 tablet 3  . calcium carbonate (OS-CAL - DOSED IN MG OF ELEMENTAL CALCIUM) 1250 (500 Ca) MG tablet Take 1 tablet by mouth.    . cholecalciferol (VITAMIN D) 1000 units tablet Take 1,000 Units by mouth daily.    Marland Kitchen gabapentin (NEURONTIN) 300 MG capsule Take 1 capsule (300 mg total) by mouth 3 (three) times daily. One twice daily 90 capsule 3  . ibuprofen (ADVIL,MOTRIN) 200 MG tablet Take 200 mg by mouth every 6 (six) hours as needed.    Marland Kitchen letrozole (FEMARA) 2.5 MG tablet TAKE 1 TABLET BY MOUTH DAILY. 90 tablet 3  . LORazepam (ATIVAN) 2 MG tablet  TAKE 1/2 TO 1 TABLET BY MOUTH AT BEDTIME 30 tablet 2  . palbociclib (IBRANCE) 125 MG capsule TAKE 1 CAPSULE BY MOUTH ONCE DAILY WITH BREAKFAST. TAKE WHOLE WITH FOOD. 21 capsule 6  . PROAIR HFA 108 (90 Base) MCG/ACT inhaler INHALE 1 PUFF BY MOUTH EVERY 6 HOURS AS NEEDED FOR WHEEZING OR SHORTNESS OF BREATH 8.5 g 0  . promethazine (PHENERGAN) 25 MG tablet Take 1 tablet (25 mg total) by mouth every 6 (six) hours as needed for nausea. 30 tablet 3   No current facility-administered medications for this visit.    Facility-Administered Medications Ordered in Other Visits  Medication Dose  Route Frequency Provider Last Rate Last Dose  . fulvestrant (FASLODEX) injection 500 mg  500 mg Intramuscular Q14 Days Nicholas Lose, MD   500 mg at 06/25/16 1205  . fulvestrant (FASLODEX) injection 500 mg  500 mg Intramuscular Q30 days Nicholas Lose, MD   500 mg at 07/26/16 1418  . fulvestrant (FASLODEX) injection 500 mg  500 mg Intramuscular Q30 days Nicholas Lose, MD   500 mg at 10/18/16 1222    Allergies:   Ace inhibitors    Social History:  The patient  reports that she has never smoked. She has never used smokeless tobacco. She reports that she drinks alcohol. She reports that she does not use drugs.   Family History:  The patient's family history includes Alcohol abuse in her brother, father, paternal grandfather, and paternal uncle; Cancer in her paternal grandmother; Heart attack in her father; Hypertension in her mother.    ROS:  Please see the history of present illness.   Otherwise, review of systems are positive for none.   All other systems are reviewed and negative.    PHYSICAL EXAM: VS:  BP 100/62 (BP Location: Left Arm, Patient Position: Sitting, Cuff Size: Normal)   Pulse 76   Ht 5\' 5"  (1.651 m)   Wt 177 lb 8 oz (80.5 kg)   BMI 29.54 kg/m  , BMI Body mass index is 29.54 kg/m. GEN: Well nourished, well developed, in no acute distress  HEENT: normal  Neck: no JVD, carotid bruits, or masses Cardiac: RRR; no murmurs, rubs, or gallops,no edema  Respiratory:  clear to auscultation bilaterally, normal work of breathing GI: soft, nontender, nondistended, + BS MS: no deformity or atrophy  Skin: warm and dry, no rash Neuro:  Strength and sensation are intact Psych: euthymic mood, full affect   EKG:  EKG is ordered today. The ekg ordered today demonstrates normal sinus rhythm with no significant ST or T wave changes.   Recent Labs: 03/20/2016: Pro B Natriuretic peptide (BNP) 19.0; TSH 0.98 10/18/2016: ALT 82; BUN 11.4; Creatinine 0.8; HGB 14.5; Platelets 152; Potassium 3.5;  Sodium 138    Lipid Panel    Component Value Date/Time   CHOL 181 08/29/2015 1517   TRIG 133 08/29/2015 1517   HDL 58 08/29/2015 1517   CHOLHDL 3.1 08/29/2015 1517   VLDL 27 08/29/2015 1517   LDLCALC 96 08/29/2015 1517      Wt Readings from Last 3 Encounters:  11/08/16 177 lb 8 oz (80.5 kg)  10/18/16 174 lb 14.4 oz (79.3 kg)  09/20/16 175 lb 14.4 oz (79.8 kg)      No flowsheet data found.    ASSESSMENT AND PLAN:  1.   History of nonischemic cardiomyopathy with low normal ejection fraction. No coronary artery disease was noted on prior cardiac catheterization. Some worsening of dyspnea especially if she sleeps on  the left side. Thus, I requested an echocardiogram to evaluate LV systolic function and possible shunting through a PFO.  2. Recurrent breast cancer: Responded well to treatment.  Disposition:   FU with me in 1 year  Signed,  Kathlyn Sacramento, MD  11/08/2016 4:36 PM    [facility]

## 2016-11-12 ENCOUNTER — Other Ambulatory Visit: Payer: Self-pay

## 2016-11-12 ENCOUNTER — Ambulatory Visit: Payer: Self-pay

## 2016-11-13 MED FILL — IBRANCE 125 MG CAPSULE: 125 | 21 days supply | Qty: 21 | Fill #1

## 2016-11-14 MED FILL — PROMETHAZINE 25 MG TABLET: 25 | 7 days supply | Qty: 30 | Fill #1

## 2016-11-15 ENCOUNTER — Ambulatory Visit (HOSPITAL_BASED_OUTPATIENT_CLINIC_OR_DEPARTMENT_OTHER): Payer: Medicare Other

## 2016-11-15 ENCOUNTER — Other Ambulatory Visit (HOSPITAL_BASED_OUTPATIENT_CLINIC_OR_DEPARTMENT_OTHER): Payer: Medicare Other

## 2016-11-15 VITALS — BP 115/68 | HR 68 | Temp 98.6°F | Resp 18

## 2016-11-15 DIAGNOSIS — C50511 Malignant neoplasm of lower-outer quadrant of right female breast: Secondary | ICD-10-CM | POA: Diagnosis not present

## 2016-11-15 DIAGNOSIS — C7951 Secondary malignant neoplasm of bone: Secondary | ICD-10-CM

## 2016-11-15 DIAGNOSIS — Z5111 Encounter for antineoplastic chemotherapy: Secondary | ICD-10-CM

## 2016-11-15 DIAGNOSIS — Z170 Estrogen receptor positive status [ER+]: Principal | ICD-10-CM

## 2016-11-15 LAB — COMPREHENSIVE METABOLIC PANEL
ALBUMIN: 3.9 g/dL (ref 3.5–5.0)
ALT: 42 U/L (ref 0–55)
ANION GAP: 9 meq/L (ref 3–11)
AST: 52 U/L — ABNORMAL HIGH (ref 5–34)
Alkaline Phosphatase: 62 U/L (ref 40–150)
BILIRUBIN TOTAL: 1.21 mg/dL — AB (ref 0.20–1.20)
BUN: 11.2 mg/dL (ref 7.0–26.0)
CALCIUM: 9.6 mg/dL (ref 8.4–10.4)
CHLORIDE: 103 meq/L (ref 98–109)
CO2: 27 mEq/L (ref 22–29)
CREATININE: 0.8 mg/dL (ref 0.6–1.1)
EGFR: 77 mL/min/{1.73_m2} — ABNORMAL LOW (ref >90)
Glucose: 115 mg/dl (ref 70–140)
Potassium: 4.1 mEq/L (ref 3.5–5.1)
Sodium: 138 mEq/L (ref 136–145)
Total Protein: 7.3 g/dL (ref 6.4–8.3)

## 2016-11-15 LAB — CBC WITH DIFFERENTIAL/PLATELET
BASO%: 2.3 % — AB (ref 0.0–2.0)
BASOS ABS: 0.1 10*3/uL (ref 0.0–0.1)
EOS%: 1.1 % (ref 0.0–7.0)
Eosinophils Absolute: 0 10*3/uL (ref 0.0–0.5)
HEMATOCRIT: 37.8 % (ref 34.8–46.6)
HEMOGLOBIN: 13.4 g/dL (ref 11.6–15.9)
LYMPH#: 1.2 10*3/uL (ref 0.9–3.3)
LYMPH%: 44.1 % (ref 14.0–49.7)
MCH: 40.6 pg — AB (ref 25.1–34.0)
MCHC: 35.4 g/dL (ref 31.5–36.0)
MCV: 114.5 fL — ABNORMAL HIGH (ref 79.5–101.0)
MONO#: 0.2 10*3/uL (ref 0.1–0.9)
MONO%: 6.5 % (ref 0.0–14.0)
NEUT#: 1.2 10*3/uL — ABNORMAL LOW (ref 1.5–6.5)
NEUT%: 46 % (ref 38.4–76.8)
PLATELETS: 113 10*3/uL — AB (ref 145–400)
RBC: 3.3 10*6/uL — ABNORMAL LOW (ref 3.70–5.45)
RDW: 14.1 % (ref 11.2–14.5)
WBC: 2.6 10*3/uL — ABNORMAL LOW (ref 3.9–10.3)

## 2016-11-15 MED ORDER — FULVESTRANT 250 MG/5ML IM SOLN
500.0000 mg | Freq: Once | INTRAMUSCULAR | Status: AC
  Administered 2016-11-15: 500 mg via INTRAMUSCULAR
  Filled 2016-11-15: qty 10

## 2016-11-15 MED ORDER — DENOSUMAB 120 MG/1.7ML ~~LOC~~ SOLN
120.0000 mg | Freq: Once | SUBCUTANEOUS | Status: AC
  Administered 2016-11-15: 120 mg via SUBCUTANEOUS
  Filled 2016-11-15: qty 1.7

## 2016-11-15 NOTE — Patient Instructions (Signed)
Denosumab injection What is this medicine? DENOSUMAB (den oh sue mab) slows bone breakdown. Prolia is used to treat osteoporosis in women after menopause and in men. Delton See is used to treat a high calcium level due to cancer and to prevent bone fractures and other bone problems caused by multiple myeloma or cancer bone metastases. Delton See is also used to treat giant cell tumor of the bone. This medicine may be used for other purposes; ask your health care provider or pharmacist if you have questions. COMMON BRAND NAME(S): Prolia, XGEVA What should I tell my health care provider before I take this medicine? They need to know if you have any of these conditions: -dental disease -having surgery or tooth extraction -infection -kidney disease -low levels of calcium or Vitamin D in the blood -malnutrition -on hemodialysis -skin conditions or sensitivity -thyroid or parathyroid disease -an unusual reaction to denosumab, other medicines, foods, dyes, or preservatives -pregnant or trying to get pregnant -breast-feeding How should I use this medicine? This medicine is for injection under the skin. It is given by a health care professional in a hospital or clinic setting. If you are getting Prolia, a special MedGuide will be given to you by the pharmacist with each prescription and refill. Be sure to read this information carefully each time. For Prolia, talk to your pediatrician regarding the use of this medicine in children. Special care may be needed. For Delton See, talk to your pediatrician regarding the use of this medicine in children. While this drug may be prescribed for children as young as 13 years for selected conditions, precautions do apply. Overdosage: If you think you have taken too much of this medicine contact a poison control center or emergency room at once. NOTE: This medicine is only for you. Do not share this medicine with others. What if I miss a dose? It is important not to miss your  dose. Call your doctor or health care professional if you are unable to keep an appointment. What may interact with this medicine? Do not take this medicine with any of the following medications: -other medicines containing denosumab This medicine may also interact with the following medications: -medicines that lower your chance of fighting infection -steroid medicines like prednisone or cortisone This list may not describe all possible interactions. Give your health care provider a list of all the medicines, herbs, non-prescription drugs, or dietary supplements you use. Also tell them if you smoke, drink alcohol, or use illegal drugs. Some items may interact with your medicine. What should I watch for while using this medicine? Visit your doctor or health care professional for regular checks on your progress. Your doctor or health care professional may order blood tests and other tests to see how you are doing. Call your doctor or health care professional for advice if you get a fever, chills or sore throat, or other symptoms of a cold or flu. Do not treat yourself. This drug may decrease your body's ability to fight infection. Try to avoid being around people who are sick. You should make sure you get enough calcium and vitamin D while you are taking this medicine, unless your doctor tells you not to. Discuss the foods you eat and the vitamins you take with your health care professional. See your dentist regularly. Brush and floss your teeth as directed. Before you have any dental work done, tell your dentist you are receiving this medicine. Do not become pregnant while taking this medicine or for 5 months after stopping  it. Talk with your doctor or health care professional about your birth control options while taking this medicine. Women should inform their doctor if they wish to become pregnant or think they might be pregnant. There is a potential for serious side effects to an unborn child. Talk  to your health care professional or pharmacist for more information. What side effects may I notice from receiving this medicine? Side effects that you should report to your doctor or health care professional as soon as possible: -allergic reactions like skin rash, itching or hives, swelling of the face, lips, or tongue -bone pain -breathing problems -dizziness -jaw pain, especially after dental work -redness, blistering, peeling of the skin -signs and symptoms of infection like fever or chills; cough; sore throat; pain or trouble passing urine -signs of low calcium like fast heartbeat, muscle cramps or muscle pain; pain, tingling, numbness in the hands or feet; seizures -unusual bleeding or bruising -unusually weak or tired Side effects that usually do not require medical attention (report to your doctor or health care professional if they continue or are bothersome): -constipation -diarrhea -headache -joint pain -loss of appetite -muscle pain -runny nose -tiredness -upset stomach This list may not describe all possible side effects. Call your doctor for medical advice about side effects. You may report side effects to FDA at 1-800-FDA-1088. Where should I keep my medicine? This medicine is only given in a clinic, doctor's office, or other health care setting and will not be stored at home. NOTE: This sheet is a summary. It may not cover all possible information. If you have questions about this medicine, talk to your doctor, pharmacist, or health care provider.  2018 Elsevier/Gold Standard (2016-08-21 19:17:21)   Fulvestrant injection What is this medicine? FULVESTRANT (ful VES trant) blocks the effects of estrogen. It is used to treat breast cancer. This medicine may be used for other purposes; ask your health care provider or pharmacist if you have questions. COMMON BRAND NAME(S): FASLODEX What should I tell my health care provider before I take this medicine? They need to  know if you have any of these conditions: -bleeding problems -liver disease -low levels of platelets in the blood -an unusual or allergic reaction to fulvestrant, other medicines, foods, dyes, or preservatives -pregnant or trying to get pregnant -breast-feeding How should I use this medicine? This medicine is for injection into a muscle. It is usually given by a health care professional in a hospital or clinic setting. Talk to your pediatrician regarding the use of this medicine in children. Special care may be needed. Overdosage: If you think you have taken too much of this medicine contact a poison control center or emergency room at once. NOTE: This medicine is only for you. Do not share this medicine with others. What if I miss a dose? It is important not to miss your dose. Call your doctor or health care professional if you are unable to keep an appointment. What may interact with this medicine? -medicines that treat or prevent blood clots like warfarin, enoxaparin, and dalteparin This list may not describe all possible interactions. Give your health care provider a list of all the medicines, herbs, non-prescription drugs, or dietary supplements you use. Also tell them if you smoke, drink alcohol, or use illegal drugs. Some items may interact with your medicine. What should I watch for while using this medicine? Your condition will be monitored carefully while you are receiving this medicine. You will need important blood work done while you   are taking this medicine. Do not become pregnant while taking this medicine or for at least 1 year after stopping it. Women of child-bearing potential will need to have a negative pregnancy test before starting this medicine. Women should inform their doctor if they wish to become pregnant or think they might be pregnant. There is a potential for serious side effects to an unborn child. Men should inform their doctors if they wish to father a child. This  medicine may lower sperm counts. Talk to your health care professional or pharmacist for more information. Do not breast-feed an infant while taking this medicine or for 1 year after the last dose. What side effects may I notice from receiving this medicine? Side effects that you should report to your doctor or health care professional as soon as possible: -allergic reactions like skin rash, itching or hives, swelling of the face, lips, or tongue -feeling faint or lightheaded, falls -pain, tingling, numbness, or weakness in the legs -signs and symptoms of infection like fever or chills; cough; flu-like symptoms; sore throat -vaginal bleeding Side effects that usually do not require medical attention (report to your doctor or health care professional if they continue or are bothersome): -aches, pains -constipation -diarrhea -headache -hot flashes -nausea, vomiting -pain at site where injected -stomach pain This list may not describe all possible side effects. Call your doctor for medical advice about side effects. You may report side effects to FDA at 1-800-FDA-1088. Where should I keep my medicine? This drug is given in a hospital or clinic and will not be stored at home. NOTE: This sheet is a summary. It may not cover all possible information. If you have questions about this medicine, talk to your doctor, pharmacist, or health care provider.  2018 Elsevier/Gold Standard (2015-02-25 11:03:55)  

## 2016-11-21 ENCOUNTER — Other Ambulatory Visit: Payer: Self-pay | Admitting: Hematology and Oncology

## 2016-12-04 ENCOUNTER — Ambulatory Visit: Payer: Self-pay | Admitting: Cardiovascular Disease

## 2016-12-12 MED FILL — LORazepam 2 MG TABS: 2 | 30 days supply | Qty: 30 | Fill #0

## 2016-12-12 MED FILL — IBRANCE 125 MG CAPSULE: 125 | 21 days supply | Qty: 21 | Fill #2
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
## 2016-12-13 ENCOUNTER — Encounter: Payer: Self-pay | Admitting: Hematology and Oncology

## 2016-12-13 ENCOUNTER — Other Ambulatory Visit (HOSPITAL_BASED_OUTPATIENT_CLINIC_OR_DEPARTMENT_OTHER): Payer: Medicare Other

## 2016-12-13 ENCOUNTER — Other Ambulatory Visit: Payer: Self-pay | Admitting: Emergency Medicine

## 2016-12-13 ENCOUNTER — Ambulatory Visit (HOSPITAL_BASED_OUTPATIENT_CLINIC_OR_DEPARTMENT_OTHER): Payer: Medicare Other

## 2016-12-13 ENCOUNTER — Ambulatory Visit (HOSPITAL_BASED_OUTPATIENT_CLINIC_OR_DEPARTMENT_OTHER): Payer: Medicare Other | Admitting: Hematology and Oncology

## 2016-12-13 DIAGNOSIS — C787 Secondary malignant neoplasm of liver and intrahepatic bile duct: Secondary | ICD-10-CM

## 2016-12-13 DIAGNOSIS — Z170 Estrogen receptor positive status [ER+]: Principal | ICD-10-CM

## 2016-12-13 DIAGNOSIS — Z5111 Encounter for antineoplastic chemotherapy: Secondary | ICD-10-CM | POA: Diagnosis not present

## 2016-12-13 DIAGNOSIS — D701 Agranulocytosis secondary to cancer chemotherapy: Secondary | ICD-10-CM | POA: Diagnosis not present

## 2016-12-13 DIAGNOSIS — C50511 Malignant neoplasm of lower-outer quadrant of right female breast: Secondary | ICD-10-CM | POA: Diagnosis not present

## 2016-12-13 DIAGNOSIS — C7951 Secondary malignant neoplasm of bone: Secondary | ICD-10-CM

## 2016-12-13 LAB — COMPREHENSIVE METABOLIC PANEL
ALT: 78 U/L — ABNORMAL HIGH (ref 0–55)
AST: 86 U/L — AB (ref 5–34)
Albumin: 3.8 g/dL (ref 3.5–5.0)
Alkaline Phosphatase: 68 U/L (ref 40–150)
Anion Gap: 7 mEq/L (ref 3–11)
BUN: 8.8 mg/dL (ref 7.0–26.0)
CALCIUM: 9.7 mg/dL (ref 8.4–10.4)
CHLORIDE: 102 meq/L (ref 98–109)
CO2: 30 mEq/L — ABNORMAL HIGH (ref 22–29)
Creatinine: 0.8 mg/dL (ref 0.6–1.1)
EGFR: 86 mL/min/{1.73_m2} — AB (ref >90)
GLUCOSE: 101 mg/dL (ref 70–140)
POTASSIUM: 3.9 meq/L (ref 3.5–5.1)
SODIUM: 139 meq/L (ref 136–145)
Total Bilirubin: 0.83 mg/dL (ref 0.20–1.20)
Total Protein: 7.2 g/dL (ref 6.4–8.3)

## 2016-12-13 LAB — CBC WITH DIFFERENTIAL/PLATELET
BASO%: 2.1 % — ABNORMAL HIGH (ref 0.0–2.0)
BASOS ABS: 0.1 10*3/uL (ref 0.0–0.1)
EOS%: 0.4 % (ref 0.0–7.0)
Eosinophils Absolute: 0 10*3/uL (ref 0.0–0.5)
HCT: 36.5 % (ref 34.8–46.6)
HGB: 13 g/dL (ref 11.6–15.9)
LYMPH%: 45 % (ref 14.0–49.7)
MCH: 41.8 pg — AB (ref 25.1–34.0)
MCHC: 35.6 g/dL (ref 31.5–36.0)
MCV: 117.4 fL — AB (ref 79.5–101.0)
MONO#: 0.2 10*3/uL (ref 0.1–0.9)
MONO%: 6.7 % (ref 0.0–14.0)
NEUT#: 1.1 10*3/uL — ABNORMAL LOW (ref 1.5–6.5)
NEUT%: 45.8 % (ref 38.4–76.8)
Platelets: 114 10*3/uL — ABNORMAL LOW (ref 145–400)
RBC: 3.11 10*6/uL — ABNORMAL LOW (ref 3.70–5.45)
RDW: 14 % (ref 11.2–14.5)
WBC: 2.4 10*3/uL — ABNORMAL LOW (ref 3.9–10.3)
lymph#: 1.1 10*3/uL (ref 0.9–3.3)

## 2016-12-13 MED ORDER — FULVESTRANT 250 MG/5ML IM SOLN
500.0000 mg | Freq: Once | INTRAMUSCULAR | Status: AC
  Administered 2016-12-13: 500 mg via INTRAMUSCULAR
  Filled 2016-12-13: qty 10

## 2016-12-13 MED ORDER — DENOSUMAB 120 MG/1.7ML ~~LOC~~ SOLN
120.0000 mg | Freq: Once | SUBCUTANEOUS | Status: AC
  Administered 2016-12-13: 120 mg via SUBCUTANEOUS
  Filled 2016-12-13: qty 1.7

## 2016-12-13 NOTE — Patient Instructions (Signed)
Fulvestrant injection What is this medicine? FULVESTRANT (ful VES trant) blocks the effects of estrogen. It is used to treat breast cancer. This medicine may be used for other purposes; ask your health care provider or pharmacist if you have questions. COMMON BRAND NAME(S): FASLODEX What should I tell my health care provider before I take this medicine? They need to know if you have any of these conditions: -bleeding problems -liver disease -low levels of platelets in the blood -an unusual or allergic reaction to fulvestrant, other medicines, foods, dyes, or preservatives -pregnant or trying to get pregnant -breast-feeding How should I use this medicine? This medicine is for injection into a muscle. It is usually given by a health care professional in a hospital or clinic setting. Talk to your pediatrician regarding the use of this medicine in children. Special care may be needed. Overdosage: If you think you have taken too much of this medicine contact a poison control center or emergency room at once. NOTE: This medicine is only for you. Do not share this medicine with others. What if I miss a dose? It is important not to miss your dose. Call your doctor or health care professional if you are unable to keep an appointment. What may interact with this medicine? -medicines that treat or prevent blood clots like warfarin, enoxaparin, and dalteparin This list may not describe all possible interactions. Give your health care provider a list of all the medicines, herbs, non-prescription drugs, or dietary supplements you use. Also tell them if you smoke, drink alcohol, or use illegal drugs. Some items may interact with your medicine. What should I watch for while using this medicine? Your condition will be monitored carefully while you are receiving this medicine. You will need important blood work done while you are taking this medicine. Do not become pregnant while taking this medicine or for  at least 1 year after stopping it. Women of child-bearing potential will need to have a negative pregnancy test before starting this medicine. Women should inform their doctor if they wish to become pregnant or think they might be pregnant. There is a potential for serious side effects to an unborn child. Men should inform their doctors if they wish to father a child. This medicine may lower sperm counts. Talk to your health care professional or pharmacist for more information. Do not breast-feed an infant while taking this medicine or for 1 year after the last dose. What side effects may I notice from receiving this medicine? Side effects that you should report to your doctor or health care professional as soon as possible: -allergic reactions like skin rash, itching or hives, swelling of the face, lips, or tongue -feeling faint or lightheaded, falls -pain, tingling, numbness, or weakness in the legs -signs and symptoms of infection like fever or chills; cough; flu-like symptoms; sore throat -vaginal bleeding Side effects that usually do not require medical attention (report to your doctor or health care professional if they continue or are bothersome): -aches, pains -constipation -diarrhea -headache -hot flashes -nausea, vomiting -pain at site where injected -stomach pain This list may not describe all possible side effects. Call your doctor for medical advice about side effects. You may report side effects to FDA at 1-800-FDA-1088. Where should I keep my medicine? This drug is given in a hospital or clinic and will not be stored at home. NOTE: This sheet is a summary. It may not cover all possible information. If you have questions about this medicine, talk to your   doctor, pharmacist, or health care provider.  2018 Elsevier/Gold Standard (2015-02-25 11:03:55) Denosumab injection What is this medicine? DENOSUMAB (den oh sue mab) slows bone breakdown. Prolia is used to treat osteoporosis in  women after menopause and in men. Xgeva is used to treat a high calcium level due to cancer and to prevent bone fractures and other bone problems caused by multiple myeloma or cancer bone metastases. Xgeva is also used to treat giant cell tumor of the bone. This medicine may be used for other purposes; ask your health care provider or pharmacist if you have questions. COMMON BRAND NAME(S): Prolia, XGEVA What should I tell my health care provider before I take this medicine? They need to know if you have any of these conditions: -dental disease -having surgery or tooth extraction -infection -kidney disease -low levels of calcium or Vitamin D in the blood -malnutrition -on hemodialysis -skin conditions or sensitivity -thyroid or parathyroid disease -an unusual reaction to denosumab, other medicines, foods, dyes, or preservatives -pregnant or trying to get pregnant -breast-feeding How should I use this medicine? This medicine is for injection under the skin. It is given by a health care professional in a hospital or clinic setting. If you are getting Prolia, a special MedGuide will be given to you by the pharmacist with each prescription and refill. Be sure to read this information carefully each time. For Prolia, talk to your pediatrician regarding the use of this medicine in children. Special care may be needed. For Xgeva, talk to your pediatrician regarding the use of this medicine in children. While this drug may be prescribed for children as young as 13 years for selected conditions, precautions do apply. Overdosage: If you think you have taken too much of this medicine contact a poison control center or emergency room at once. NOTE: This medicine is only for you. Do not share this medicine with others. What if I miss a dose? It is important not to miss your dose. Call your doctor or health care professional if you are unable to keep an appointment. What may interact with this  medicine? Do not take this medicine with any of the following medications: -other medicines containing denosumab This medicine may also interact with the following medications: -medicines that lower your chance of fighting infection -steroid medicines like prednisone or cortisone This list may not describe all possible interactions. Give your health care provider a list of all the medicines, herbs, non-prescription drugs, or dietary supplements you use. Also tell them if you smoke, drink alcohol, or use illegal drugs. Some items may interact with your medicine. What should I watch for while using this medicine? Visit your doctor or health care professional for regular checks on your progress. Your doctor or health care professional may order blood tests and other tests to see how you are doing. Call your doctor or health care professional for advice if you get a fever, chills or sore throat, or other symptoms of a cold or flu. Do not treat yourself. This drug may decrease your body's ability to fight infection. Try to avoid being around people who are sick. You should make sure you get enough calcium and vitamin D while you are taking this medicine, unless your doctor tells you not to. Discuss the foods you eat and the vitamins you take with your health care professional. See your dentist regularly. Brush and floss your teeth as directed. Before you have any dental work done, tell your dentist you are receiving this medicine. Do   not become pregnant while taking this medicine or for 5 months after stopping it. Talk with your doctor or health care professional about your birth control options while taking this medicine. Women should inform their doctor if they wish to become pregnant or think they might be pregnant. There is a potential for serious side effects to an unborn child. Talk to your health care professional or pharmacist for more information. What side effects may I notice from receiving this  medicine? Side effects that you should report to your doctor or health care professional as soon as possible: -allergic reactions like skin rash, itching or hives, swelling of the face, lips, or tongue -bone pain -breathing problems -dizziness -jaw pain, especially after dental work -redness, blistering, peeling of the skin -signs and symptoms of infection like fever or chills; cough; sore throat; pain or trouble passing urine -signs of low calcium like fast heartbeat, muscle cramps or muscle pain; pain, tingling, numbness in the hands or feet; seizures -unusual bleeding or bruising -unusually weak or tired Side effects that usually do not require medical attention (report to your doctor or health care professional if they continue or are bothersome): -constipation -diarrhea -headache -joint pain -loss of appetite -muscle pain -runny nose -tiredness -upset stomach This list may not describe all possible side effects. Call your doctor for medical advice about side effects. You may report side effects to FDA at 1-800-FDA-1088. Where should I keep my medicine? This medicine is only given in a clinic, doctor's office, or other health care setting and will not be stored at home. NOTE: This sheet is a summary. It may not cover all possible information. If you have questions about this medicine, talk to your doctor, pharmacist, or health care provider.  2018 Elsevier/Gold Standard (2016-08-21 19:17:21)  
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
## 2016-12-14 MED FILL — PROAIR HFA 90 MCG INHALER: 108 (90 BAS | 25 days supply | Qty: 9 | Fill #0

## 2016-12-18 ENCOUNTER — Other Ambulatory Visit: Payer: Self-pay | Admitting: Cardiovascular Disease

## 2016-12-18 MED FILL — ALBUTEROL 0.083% INHAL SOLN: (2.5 MG/3ML | 7 days supply | Qty: 90 | Fill #0

## 2016-12-19 ENCOUNTER — Other Ambulatory Visit: Payer: Self-pay

## 2016-12-31 MED FILL — IBRANCE 125 MG CAPSULE: 125 | 21 days supply | Qty: 21 | Fill #3

## 2017-01-10 ENCOUNTER — Other Ambulatory Visit (HOSPITAL_BASED_OUTPATIENT_CLINIC_OR_DEPARTMENT_OTHER): Payer: Medicare Other

## 2017-01-10 ENCOUNTER — Ambulatory Visit (HOSPITAL_BASED_OUTPATIENT_CLINIC_OR_DEPARTMENT_OTHER): Payer: Medicare Other

## 2017-01-10 ENCOUNTER — Telehealth: Payer: Self-pay | Admitting: Pharmacist

## 2017-01-10 VITALS — BP 141/99 | HR 78 | Temp 98.2°F | Resp 20

## 2017-01-10 DIAGNOSIS — C7951 Secondary malignant neoplasm of bone: Secondary | ICD-10-CM | POA: Diagnosis not present

## 2017-01-10 DIAGNOSIS — Z170 Estrogen receptor positive status [ER+]: Principal | ICD-10-CM

## 2017-01-10 DIAGNOSIS — C50511 Malignant neoplasm of lower-outer quadrant of right female breast: Secondary | ICD-10-CM

## 2017-01-10 DIAGNOSIS — Z5111 Encounter for antineoplastic chemotherapy: Secondary | ICD-10-CM | POA: Diagnosis not present

## 2017-01-10 LAB — CBC WITH DIFFERENTIAL/PLATELET
BASO%: 2.5 % — ABNORMAL HIGH (ref 0.0–2.0)
BASOS ABS: 0.1 10*3/uL (ref 0.0–0.1)
EOS%: 1.1 % (ref 0.0–7.0)
Eosinophils Absolute: 0 10*3/uL (ref 0.0–0.5)
HEMATOCRIT: 36 % (ref 34.8–46.6)
HEMOGLOBIN: 12.6 g/dL (ref 11.6–15.9)
LYMPH#: 1.1 10*3/uL (ref 0.9–3.3)
LYMPH%: 48.5 % (ref 14.0–49.7)
MCH: 43.4 pg — AB (ref 25.1–34.0)
MCHC: 35.1 g/dL (ref 31.5–36.0)
MCV: 123.7 fL — AB (ref 79.5–101.0)
MONO#: 0.3 10*3/uL (ref 0.1–0.9)
MONO%: 11.3 % (ref 0.0–14.0)
NEUT#: 0.8 10*3/uL — ABNORMAL LOW (ref 1.5–6.5)
NEUT%: 36.6 % — AB (ref 38.4–76.8)
Platelets: 102 10*3/uL — ABNORMAL LOW (ref 145–400)
RBC: 2.91 10*6/uL — ABNORMAL LOW (ref 3.70–5.45)
RDW: 13.7 % (ref 11.2–14.5)
WBC: 2.3 10*3/uL — ABNORMAL LOW (ref 3.9–10.3)

## 2017-01-10 LAB — COMPREHENSIVE METABOLIC PANEL
ALBUMIN: 3.6 g/dL (ref 3.5–5.0)
ALK PHOS: 90 U/L (ref 40–150)
ALT: 68 U/L — ABNORMAL HIGH (ref 0–55)
AST: 89 U/L — AB (ref 5–34)
Anion Gap: 8 mEq/L (ref 3–11)
BUN: 7.4 mg/dL (ref 7.0–26.0)
CALCIUM: 8.9 mg/dL (ref 8.4–10.4)
CHLORIDE: 104 meq/L (ref 98–109)
CO2: 29 mEq/L (ref 22–29)
Creatinine: 0.7 mg/dL (ref 0.6–1.1)
EGFR: 90 mL/min/{1.73_m2} — AB (ref >90)
Glucose: 103 mg/dl (ref 70–140)
POTASSIUM: 4 meq/L (ref 3.5–5.1)
Sodium: 140 mEq/L (ref 136–145)
Total Bilirubin: 0.45 mg/dL (ref 0.20–1.20)
Total Protein: 6.9 g/dL (ref 6.4–8.3)

## 2017-01-10 MED ORDER — FULVESTRANT 250 MG/5ML IM SOLN
500.0000 mg | Freq: Once | INTRAMUSCULAR | Status: AC
  Administered 2017-01-10: 500 mg via INTRAMUSCULAR
  Filled 2017-01-10: qty 10

## 2017-01-10 MED ORDER — DENOSUMAB 120 MG/1.7ML ~~LOC~~ SOLN
120.0000 mg | Freq: Once | SUBCUTANEOUS | Status: AC
  Administered 2017-01-10: 120 mg via SUBCUTANEOUS
  Filled 2017-01-10: qty 1.7

## 2017-01-10 NOTE — Telephone Encounter (Signed)
Oral Chemotherapy Pharmacist Encounter  I met in lobby today to complete application for [place] to try to obtain the medication from the manufacturer.  Completed application faxed to [employer] at [phone]  This encounter will continue to be updated until final determination.  Oral Oncology Clinic will continue to follow.   Johny Drilling, PharmD, BCPS, BCOP 01/10/2017  1:18 PM Oral Oncology Clinic [phone]

## 2017-01-10 NOTE — Patient Instructions (Signed)
Fulvestrant injection What is this medicine? FULVESTRANT (ful VES trant) blocks the effects of estrogen. It is used to treat breast cancer. This medicine may be used for other purposes; ask your health care provider or pharmacist if you have questions. What should I tell my health care provider before I take this medicine? They need to know if you have any of these conditions: -bleeding problems -liver disease -low levels of platelets in the blood -an unusual or allergic reaction to fulvestrant, other medicines, foods, dyes, or preservatives -pregnant or trying to get pregnant -breast-feeding How should I use this medicine? This medicine is for injection into a muscle. It is usually given by a health care professional in a hospital or clinic setting. Talk to your pediatrician regarding the use of this medicine in children. Special care may be needed. Overdosage: If you think you have taken too much of this medicine contact a poison control center or emergency room at once. NOTE: This medicine is only for you. Do not share this medicine with others. What if I miss a dose? It is important not to miss your dose. Call your doctor or health care professional if you are unable to keep an appointment. What may interact with this medicine? -medicines that treat or prevent blood clots like warfarin, enoxaparin, and dalteparin This list may not describe all possible interactions. Give your health care provider a list of all the medicines, herbs, non-prescription drugs, or dietary supplements you use. Also tell them if you smoke, drink alcohol, or use illegal drugs. Some items may interact with your medicine. What should I watch for while using this medicine? Your condition will be monitored carefully while you are receiving this medicine. You will need important blood work done while you are taking this medicine. Do not become pregnant while taking this medicine or for at least 1 year after stopping  it. Women of child-bearing potential will need to have a negative pregnancy test before starting this medicine. Women should inform their doctor if they wish to become pregnant or think they might be pregnant. There is a potential for serious side effects to an unborn child. Men should inform their doctors if they wish to father a child. This medicine may lower sperm counts. Talk to your health care professional or pharmacist for more information. Do not breast-feed an infant while taking this medicine or for 1 year after the last dose. What side effects may I notice from receiving this medicine? Side effects that you should report to your doctor or health care professional as soon as possible: -allergic reactions like skin rash, itching or hives, swelling of the face, lips, or tongue -feeling faint or lightheaded, falls -pain, tingling, numbness, or weakness in the legs -signs and symptoms of infection like fever or chills; cough; flu-like symptoms; sore throat -vaginal bleeding Side effects that usually do not require medical attention (report to your doctor or health care professional if they continue or are bothersome): -aches, pains -constipation -diarrhea -headache -hot flashes -nausea, vomiting -pain at site where injected -stomach pain This list may not describe all possible side effects. Call your doctor for medical advice about side effects. You may report side effects to FDA at 1-800-FDA-1088. Where should I keep my medicine? This drug is given in a hospital or clinic and will not be stored at home. NOTE: This sheet is a summary. It may not cover all possible information. If you have questions about this medicine, talk to your doctor, pharmacist, or health   care provider.    2016, Elsevier/Gold Standard. (2015-02-25 11:03:55)  Denosumab injection What is this medicine? DENOSUMAB (den oh sue mab) slows bone breakdown. Prolia is used to treat osteoporosis in women after menopause  and in men. Xgeva is used to prevent bone fractures and other bone problems caused by cancer bone metastases. Xgeva is also used to treat giant cell tumor of the bone. This medicine may be used for other purposes; ask your health care provider or pharmacist if you have questions. What should I tell my health care provider before I take this medicine? They need to know if you have any of these conditions: -dental disease -eczema -infection or history of infections -kidney disease or on dialysis -low blood calcium or vitamin D -malabsorption syndrome -scheduled to have surgery or tooth extraction -taking medicine that contains denosumab -thyroid or parathyroid disease -an unusual reaction to denosumab, other medicines, foods, dyes, or preservatives -pregnant or trying to get pregnant -breast-feeding How should I use this medicine? This medicine is for injection under the skin. It is given by a health care professional in a hospital or clinic setting. If you are getting Prolia, a special MedGuide will be given to you by the pharmacist with each prescription and refill. Be sure to read this information carefully each time. For Prolia, talk to your pediatrician regarding the use of this medicine in children. Special care may be needed. For Xgeva, talk to your pediatrician regarding the use of this medicine in children. While this drug may be prescribed for children as young as 13 years for selected conditions, precautions do apply. Overdosage: If you think you have taken too much of this medicine contact a poison control center or emergency room at once. NOTE: This medicine is only for you. Do not share this medicine with others. What if I miss a dose? It is important not to miss your dose. Call your doctor or health care professional if you are unable to keep an appointment. What may interact with this medicine? Do not take this medicine with any of the following medications: -other medicines  containing denosumab This medicine may also interact with the following medications: -medicines that suppress the immune system -medicines that treat cancer -steroid medicines like prednisone or cortisone This list may not describe all possible interactions. Give your health care provider a list of all the medicines, herbs, non-prescription drugs, or dietary supplements you use. Also tell them if you smoke, drink alcohol, or use illegal drugs. Some items may interact with your medicine. What should I watch for while using this medicine? Visit your doctor or health care professional for regular checks on your progress. Your doctor or health care professional may order blood tests and other tests to see how you are doing. Call your doctor or health care professional if you get a cold or other infection while receiving this medicine. Do not treat yourself. This medicine may decrease your body's ability to fight infection. You should make sure you get enough calcium and vitamin D while you are taking this medicine, unless your doctor tells you not to. Discuss the foods you eat and the vitamins you take with your health care professional. See your dentist regularly. Brush and floss your teeth as directed. Before you have any dental work done, tell your dentist you are receiving this medicine. Do not become pregnant while taking this medicine or for 5 months after stopping it. Women should inform their doctor if they wish to become pregnant or think   they might be pregnant. There is a potential for serious side effects to an unborn child. Talk to your health care professional or pharmacist for more information. What side effects may I notice from receiving this medicine? Side effects that you should report to your doctor or health care professional as soon as possible: -allergic reactions like skin rash, itching or hives, swelling of the face, lips, or tongue -breathing problems -chest pain -fast,  irregular heartbeat -feeling faint or lightheaded, falls -fever, chills, or any other sign of infection -muscle spasms, tightening, or twitches -numbness or tingling -skin blisters or bumps, or is dry, peels, or red -slow healing or unexplained pain in the mouth or jaw -unusual bleeding or bruising Side effects that usually do not require medical attention (Report these to your doctor or health care professional if they continue or are bothersome.): -muscle pain -stomach upset, gas This list may not describe all possible side effects. Call your doctor for medical advice about side effects. You may report side effects to FDA at 1-800-FDA-1088. Where should I keep my medicine? This medicine is only given in a clinic, doctor's office, or other health care setting and will not be stored at home. NOTE: This sheet is a summary. It may not cover all possible information. If you have questions about this medicine, talk to your doctor, pharmacist, or health care provider.    2016, Elsevier/Gold Standard. (2012-01-28 12:37:47)   

## 2017-01-11 ENCOUNTER — Ambulatory Visit (INDEPENDENT_AMBULATORY_CARE_PROVIDER_SITE_OTHER): Payer: Medicare Other

## 2017-01-11 ENCOUNTER — Other Ambulatory Visit: Payer: Self-pay

## 2017-01-11 DIAGNOSIS — R0600 Dyspnea, unspecified: Secondary | ICD-10-CM

## 2017-01-11 LAB — ECHOCARDIOGRAM COMPLETE
AO mean calculated velocity dopler: 93.1 cm/s
AOASC: 35 cm
AV Area VTI index: 1.49 cm2/m2
AV Area mean vel: 2.36 cm2
AV Mean grad: 4 mmHg
AV VEL mean LVOT/AV: 0.62
AV area mean vel ind: 1.25 cm2/m2
AV vel: 2.8
AVA: 2.8 cm2
CHL CUP AV VALUE AREA INDEX: 1.49
CHL CUP DOP CALC LVOT VTI: 18.6 cm
E decel time: 197 msec
E/e' ratio: 11.24
FS: 56 % — AB (ref 28–44)
IVS/LV PW RATIO, ED: 1.18
LA diam end sys: 35 mm
LA diam index: 1.86 cm/m2
LASIZE: 35 mm
LV E/e' medial: 11.24
LV PW d: 11 mm — AB (ref 0.6–1.1)
LV TDI E'LATERAL: 5.87
LVEEAVG: 11.24
LVELAT: 5.87 cm/s
LVOT SV: 71 mL
LVOT area: 3.8 cm2
LVOT peak VTI: 0.74 cm
LVOTD: 22 mm
Lateral S' vel: 16.1 cm/s
MV Dec: 197
MV pk A vel: 89.7 m/s
MVAP: 3.79 cm2
MVPKEVEL: 66 m/s
MVSPHT: 58 ms
TAPSE: 18.5 mm
TDI e' medial: 5.87
VTI: 25.2 cm

## 2017-01-15 ENCOUNTER — Other Ambulatory Visit: Payer: Self-pay | Admitting: Physician Assistant

## 2017-01-15 MED FILL — BISOPROLOL FUMARATE 5 MG TA: 5 | 90 days supply | Qty: 90 | Fill #0

## 2017-01-15 MED FILL — LORazepam 2 MG TABS: 2 | 30 days supply | Qty: 30 | Fill #0

## 2017-01-16 ENCOUNTER — Other Ambulatory Visit: Payer: Self-pay | Admitting: Hematology and Oncology

## 2017-01-16 MED FILL — PROAIR HFA 90 MCG INHALER: 108 (90 BAS | 25 days supply | Qty: 9 | Fill #0

## 2017-02-06 ENCOUNTER — Other Ambulatory Visit: Payer: Self-pay | Admitting: Physician Assistant

## 2017-02-06 ENCOUNTER — Other Ambulatory Visit: Payer: Self-pay | Admitting: Hematology and Oncology

## 2017-02-06 MED FILL — PROAIR HFA 90 MCG INHALER: 108 (90 BAS | 25 days supply | Qty: 9 | Fill #0

## 2017-02-06 MED FILL — PROMETHAZINE 25 MG TABLET: 25 | 7 days supply | Qty: 30 | Fill #2

## 2017-02-07 ENCOUNTER — Ambulatory Visit (HOSPITAL_BASED_OUTPATIENT_CLINIC_OR_DEPARTMENT_OTHER): Payer: Medicare Other | Admitting: Hematology and Oncology

## 2017-02-07 ENCOUNTER — Encounter: Payer: Self-pay | Admitting: Hematology and Oncology

## 2017-02-07 ENCOUNTER — Other Ambulatory Visit (HOSPITAL_BASED_OUTPATIENT_CLINIC_OR_DEPARTMENT_OTHER): Payer: Medicare Other

## 2017-02-07 ENCOUNTER — Ambulatory Visit (HOSPITAL_BASED_OUTPATIENT_CLINIC_OR_DEPARTMENT_OTHER): Payer: Medicare Other

## 2017-02-07 VITALS — BP 103/66 | HR 73 | Temp 98.1°F | Resp 18 | Ht 65.0 in | Wt 179.6 lb

## 2017-02-07 DIAGNOSIS — C50511 Malignant neoplasm of lower-outer quadrant of right female breast: Secondary | ICD-10-CM

## 2017-02-07 DIAGNOSIS — C7951 Secondary malignant neoplasm of bone: Secondary | ICD-10-CM | POA: Diagnosis not present

## 2017-02-07 DIAGNOSIS — Z5111 Encounter for antineoplastic chemotherapy: Secondary | ICD-10-CM | POA: Diagnosis not present

## 2017-02-07 DIAGNOSIS — C787 Secondary malignant neoplasm of liver and intrahepatic bile duct: Secondary | ICD-10-CM | POA: Diagnosis not present

## 2017-02-07 DIAGNOSIS — Z170 Estrogen receptor positive status [ER+]: Principal | ICD-10-CM

## 2017-02-07 DIAGNOSIS — C7800 Secondary malignant neoplasm of unspecified lung: Secondary | ICD-10-CM

## 2017-02-07 LAB — CBC WITH DIFFERENTIAL/PLATELET
BASO%: 1.1 % (ref 0.0–2.0)
BASOS ABS: 0 10*3/uL (ref 0.0–0.1)
EOS%: 0.7 % (ref 0.0–7.0)
Eosinophils Absolute: 0 10*3/uL (ref 0.0–0.5)
HCT: 40.1 % (ref 34.8–46.6)
HEMOGLOBIN: 14 g/dL (ref 11.6–15.9)
LYMPH#: 1.1 10*3/uL (ref 0.9–3.3)
LYMPH%: 44.4 % (ref 14.0–49.7)
MCH: 43.4 pg — AB (ref 25.1–34.0)
MCHC: 35 g/dL (ref 31.5–36.0)
MCV: 124.2 fL — ABNORMAL HIGH (ref 79.5–101.0)
MONO#: 0.2 10*3/uL (ref 0.1–0.9)
MONO%: 8.9 % (ref 0.0–14.0)
NEUT#: 1.1 10*3/uL — ABNORMAL LOW (ref 1.5–6.5)
NEUT%: 44.9 % (ref 38.4–76.8)
Platelets: 90 10*3/uL — ABNORMAL LOW (ref 145–400)
RBC: 3.23 10*6/uL — ABNORMAL LOW (ref 3.70–5.45)
RDW: 14 % (ref 11.2–14.5)
WBC: 2.5 10*3/uL — AB (ref 3.9–10.3)

## 2017-02-07 LAB — COMPREHENSIVE METABOLIC PANEL
ALBUMIN: 3.6 g/dL (ref 3.5–5.0)
ALK PHOS: 67 U/L (ref 40–150)
ALT: 54 U/L (ref 0–55)
AST: 61 U/L — AB (ref 5–34)
Anion Gap: 9 mEq/L (ref 3–11)
BUN: 10.2 mg/dL (ref 7.0–26.0)
CHLORIDE: 99 meq/L (ref 98–109)
CO2: 29 mEq/L (ref 22–29)
CREATININE: 0.8 mg/dL (ref 0.6–1.1)
Calcium: 9.4 mg/dL (ref 8.4–10.4)
EGFR: 78 mL/min/{1.73_m2} — ABNORMAL LOW (ref >90)
GLUCOSE: 120 mg/dL (ref 70–140)
POTASSIUM: 3.8 meq/L (ref 3.5–5.1)
SODIUM: 137 meq/L (ref 136–145)
Total Bilirubin: 1.38 mg/dL — ABNORMAL HIGH (ref 0.20–1.20)
Total Protein: 7.3 g/dL (ref 6.4–8.3)

## 2017-02-07 MED ORDER — DENOSUMAB 120 MG/1.7ML ~~LOC~~ SOLN
120.0000 mg | Freq: Once | SUBCUTANEOUS | Status: AC
  Administered 2017-02-07: 120 mg via SUBCUTANEOUS
  Filled 2017-02-07: qty 1.7

## 2017-02-07 MED ORDER — FULVESTRANT 250 MG/5ML IM SOLN
500.0000 mg | Freq: Once | INTRAMUSCULAR | Status: AC
  Administered 2017-02-07: 500 mg via INTRAMUSCULAR
  Filled 2017-02-07: qty 10

## 2017-02-07 MED ORDER — LORAZEPAM 2 MG PO TABS: ORAL_TABLET | ORAL | 3 refills | Status: DC

## 2017-02-07 MED FILL — IBRANCE 125 MG CAPSULE: 125 | 21 days supply | Qty: 21 | Fill #4
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
## 2017-02-07 NOTE — Telephone Encounter (Signed)
Oral Chemotherapy Pharmacist Encounter  Received notification from [place] [employer] that patient has been successfully enrolled into their program to receive Ibrance at $0 out of pocket cost from the manufacturer until 08/12/17.  Patient notified in person in [facility] lobby prior to MD appointment. Her 1st shipment is already on it's way.  Patient doing well. She has missed 0 doses of her Ibrance 125mg  capsule once daily for 21 days on and 7 days off. Patient is experiencing some fatigue which is manageable. She feels the best on her weeks off. She is agreeable to continued Ibrance treatment.  Oral Oncology Clinic will continue to follow.  Johny Drilling, PharmD, BCPS, BCOP 02/07/2017  3:16 PM Oral Oncology Clinic [phone]

## 2017-03-01 MED FILL — IBRANCE 125 MG CAPSULE: 125 | 21 days supply | Qty: 21 | Fill #5

## 2017-03-05 MED FILL — LETROZOLE 2.5 MG TABLET: 2.5 | 90 days supply | Qty: 90 | Fill #0

## 2017-03-05 MED FILL — PROMETHAZINE 25 MG TABLET: 25 | 7 days supply | Qty: 30 | Fill #3

## 2017-03-06 ENCOUNTER — Other Ambulatory Visit: Payer: Self-pay | Admitting: Hematology and Oncology

## 2017-03-06 MED FILL — PROAIR HFA 90 MCG INHALER: 108 (90 BAS | 25 days supply | Qty: 9 | Fill #0

## 2017-03-06 MED FILL — ALBUTEROL 0.083% INHAL SOLN: (2.5 MG/3ML | 7 days supply | Qty: 90 | Fill #1

## 2017-03-07 ENCOUNTER — Ambulatory Visit (HOSPITAL_BASED_OUTPATIENT_CLINIC_OR_DEPARTMENT_OTHER): Payer: Medicare Other

## 2017-03-07 ENCOUNTER — Other Ambulatory Visit (HOSPITAL_BASED_OUTPATIENT_CLINIC_OR_DEPARTMENT_OTHER): Payer: Medicare Other

## 2017-03-07 DIAGNOSIS — Z170 Estrogen receptor positive status [ER+]: Principal | ICD-10-CM

## 2017-03-07 DIAGNOSIS — C50511 Malignant neoplasm of lower-outer quadrant of right female breast: Secondary | ICD-10-CM | POA: Diagnosis not present

## 2017-03-07 DIAGNOSIS — C7951 Secondary malignant neoplasm of bone: Secondary | ICD-10-CM

## 2017-03-07 DIAGNOSIS — Z5111 Encounter for antineoplastic chemotherapy: Secondary | ICD-10-CM | POA: Diagnosis not present

## 2017-03-07 LAB — CBC WITH DIFFERENTIAL/PLATELET
BASO%: 1.5 % (ref 0.0–2.0)
BASOS ABS: 0 10*3/uL (ref 0.0–0.1)
EOS%: 1.1 % (ref 0.0–7.0)
Eosinophils Absolute: 0 10*3/uL (ref 0.0–0.5)
HCT: 35.9 % (ref 34.8–46.6)
HEMOGLOBIN: 12.7 g/dL (ref 11.6–15.9)
LYMPH#: 1.3 10*3/uL (ref 0.9–3.3)
LYMPH%: 47.9 % (ref 14.0–49.7)
MCH: 43.1 pg — AB (ref 25.1–34.0)
MCHC: 35.4 g/dL (ref 31.5–36.0)
MCV: 121.7 fL — ABNORMAL HIGH (ref 79.5–101.0)
MONO#: 0.2 10*3/uL (ref 0.1–0.9)
MONO%: 8 % (ref 0.0–14.0)
NEUT#: 1.1 10*3/uL — ABNORMAL LOW (ref 1.5–6.5)
NEUT%: 41.5 % (ref 38.4–76.8)
Platelets: 94 10*3/uL — ABNORMAL LOW (ref 145–400)
RBC: 2.95 10*6/uL — ABNORMAL LOW (ref 3.70–5.45)
RDW: 13 % (ref 11.2–14.5)
WBC: 2.6 10*3/uL — ABNORMAL LOW (ref 3.9–10.3)

## 2017-03-07 LAB — COMPREHENSIVE METABOLIC PANEL
ALT: 72 U/L — AB (ref 0–55)
AST: 98 U/L — AB (ref 5–34)
Albumin: 3.3 g/dL — ABNORMAL LOW (ref 3.5–5.0)
Alkaline Phosphatase: 86 U/L (ref 40–150)
Anion Gap: 8 mEq/L (ref 3–11)
BUN: 6.6 mg/dL — AB (ref 7.0–26.0)
CHLORIDE: 104 meq/L (ref 98–109)
CO2: 31 mEq/L — ABNORMAL HIGH (ref 22–29)
CREATININE: 0.8 mg/dL (ref 0.6–1.1)
Calcium: 9.2 mg/dL (ref 8.4–10.4)
EGFR: 82 mL/min/{1.73_m2} — ABNORMAL LOW (ref >90)
GLUCOSE: 113 mg/dL (ref 70–140)
POTASSIUM: 4 meq/L (ref 3.5–5.1)
SODIUM: 143 meq/L (ref 136–145)
Total Bilirubin: 0.37 mg/dL (ref 0.20–1.20)
Total Protein: 6.8 g/dL (ref 6.4–8.3)

## 2017-03-07 MED ORDER — DENOSUMAB 120 MG/1.7ML ~~LOC~~ SOLN
120.0000 mg | Freq: Once | SUBCUTANEOUS | Status: AC
  Administered 2017-03-07: 120 mg via SUBCUTANEOUS
  Filled 2017-03-07: qty 1.7

## 2017-03-07 MED ORDER — FULVESTRANT 250 MG/5ML IM SOLN
500.0000 mg | Freq: Once | INTRAMUSCULAR | Status: AC
  Administered 2017-03-07: 500 mg via INTRAMUSCULAR
  Filled 2017-03-07: qty 10

## 2017-03-07 NOTE — Patient Instructions (Signed)
Fulvestrant injection What is this medicine? FULVESTRANT (ful VES trant) blocks the effects of estrogen. It is used to treat breast cancer. This medicine may be used for other purposes; ask your health care provider or pharmacist if you have questions. COMMON BRAND NAME(S): FASLODEX What should I tell my health care provider before I take this medicine? They need to know if you have any of these conditions: -bleeding problems -liver disease -low levels of platelets in the blood -an unusual or allergic reaction to fulvestrant, other medicines, foods, dyes, or preservatives -pregnant or trying to get pregnant -breast-feeding How should I use this medicine? This medicine is for injection into a muscle. It is usually given by a health care professional in a hospital or clinic setting. Talk to your pediatrician regarding the use of this medicine in children. Special care may be needed. Overdosage: If you think you have taken too much of this medicine contact a poison control center or emergency room at once. NOTE: This medicine is only for you. Do not share this medicine with others. What if I miss a dose? It is important not to miss your dose. Call your doctor or health care professional if you are unable to keep an appointment. What may interact with this medicine? -medicines that treat or prevent blood clots like warfarin, enoxaparin, and dalteparin This list may not describe all possible interactions. Give your health care provider a list of all the medicines, herbs, non-prescription drugs, or dietary supplements you use. Also tell them if you smoke, drink alcohol, or use illegal drugs. Some items may interact with your medicine. What should I watch for while using this medicine? Your condition will be monitored carefully while you are receiving this medicine. You will need important blood work done while you are taking this medicine. Do not become pregnant while taking this medicine or for  at least 1 year after stopping it. Women of child-bearing potential will need to have a negative pregnancy test before starting this medicine. Women should inform their doctor if they wish to become pregnant or think they might be pregnant. There is a potential for serious side effects to an unborn child. Men should inform their doctors if they wish to father a child. This medicine may lower sperm counts. Talk to your health care professional or pharmacist for more information. Do not breast-feed an infant while taking this medicine or for 1 year after the last dose. What side effects may I notice from receiving this medicine? Side effects that you should report to your doctor or health care professional as soon as possible: -allergic reactions like skin rash, itching or hives, swelling of the face, lips, or tongue -feeling faint or lightheaded, falls -pain, tingling, numbness, or weakness in the legs -signs and symptoms of infection like fever or chills; cough; flu-like symptoms; sore throat -vaginal bleeding Side effects that usually do not require medical attention (report to your doctor or health care professional if they continue or are bothersome): -aches, pains -constipation -diarrhea -headache -hot flashes -nausea, vomiting -pain at site where injected -stomach pain This list may not describe all possible side effects. Call your doctor for medical advice about side effects. You may report side effects to FDA at 1-800-FDA-1088. Where should I keep my medicine? This drug is given in a hospital or clinic and will not be stored at home. NOTE: This sheet is a summary. It may not cover all possible information. If you have questions about this medicine, talk to your  doctor, pharmacist, or health care provider.  2018 Elsevier/Gold Standard (2015-02-25 11:03:55)   Denosumab injection What is this medicine? DENOSUMAB (den oh sue mab) slows bone breakdown. Prolia is used to treat  osteoporosis in women after menopause and in men. Xgeva is used to treat a high calcium level due to cancer and to prevent bone fractures and other bone problems caused by multiple myeloma or cancer bone metastases. Xgeva is also used to treat giant cell tumor of the bone. This medicine may be used for other purposes; ask your health care provider or pharmacist if you have questions. COMMON BRAND NAME(S): Prolia, XGEVA What should I tell my health care provider before I take this medicine? They need to know if you have any of these conditions: -dental disease -having surgery or tooth extraction -infection -kidney disease -low levels of calcium or Vitamin D in the blood -malnutrition -on hemodialysis -skin conditions or sensitivity -thyroid or parathyroid disease -an unusual reaction to denosumab, other medicines, foods, dyes, or preservatives -pregnant or trying to get pregnant -breast-feeding How should I use this medicine? This medicine is for injection under the skin. It is given by a health care professional in a hospital or clinic setting. If you are getting Prolia, a special MedGuide will be given to you by the pharmacist with each prescription and refill. Be sure to read this information carefully each time. For Prolia, talk to your pediatrician regarding the use of this medicine in children. Special care may be needed. For Xgeva, talk to your pediatrician regarding the use of this medicine in children. While this drug may be prescribed for children as young as 13 years for selected conditions, precautions do apply. Overdosage: If you think you have taken too much of this medicine contact a poison control center or emergency room at once. NOTE: This medicine is only for you. Do not share this medicine with others. What if I miss a dose? It is important not to miss your dose. Call your doctor or health care professional if you are unable to keep an appointment. What may interact with  this medicine? Do not take this medicine with any of the following medications: -other medicines containing denosumab This medicine may also interact with the following medications: -medicines that lower your chance of fighting infection -steroid medicines like prednisone or cortisone This list may not describe all possible interactions. Give your health care provider a list of all the medicines, herbs, non-prescription drugs, or dietary supplements you use. Also tell them if you smoke, drink alcohol, or use illegal drugs. Some items may interact with your medicine. What should I watch for while using this medicine? Visit your doctor or health care professional for regular checks on your progress. Your doctor or health care professional may order blood tests and other tests to see how you are doing. Call your doctor or health care professional for advice if you get a fever, chills or sore throat, or other symptoms of a cold or flu. Do not treat yourself. This drug may decrease your body's ability to fight infection. Try to avoid being around people who are sick. You should make sure you get enough calcium and vitamin D while you are taking this medicine, unless your doctor tells you not to. Discuss the foods you eat and the vitamins you take with your health care professional. See your dentist regularly. Brush and floss your teeth as directed. Before you have any dental work done, tell your dentist you are receiving this   medicine. Do not become pregnant while taking this medicine or for 5 months after stopping it. Talk with your doctor or health care professional about your birth control options while taking this medicine. Women should inform their doctor if they wish to become pregnant or think they might be pregnant. There is a potential for serious side effects to an unborn child. Talk to your health care professional or pharmacist for more information. What side effects may I notice from receiving  this medicine? Side effects that you should report to your doctor or health care professional as soon as possible: -allergic reactions like skin rash, itching or hives, swelling of the face, lips, or tongue -bone pain -breathing problems -dizziness -jaw pain, especially after dental work -redness, blistering, peeling of the skin -signs and symptoms of infection like fever or chills; cough; sore throat; pain or trouble passing urine -signs of low calcium like fast heartbeat, muscle cramps or muscle pain; pain, tingling, numbness in the hands or feet; seizures -unusual bleeding or bruising -unusually weak or tired Side effects that usually do not require medical attention (report to your doctor or health care professional if they continue or are bothersome): -constipation -diarrhea -headache -joint pain -loss of appetite -muscle pain -runny nose -tiredness -upset stomach This list may not describe all possible side effects. Call your doctor for medical advice about side effects. You may report side effects to FDA at 1-800-FDA-1088. Where should I keep my medicine? This medicine is only given in a clinic, doctor's office, or other health care setting and will not be stored at home. NOTE: This sheet is a summary. It may not cover all possible information. If you have questions about this medicine, talk to your doctor, pharmacist, or health care provider.  2018 Elsevier/Gold Standard (2016-08-21 19:17:21)

## 2017-04-03 ENCOUNTER — Other Ambulatory Visit: Payer: Self-pay | Admitting: Hematology and Oncology

## 2017-04-03 MED FILL — PROAIR HFA 90 MCG INHALER: 108 (90 BAS | 25 days supply | Qty: 9 | Fill #0

## 2017-04-03 MED FILL — PROMETHAZINE 25 MG TABLET: 25 | 7 days supply | Qty: 30 | Fill #0

## 2017-04-03 MED FILL — ALBUTEROL 0.083% INHAL SOLN: (2.5 MG/3ML | 7 days supply | Qty: 90 | Fill #2

## 2017-04-03 MED FILL — IBRANCE 125 MG CAPSULE: 125 | 21 days supply | Qty: 21 | Fill #6

## 2017-04-04 ENCOUNTER — Other Ambulatory Visit (HOSPITAL_BASED_OUTPATIENT_CLINIC_OR_DEPARTMENT_OTHER): Payer: Medicare Other

## 2017-04-04 ENCOUNTER — Ambulatory Visit (HOSPITAL_BASED_OUTPATIENT_CLINIC_OR_DEPARTMENT_OTHER): Payer: Medicare Other

## 2017-04-04 VITALS — BP 113/80 | HR 78 | Temp 98.3°F | Resp 20

## 2017-04-04 DIAGNOSIS — C50511 Malignant neoplasm of lower-outer quadrant of right female breast: Secondary | ICD-10-CM

## 2017-04-04 DIAGNOSIS — C7951 Secondary malignant neoplasm of bone: Secondary | ICD-10-CM

## 2017-04-04 DIAGNOSIS — Z170 Estrogen receptor positive status [ER+]: Principal | ICD-10-CM

## 2017-04-04 DIAGNOSIS — Z5111 Encounter for antineoplastic chemotherapy: Secondary | ICD-10-CM

## 2017-04-04 LAB — CBC WITH DIFFERENTIAL/PLATELET
BASO%: 1.1 % (ref 0.0–2.0)
Basophils Absolute: 0 10*3/uL (ref 0.0–0.1)
EOS ABS: 0 10*3/uL (ref 0.0–0.5)
EOS%: 0.9 % (ref 0.0–7.0)
HCT: 39 % (ref 34.8–46.6)
HEMOGLOBIN: 13.7 g/dL (ref 11.6–15.9)
LYMPH#: 0.9 10*3/uL (ref 0.9–3.3)
LYMPH%: 27.7 % (ref 14.0–49.7)
MCH: 44.1 pg — ABNORMAL HIGH (ref 25.1–34.0)
MCHC: 35.1 g/dL (ref 31.5–36.0)
MCV: 125.4 fL — AB (ref 79.5–101.0)
MONO#: 0.3 10*3/uL (ref 0.1–0.9)
MONO%: 7.6 % (ref 0.0–14.0)
NEUT%: 62.7 % (ref 38.4–76.8)
NEUTROS ABS: 2.1 10*3/uL (ref 1.5–6.5)
PLATELETS: 151 10*3/uL (ref 145–400)
RBC: 3.11 10*6/uL — ABNORMAL LOW (ref 3.70–5.45)
RDW: 13.8 % (ref 11.2–14.5)
WBC: 3.4 10*3/uL — AB (ref 3.9–10.3)

## 2017-04-04 LAB — COMPREHENSIVE METABOLIC PANEL
ALBUMIN: 3.3 g/dL — AB (ref 3.5–5.0)
ALK PHOS: 81 U/L (ref 40–150)
ALT: 61 U/L — AB (ref 0–55)
ANION GAP: 8 meq/L (ref 3–11)
AST: 91 U/L — ABNORMAL HIGH (ref 5–34)
BILIRUBIN TOTAL: 1.35 mg/dL — AB (ref 0.20–1.20)
BUN: 8.3 mg/dL (ref 7.0–26.0)
CO2: 30 mEq/L — ABNORMAL HIGH (ref 22–29)
CREATININE: 0.9 mg/dL (ref 0.6–1.1)
Calcium: 9.3 mg/dL (ref 8.4–10.4)
Chloride: 99 mEq/L (ref 98–109)
EGFR: 74 mL/min/{1.73_m2} — AB (ref >90)
GLUCOSE: 145 mg/dL — AB (ref 70–140)
Potassium: 3.8 mEq/L (ref 3.5–5.1)
Sodium: 137 mEq/L (ref 136–145)
TOTAL PROTEIN: 7.2 g/dL (ref 6.4–8.3)

## 2017-04-04 MED ORDER — FULVESTRANT 250 MG/5ML IM SOLN
500.0000 mg | Freq: Once | INTRAMUSCULAR | Status: AC
  Administered 2017-04-04: 500 mg via INTRAMUSCULAR
  Filled 2017-04-04: qty 10

## 2017-04-04 MED ORDER — DENOSUMAB 120 MG/1.7ML ~~LOC~~ SOLN
120.0000 mg | Freq: Once | SUBCUTANEOUS | Status: AC
  Administered 2017-04-04: 120 mg via SUBCUTANEOUS
  Filled 2017-04-04: qty 1.7

## 2017-04-04 MED FILL — BISOPROLOL FUMARATE 5 MG TA: 5 | 90 days supply | Qty: 90 | Fill #1

## 2017-05-01 ENCOUNTER — Ambulatory Visit: Admission: RE | Admit: 2017-05-01 | Payer: Medicare Other | Source: Ambulatory Visit

## 2017-05-01 ENCOUNTER — Telehealth: Payer: Self-pay

## 2017-05-01 NOTE — Telephone Encounter (Signed)
Lea at [facility] imaging called that pt showed up for PET at [facility] and it was scheduled at [facility]. Lea can get pt scheduled and prior auth for [facility] tomorrow at 1130. This is in the middle of her scheduled appts at [facility]. This RN instructed Lea to keep the new PET schedule and we will change the [facility] schedule and call pt.   Awaiting input from Dr Lindi Adie before inbasket sent with schedule change.

## 2017-05-01 NOTE — Telephone Encounter (Signed)
inbasket sent

## 2017-05-01 NOTE — Telephone Encounter (Signed)
Spoke with patient concerning the schedule changes. Per RN request patient appointment for 9/20 was rescheduled to 9/21 ( Per 9/19 los)

## 2017-05-01 NOTE — Telephone Encounter (Signed)
Called pt to let her know that her appt with Dr.Gudena tomorrow to discuss scan results will need to be rescheduled due to new appt for PET scan. Pt was unaware that her original pet scan appt was located at [facility]. PT r/s for pet scan WL tomorrow. Sent message to scheduling to have pt come in on Friday instead to have lab/md and inj. Pt verbalized understanding and appreciative of call. Told pt that she will be receiving a phone call to confirm new time/date on Friday.

## 2017-05-01 NOTE — Telephone Encounter (Signed)
Please reschedule pt for the next day to see Dr.Gudena to discuss results.

## 2017-05-02 ENCOUNTER — Ambulatory Visit (HOSPITAL_COMMUNITY)
Admission: RE | Admit: 2017-05-02 | Discharge: 2017-05-02 | Disposition: A | Discharge status: Home / Self Care | Payer: Medicare Other | Source: Ambulatory Visit | Attending: Hematology and Oncology | Admitting: Hematology and Oncology

## 2017-05-02 ENCOUNTER — Ambulatory Visit: Payer: Self-pay

## 2017-05-02 ENCOUNTER — Telehealth: Payer: Self-pay

## 2017-05-02 ENCOUNTER — Other Ambulatory Visit: Payer: Self-pay

## 2017-05-02 ENCOUNTER — Ambulatory Visit: Payer: Self-pay | Admitting: Hematology and Oncology

## 2017-05-02 DIAGNOSIS — C787 Secondary malignant neoplasm of liver and intrahepatic bile duct: Secondary | ICD-10-CM | POA: Diagnosis not present

## 2017-05-02 DIAGNOSIS — Z170 Estrogen receptor positive status [ER+]: Secondary | ICD-10-CM | POA: Diagnosis not present

## 2017-05-02 DIAGNOSIS — C7951 Secondary malignant neoplasm of bone: Secondary | ICD-10-CM | POA: Diagnosis not present

## 2017-05-02 DIAGNOSIS — K8020 Calculus of gallbladder without cholecystitis without obstruction: Secondary | ICD-10-CM | POA: Insufficient documentation

## 2017-05-02 DIAGNOSIS — C50511 Malignant neoplasm of lower-outer quadrant of right female breast: Secondary | ICD-10-CM | POA: Diagnosis not present

## 2017-05-02 DIAGNOSIS — C50919 Malignant neoplasm of unspecified site of unspecified female breast: Secondary | ICD-10-CM | POA: Diagnosis not present

## 2017-05-02 DIAGNOSIS — C7800 Secondary malignant neoplasm of unspecified lung: Secondary | ICD-10-CM | POA: Insufficient documentation

## 2017-05-02 DIAGNOSIS — J90 Pleural effusion, not elsewhere classified: Secondary | ICD-10-CM | POA: Diagnosis not present

## 2017-05-02 LAB — GLUCOSE, CAPILLARY: Glucose-Capillary: 116 mg/dL — ABNORMAL HIGH (ref 65–99)

## 2017-05-02 MED ORDER — FLUDEOXYGLUCOSE F - 18 (FDG) INJECTION
9.3000 | Freq: Once | INTRAVENOUS | Status: AC | PRN
  Administered 2017-05-02: 9.3 via INTRAVENOUS

## 2017-05-02 NOTE — Telephone Encounter (Signed)
We called pt to reschedule her appt. She is rescheduled to Monday Sept 24th at 2p. We also rescheduled her labs and injection to that day. She verbalized understanding. No further concerns.  Cyndia Bent RN

## 2017-05-03 ENCOUNTER — Ambulatory Visit: Payer: Self-pay

## 2017-05-03 ENCOUNTER — Ambulatory Visit: Payer: Self-pay | Admitting: Hematology and Oncology

## 2017-05-03 ENCOUNTER — Other Ambulatory Visit: Payer: Self-pay

## 2017-05-06 ENCOUNTER — Other Ambulatory Visit: Payer: Self-pay

## 2017-05-06 ENCOUNTER — Ambulatory Visit (HOSPITAL_BASED_OUTPATIENT_CLINIC_OR_DEPARTMENT_OTHER): Payer: Medicare Other

## 2017-05-06 ENCOUNTER — Other Ambulatory Visit (HOSPITAL_BASED_OUTPATIENT_CLINIC_OR_DEPARTMENT_OTHER): Payer: Medicare Other

## 2017-05-06 ENCOUNTER — Other Ambulatory Visit: Payer: Self-pay | Admitting: Hematology and Oncology

## 2017-05-06 ENCOUNTER — Ambulatory Visit (HOSPITAL_BASED_OUTPATIENT_CLINIC_OR_DEPARTMENT_OTHER): Payer: Medicare Other | Admitting: Hematology and Oncology

## 2017-05-06 DIAGNOSIS — Z5111 Encounter for antineoplastic chemotherapy: Secondary | ICD-10-CM | POA: Diagnosis not present

## 2017-05-06 DIAGNOSIS — C50511 Malignant neoplasm of lower-outer quadrant of right female breast: Secondary | ICD-10-CM

## 2017-05-06 DIAGNOSIS — Z170 Estrogen receptor positive status [ER+]: Secondary | ICD-10-CM

## 2017-05-06 DIAGNOSIS — C7800 Secondary malignant neoplasm of unspecified lung: Secondary | ICD-10-CM

## 2017-05-06 DIAGNOSIS — C787 Secondary malignant neoplasm of liver and intrahepatic bile duct: Secondary | ICD-10-CM

## 2017-05-06 DIAGNOSIS — C7951 Secondary malignant neoplasm of bone: Secondary | ICD-10-CM | POA: Diagnosis not present

## 2017-05-06 LAB — CBC WITH DIFFERENTIAL/PLATELET
BASO%: 2.6 % — AB (ref 0.0–2.0)
Basophils Absolute: 0.1 10*3/uL (ref 0.0–0.1)
EOS ABS: 0 10*3/uL (ref 0.0–0.5)
EOS%: 0.3 % (ref 0.0–7.0)
HCT: 37.7 % (ref 34.8–46.6)
HEMOGLOBIN: 13.3 g/dL (ref 11.6–15.9)
LYMPH%: 34 % (ref 14.0–49.7)
MCH: 43.3 pg — ABNORMAL HIGH (ref 25.1–34.0)
MCHC: 35.3 g/dL (ref 31.5–36.0)
MCV: 122.8 fL — AB (ref 79.5–101.0)
MONO#: 0.6 10*3/uL (ref 0.1–0.9)
MONO%: 19.6 % — AB (ref 0.0–14.0)
NEUT%: 43.5 % (ref 38.4–76.8)
NEUTROS ABS: 1.4 10*3/uL — AB (ref 1.5–6.5)
PLATELETS: 99 10*3/uL — AB (ref 145–400)
RBC: 3.07 10*6/uL — AB (ref 3.70–5.45)
RDW: 13.2 % (ref 11.2–14.5)
WBC: 3.1 10*3/uL — AB (ref 3.9–10.3)
lymph#: 1.1 10*3/uL (ref 0.9–3.3)

## 2017-05-06 LAB — COMPREHENSIVE METABOLIC PANEL
ALBUMIN: 3.4 g/dL — AB (ref 3.5–5.0)
ALK PHOS: 79 U/L (ref 40–150)
ALT: 45 U/L (ref 0–55)
AST: 73 U/L — ABNORMAL HIGH (ref 5–34)
Anion Gap: 8 mEq/L (ref 3–11)
BILIRUBIN TOTAL: 1.41 mg/dL — AB (ref 0.20–1.20)
BUN: 9.5 mg/dL (ref 7.0–26.0)
CO2: 30 meq/L — AB (ref 22–29)
CREATININE: 0.8 mg/dL (ref 0.6–1.1)
Calcium: 9.5 mg/dL (ref 8.4–10.4)
Chloride: 100 mEq/L (ref 98–109)
EGFR: 83 mL/min/{1.73_m2} — AB (ref >90)
GLUCOSE: 116 mg/dL (ref 70–140)
Potassium: 3.7 mEq/L (ref 3.5–5.1)
SODIUM: 139 meq/L (ref 136–145)
TOTAL PROTEIN: 7.2 g/dL (ref 6.4–8.3)

## 2017-05-06 MED ORDER — ALBUTEROL SULFATE HFA 108 (90 BASE) MCG/ACT IN AERS: INHALATION_SPRAY | RESPIRATORY_TRACT | 6 refills | Status: DC

## 2017-05-06 MED ORDER — FULVESTRANT 250 MG/5ML IM SOLN
500.0000 mg | Freq: Once | INTRAMUSCULAR | Status: AC
Start: 2017-05-06 — End: 2017-05-06
  Administered 2017-05-06: 500 mg via INTRAMUSCULAR
  Filled 2017-05-06: qty 10

## 2017-05-06 MED ORDER — DENOSUMAB 120 MG/1.7ML ~~LOC~~ SOLN
120.0000 mg | Freq: Once | SUBCUTANEOUS | Status: DC
  Filled 2017-05-06: qty 1.7

## 2017-05-06 MED ORDER — AZITHROMYCIN 250 MG PO TABS: ORAL_TABLET | ORAL | 0 refills | Status: DC

## 2017-05-06 MED ORDER — PROCHLORPERAZINE MALEATE 10 MG PO TABS: 10.0000 mg | ORAL_TABLET | Freq: Four times a day (QID) | ORAL | 0 refills | Status: DC | PRN

## 2017-05-06 MED FILL — PROMETHAZINE 25 MG TABLET: 25 | 7 days supply | Qty: 30 | Fill #1

## 2017-05-06 MED FILL — PROAIR HFA 90 MCG INHALER: 108 (90 BAS | 30 days supply | Qty: 9 | Fill #0

## 2017-05-06 MED FILL — AZITHROMYCIN 250 MG TAB: 250 | 5 days supply | Qty: 6 | Fill #0

## 2017-05-06 MED FILL — PROCHLORPERAZINE 10 MG TAB: 10 | 8 days supply | Qty: 30 | Fill #0

## 2017-05-06 MED FILL — ALBUTEROL 0.083% INHAL SOLN: (2.5 MG/3ML | 7 days supply | Qty: 90 | Fill #3
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
## 2017-05-07 ENCOUNTER — Telehealth: Payer: Self-pay | Admitting: Hematology and Oncology

## 2017-05-07 ENCOUNTER — Encounter (HOSPITAL_COMMUNITY): Payer: Medicare Other

## 2017-05-07 NOTE — Telephone Encounter (Signed)
Scheduled appt with [facility] per 9/24 sch msg. Left message for patient regarding the appt.

## 2017-05-16 ENCOUNTER — Telehealth: Payer: Self-pay | Admitting: Internal Medicine

## 2017-05-16 NOTE — Telephone Encounter (Signed)
Pt called this week asking if Dr. [name] Gaskins is indeed with [facility] and what their number is to call and reschedule an appt. Called pt back and informed her of the phone number [phone]) and advised her to call [facility] with any questions she may have regarding her future appts or their providers. Pt verbalized understanding and denied any further questions or concerns at this time.

## 2017-05-29 ENCOUNTER — Ambulatory Visit: Payer: Self-pay | Admitting: Critical Care Medicine

## 2017-05-29 ENCOUNTER — Ambulatory Visit: Payer: Medicare Other | Attending: Critical Care Medicine | Admitting: Critical Care Medicine

## 2017-05-29 ENCOUNTER — Encounter: Payer: Self-pay | Admitting: Critical Care Medicine

## 2017-05-29 VITALS — BP 135/86 | HR 94 | Temp 98.3°F | Resp 18 | Ht 65.0 in | Wt 187.8 lb

## 2017-05-29 DIAGNOSIS — C7800 Secondary malignant neoplasm of unspecified lung: Secondary | ICD-10-CM | POA: Insufficient documentation

## 2017-05-29 DIAGNOSIS — J45991 Cough variant asthma: Secondary | ICD-10-CM

## 2017-05-29 DIAGNOSIS — Z811 Family history of alcohol abuse and dependence: Secondary | ICD-10-CM | POA: Insufficient documentation

## 2017-05-29 DIAGNOSIS — Z79899 Other long term (current) drug therapy: Secondary | ICD-10-CM | POA: Insufficient documentation

## 2017-05-29 DIAGNOSIS — Z9221 Personal history of antineoplastic chemotherapy: Secondary | ICD-10-CM | POA: Insufficient documentation

## 2017-05-29 DIAGNOSIS — J9611 Chronic respiratory failure with hypoxia: Secondary | ICD-10-CM | POA: Insufficient documentation

## 2017-05-29 DIAGNOSIS — J9811 Atelectasis: Secondary | ICD-10-CM | POA: Diagnosis not present

## 2017-05-29 DIAGNOSIS — Z853 Personal history of malignant neoplasm of breast: Secondary | ICD-10-CM | POA: Insufficient documentation

## 2017-05-29 DIAGNOSIS — Z803 Family history of malignant neoplasm of breast: Secondary | ICD-10-CM | POA: Diagnosis not present

## 2017-05-29 DIAGNOSIS — I10 Essential (primary) hypertension: Secondary | ICD-10-CM | POA: Diagnosis not present

## 2017-05-29 NOTE — Assessment & Plan Note (Signed)
RML and RLL atelectasis and scar with bronchiectasis Probably from XRT changes R chest Plan Would be interested in dr Lamonte Sakai to assess for FOB

## 2017-05-29 NOTE — Assessment & Plan Note (Signed)
Chronic hypoxemic resp failure Plan Start oxygen 2L continuous

## 2017-05-29 NOTE — Progress Notes (Signed)
Subjective:    Patient ID: Suzanne [name], female    DOB: 1955-09-06, 61 y.o.   MRN: [id number]  60 y.o.F with cough variant asthma  here for evaluation.  Hx seeing Suzanne [name], then changed to Suzanne [name].  ? Diaphragm or reflux.  Has stage IV breast cancer.  Good results with chemorx. Lesions in the lung may be an issue.  Days with copious secretions and may have to cough for > 1hour.    Mets: lung, liver and bone    Had XRT to Right axilla, brachytherapy with seeds, 2008.     Shortness of Breath  This is a chronic problem. The current episode started more than 1 year ago. The problem occurs constantly (sleeps on cough, short of breath even at rest. can only sleep on Right side). The problem has been gradually worsening. Associated symptoms include sputum production and wheezing. Pertinent negatives include no abdominal pain, chest pain, fever, hemoptysis or swollen glands. The symptoms are aggravated by lying flat, eating, any activity and exercise. Associated symptoms comments: Zpak did not help Mucus is creamy white to clear. . She has tried beta agonist inhalers for the symptoms. The treatment provided no relief. Her past medical history is significant for asthma.   Past Medical History:  Diagnosis Date  . Alcoholism ([place])   . Cancer [facility])    breast ca - 1994; recurred in 2007. s/p masectomy with flap rconstruction aand chemo '94. local recurrence on chest wall. chemo and XRT '07. now femara  . Depression   . Dyspnea    myoview 2011: EF 55% question of  mild reverible anterior defect. thought to be breast attenuation. echo 45-50% with global HK. Grade 1 diastolyic dysfunction. RV nml. cardiac MRI with EF 44% with septal HK. no scar   . History of coronary artery stent placement   . Hyperlipidemia   . Hypertension   . Insomnia   . Lung disorder      Family History  Problem Relation Age of Onset  . Hypertension Mother   . Heart attack Father   . Alcohol abuse Father   . Coronary  artery disease Unknown        family hx  . Hyperlipidemia Unknown        family hx  . Thyroid disease Unknown        family hx   . Alcohol abuse Paternal Grandfather   . Alcohol abuse Paternal Uncle   . Cancer Paternal Grandmother        breast  . Alcohol abuse Brother      Social History   Social History  . Marital status: Married    Spouse name: N/A  . Number of children: N/A  . Years of education: N/A   Occupational History  . Disabilty    Social History Main Topics  . Smoking status: Never Smoker  . Smokeless tobacco: Never Used     Comment: just socially   . Alcohol use Yes     Comment: No alcohol since 05/22/2012  . Drug use: No  . Sexual activity: Yes    Birth control/ protection: Surgical   Other Topics Concern  . Not on file   Social History Narrative   Suzanne [name] was born in [place] and lived there until high school, when she moved to [facility]. She graduated from the [employer] with a bachelor of science in nursing. 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 She has been married for 27 years, and has 2 children, a 58 year old son, and a 36 year old daughter. She denies any legal difficulties. She associates as a Engineer, manufacturing.      Allergies  Allergen Reactions  . Ace Inhibitors     cough     Outpatient Medications Prior to Visit  Medication Sig Dispense Refill  . albuterol (PROAIR HFA) 108 (90 Base) MCG/ACT inhaler INHALE 1 PUFF BY MOUTH EVERY 6 HOURS AS NEEDED FOR WHEEZING OR SHORTNESS OF BREATH 8.5 g 6  . bisoprolol (ZEBETA) 5 MG tablet TAKE 1 TABLET (5 MG TOTAL) BY MOUTH DAILY. 30 tablet 2  . calcium carbonate (OS-CAL - DOSED IN MG OF ELEMENTAL CALCIUM) 1250 (500 Ca) MG tablet Take 1 tablet by mouth.    . cholecalciferol (VITAMIN D) 1000 units tablet Take 1,000 Units by mouth daily.    Marland Kitchen gabapentin (NEURONTIN) 300 MG capsule Take 1 capsule (300 mg total) by mouth 3  (three) times daily. One twice daily (Patient taking differently: Take 300 mg by mouth at bedtime as needed. One twice daily) 90 capsule 3  . IBRANCE 125 MG capsule TAKE 1 CAPSULE BY MOUTH ONCE DAILY WITH BREAKFAST. TAKE WHOLE WITH FOOD. 21 capsule 6  . ibuprofen (ADVIL,MOTRIN) 200 MG tablet Take 200 mg by mouth every 6 (six) hours as needed.    Marland Kitchen letrozole (FEMARA) 2.5 MG tablet TAKE 1 TABLET BY MOUTH DAILY. 90 tablet 3  . LORazepam (ATIVAN) 2 MG tablet TAKE 1/2 TO 1 TABLET BY MOUTH EACH NIGHT AT BEDTIME 30 tablet 3  . prochlorperazine (COMPAZINE) 10 MG tablet Take 1 tablet (10 mg total) by mouth every 6 (six) hours as needed for nausea or vomiting. 30 tablet 0  . promethazine (PHENERGAN) 25 MG tablet Take 1 tablet (25 mg total) by mouth every 6 (six) hours as needed for nausea. 30 tablet 3  . azithromycin (ZITHROMAX Z-PAK) 250 MG tablet Use as directed (Patient not taking: Reported on 05/29/2017) 6 each 0  . PROAIR HFA 108 (90 Base) MCG/ACT inhaler INHALE 1 PUFF BY MOUTH EVERY 6 HOURS AS NEEDED FOR WHEEZING OR SHORTNESS OF BREATH 8.5 g 0   No facility-administered medications prior to visit.      Review of Systems  Constitutional: Negative for fever.  Respiratory: Positive for sputum production, shortness of breath and wheezing. Negative for hemoptysis.   Cardiovascular: Negative for chest pain.  Gastrointestinal: Negative for abdominal pain.       Objective:   Physical Exam Vitals:   05/29/17 1113  BP: 135/86  Pulse: 94  Resp: 18  Temp: 98.3 F (36.8 C)  TempSrc: Oral  SpO2: 91%  Weight: 187 lb 12.8 oz (85.2 kg)  Height: 5\' 5"  (1.651 m)   Amb pulse OX 85% RA  Gen: Pleasant, well-nourished, in no distress,  normal affect  ENT: No lesions,  mouth clear,  oropharynx clear, no postnasal drip  Neck: No JVD, no TMG, no carotid bruits  Lungs: No use of accessory muscles, no dullness to percussion, upper airway inspiratory wheeze,  Scattered rhonchi RLL, decreased BS bilateral  and into RLL/RML.  Expired wheezes R>L  Cardiovascular: RRR, heart sounds normal, no murmur or gallops, no peripheral edema  Abdomen: soft and NT, no HSM,  BS normal  Musculoskeletal: No deformities, no cyanosis or clubbing  Neuro: alert, non focal  Skin: Warm, no lesions or rashes  No results found.  CT Chest 10/2016 PET 04/2017 reviewed  in Epic Significant RML RLL bronchiectasis and scar/volume loss Left hilar LAN that corresponds to PET uptake IMPRESSION: 1. Stable postsurgical changes in the right breast and right axilla. No evidence of local recurrence or thoracic metastatic disease. Small right pleural effusion appears unchanged. 2. No residual measurable hepatic metastatic disease. 3. Stable sclerotic lesions throughout the ribs, spine and pelvis consistent with treated osseous metastases. No new lesions seen. 4. Please refer to PET-CT results of the same date.    03/02/2014  pfts  FEV1  1.45(56%) ratio 89 /VC  1.48 s obst, dlco 78 corrects to 154 and ERV 16%   - sinus CT 04/27/15 > No evidence of sinusitis. - FENO 03/20/2016  =  10  - Allergy profile 03/20/2016 >  Eos 0.1 /  IgE  99  RAST pos  dog > cat - 03/20/2016  After extensive coaching HFA effectiveness =    90% but immediate severe coughing fits produced - 03/20/2016  changed neurontin to 300 mg bid from hs prn> did not do 04/11/2016  - 03/20/16 HSP profile neg  - Spirometry 04/11/2016  FEV1 0.91 (34%)  Ratio 71  VC 1.29  With active wheeze/ mild curvature - FENO 04/11/2016  =   20     Assessment & Plan:  I personally reviewed all images and lab data in the [facility] system as well as any outside material available during this office visit and agree with the  radiology impressions.   Cough variant asthma Cough variant asthma Prior w/u and Rx per Suzanne [name] Has progressive airway obstruction Plan Use albuterol nebs bid- tid Use flutter valve tid spirometery pre/post   Lung metastases (HCC) Peripheral lung mets subpleural,   Left hilar involvement. RML RLL volume loss more likely d/t prior XRT changes Plan Referral to Suzanne Franco Collet for possible FOB to see if endobronchial lesion   Chronic hypoxemic respiratory failure ([place]) Chronic hypoxemic resp failure Plan Start oxygen 2L continuous  Atelectasis of right lung RML and RLL atelectasis and scar with bronchiectasis Probably from XRT changes R chest Plan Would be interested in Suzanne Lamonte Sakai to assess for FOB   Diagnoses and all orders for this visit:  Atelectasis of right lung -     Ambulatory referral to Pulmonology -     Pulmonary Function Test; Future  Malignant neoplasm metastatic to lung, unspecified laterality [facility]) -     Ambulatory referral to Pulmonology -     Pulmonary Function Test; Future  Chronic hypoxemic respiratory failure ([place]) -     For home use only DME oxygen  Cough variant asthma

## 2017-05-29 NOTE — Assessment & Plan Note (Addendum)
Cough variant asthma Prior w/u and Rx per Dr Melvyn Novas Has progressive airway obstruction Plan Use albuterol nebs bid- tid Use flutter valve tid spirometery pre/post

## 2017-05-29 NOTE — Assessment & Plan Note (Signed)
Peripheral lung mets subpleural,  Left hilar involvement. RML RLL volume loss more likely d/t prior XRT changes Plan Referral to Dr Franco Collet for possible FOB to see if endobronchial lesion

## 2017-05-29 NOTE — Patient Instructions (Signed)
Dr Lamonte Sakai to call you to set up a bronchoscopy, he may want to see you in the office first Oxygen 2Liters continuous is recommended Stay on nebulizer 2-3 times per day as needed Use flutter valve 2-4 times per day Lung Function tests will be obtained I can see you back after Dr Lamonte Sakai assesses you

## 2017-05-29 NOTE — Progress Notes (Signed)
Patient complains shortness of breathe

## 2017-05-30 ENCOUNTER — Telehealth: Payer: Self-pay | Admitting: General Practice

## 2017-05-30 NOTE — Telephone Encounter (Signed)
Patient is requesting o2 and was seen by you on 05/29/17. Please advise on this request.

## 2017-05-30 NOTE — Telephone Encounter (Signed)
Pt called since she saw Dr [name] Gaskins yesterday and she having hard time getting information how she can get oxygen, please call her back

## 2017-06-03 ENCOUNTER — Ambulatory Visit (HOSPITAL_BASED_OUTPATIENT_CLINIC_OR_DEPARTMENT_OTHER): Payer: Medicare Other

## 2017-06-03 ENCOUNTER — Other Ambulatory Visit (HOSPITAL_BASED_OUTPATIENT_CLINIC_OR_DEPARTMENT_OTHER): Payer: Medicare Other

## 2017-06-03 VITALS — BP 105/74 | HR 80 | Temp 98.2°F | Resp 18

## 2017-06-03 DIAGNOSIS — Z170 Estrogen receptor positive status [ER+]: Principal | ICD-10-CM

## 2017-06-03 DIAGNOSIS — C50511 Malignant neoplasm of lower-outer quadrant of right female breast: Secondary | ICD-10-CM

## 2017-06-03 DIAGNOSIS — Z5111 Encounter for antineoplastic chemotherapy: Secondary | ICD-10-CM | POA: Diagnosis not present

## 2017-06-03 DIAGNOSIS — C7951 Secondary malignant neoplasm of bone: Secondary | ICD-10-CM | POA: Diagnosis not present

## 2017-06-03 LAB — CBC WITH DIFFERENTIAL/PLATELET
BASO%: 1.9 % (ref 0.0–2.0)
Basophils Absolute: 0.1 10*3/uL (ref 0.0–0.1)
EOS ABS: 0 10*3/uL (ref 0.0–0.5)
EOS%: 0.5 % (ref 0.0–7.0)
HEMATOCRIT: 41.5 % (ref 34.8–46.6)
HEMOGLOBIN: 14.7 g/dL (ref 11.6–15.9)
LYMPH#: 1.2 10*3/uL (ref 0.9–3.3)
LYMPH%: 33.2 % (ref 14.0–49.7)
MCH: 44.4 pg — AB (ref 25.1–34.0)
MCHC: 35.4 g/dL (ref 31.5–36.0)
MCV: 125.4 fL — AB (ref 79.5–101.0)
MONO#: 0.9 10*3/uL (ref 0.1–0.9)
MONO%: 22.9 % — ABNORMAL HIGH (ref 0.0–14.0)
NEUT#: 1.5 10*3/uL (ref 1.5–6.5)
NEUT%: 41.5 % (ref 38.4–76.8)
Platelets: 110 10*3/uL — ABNORMAL LOW (ref 145–400)
RBC: 3.31 10*6/uL — ABNORMAL LOW (ref 3.70–5.45)
RDW: 13.4 % (ref 11.2–14.5)
WBC: 3.7 10*3/uL — ABNORMAL LOW (ref 3.9–10.3)

## 2017-06-03 LAB — COMPREHENSIVE METABOLIC PANEL
ALBUMIN: 3.7 g/dL (ref 3.5–5.0)
ALK PHOS: 81 U/L (ref 40–150)
ALT: 69 U/L — ABNORMAL HIGH (ref 0–55)
ANION GAP: 11 meq/L (ref 3–11)
AST: 121 U/L — AB (ref 5–34)
BUN: 11.3 mg/dL (ref 7.0–26.0)
CALCIUM: 9.6 mg/dL (ref 8.4–10.4)
CHLORIDE: 100 meq/L (ref 98–109)
CO2: 28 mEq/L (ref 22–29)
Creatinine: 0.9 mg/dL (ref 0.6–1.1)
Glucose: 144 mg/dl — ABNORMAL HIGH (ref 70–140)
POTASSIUM: 3.7 meq/L (ref 3.5–5.1)
Sodium: 139 mEq/L (ref 136–145)
Total Bilirubin: 1.76 mg/dL — ABNORMAL HIGH (ref 0.20–1.20)
Total Protein: 8.1 g/dL (ref 6.4–8.3)

## 2017-06-03 MED ORDER — FULVESTRANT 250 MG/5ML IM SOLN
500.0000 mg | Freq: Once | INTRAMUSCULAR | Status: AC
  Administered 2017-06-03: 500 mg via INTRAMUSCULAR
  Filled 2017-06-03: qty 10

## 2017-06-04 ENCOUNTER — Encounter (HOSPITAL_COMMUNITY): Payer: Self-pay

## 2017-06-04 ENCOUNTER — Telehealth: Payer: Self-pay | Admitting: Internal Medicine

## 2017-06-04 ENCOUNTER — Telehealth: Payer: Self-pay | Admitting: Emergency Medicine

## 2017-06-04 ENCOUNTER — Ambulatory Visit: Payer: Self-pay

## 2017-06-04 NOTE — Telephone Encounter (Signed)
Spoke with pt, who states she is unable to come in for SMW today. Pt also stated that PFT has been rescheduled to 06/11/17, due to not feeling well today. I have offered to schedule consult on 06/06/17 at 2:15. Pt would like to know if she should have PFT prior to consult with RB. Will schedule SMW based on consult, as we do have availably on 10/25.  RB please advise. Thanks.

## 2017-06-04 NOTE — Telephone Encounter (Signed)
Spoke with Melissa with [facility], who states Dr. [name] Gaskins has [facility].  Melissa states all required sats were not obtained. Pt is scheduled for PFT today at [place]. Melissa would like to know if SMW can be performed today. I do see where a message has been left for pt, regarding scheduling consult with RB on 10/25.   RB please advise if okay to order SMW. Thanks.

## 2017-06-04 NOTE — Telephone Encounter (Signed)
[place] [phone]); Dr. [name] Gaskins ordered oxygen through [facility]; Pt have appointment today at 2pm to have PFTs done at  hospital [facility]); would like to know if pt can come in and do a 6 min walk test today.Marland Kitchen

## 2017-06-04 NOTE — Telephone Encounter (Signed)
Ok for her to keep the 10/25 consult, get the PFT after. Also ok to perform the O2 assessment / 6 minute walk 10/25 if she is able.

## 2017-06-04 NOTE — Telephone Encounter (Signed)
OK with me to order a 6 minute walk, then O2 titration if she desaturates.

## 2017-06-04 NOTE — Telephone Encounter (Signed)
Will close encounter, as another has been created.   

## 2017-06-04 NOTE — Telephone Encounter (Signed)
lmtcb x1 for pt. I have held an appointment slot on RB's schedule on 10/25 at 2:15pm. If this works for the pt's schedule, she can be placed there.

## 2017-06-04 NOTE — Telephone Encounter (Signed)
Spoke with pt, she agreed to have 6 min walk on 10/25 before appt with RB. Nothing further is needed. Appt made.

## 2017-06-04 NOTE — Telephone Encounter (Signed)
Ria Comment - Dr [name] Gaskins asked [place] to work this patient to be seen, new pt for [place].   Thanks, RB

## 2017-06-06 ENCOUNTER — Ambulatory Visit (INDEPENDENT_AMBULATORY_CARE_PROVIDER_SITE_OTHER): Payer: Medicare Other | Admitting: Emergency Medicine

## 2017-06-06 ENCOUNTER — Telehealth: Payer: Self-pay | Admitting: Emergency Medicine

## 2017-06-06 ENCOUNTER — Ambulatory Visit: Payer: Medicare Other | Admitting: Emergency Medicine

## 2017-06-06 ENCOUNTER — Encounter: Payer: Self-pay | Admitting: Emergency Medicine

## 2017-06-06 DIAGNOSIS — R05 Cough: Secondary | ICD-10-CM

## 2017-06-06 DIAGNOSIS — R053 Chronic cough: Secondary | ICD-10-CM

## 2017-06-06 DIAGNOSIS — J9611 Chronic respiratory failure with hypoxia: Secondary | ICD-10-CM

## 2017-06-06 MED ORDER — LORATADINE 10 MG PO TABS: 10.0000 mg | ORAL_TABLET | Freq: Every day | ORAL | 5 refills | Status: AC

## 2017-06-06 MED ORDER — FLUTICASONE PROPIONATE 50 MCG/ACT NA SUSP: 2.0000 | Freq: Every day | NASAL | 5 refills | Status: DC

## 2017-06-06 MED FILL — PROMETHAZINE 25 MG TABLET: 25 | 7 days supply | Qty: 30 | Fill #2

## 2017-06-06 MED FILL — ALBUTEROL 0.083% INHAL SOLN: (2.5 MG/3ML | 7 days supply | Qty: 90 | Fill #4

## 2017-06-06 MED FILL — FLUTICASONE PROP 50 MCG SPR: 50 | 30 days supply | Qty: 16 | Fill #0

## 2017-06-06 MED FILL — PROAIR HFA 90 MCG INHALER: 108 (90 BAS | 50 days supply | Qty: 9 | Fill #0

## 2017-06-06 NOTE — Telephone Encounter (Signed)
Pt has been scheduled for SMW at 12pm. Nothing further needed.

## 2017-06-06 NOTE — Progress Notes (Signed)
Subjective:    Patient ID: Suzanne [name], female    DOB: 1956/05/03, 61 y.o.   MRN: [id number]  HPI 61 year old never smoker with a history of stage IV breast cancer involving the left hilum, subpleural pulmonary metastases, liver, bone.  She is undergone right mastectomy and reconstruction, chemotherapy, targeted right axillary radiation.  She has been followed in our office for possible cough variant asthma.  She recently also saw Dr. [name] [name].  She has been having significant increase in cough, copious secretions that are worst in the am, clear mucous to white. Also has some drainage from her head / sinuses. Happens again 3-4 pm. Her most recent imaging includes a PET scan from 05/02/17 that I have reviewed.  This shows some progressive foci of right lower lobe and right middle lobe atelectasis, right-sided pleural thickening and effusion without any clear hypermetabolism.  She currently has albuterol to use as needed. Uses about 1-2x a day. Feels that it helps sometimes. She hsa PFT ordered for next week. She has a lot of rhinitis, not on meds currently   Review of Systems  Constitutional: Positive for appetite change and unexpected weight change. Negative for fever.  HENT: Positive for congestion. Negative for dental problem, ear pain, nosebleeds, postnasal drip, rhinorrhea, sinus pressure, sneezing, sore throat and trouble swallowing.   Eyes: Negative for redness and itching.  Respiratory: Positive for cough and shortness of breath. Negative for chest tightness and wheezing.   Cardiovascular: Negative for palpitations and leg swelling.  Gastrointestinal: Negative for nausea and vomiting.  Genitourinary: Negative for dysuria.  Musculoskeletal: Negative for joint swelling.  Skin: Negative for rash.  Neurological: Negative for headaches.  Hematological: Does not bruise/bleed easily.  Psychiatric/Behavioral: Negative for dysphoric mood. The patient is not nervous/anxious.     Past Medical  History:  Diagnosis Date  . Alcoholism ([place])   . Cancer [facility])    breast ca - 1994; recurred in 2007. s/p masectomy with flap rconstruction aand chemo '94. local recurrence on chest wall. chemo and XRT '07. now femara  . Depression   . Dyspnea    myoview 2011: EF 55% question of  mild reverible anterior defect. thought to be breast attenuation. echo 45-50% with global HK. Grade 1 diastolyic dysfunction. RV nml. cardiac MRI with EF 44% with septal HK. no scar   . History of coronary artery stent placement   . Hyperlipidemia   . Hypertension   . Insomnia   . Lung disorder      Family History  Problem Relation Age of Onset  . Hypertension Mother   . Heart attack Father   . Alcohol abuse Father   . Coronary artery disease Unknown        family hx  . Hyperlipidemia Unknown        family hx  . Thyroid disease Unknown        family hx   . Alcohol abuse Paternal Grandfather   . Alcohol abuse Paternal Uncle   . Cancer Paternal Grandmother        breast  . Alcohol abuse Brother      Social History   Social History  . Marital status: Married    Spouse name: N/A  . Number of children: N/A  . Years of education: N/A   Occupational History  . Disabilty    Social History Main Topics  . Smoking status: Never Smoker  . Smokeless tobacco: Never Used     Comment: just socially   .  Alcohol use Yes     Comment: No alcohol since 05/22/2012  . Drug use: No  . Sexual activity: Yes    Birth control/ protection: Surgical   Other Topics Concern  . Not on file   Social History Narrative   Suzanne [name] was born in [place] and lived there until high school, when she moved to [place]. She graduated from the [employer] with a bachelor of science in nursing. 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 She has been married for 27 years, and has 2 children, a 77 year old son, and a 32 year old daughter. She  denies any legal difficulties. She associates as a Engineer, manufacturing.      Allergies  Allergen Reactions  . Ace Inhibitors     cough     Outpatient Medications Prior to Visit  Medication Sig Dispense Refill  . albuterol (PROAIR HFA) 108 (90 Base) MCG/ACT inhaler INHALE 1 PUFF BY MOUTH EVERY 6 HOURS AS NEEDED FOR WHEEZING OR SHORTNESS OF BREATH 8.5 g 6  . albuterol (PROVENTIL) (2.5 MG/3ML) 0.083% nebulizer solution Inhale 3 mLs into the lungs 4 (four) times daily as needed.  9  . bisoprolol (ZEBETA) 5 MG tablet TAKE 1 TABLET (5 MG TOTAL) BY MOUTH DAILY. 30 tablet 2  . calcium carbonate (OS-CAL - DOSED IN MG OF ELEMENTAL CALCIUM) 1250 (500 Ca) MG tablet Take 1 tablet by mouth.    . cholecalciferol (VITAMIN D) 1000 units tablet Take 1,000 Units by mouth daily.    Marland Kitchen gabapentin (NEURONTIN) 300 MG capsule Take 1 capsule (300 mg total) by mouth 3 (three) times daily. One twice daily (Patient taking differently: Take 300 mg by mouth at bedtime as needed. One twice daily) 90 capsule 3  . IBRANCE 125 MG capsule TAKE 1 CAPSULE BY MOUTH ONCE DAILY WITH BREAKFAST. TAKE WHOLE WITH FOOD. 21 capsule 6  . ibuprofen (ADVIL,MOTRIN) 200 MG tablet Take 200 mg by mouth every 6 (six) hours as needed.    Marland Kitchen letrozole (FEMARA) 2.5 MG tablet TAKE 1 TABLET BY MOUTH DAILY. 90 tablet 3  . LORazepam (ATIVAN) 2 MG tablet TAKE 1/2 TO 1 TABLET BY MOUTH EACH NIGHT AT BEDTIME 30 tablet 3  . prochlorperazine (COMPAZINE) 10 MG tablet Take 1 tablet (10 mg total) by mouth every 6 (six) hours as needed for nausea or vomiting. 30 tablet 0  . promethazine (PHENERGAN) 25 MG tablet Take 1 tablet (25 mg total) by mouth every 6 (six) hours as needed for nausea. 30 tablet 3  . azithromycin (ZITHROMAX Z-PAK) 250 MG tablet Use as directed (Patient not taking: Reported on 05/29/2017) 6 each 0   No facility-administered medications prior to visit.         Objective:   Physical Exam  Vitals:   06/06/17 1426  BP: 104/66  Pulse: 77  SpO2:  90%  Weight: 184 lb (83.5 kg)  Height: 5\' 5"  (1.651 m)   Gen: Pleasant, well-nourished, in no distress,  normal affect  ENT: No lesions,  mouth clear,  oropharynx clear, no postnasal drip  Neck: No JVD, no TMG, no carotid bruits  Lungs: No use of accessory muscles, clear without rales or rhonchi  Cardiovascular: RRR, heart sounds normal, no murmur or gallops, no peripheral edema  Musculoskeletal: No deformities, no cyanosis or clubbing  Neuro: alert, non focal  Skin: Warm, no lesions or rashes    Assessment & Plan:  Atelectasis of right  lung Question due to her history of radiation, elevated right hemidiaphragm.  Also must consider possible endobronchial lesions although I do not see any PET scan.  Discussed bronchoscopy with her today.  This may be indicated.  She is going to think about it.  We will decide based on her response to our other therapies  Chronic cough Suspect contribution of chronic rhinitis based on her history.  Also consider contribution of reflux although she has been tried on therapy before.  Unclear how much true asthma is present, pulmonary function testing is pending.  I will review with her when available.  For now we will start loratadine, fluticasone nasal spray.  Consider addition of a PPI as we go forward.  If she continues to cough and no answers then we will revisit possible bronchoscopy to visualize her right sided airways for possible endobronchial lesion/obstruction.  Baltazar Apo, MD, PhD 06/06/2017, 3:07 PM [facility] [phone] or if no answer [phone]

## 2017-06-06 NOTE — Assessment & Plan Note (Signed)
Question due to her history of radiation, elevated right hemidiaphragm.  Also must consider possible endobronchial lesions although I do not see any PET scan.  Discussed bronchoscopy with her today.  This may be indicated.  She is going to think about it.  We will decide based on her response to our other therapies

## 2017-06-06 NOTE — Progress Notes (Signed)
SIX MIN WALK 06/06/2017  Medications None  Supplimental Oxygen during Test? (L/min) No  Laps 9  Partial Lap (in Meters) 3  Baseline BP (sitting) 122/80  Baseline Heartrate 48  Baseline Dyspnea (Borg Scale) 2  Baseline Fatigue (Borg Scale) 3  Baseline SPO2 95  BP (sitting) 130/80  Heartrate 85  Dyspnea (Borg Scale) 5  Fatigue (Borg Scale) 5  SPO2 84  BP (sitting) 116/80  Heartrate 75  SPO2 92  Stopped or Paused before Six Minutes No  Distance Completed 435  Tech Comments: TA/CMA

## 2017-06-06 NOTE — Patient Instructions (Addendum)
Please start loratadine 10 mg daily Please start fluticasone nasal spray, 2 sprays each nostril once a day If your cough continues we can consider adding back reflux medication to see if this is at all helpful Get your pulmonary function testing is planned.  We will review this after Depending on your response to the above we may still decide to plan bronchoscopy to visualize your airways leading into the right middle lobe and right lower lobe. Follow with Dr Lamonte Sakai in 1 month

## 2017-06-06 NOTE — Assessment & Plan Note (Signed)
Suspect contribution of chronic rhinitis based on her history.  Also consider contribution of reflux although she has been tried on therapy before.  Unclear how much true asthma is present, pulmonary function testing is pending.  I will review with her when available.  For now we will start loratadine, fluticasone nasal spray.  Consider addition of a PPI as we go forward.  If she continues to cough and no answers then we will revisit possible bronchoscopy to visualize her right sided airways for possible endobronchial lesion/obstruction.

## 2017-06-11 ENCOUNTER — Inpatient Hospital Stay (HOSPITAL_COMMUNITY): Admission: RE | Admit: 2017-06-11 | Payer: Self-pay | Source: Ambulatory Visit

## 2017-06-13 MED FILL — LETROZOLE 2.5 MG TABLET: 2.5 | 90 days supply | Qty: 90 | Fill #1

## 2017-06-14 ENCOUNTER — Telehealth: Payer: Self-pay

## 2017-06-14 ENCOUNTER — Other Ambulatory Visit: Payer: Self-pay

## 2017-06-14 ENCOUNTER — Telehealth: Payer: Self-pay | Admitting: Emergency Medicine

## 2017-06-14 MED ORDER — LORAZEPAM 2 MG PO TABS: ORAL_TABLET | ORAL | 3 refills | Status: DC

## 2017-06-14 NOTE — Telephone Encounter (Signed)
Spoke with patient. She was under the impression that an order had been sent to [facility] after her 75mw for her to have a POC. Advised patient that I did not see an order for O2.   She then stated that Dr. [name] Gaskins was the one who wrote the order and wanted her to be evaluated by RB. She stated that his office then handled her a written RX for O2 instead of sending it in to [facility].   Patient did have a 43mw with our office prior to her appt on 06/06/17.   RB, please advise if you are willing to send in a correct order for her O2. Thanks!

## 2017-06-14 NOTE — Telephone Encounter (Signed)
Spoke extensively with Corene Cornea @ [facility] >> 2 of the 3 qualifying sats were obtained during the 10/25 6MW, insurance still needs the ambulatory sat on O2 but Corene Cornea reported that they are able to provide O2 in the interim.  Final piece must be obtained within 30 days of the 10/25 visit.    Patient is scheduled to see RB on 11/29 for 1 month follow up Called spoke with patient to see if she would like to move up her 1 month follow up (and see TP as RB is not in the office) vs come in for an additional NO CHARGE appt to obtain that last qualifying sat.  Pt would like to come in for nurse visit - this has been scheduled for 07/01/18  If we have a sample [employer] Mini pt can be qualified for this as well while she is here Corene Cornea mentioned that patient would like a POC).  Staff message sent to Corene Cornea to make him aware Nothing further needed at this time, will sign off

## 2017-06-14 NOTE — Telephone Encounter (Signed)
Pt called in to request more lorazepam refills. Pt would like for her script to be sent to [facility] in [place]. Called and lvm for pharmacist to refill ativan prescription for pt.  [phone].

## 2017-06-14 NOTE — Telephone Encounter (Signed)
Corene Cornea, [facility], calling stating needs 29mw info put into snapshot to try and qualify patient for O2.  Jason's CB is [phone] ext [phone].

## 2017-06-15 DIAGNOSIS — J9611 Chronic respiratory failure with hypoxia: Secondary | ICD-10-CM | POA: Diagnosis not present

## 2017-07-01 ENCOUNTER — Other Ambulatory Visit (HOSPITAL_BASED_OUTPATIENT_CLINIC_OR_DEPARTMENT_OTHER): Payer: Medicare Other

## 2017-07-01 ENCOUNTER — Ambulatory Visit (INDEPENDENT_AMBULATORY_CARE_PROVIDER_SITE_OTHER): Payer: Medicare Other | Admitting: *Deleted

## 2017-07-01 ENCOUNTER — Ambulatory Visit (HOSPITAL_BASED_OUTPATIENT_CLINIC_OR_DEPARTMENT_OTHER): Payer: Medicare Other

## 2017-07-01 ENCOUNTER — Telehealth: Payer: Self-pay | Admitting: Pharmacy Technician

## 2017-07-01 VITALS — BP 98/74 | HR 78 | Temp 98.4°F | Resp 18

## 2017-07-01 DIAGNOSIS — C50511 Malignant neoplasm of lower-outer quadrant of right female breast: Secondary | ICD-10-CM

## 2017-07-01 DIAGNOSIS — Z170 Estrogen receptor positive status [ER+]: Principal | ICD-10-CM

## 2017-07-01 DIAGNOSIS — C7951 Secondary malignant neoplasm of bone: Secondary | ICD-10-CM | POA: Diagnosis not present

## 2017-07-01 DIAGNOSIS — Z5111 Encounter for antineoplastic chemotherapy: Secondary | ICD-10-CM

## 2017-07-01 DIAGNOSIS — J9611 Chronic respiratory failure with hypoxia: Secondary | ICD-10-CM

## 2017-07-01 LAB — CBC WITH DIFFERENTIAL/PLATELET
BASO%: 2.9 % — ABNORMAL HIGH (ref 0.0–2.0)
BASOS ABS: 0.1 10*3/uL (ref 0.0–0.1)
EOS%: 0.8 % (ref 0.0–7.0)
Eosinophils Absolute: 0 10*3/uL (ref 0.0–0.5)
HCT: 38.3 % (ref 34.8–46.6)
HEMOGLOBIN: 13.6 g/dL (ref 11.6–15.9)
LYMPH#: 0.9 10*3/uL (ref 0.9–3.3)
LYMPH%: 36.6 % (ref 14.0–49.7)
MCH: 44.2 pg — AB (ref 25.1–34.0)
MCHC: 35.5 g/dL (ref 31.5–36.0)
MCV: 124.4 fL — AB (ref 79.5–101.0)
MONO#: 0.4 10*3/uL (ref 0.1–0.9)
MONO%: 16 % — ABNORMAL HIGH (ref 0.0–14.0)
NEUT#: 1.1 10*3/uL — ABNORMAL LOW (ref 1.5–6.5)
NEUT%: 43.7 % (ref 38.4–76.8)
Platelets: 75 10*3/uL — ABNORMAL LOW (ref 145–400)
RBC: 3.08 10*6/uL — AB (ref 3.70–5.45)
RDW: 13 % (ref 11.2–14.5)
WBC: 2.4 10*3/uL — ABNORMAL LOW (ref 3.9–10.3)

## 2017-07-01 LAB — COMPREHENSIVE METABOLIC PANEL
ALBUMIN: 3.5 g/dL (ref 3.5–5.0)
ALK PHOS: 70 U/L (ref 40–150)
ALT: 54 U/L (ref 0–55)
AST: 123 U/L — AB (ref 5–34)
Anion Gap: 9 mEq/L (ref 3–11)
BUN: 8.4 mg/dL (ref 7.0–26.0)
CHLORIDE: 100 meq/L (ref 98–109)
CO2: 29 meq/L (ref 22–29)
Calcium: 9.7 mg/dL (ref 8.4–10.4)
Creatinine: 0.8 mg/dL (ref 0.6–1.1)
GLUCOSE: 120 mg/dL (ref 70–140)
POTASSIUM: 3.9 meq/L (ref 3.5–5.1)
SODIUM: 138 meq/L (ref 136–145)
Total Bilirubin: 1.56 mg/dL — ABNORMAL HIGH (ref 0.20–1.20)
Total Protein: 7.7 g/dL (ref 6.4–8.3)

## 2017-07-01 MED ORDER — FULVESTRANT 250 MG/5ML IM SOLN
500.0000 mg | Freq: Once | INTRAMUSCULAR | Status: AC
  Administered 2017-07-01: 500 mg via INTRAMUSCULAR
  Filled 2017-07-01: qty 10

## 2017-07-01 MED ORDER — DENOSUMAB 120 MG/1.7ML ~~LOC~~ SOLN: 120.0000 mg | Freq: Once | SUBCUTANEOUS | Status: DC

## 2017-07-01 NOTE — Telephone Encounter (Signed)
[name]  Met patient in [place] to complete a re-enrollment application for [employer] in an effort to keep the patient's out of pocket expense for [place] at $0.    Application completed and faxed to [phone].   [employer] patient assistance phone number for follow up is [phone].   This encounter will be updated until final determination.  Fabio Asa. Melynda Keller, [place] [facility] [place] [phone] 07/01/2017 3:31 PM

## 2017-07-02 ENCOUNTER — Ambulatory Visit (HOSPITAL_COMMUNITY)
Admission: RE | Admit: 2017-07-02 | Discharge: 2017-07-02 | Disposition: A | Discharge status: Home / Self Care | Payer: Medicare Other | Source: Ambulatory Visit | Attending: Critical Care Medicine | Admitting: Critical Care Medicine

## 2017-07-02 DIAGNOSIS — J9811 Atelectasis: Secondary | ICD-10-CM | POA: Diagnosis not present

## 2017-07-02 DIAGNOSIS — C7800 Secondary malignant neoplasm of unspecified lung: Secondary | ICD-10-CM | POA: Diagnosis not present

## 2017-07-02 DIAGNOSIS — J449 Chronic obstructive pulmonary disease, unspecified: Secondary | ICD-10-CM | POA: Diagnosis not present

## 2017-07-02 LAB — PULMONARY FUNCTION TEST
DL/VA % PRED: 123 %
DL/VA: 5.83 ml/min/mmHg/L
DLCO cor % pred: 44 %
DLCO cor: 10.3 ml/min/mmHg
DLCO unc % pred: 44 %
DLCO unc: 10.37 ml/min/mmHg
FEF 25-75 POST: 0.75 L/s
FEF 25-75 PRE: 0.53 L/s
FEF2575-%Change-Post: 42 %
FEF2575-%Pred-Post: 32 %
FEF2575-%Pred-Pre: 22 %
FEV1-%Change-Post: 16 %
FEV1-%PRED-PRE: 33 %
FEV1-%Pred-Post: 38 %
FEV1-Post: 0.96 L
FEV1-Pre: 0.83 L
FEV1FVC-%Change-Post: 18 %
FEV1FVC-%Pred-Pre: 84 %
FEV6-%Change-Post: -1 %
FEV6-%PRED-POST: 39 %
FEV6-%PRED-PRE: 40 %
FEV6-POST: 1.23 L
FEV6-Pre: 1.25 L
FEV6FVC-%PRED-POST: 104 %
FEV6FVC-%Pred-Pre: 104 %
FVC-%CHANGE-POST: -1 %
FVC-%PRED-POST: 38 %
FVC-%PRED-PRE: 38 %
FVC-POST: 1.23 L
FVC-Pre: 1.25 L
POST FEV6/FVC RATIO: 100 %
PRE FEV1/FVC RATIO: 66 %
PRE FEV6/FVC RATIO: 100 %
Post FEV1/FVC ratio: 78 %
RV % PRED: 88 %
RV: 1.72 L
TLC % pred: 58 %
TLC: 2.88 L

## 2017-07-02 MED ORDER — ALBUTEROL SULFATE (2.5 MG/3ML) 0.083% IN NEBU
2.5000 mg | INHALATION_SOLUTION | Freq: Once | RESPIRATORY_TRACT | Status: AC
  Administered 2017-07-02: 2.5 mg via RESPIRATORY_TRACT

## 2017-07-10 ENCOUNTER — Other Ambulatory Visit: Payer: Self-pay | Admitting: Hematology and Oncology

## 2017-07-10 DIAGNOSIS — C50511 Malignant neoplasm of lower-outer quadrant of right female breast: Secondary | ICD-10-CM

## 2017-07-10 MED FILL — BISOPROLOL FUMARATE 5 MG TA: 5 | 90 days supply | Qty: 90 | Fill #2

## 2017-07-10 MED FILL — FLUTICASONE PROP 50 MCG SPR: 50 | 30 days supply | Qty: 16 | Fill #1

## 2017-07-10 MED FILL — PROMETHAZINE 25 MG TABLET: 25 | 7 days supply | Qty: 30 | Fill #3

## 2017-07-11 ENCOUNTER — Encounter: Payer: Self-pay | Admitting: Emergency Medicine

## 2017-07-11 ENCOUNTER — Ambulatory Visit: Payer: Medicare Other | Admitting: Emergency Medicine

## 2017-07-11 DIAGNOSIS — J45991 Cough variant asthma: Secondary | ICD-10-CM | POA: Diagnosis not present

## 2017-07-11 MED ORDER — TIOTROPIUM BROMIDE MONOHYDRATE 2.5 MCG/ACT IN AERS: 2.0000 | INHALATION_SPRAY | Freq: Every day | RESPIRATORY_TRACT | 5 refills | Status: DC

## 2017-07-11 MED FILL — SPIRIVA RESPIMAT INHAL SPRY: 2.5 | 30 days supply | Qty: 4 | Fill #0

## 2017-07-11 MED FILL — ALBUTEROL 0.083% INHAL SOLN: (2.5 MG/3ML | 7 days supply | Qty: 90 | Fill #5

## 2017-07-11 NOTE — Progress Notes (Signed)
Patient seen in the office today and instructed on use of spiriva respimat .  Patient expressed understanding and demonstrated technique. 

## 2017-07-11 NOTE — Patient Instructions (Addendum)
Please start Spiriva respimat 2 puffs once a day for the next 3-4 weeks. If you benefit from this call our office and we will continue it Continue to use albuterol nebulized or inhaler up to every 4 hours if needed for shortness of breath or chest tightness.  Continue your fluticasone nasal spray and loratadine-D as you are taking them.  We will hold off on bronchoscopy for now. We will review your next PET scan together and with oncology and decide whether there may be benefit from airway inspection.  Follow with Dr Lamonte Sakai in 3 months or sooner if you have any problems.

## 2017-07-11 NOTE — Progress Notes (Signed)
Subjective:    Patient ID: Suzanne [name], female    DOB: 09-27-1955, 61 y.o.   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  She is undergone right mastectomy and reconstruction, chemotherapy, targeted right axillary radiation.  She has been followed in our office for possible cough variant asthma.  She recently also saw Dr. [name] Gaskins.  She has been having significant increase in cough, copious secretions that are worst in the am, clear mucous to white. Also has some drainage from her head / sinuses. Happens again 3-4 pm. Her most recent imaging includes a PET scan from 05/02/17 that I have reviewed.  This shows some progressive foci of right lower lobe and right middle lobe atelectasis, right-sided pleural thickening and effusion without any clear hypermetabolism.  She currently has albuterol to use as needed. Uses about 1-2x a day. Feels that it helps sometimes. She hsa PFT ordered for next week. She has a lot of rhinitis, not on meds currently  ROV 07/11/17 --this is a follow-up visit for 61 year old woman, never smoker with history of stage IV breast cancer involving the left hilum, subpleural region, liver, bone.  She was seen for cough.  Imaging shows an elevated right hemidiaphragm and some pleural thickening, right hilar prominence and an endobronchial lesion was considered.  Bronchoscopy has not yet been done.  We try to treat her for standard contributors to cough including rhinitis, continued GERD therapy.  She underwent pulmonary function testing on 07/02/17 that I have personally reviewed.  This shows evidence for mixed obstruction and restriction without a bronchodilator response.  Significantly decreased total lung capacity and a decreased diffusion capacity that corrects for alveolar volume. She has benefited some, especially from the claritin D. Unclear whether she has benefited  from nasal steroid. She is dyspneic, hears wheezing. She is planning for a repeat PET scan beginning of 2019    Review of Systems  Constitutional: Positive for appetite change and unexpected weight change. Negative for fever.  HENT: Positive for congestion. Negative for dental problem, ear pain, nosebleeds, postnasal drip, rhinorrhea, sinus pressure, sneezing, sore throat and trouble swallowing.   Eyes: Negative for redness and itching.  Respiratory: Positive for cough and shortness of breath. Negative for chest tightness and wheezing.   Cardiovascular: Negative for palpitations and leg swelling.  Gastrointestinal: Negative for nausea and vomiting.  Genitourinary: Negative for dysuria.  Musculoskeletal: Negative for joint swelling.  Skin: Negative for rash.  Neurological: Negative for headaches.  Hematological: Does not bruise/bleed easily.  Psychiatric/Behavioral: Negative for dysphoric mood. The patient is not nervous/anxious.     Past Medical History:  Diagnosis Date  . Alcoholism ([place])   . Cancer [facility])    breast ca - 1994; recurred in 2007. s/p masectomy with flap rconstruction aand chemo '94. local recurrence on chest wall. chemo and XRT '07. now femara  . Depression   . Dyspnea    myoview 2011: EF 55% question of  mild reverible anterior defect. thought to be breast attenuation. echo 45-50% with global HK. Grade 1 diastolyic dysfunction. RV nml. cardiac MRI with EF 44% with septal HK. no scar   . History of coronary artery stent placement   . Hyperlipidemia   . Hypertension   . Insomnia   . Lung disorder      Family History  Problem Relation Age of Onset  . Hypertension Mother   . Heart  attack Father   . Alcohol abuse Father   . Coronary artery disease Unknown        family hx  . Hyperlipidemia Unknown        family hx  . Thyroid disease Unknown        family hx   . Alcohol abuse Paternal Grandfather   . Alcohol abuse Paternal Uncle   . Cancer Paternal Grandmother         breast  . Alcohol abuse Brother      Social History   Socioeconomic History  . Marital status: Married    Spouse name: Not on file  . Number of children: Not on file  . Years of education: Not on file  . Highest education level: Not on file  Social Needs  . Financial resource strain: Not on file  . Food insecurity - worry: Not on file  . Food insecurity - inability: Not on file  . Transportation needs - medical: Not on file  . Transportation needs - non-medical: Not on file  Occupational History  . Occupation: Disabilty  Tobacco Use  . Smoking status: Never Smoker  . Smokeless tobacco: Never Used  . Tobacco comment: just socially   Substance and Sexual Activity  . Alcohol use: Yes    Comment: No alcohol since 05/22/2012  . Drug use: No  . Sexual activity: Yes    Birth control/protection: Surgical  Other Topics Concern  . Not on file  Social History Narrative   Meghana was born in [place] and lived there until high school, when she moved to [facility]. She graduated from the [employer] with a bachelor of science in nursing. 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 She has been married for 27 years, and has 2 children, a 2 year old son, and a 31 year old daughter. She denies any legal difficulties. She associates as a Engineer, manufacturing.      Allergies  Allergen Reactions  . Ace Inhibitors     cough     Outpatient Medications Prior to Visit  Medication Sig Dispense Refill  . albuterol (PROAIR HFA) 108 (90 Base) MCG/ACT inhaler INHALE 1 PUFF BY MOUTH EVERY 6 HOURS AS NEEDED FOR WHEEZING OR SHORTNESS OF BREATH 8.5 g 6  . albuterol (PROVENTIL) (2.5 MG/3ML) 0.083% nebulizer solution Inhale 3 mLs into the lungs 4 (four) times daily as needed.  9  . bisoprolol (ZEBETA) 5 MG tablet TAKE 1 TABLET (5 MG TOTAL) BY MOUTH DAILY. 30 tablet 2  . calcium carbonate (OS-CAL - DOSED IN MG OF ELEMENTAL  CALCIUM) 1250 (500 Ca) MG tablet Take 1 tablet by mouth.    . cholecalciferol (VITAMIN D) 1000 units tablet Take 1,000 Units by mouth daily.    . fluticasone (FLONASE) 50 MCG/ACT nasal spray Place 2 sprays into both nostrils daily. 16 g 5  . gabapentin (NEURONTIN) 300 MG capsule Take 1 capsule (300 mg total) by mouth 3 (three) times daily. One twice daily (Patient taking differently: Take 300 mg by mouth at bedtime as needed. One twice daily) 90 capsule 3  . IBRANCE 125 MG capsule TAKE 1 CAPSULE BY MOUTH ONCE DAILY WITH BREAKFAST. TAKE WHOLE WITH FOOD. 21 capsule 6  . ibuprofen (ADVIL,MOTRIN) 200 MG tablet Take 200 mg by mouth every 6 (six) hours as needed.    Marland Kitchen letrozole (FEMARA) 2.5 MG tablet TAKE 1 TABLET BY MOUTH DAILY. 90 tablet 3  .  loratadine (CLARITIN) 10 MG tablet Take 1 tablet (10 mg total) by mouth daily. 30 tablet 5  . LORazepam (ATIVAN) 2 MG tablet TAKE 1/2 TO 1 TABLET BY MOUTH EACH NIGHT AT BEDTIME 30 tablet 3  . prochlorperazine (COMPAZINE) 10 MG tablet Take 1 tablet (10 mg total) by mouth every 6 (six) hours as needed for nausea or vomiting. 30 tablet 0  . promethazine (PHENERGAN) 25 MG tablet Take 1 tablet (25 mg total) by mouth every 6 (six) hours as needed for nausea. 30 tablet 3   No facility-administered medications prior to visit.         Objective:   Physical Exam  Vitals:   07/11/17 1220  BP: 102/64  Pulse: 95  SpO2: 93%  Weight: 185 lb (83.9 kg)  Height: 5\' 5"  (1.651 m)   Gen: Pleasant, well-nourished, in no distress,  normal affect  ENT: No lesions,  mouth clear,  oropharynx clear, no postnasal drip  Neck: No JVD, no stridor  Lungs: No use of accessory muscles, scattered rhonchi bilaterally, no wheezing  Cardiovascular: RRR, heart sounds normal, no murmur or gallops, no peripheral edema  Musculoskeletal: No deformities, no cyanosis or clubbing  Neuro: alert, non focal  Skin: Warm, no lesions or rashes    Assessment & Plan:  Cough variant  asthma Complex case cough and dyspnea given her history of metastatic breast cancer with right hilar prominence and possible right sided airway narrowing.  No obvious endobronchial lesion but this is a consideration.  I tried treating her for common causes of upper airway irritation including GERD and rhinitis.  She continues to have the same cough and dyspnea.  Her pulmonary function testing has shown mixed obstruction and restriction.  I believe would be reasonable to try and treat her for obstructive lung disease to see if her symptoms improve.  She has a repeat PET scan planned for the beginning of 2019.  If there is any evidence of active disease in the right hilum then she will need bronchoscopy to visualize and to get a tissue diagnosis.  She would be a candidate for palliative radiation therapy to the airway if such a lesion were found.  Please start Spiriva respimat 2 puffs once a day for the next 3-4 weeks. If you benefit from this call our office and we will continue it Continue to use albuterol nebulized or inhaler up to every 4 hours if needed for shortness of breath or chest tightness.  Continue your fluticasone nasal spray and loratadine-D as you are taking them.  We will hold off on bronchoscopy for now. We will review your next PET scan together and with oncology and decide whether there may be benefit from airway inspection.  Follow with Dr Lamonte Sakai in 3 months or sooner if you have any problems.  Baltazar Apo, MD, PhD 07/12/2017, 5:17 PM [facility] [phone] or if no answer [phone]

## 2017-07-12 NOTE — Assessment & Plan Note (Signed)
Complex case cough and dyspnea given her history of metastatic breast cancer with right hilar prominence and possible right sided airway narrowing.  No obvious endobronchial lesion but this is a consideration.  I tried treating her for common causes of upper airway irritation including GERD and rhinitis.  She continues to have the same cough and dyspnea.  Her pulmonary function testing has shown mixed obstruction and restriction.  I believe would be reasonable to try and treat her for obstructive lung disease to see if her symptoms improve.  She has a repeat PET scan planned for the beginning of 2019.  If there is any evidence of active disease in the right hilum then she will need bronchoscopy to visualize and to get a tissue diagnosis.  She would be a candidate for palliative radiation therapy to the airway if such a lesion were found.  Please start Spiriva respimat 2 puffs once a day for the next 3-4 weeks. If you benefit from this call our office and we will continue it Continue to use albuterol nebulized or inhaler up to every 4 hours if needed for shortness of breath or chest tightness.  Continue your fluticasone nasal spray and loratadine-D as you are taking them.  We will hold off on bronchoscopy for now. We will review your next PET scan together and with oncology and decide whether there may be benefit from airway inspection.  Follow with Dr Lamonte Sakai in 3 months or sooner if you have any problems.

## 2017-07-15 DIAGNOSIS — J9611 Chronic respiratory failure with hypoxia: Secondary | ICD-10-CM | POA: Diagnosis not present
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
## 2017-08-07 ENCOUNTER — Ambulatory Visit: Payer: Medicare Other

## 2017-08-07 ENCOUNTER — Other Ambulatory Visit: Payer: Medicare Other

## 2017-08-07 ENCOUNTER — Ambulatory Visit: Payer: Self-pay

## 2017-08-07 ENCOUNTER — Ambulatory Visit: Payer: Medicare Other | Admitting: Hematology and Oncology

## 2017-08-07 MED ORDER — DENOSUMAB 120 MG/1.7ML ~~LOC~~ SOLN
SUBCUTANEOUS | Status: AC
  Filled 2017-08-07: qty 1.7

## 2017-08-07 MED ORDER — FULVESTRANT 250 MG/5ML IM SOLN
INTRAMUSCULAR | Status: AC
  Filled 2017-08-07: qty 10
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
## 2017-08-09 MED FILL — SPIRIVA RESPIMAT INHAL SPRY: 2.5 | 30 days supply | Qty: 4 | Fill #1

## 2017-08-09 MED FILL — ALBUTEROL 0.083% INHAL SOLN: (2.5 MG/3ML | 7 days supply | Qty: 90 | Fill #6

## 2017-08-09 MED FILL — PROAIR HFA 90 MCG INHALER: 108 (90 BAS | 50 days supply | Qty: 9 | Fill #1
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
## 2017-08-14 ENCOUNTER — Other Ambulatory Visit: Payer: Medicare Other

## 2017-08-14 ENCOUNTER — Telehealth: Payer: Self-pay | Admitting: Hematology and Oncology

## 2017-08-14 ENCOUNTER — Ambulatory Visit: Payer: Medicare Other | Admitting: Hematology and Oncology

## 2017-08-14 ENCOUNTER — Ambulatory Visit: Payer: Medicare Other

## 2017-08-14 MED ORDER — DENOSUMAB 120 MG/1.7ML ~~LOC~~ SOLN
SUBCUTANEOUS | Status: AC
  Filled 2017-08-14: qty 1.7

## 2017-08-14 MED ORDER — FULVESTRANT 250 MG/5ML IM SOLN
INTRAMUSCULAR | Status: AC
  Filled 2017-08-14: qty 10

## 2017-08-14 NOTE — Telephone Encounter (Signed)
R/s appt per 1/2 sch message - patient is aware of appt date and time.

## 2017-08-15 DIAGNOSIS — J9611 Chronic respiratory failure with hypoxia: Secondary | ICD-10-CM | POA: Diagnosis not present
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
## 2017-08-16 ENCOUNTER — Inpatient Hospital Stay: Payer: Medicare Other | Attending: Hematology and Oncology | Admitting: Hematology and Oncology

## 2017-08-16 ENCOUNTER — Ambulatory Visit (HOSPITAL_BASED_OUTPATIENT_CLINIC_OR_DEPARTMENT_OTHER): Payer: Medicare Other

## 2017-08-16 ENCOUNTER — Telehealth: Payer: Self-pay | Admitting: Hematology and Oncology

## 2017-08-16 ENCOUNTER — Other Ambulatory Visit: Payer: Self-pay

## 2017-08-16 ENCOUNTER — Other Ambulatory Visit (HOSPITAL_BASED_OUTPATIENT_CLINIC_OR_DEPARTMENT_OTHER): Payer: Medicare Other

## 2017-08-16 DIAGNOSIS — Z9981 Dependence on supplemental oxygen: Secondary | ICD-10-CM | POA: Insufficient documentation

## 2017-08-16 DIAGNOSIS — D701 Agranulocytosis secondary to cancer chemotherapy: Secondary | ICD-10-CM

## 2017-08-16 DIAGNOSIS — Z9011 Acquired absence of right breast and nipple: Secondary | ICD-10-CM | POA: Insufficient documentation

## 2017-08-16 DIAGNOSIS — C50511 Malignant neoplasm of lower-outer quadrant of right female breast: Secondary | ICD-10-CM | POA: Diagnosis not present

## 2017-08-16 DIAGNOSIS — Z5111 Encounter for antineoplastic chemotherapy: Secondary | ICD-10-CM | POA: Diagnosis not present

## 2017-08-16 DIAGNOSIS — C787 Secondary malignant neoplasm of liver and intrahepatic bile duct: Secondary | ICD-10-CM

## 2017-08-16 DIAGNOSIS — R5383 Other fatigue: Secondary | ICD-10-CM | POA: Diagnosis not present

## 2017-08-16 DIAGNOSIS — Z170 Estrogen receptor positive status [ER+]: Secondary | ICD-10-CM | POA: Diagnosis not present

## 2017-08-16 DIAGNOSIS — Z79899 Other long term (current) drug therapy: Secondary | ICD-10-CM | POA: Insufficient documentation

## 2017-08-16 DIAGNOSIS — Z923 Personal history of irradiation: Secondary | ICD-10-CM | POA: Insufficient documentation

## 2017-08-16 DIAGNOSIS — R918 Other nonspecific abnormal finding of lung field: Secondary | ICD-10-CM | POA: Insufficient documentation

## 2017-08-16 DIAGNOSIS — Z9223 Personal history of estrogen therapy: Secondary | ICD-10-CM | POA: Insufficient documentation

## 2017-08-16 DIAGNOSIS — Z9221 Personal history of antineoplastic chemotherapy: Secondary | ICD-10-CM | POA: Insufficient documentation

## 2017-08-16 DIAGNOSIS — C7951 Secondary malignant neoplasm of bone: Secondary | ICD-10-CM | POA: Insufficient documentation

## 2017-08-16 LAB — COMPREHENSIVE METABOLIC PANEL
ALBUMIN: 3 g/dL — AB (ref 3.5–5.0)
ALK PHOS: 114 U/L (ref 40–150)
ALT: 38 U/L (ref 0–55)
AST: 109 U/L — ABNORMAL HIGH (ref 5–34)
Anion Gap: 6 mEq/L (ref 3–11)
BILIRUBIN TOTAL: 1.56 mg/dL — AB (ref 0.20–1.20)
BUN: 8.3 mg/dL (ref 7.0–26.0)
CO2: 32 meq/L — AB (ref 22–29)
Calcium: 8.8 mg/dL (ref 8.4–10.4)
Chloride: 100 mEq/L (ref 98–109)
Creatinine: 0.8 mg/dL (ref 0.6–1.1)
Glucose: 119 mg/dl (ref 70–140)
Potassium: 3.6 mEq/L (ref 3.5–5.1)
Sodium: 138 mEq/L (ref 136–145)
TOTAL PROTEIN: 7.5 g/dL (ref 6.4–8.3)

## 2017-08-16 LAB — CBC WITH DIFFERENTIAL/PLATELET
BASO%: 2.1 % — AB (ref 0.0–2.0)
Basophils Absolute: 0.1 10*3/uL (ref 0.0–0.1)
EOS%: 1.8 % (ref 0.0–7.0)
Eosinophils Absolute: 0.1 10*3/uL (ref 0.0–0.5)
HEMATOCRIT: 38.5 % (ref 34.8–46.6)
HEMOGLOBIN: 13.3 g/dL (ref 11.6–15.9)
LYMPH#: 0.9 10*3/uL (ref 0.9–3.3)
LYMPH%: 25.1 % (ref 14.0–49.7)
MCH: 43.2 pg — ABNORMAL HIGH (ref 25.1–34.0)
MCHC: 34.5 g/dL (ref 31.5–36.0)
MCV: 125 fL — ABNORMAL HIGH (ref 79.5–101.0)
MONO#: 0.3 10*3/uL (ref 0.1–0.9)
MONO%: 7.4 % (ref 0.0–14.0)
NEUT#: 2.2 10*3/uL (ref 1.5–6.5)
NEUT%: 63.6 % (ref 38.4–76.8)
Platelets: 125 10*3/uL — ABNORMAL LOW (ref 145–400)
RBC: 3.08 10*6/uL — ABNORMAL LOW (ref 3.70–5.45)
RDW: 13.7 % (ref 11.2–14.5)
WBC: 3.4 10*3/uL — ABNORMAL LOW (ref 3.9–10.3)

## 2017-08-16 MED ORDER — FULVESTRANT 250 MG/5ML IM SOLN
500.0000 mg | Freq: Once | INTRAMUSCULAR | Status: AC
  Administered 2017-08-16: 500 mg via INTRAMUSCULAR

## 2017-08-16 MED ORDER — ZOLPIDEM TARTRATE ER 12.5 MG PO TBCR: 12.5000 mg | EXTENDED_RELEASE_TABLET | Freq: Every evening | ORAL | 5 refills | Status: DC | PRN

## 2017-08-16 MED ORDER — LORAZEPAM 2 MG PO TABS: ORAL_TABLET | ORAL | 5 refills | Status: DC

## 2017-08-16 NOTE — Telephone Encounter (Signed)
Per 1/4 sch message - central radiology to contact patient with PET scan .
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
## 2017-08-19 ENCOUNTER — Other Ambulatory Visit: Payer: Self-pay

## 2017-08-19 ENCOUNTER — Encounter: Payer: Self-pay | Admitting: Hematology and Oncology

## 2017-08-19 MED ORDER — TRAZODONE HCL 50 MG PO TABS: 50.0000 mg | ORAL_TABLET | Freq: Every day | ORAL | 0 refills | Status: DC

## 2017-08-19 MED FILL — traZODone HCL 50 MG TABS: 50 | 30 days supply | Qty: 30 | Fill #0

## 2017-08-19 NOTE — Progress Notes (Signed)
Received PA determination for Zolpidem Tartrate.  PA denied.  Denial given to physician with denial reason and approved formulary list. He verbalized understanding.

## 2017-08-19 NOTE — Progress Notes (Signed)
Received PA request for Zolpidem Tartrate.  Submitted via Cover My Meds:   Suzanne [name] Key: [id number] - PA Case ID: [id number] Need help? Call us at [phone]  Status  Sent to [employer]

## 2017-08-20 ENCOUNTER — Encounter (HOSPITAL_COMMUNITY)
Admission: RE | Admit: 2017-08-20 | Discharge: 2017-08-20 | Disposition: A | Discharge status: Home / Self Care | Payer: Medicare Other | Source: Ambulatory Visit | Attending: Hematology and Oncology | Admitting: Hematology and Oncology

## 2017-08-20 ENCOUNTER — Telehealth: Payer: Self-pay | Admitting: Hematology and Oncology

## 2017-08-20 DIAGNOSIS — C50911 Malignant neoplasm of unspecified site of right female breast: Secondary | ICD-10-CM | POA: Diagnosis not present

## 2017-08-20 DIAGNOSIS — C50511 Malignant neoplasm of lower-outer quadrant of right female breast: Secondary | ICD-10-CM | POA: Insufficient documentation

## 2017-08-20 DIAGNOSIS — Z170 Estrogen receptor positive status [ER+]: Secondary | ICD-10-CM | POA: Diagnosis not present

## 2017-08-20 LAB — GLUCOSE, CAPILLARY: Glucose-Capillary: 124 mg/dL — ABNORMAL HIGH (ref 65–99)

## 2017-08-20 MED ORDER — FLUDEOXYGLUCOSE F - 18 (FDG) INJECTION
9.1000 | Freq: Once | INTRAVENOUS | Status: AC | PRN
  Administered 2017-08-20: 9.1 via INTRAVENOUS

## 2017-08-20 NOTE — Telephone Encounter (Signed)
Patient didn't want to schedule at times available she wanted afternoon. The nurse said she would call her if something opened up for the afternoon other than that Dr would call patient.

## 2017-08-21 ENCOUNTER — Telehealth: Payer: Self-pay | Admitting: Hematology and Oncology

## 2017-08-21 NOTE — Telephone Encounter (Signed)
Left message re 1/10 f/u at 3pm.
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
## 2017-08-22 ENCOUNTER — Telehealth: Payer: Self-pay | Admitting: Hematology and Oncology

## 2017-08-22 ENCOUNTER — Inpatient Hospital Stay (HOSPITAL_BASED_OUTPATIENT_CLINIC_OR_DEPARTMENT_OTHER): Payer: Medicare Other | Admitting: Hematology and Oncology

## 2017-08-22 VITALS — BP 106/78 | HR 94 | Temp 97.5°F | Resp 16 | Ht 65.0 in | Wt 184.1 lb

## 2017-08-22 DIAGNOSIS — Z9221 Personal history of antineoplastic chemotherapy: Secondary | ICD-10-CM

## 2017-08-22 DIAGNOSIS — C787 Secondary malignant neoplasm of liver and intrahepatic bile duct: Secondary | ICD-10-CM | POA: Diagnosis not present

## 2017-08-22 DIAGNOSIS — Z79899 Other long term (current) drug therapy: Secondary | ICD-10-CM | POA: Diagnosis not present

## 2017-08-22 DIAGNOSIS — Z9223 Personal history of estrogen therapy: Secondary | ICD-10-CM | POA: Diagnosis not present

## 2017-08-22 DIAGNOSIS — Z923 Personal history of irradiation: Secondary | ICD-10-CM | POA: Diagnosis not present

## 2017-08-22 DIAGNOSIS — C7951 Secondary malignant neoplasm of bone: Secondary | ICD-10-CM

## 2017-08-22 DIAGNOSIS — R918 Other nonspecific abnormal finding of lung field: Secondary | ICD-10-CM

## 2017-08-22 DIAGNOSIS — Z9981 Dependence on supplemental oxygen: Secondary | ICD-10-CM

## 2017-08-22 DIAGNOSIS — Z170 Estrogen receptor positive status [ER+]: Secondary | ICD-10-CM

## 2017-08-22 DIAGNOSIS — Z9011 Acquired absence of right breast and nipple: Secondary | ICD-10-CM

## 2017-08-22 DIAGNOSIS — C50511 Malignant neoplasm of lower-outer quadrant of right female breast: Secondary | ICD-10-CM | POA: Diagnosis not present

## 2017-08-22 MED ORDER — DENOSUMAB 120 MG/1.7ML ~~LOC~~ SOLN
120.0000 mg | Freq: Once | SUBCUTANEOUS | Status: AC
  Administered 2017-08-22: 120 mg via SUBCUTANEOUS

## 2017-08-22 MED ORDER — DENOSUMAB 120 MG/1.7ML ~~LOC~~ SOLN
SUBCUTANEOUS | Status: AC
  Filled 2017-08-22: qty 1.7

## 2017-08-22 NOTE — Telephone Encounter (Signed)
Gave patient AVs and calendar of upcoming January through April appointments. April appointments scheduled per patient request.
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
## 2017-09-02 ENCOUNTER — Telehealth: Payer: Self-pay | Admitting: Pharmacist

## 2017-09-02 DIAGNOSIS — C50511 Malignant neoplasm of lower-outer quadrant of right female breast: Secondary | ICD-10-CM

## 2017-09-02 DIAGNOSIS — Z170 Estrogen receptor positive status [ER+]: Principal | ICD-10-CM

## 2017-09-02 MED ORDER — PALBOCICLIB 125 MG PO CAPS: 125.0000 mg | ORAL_CAPSULE | Freq: Every day | ORAL | 6 refills | Status: DC

## 2017-09-02 NOTE — Telephone Encounter (Signed)
Oral Chemotherapy Pharmacist Encounter  Follow-Up Form  Spoke with patient today to follow up regarding patient's oral chemotherapy medication: Ibrance (palbociclib) for the treatment of metastatic, hormone receptor positive breast cancer in conjunction with Femara, planned duration until disease progression or unacceptable toxicity.  Original Start date of oral chemotherapy: 05/01/16  Pt is doing well today  Pt reports 0 doses of Ibrance 125mg  capsules, 1 capsule by mouth once daily, taken with breakfast for 21 days on, 7 days off, repeated every 28 days missed in the last month.  Patient states she uses a calendar to help her remember to re-start her cycle.  Pt reports the following side effects:   Mild nausea, on a daily basis, does remit on week off. Managed with anti-emetics some days, however patient doesn't care to take medication daily to manage side effects. She will try some ginger drops to help with sypmtoms.  Mild fatigue since starting Ibrance that does not remit on week off. This is manageable. Some times patient will take naps if she is very tired.  Patient is willing to remain on Ibrance therapy.  Pertinent labs reviewed: OK for continued treatment. Noted stable elevated bilirubin and mild thrombocytopenia. These will continue to be monitored..  Other Issues: Patient now with copayment assistance grant, to fill Ibrance at the [facility].. New prescription has been e-scribed. Patient will pick up her next cycle on 09/04/17.  Patient knows to call the office with questions or concerns. Oral Oncology Clinic will continue to follow.  Thank you,  Johny Drilling, PharmD, BCPS, BCOP 09/02/2017 4:38 PM Oral Oncology Clinic [phone]

## 2017-09-03 ENCOUNTER — Encounter: Payer: Self-pay | Admitting: Physician Assistant

## 2017-09-03 ENCOUNTER — Other Ambulatory Visit: Payer: Self-pay

## 2017-09-03 MED ORDER — ZOLPIDEM TARTRATE ER 12.5 MG PO TBCR: 12.5000 mg | EXTENDED_RELEASE_TABLET | Freq: Every evening | ORAL | 0 refills | Status: DC | PRN

## 2017-09-03 NOTE — Telephone Encounter (Signed)
[name]  Patient has been enrolled with [employer] for copay assistance.    The [place] application has been placed on hold and filed in the event it is needed in the future.    Fabio Asa. Melynda Keller, [place] Patient [place] [phone] 09/03/2017 10:08 AM

## 2017-09-04 ENCOUNTER — Encounter: Payer: Self-pay | Admitting: Hematology and Oncology

## 2017-09-04 NOTE — Progress Notes (Signed)
Submitted auth request for Zolpidem Tartrate ER and it was denied.  Gave denial to the nurse.

## 2017-09-09 ENCOUNTER — Other Ambulatory Visit: Payer: Self-pay | Admitting: Hematology and Oncology

## 2017-09-09 DIAGNOSIS — C50511 Malignant neoplasm of lower-outer quadrant of right female breast: Secondary | ICD-10-CM

## 2017-09-09 MED FILL — ALBUTEROL 0.083% INHAL SOLN: (2.5 MG/3ML | 7 days supply | Qty: 90 | Fill #0

## 2017-09-09 MED FILL — LETROZOLE 2.5 MG TAB: 2.5 | 90 days supply | Qty: 90 | Fill #0

## 2017-09-11 ENCOUNTER — Ambulatory Visit: Payer: Self-pay

## 2017-09-11 MED ORDER — FULVESTRANT 250 MG/5ML IM SOLN
INTRAMUSCULAR | Status: AC
  Filled 2017-09-11: qty 10

## 2017-09-13 MED FILL — SPIRIVA RESPIMAT INHAL SPRY: 2.5 | 30 days supply | Qty: 4 | Fill #2

## 2017-09-15 DIAGNOSIS — J9611 Chronic respiratory failure with hypoxia: Secondary | ICD-10-CM | POA: Diagnosis not present

## 2017-09-16 ENCOUNTER — Inpatient Hospital Stay: Payer: Medicare Other | Attending: Hematology and Oncology

## 2017-09-16 DIAGNOSIS — C50511 Malignant neoplasm of lower-outer quadrant of right female breast: Secondary | ICD-10-CM | POA: Diagnosis not present

## 2017-09-16 DIAGNOSIS — C7951 Secondary malignant neoplasm of bone: Secondary | ICD-10-CM | POA: Diagnosis not present

## 2017-09-16 DIAGNOSIS — Z5111 Encounter for antineoplastic chemotherapy: Secondary | ICD-10-CM | POA: Diagnosis not present

## 2017-09-16 DIAGNOSIS — C787 Secondary malignant neoplasm of liver and intrahepatic bile duct: Secondary | ICD-10-CM | POA: Insufficient documentation

## 2017-09-16 MED ORDER — FULVESTRANT 250 MG/5ML IM SOLN
500.0000 mg | Freq: Once | INTRAMUSCULAR | Status: AC
  Administered 2017-09-16: 500 mg via INTRAMUSCULAR

## 2017-09-16 MED ORDER — FULVESTRANT 250 MG/5ML IM SOLN
INTRAMUSCULAR | Status: AC
  Filled 2017-09-16: qty 10

## 2017-09-16 MED FILL — LORazepam 2 MG TABS: 2 | 30 days supply | Qty: 30 | Fill #0

## 2017-09-16 MED FILL — IBRANCE 125 MG CAPSULE: 125 | 28 days supply | Qty: 21 | Fill #0

## 2017-09-16 MED FILL — PROAIR HFA 90 MCG INHALER: 108 (90 BAS | 25 days supply | Qty: 9 | Fill #2

## 2017-09-16 NOTE — Patient Instructions (Signed)
Fulvestrant injection What is this medicine? FULVESTRANT (ful VES trant) blocks the effects of estrogen. It is used to treat breast cancer. This medicine may be used for other purposes; ask your health care provider or pharmacist if you have questions. What should I tell my health care provider before I take this medicine? They need to know if you have any of these conditions: -bleeding problems -liver disease -low levels of platelets in the blood -an unusual or allergic reaction to fulvestrant, other medicines, foods, dyes, or preservatives -pregnant or trying to get pregnant -breast-feeding How should I use this medicine? This medicine is for injection into a muscle. It is usually given by a health care professional in a hospital or clinic setting. Talk to your pediatrician regarding the use of this medicine in children. Special care may be needed. Overdosage: If you think you have taken too much of this medicine contact a poison control center or emergency room at once. NOTE: This medicine is only for you. Do not share this medicine with others. What if I miss a dose? It is important not to miss your dose. Call your doctor or health care professional if you are unable to keep an appointment. What may interact with this medicine? -medicines that treat or prevent blood clots like warfarin, enoxaparin, and dalteparin This list may not describe all possible interactions. Give your health care provider a list of all the medicines, herbs, non-prescription drugs, or dietary supplements you use. Also tell them if you smoke, drink alcohol, or use illegal drugs. Some items may interact with your medicine. What should I watch for while using this medicine? Your condition will be monitored carefully while you are receiving this medicine. You will need important blood work done while you are taking this medicine. Do not become pregnant while taking this medicine or for at least 1 year after stopping  it. Women of child-bearing potential will need to have a negative pregnancy test before starting this medicine. Women should inform their doctor if they wish to become pregnant or think they might be pregnant. There is a potential for serious side effects to an unborn child. Men should inform their doctors if they wish to father a child. This medicine may lower sperm counts. Talk to your health care professional or pharmacist for more information. Do not breast-feed an infant while taking this medicine or for 1 year after the last dose. What side effects may I notice from receiving this medicine? Side effects that you should report to your doctor or health care professional as soon as possible: -allergic reactions like skin rash, itching or hives, swelling of the face, lips, or tongue -feeling faint or lightheaded, falls -pain, tingling, numbness, or weakness in the legs -signs and symptoms of infection like fever or chills; cough; flu-like symptoms; sore throat -vaginal bleeding Side effects that usually do not require medical attention (report to your doctor or health care professional if they continue or are bothersome): -aches, pains -constipation -diarrhea -headache -hot flashes -nausea, vomiting -pain at site where injected -stomach pain This list may not describe all possible side effects. Call your doctor for medical advice about side effects. You may report side effects to FDA at 1-800-FDA-1088. Where should I keep my medicine? This drug is given in a hospital or clinic and will not be stored at home. NOTE: This sheet is a summary. It may not cover all possible information. If you have questions about this medicine, talk to your doctor, pharmacist, or health   care provider.    2016, Elsevier/Gold Standard. (2015-02-25 11:03:55)  Denosumab injection What is this medicine? DENOSUMAB (den oh sue mab) slows bone breakdown. Prolia is used to treat osteoporosis in women after menopause  and in men. Xgeva is used to prevent bone fractures and other bone problems caused by cancer bone metastases. Xgeva is also used to treat giant cell tumor of the bone. This medicine may be used for other purposes; ask your health care provider or pharmacist if you have questions. What should I tell my health care provider before I take this medicine? They need to know if you have any of these conditions: -dental disease -eczema -infection or history of infections -kidney disease or on dialysis -low blood calcium or vitamin D -malabsorption syndrome -scheduled to have surgery or tooth extraction -taking medicine that contains denosumab -thyroid or parathyroid disease -an unusual reaction to denosumab, other medicines, foods, dyes, or preservatives -pregnant or trying to get pregnant -breast-feeding How should I use this medicine? This medicine is for injection under the skin. It is given by a health care professional in a hospital or clinic setting. If you are getting Prolia, a special MedGuide will be given to you by the pharmacist with each prescription and refill. Be sure to read this information carefully each time. For Prolia, talk to your pediatrician regarding the use of this medicine in children. Special care may be needed. For Xgeva, talk to your pediatrician regarding the use of this medicine in children. While this drug may be prescribed for children as young as 13 years for selected conditions, precautions do apply. Overdosage: If you think you have taken too much of this medicine contact a poison control center or emergency room at once. NOTE: This medicine is only for you. Do not share this medicine with others. What if I miss a dose? It is important not to miss your dose. Call your doctor or health care professional if you are unable to keep an appointment. What may interact with this medicine? Do not take this medicine with any of the following medications: -other medicines  containing denosumab This medicine may also interact with the following medications: -medicines that suppress the immune system -medicines that treat cancer -steroid medicines like prednisone or cortisone This list may not describe all possible interactions. Give your health care provider a list of all the medicines, herbs, non-prescription drugs, or dietary supplements you use. Also tell them if you smoke, drink alcohol, or use illegal drugs. Some items may interact with your medicine. What should I watch for while using this medicine? Visit your doctor or health care professional for regular checks on your progress. Your doctor or health care professional may order blood tests and other tests to see how you are doing. Call your doctor or health care professional if you get a cold or other infection while receiving this medicine. Do not treat yourself. This medicine may decrease your body's ability to fight infection. You should make sure you get enough calcium and vitamin D while you are taking this medicine, unless your doctor tells you not to. Discuss the foods you eat and the vitamins you take with your health care professional. See your dentist regularly. Brush and floss your teeth as directed. Before you have any dental work done, tell your dentist you are receiving this medicine. Do not become pregnant while taking this medicine or for 5 months after stopping it. Women should inform their doctor if they wish to become pregnant or think   they might be pregnant. There is a potential for serious side effects to an unborn child. Talk to your health care professional or pharmacist for more information. What side effects may I notice from receiving this medicine? Side effects that you should report to your doctor or health care professional as soon as possible: -allergic reactions like skin rash, itching or hives, swelling of the face, lips, or tongue -breathing problems -chest pain -fast,  irregular heartbeat -feeling faint or lightheaded, falls -fever, chills, or any other sign of infection -muscle spasms, tightening, or twitches -numbness or tingling -skin blisters or bumps, or is dry, peels, or red -slow healing or unexplained pain in the mouth or jaw -unusual bleeding or bruising Side effects that usually do not require medical attention (Report these to your doctor or health care professional if they continue or are bothersome.): -muscle pain -stomach upset, gas This list may not describe all possible side effects. Call your doctor for medical advice about side effects. You may report side effects to FDA at 1-800-FDA-1088. Where should I keep my medicine? This medicine is only given in a clinic, doctor's office, or other health care setting and will not be stored at home. NOTE: This sheet is a summary. It may not cover all possible information. If you have questions about this medicine, talk to your doctor, pharmacist, or health care provider.    2016, Elsevier/Gold Standard. (2012-01-28 12:37:47)   

## 2017-09-24 ENCOUNTER — Telehealth: Payer: Self-pay | Admitting: Pharmacy Technician

## 2017-09-24 NOTE — Telephone Encounter (Signed)
[name]  Patient has been approved for copay assistance with [employer] ([employer]).    Current enrollment expires on 08/12/2018.  The [place] will cover all copayment expenses for Ibrance for the remainder of the calendar year.    The billing information is as follows and has been shared with [place].   Member ID: [id number] Group ID: [id number] PCN: [id number] BIN: [id number]    Suzanne [name]. Melynda Keller, [place] Patient [place] [phone] 09/24/2017 3:00 PM

## 2017-10-07 DIAGNOSIS — M542 Cervicalgia: Secondary | ICD-10-CM | POA: Diagnosis not present

## 2017-10-07 MED FILL — oxyCODONE HCL 5 MG TABS: 5 | 10 days supply | Qty: 40 | Fill #0

## 2017-10-08 MED FILL — IBRANCE 125 MG CAPSULE: 125 | 28 days supply | Qty: 21 | Fill #1

## 2017-10-08 MED FILL — BISOPROLOL FUMARATE 5 MG TA: 5 | 90 days supply | Qty: 90 | Fill #3

## 2017-10-09 ENCOUNTER — Ambulatory Visit: Payer: Self-pay

## 2017-10-09 ENCOUNTER — Ambulatory Visit: Payer: Self-pay | Admitting: Emergency Medicine

## 2017-10-10 ENCOUNTER — Inpatient Hospital Stay: Payer: Medicare Other

## 2017-10-10 ENCOUNTER — Encounter: Payer: Self-pay | Admitting: Emergency Medicine

## 2017-10-10 ENCOUNTER — Ambulatory Visit: Payer: Medicare Other | Admitting: Emergency Medicine

## 2017-10-10 DIAGNOSIS — C50511 Malignant neoplasm of lower-outer quadrant of right female breast: Secondary | ICD-10-CM

## 2017-10-10 DIAGNOSIS — J9611 Chronic respiratory failure with hypoxia: Secondary | ICD-10-CM

## 2017-10-10 DIAGNOSIS — C7951 Secondary malignant neoplasm of bone: Secondary | ICD-10-CM | POA: Diagnosis not present

## 2017-10-10 DIAGNOSIS — J449 Chronic obstructive pulmonary disease, unspecified: Secondary | ICD-10-CM

## 2017-10-10 DIAGNOSIS — Z5111 Encounter for antineoplastic chemotherapy: Secondary | ICD-10-CM | POA: Diagnosis not present

## 2017-10-10 DIAGNOSIS — Z170 Estrogen receptor positive status [ER+]: Secondary | ICD-10-CM | POA: Diagnosis not present

## 2017-10-10 DIAGNOSIS — C787 Secondary malignant neoplasm of liver and intrahepatic bile duct: Secondary | ICD-10-CM | POA: Diagnosis not present

## 2017-10-10 MED ORDER — FULVESTRANT 250 MG/5ML IM SOLN
INTRAMUSCULAR | Status: AC
  Filled 2017-10-10: qty 10

## 2017-10-10 MED ORDER — FLUTICASONE-UMECLIDIN-VILANT 100-62.5-25 MCG/INH IN AEPB: 1.0000 | INHALATION_SPRAY | Freq: Every day | RESPIRATORY_TRACT | 3 refills | Status: DC

## 2017-10-10 MED ORDER — FULVESTRANT 250 MG/5ML IM SOLN
500.0000 mg | Freq: Once | INTRAMUSCULAR | Status: AC
  Administered 2017-10-10: 500 mg via INTRAMUSCULAR

## 2017-10-10 MED FILL — PROAIR HFA 90 MCG INHALER: 108 (90 BAS | 25 days supply | Qty: 9 | Fill #3

## 2017-10-10 NOTE — Progress Notes (Signed)
Subjective:    Patient ID: Suzanne [name], female    DOB: 1956-05-14, 62 y.o.   MRN: [id number]  Cough  Associated symptoms include shortness of breath. Pertinent negatives include no ear pain, eye redness, fever, headaches, postnasal drip, rash, rhinorrhea, sore throat or wheezing.   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  She is undergone right mastectomy and reconstruction, chemotherapy, targeted right axillary radiation.  She has been followed in our office for possible cough variant asthma.  She recently also saw Dr. [name] Gaskins.  She has been having significant increase in cough, copious secretions that are worst in the am, clear mucous to white. Also has some drainage from her head / sinuses. Happens again 3-4 pm. Her most recent imaging includes a PET scan from 05/02/17 that I have reviewed.  This shows some progressive foci of right lower lobe and right middle lobe atelectasis, right-sided pleural thickening and effusion without any clear hypermetabolism.  She currently has albuterol to use as needed. Uses about 1-2x a day. Feels that it helps sometimes. She hsa PFT ordered for next week. She has a lot of rhinitis, not on meds currently  ROV 07/11/17 --this is a follow-up visit for 62 year old woman, never smoker with history of stage IV breast cancer involving the left hilum, subpleural region, liver, bone.  She was seen for cough.  Imaging shows an elevated right hemidiaphragm and some pleural thickening, right hilar prominence and an endobronchial lesion was considered.  Bronchoscopy has not yet been done.  We try to treat her for standard contributors to cough including rhinitis, continued GERD therapy.  She underwent pulmonary function testing on 07/02/17 that I have personally reviewed.  This shows evidence for mixed obstruction and restriction without a bronchodilator response.  Significantly  decreased total lung capacity and a decreased diffusion capacity that corrects for alveolar volume. She has benefited some, especially from the claritin D. Unclear whether she has benefited from nasal steroid. She is dyspneic, hears wheezing. She is planning for a repeat PET scan beginning of 2019  ROV 10/10/17 --this is a follow-up visit for patient with a history of stage IV breast cancer involving the left hilum, subpleural region liver, bone.  I've seen her for cough.  Imaging shows a chronically elevated right hemidiaphragm with some pleural thickening.  She had pulmonary function testing that showed mixed obstruction and restriction.  We tried empiric Spiriva Respimat to see if she would benefit.  Her repeat PET scan was done on 08/20/17 and I have reviewed.  There may be some increased right apical groundglass infiltrate.  Right hemidiaphragm is elevated with some mild basilar atelectasis.  I do not see any evidence of a right hilar endobronchial lesion or airway obstruction.  There were hepatic nodules noted that were hypermetabolic. She is dealing w neck pain, arthritic pain. She remains on chemo with Dr Lindi Adie. She was found to be hypoxemic w exertion on a 6 minute walk. She is wearing 3-4L/min adjusted w exertion. She feels that she may have benefited from the spiriva, but still uses albuterol about 3x a day.  Review of Systems  Constitutional: Positive for appetite change and unexpected weight change. Negative for fever.  HENT: Positive for congestion. Negative for dental problem, ear pain, nosebleeds, postnasal drip, rhinorrhea, sinus pressure, sneezing, sore throat and trouble swallowing.   Eyes: Negative for redness and itching.  Respiratory: Positive for cough and  shortness of breath. Negative for chest tightness and wheezing.   Cardiovascular: Negative for palpitations and leg swelling.  Gastrointestinal: Negative for nausea and vomiting.  Genitourinary: Negative for dysuria.  Musculoskeletal: Negative for joint swelling.  Skin: Negative for rash.  Neurological: Negative for headaches.  Hematological: Does not bruise/bleed easily.  Psychiatric/Behavioral: Negative for dysphoric mood. The patient is not nervous/anxious.     Past Medical History:  Diagnosis Date  . Alcoholism ([place])   . Cancer [facility])    breast ca - 1994; recurred in 2007. s/p masectomy with flap rconstruction aand chemo '94. local recurrence on chest wall. chemo and XRT '07. now femara  . Depression   . Dyspnea    myoview 2011: EF 55% question of  mild reverible anterior defect. thought to be breast attenuation. echo 45-50% with global HK. Grade 1 diastolyic dysfunction. RV nml. cardiac MRI with EF 44% with septal HK. no scar   . History of coronary artery stent placement   . Hyperlipidemia   . Hypertension   . Insomnia   . Lung disorder      Family History  Problem Relation Age of Onset  . Hypertension Mother   . Heart attack Father   . Alcohol abuse Father   . Coronary artery disease Unknown        family hx  . Hyperlipidemia Unknown        family hx  . Thyroid disease Unknown        family hx   . Alcohol abuse Paternal Grandfather   . Alcohol abuse Paternal Uncle   . Cancer Paternal Grandmother        breast  . Alcohol abuse Brother      Social History   Socioeconomic History  . Marital status: Married    Spouse name: Not on file  . Number of children: Not on file  . Years of education: Not on file  . Highest education level: Not on file  Social Needs  . Financial resource strain: Not on file  . Food insecurity - worry: Not on file  . Food insecurity - inability: Not on file  . Transportation needs - medical: Not on file  . Transportation needs -  non-medical: Not on file  Occupational History  . Occupation: Disabilty  Tobacco Use  . Smoking status: Never Smoker  . Smokeless tobacco: Never Used  . Tobacco comment: just socially   Substance and Sexual Activity  . Alcohol use: Yes    Comment: No alcohol since 05/22/2012  . Drug use: No  . Sexual activity: Yes    Birth control/protection: Surgical  Other Topics Concern  . Not on file  Social History Narrative   Krissie was born in [place] and lived there until high school, when she moved to [place]. She graduated from the [employer] with a bachelor of science in nursing. 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 She has been married for 27 years, and has 2 children, a 44 year old son, and a 75 year old daughter. She denies any legal difficulties. She associates as a Engineer, manufacturing.  Allergies  Allergen Reactions  . Ace Inhibitors     cough     Outpatient Medications Prior to Visit  Medication Sig Dispense Refill  . albuterol (PROAIR HFA) 108 (90 Base) MCG/ACT inhaler INHALE 1 PUFF BY MOUTH EVERY 6 HOURS AS NEEDED FOR WHEEZING OR SHORTNESS OF BREATH 8.5 g 6  . albuterol (PROVENTIL) (2.5 MG/3ML) 0.083% nebulizer solution INHALE 1 VIAL VIA NEBULIZER EVERY 6 HOURS AS NEEDED FOR WHEEZING OR SHORTNESS OF BREATH 90 mL 9  . bisoprolol (ZEBETA) 5 MG tablet TAKE 1 TABLET (5 MG TOTAL) BY MOUTH DAILY. 30 tablet 2  . calcium carbonate (OS-CAL - DOSED IN MG OF ELEMENTAL CALCIUM) 1250 (500 Ca) MG tablet Take 1 tablet by mouth.    . cholecalciferol (VITAMIN D) 1000 units tablet Take 1,000 Units by mouth daily.    . fluticasone (FLONASE) 50 MCG/ACT nasal spray Place 2 sprays into both nostrils daily. 16 g 5  . gabapentin (NEURONTIN) 300 MG capsule Take 1 capsule (300 mg total) by mouth 3 (three) times daily. One twice daily (Patient taking differently: Take 300 mg by mouth at bedtime as needed. One twice  daily) 90 capsule 3  . ibuprofen (ADVIL,MOTRIN) 200 MG tablet Take 200 mg by mouth every 6 (six) hours as needed.    Marland Kitchen letrozole (FEMARA) 2.5 MG tablet TAKE 1 TABLET BY MOUTH ONCE DAILY 90 tablet 1  . loratadine (CLARITIN) 10 MG tablet Take 1 tablet (10 mg total) by mouth daily. 30 tablet 5  . LORazepam (ATIVAN) 2 MG tablet TAKE 1/2 TO 1 TABLET BY MOUTH EACH NIGHT AT BEDTIME 30 tablet 5  . palbociclib (IBRANCE) 125 MG capsule Take 1 capsule (125 mg total) by mouth daily with breakfast. Take whole with food for 21 days on, 7 days off, repeat every 28 days. 21 capsule 6  . prochlorperazine (COMPAZINE) 10 MG tablet Take 1 tablet (10 mg total) by mouth every 6 (six) hours as needed for nausea or vomiting. 30 tablet 0  . promethazine (PHENERGAN) 25 MG tablet Take 1 tablet (25 mg total) by mouth every 6 (six) hours as needed for nausea. 30 tablet 3  . Tiotropium Bromide Monohydrate (SPIRIVA RESPIMAT) 2.5 MCG/ACT AERS Inhale 2 puffs into the lungs daily. 1 Inhaler 5  . traZODone (DESYREL) 50 MG tablet Take 1 tablet (50 mg total) by mouth at bedtime. 30 tablet 0  . zolpidem (AMBIEN CR) 12.5 MG CR tablet Take 1 tablet (12.5 mg total) by mouth at bedtime as needed for sleep. 30 tablet 0   No facility-administered medications prior to visit.         Objective:   Physical Exam  Vitals:   10/10/17 1203  BP: 112/76  Pulse: 70  SpO2: 92%  Weight: 186 lb (84.4 kg)  Height: 5\' 5"  (1.651 m)   Gen: Pleasant, well-nourished, in no distress,  normal affect  ENT: No lesions,  mouth clear,  oropharynx clear, no postnasal drip  Neck: No JVD, no stridor  Lungs: No use of accessory muscles, very distant with soft end expiratory wheezes  Cardiovascular: RRR, heart sounds normal, no murmur or gallops, no peripheral edema  Musculoskeletal: No deformities, no cyanosis or clubbing  Neuro: alert, non focal  Skin: Warm, no lesions or rashes    Assessment & Plan:  Chronic hypoxemic respiratory failure  ([place]) She wants to get a portable oxygen concentrator and also get a new DME company.  We will requalify her today, titrate her oxygen needs.  We  will order a portable concentrator for her.  COPD with asthma ([place]) Very severe obstruction noted.  She may have benefited from Spiriva but still uses albuterol frequently.  I like to try her on Trelegy to see if she benefits.  If so then we will order through her pharmacy.  Breast cancer of lower-outer quadrant of right female breast She does have some groundglass infiltrate in her right apex.  Hilar lesions and is on chemotherapy.  I do not see any evidence of an endobronchial lesion on her most recent PET scan.  I do not believe bronchoscopy is indicated at this time.  Baltazar Apo, MD, PhD 08/16/2017, 12:41 PM [facility] [phone] or if no answer [phone]

## 2017-10-10 NOTE — Assessment & Plan Note (Signed)
She does have some groundglass infiltrate in her right apex.  Hilar lesions and is on chemotherapy.  I do not see any evidence of an endobronchial lesion on her most recent PET scan.  I do not believe bronchoscopy is indicated at this time.

## 2017-10-10 NOTE — Assessment & Plan Note (Signed)
Very severe obstruction noted.  She may have benefited from Spiriva but still uses albuterol frequently.  I like to try her on Trelegy to see if she benefits.  If so then we will order through her pharmacy.

## 2017-10-10 NOTE — Patient Instructions (Signed)
Fulvestrant injection What is this medicine? FULVESTRANT (ful VES trant) blocks the effects of estrogen. It is used to treat breast cancer. This medicine may be used for other purposes; ask your health care provider or pharmacist if you have questions. What should I tell my health care provider before I take this medicine? They need to know if you have any of these conditions: -bleeding problems -liver disease -low levels of platelets in the blood -an unusual or allergic reaction to fulvestrant, other medicines, foods, dyes, or preservatives -pregnant or trying to get pregnant -breast-feeding How should I use this medicine? This medicine is for injection into a muscle. It is usually given by a health care professional in a hospital or clinic setting. Talk to your pediatrician regarding the use of this medicine in children. Special care may be needed. Overdosage: If you think you have taken too much of this medicine contact a poison control center or emergency room at once. NOTE: This medicine is only for you. Do not share this medicine with others. What if I miss a dose? It is important not to miss your dose. Call your doctor or health care professional if you are unable to keep an appointment. What may interact with this medicine? -medicines that treat or prevent blood clots like warfarin, enoxaparin, and dalteparin This list may not describe all possible interactions. Give your health care provider a list of all the medicines, herbs, non-prescription drugs, or dietary supplements you use. Also tell them if you smoke, drink alcohol, or use illegal drugs. Some items may interact with your medicine. What should I watch for while using this medicine? Your condition will be monitored carefully while you are receiving this medicine. You will need important blood work done while you are taking this medicine. Do not become pregnant while taking this medicine or for at least 1 year after stopping  it. Women of child-bearing potential will need to have a negative pregnancy test before starting this medicine. Women should inform their doctor if they wish to become pregnant or think they might be pregnant. There is a potential for serious side effects to an unborn child. Men should inform their doctors if they wish to father a child. This medicine may lower sperm counts. Talk to your health care professional or pharmacist for more information. Do not breast-feed an infant while taking this medicine or for 1 year after the last dose. What side effects may I notice from receiving this medicine? Side effects that you should report to your doctor or health care professional as soon as possible: -allergic reactions like skin rash, itching or hives, swelling of the face, lips, or tongue -feeling faint or lightheaded, falls -pain, tingling, numbness, or weakness in the legs -signs and symptoms of infection like fever or chills; cough; flu-like symptoms; sore throat -vaginal bleeding Side effects that usually do not require medical attention (report to your doctor or health care professional if they continue or are bothersome): -aches, pains -constipation -diarrhea -headache -hot flashes -nausea, vomiting -pain at site where injected -stomach pain This list may not describe all possible side effects. Call your doctor for medical advice about side effects. You may report side effects to FDA at 1-800-FDA-1088. Where should I keep my medicine? This drug is given in a hospital or clinic and will not be stored at home. NOTE: This sheet is a summary. It may not cover all possible information. If you have questions about this medicine, talk to your doctor, pharmacist, or health   care provider.    2016, Elsevier/Gold Standard. (2015-02-25 11:03:55)  Denosumab injection What is this medicine? DENOSUMAB (den oh sue mab) slows bone breakdown. Prolia is used to treat osteoporosis in women after menopause  and in men. Xgeva is used to prevent bone fractures and other bone problems caused by cancer bone metastases. Xgeva is also used to treat giant cell tumor of the bone. This medicine may be used for other purposes; ask your health care provider or pharmacist if you have questions. What should I tell my health care provider before I take this medicine? They need to know if you have any of these conditions: -dental disease -eczema -infection or history of infections -kidney disease or on dialysis -low blood calcium or vitamin D -malabsorption syndrome -scheduled to have surgery or tooth extraction -taking medicine that contains denosumab -thyroid or parathyroid disease -an unusual reaction to denosumab, other medicines, foods, dyes, or preservatives -pregnant or trying to get pregnant -breast-feeding How should I use this medicine? This medicine is for injection under the skin. It is given by a health care professional in a hospital or clinic setting. If you are getting Prolia, a special MedGuide will be given to you by the pharmacist with each prescription and refill. Be sure to read this information carefully each time. For Prolia, talk to your pediatrician regarding the use of this medicine in children. Special care may be needed. For Xgeva, talk to your pediatrician regarding the use of this medicine in children. While this drug may be prescribed for children as young as 13 years for selected conditions, precautions do apply. Overdosage: If you think you have taken too much of this medicine contact a poison control center or emergency room at once. NOTE: This medicine is only for you. Do not share this medicine with others. What if I miss a dose? It is important not to miss your dose. Call your doctor or health care professional if you are unable to keep an appointment. What may interact with this medicine? Do not take this medicine with any of the following medications: -other medicines  containing denosumab This medicine may also interact with the following medications: -medicines that suppress the immune system -medicines that treat cancer -steroid medicines like prednisone or cortisone This list may not describe all possible interactions. Give your health care provider a list of all the medicines, herbs, non-prescription drugs, or dietary supplements you use. Also tell them if you smoke, drink alcohol, or use illegal drugs. Some items may interact with your medicine. What should I watch for while using this medicine? Visit your doctor or health care professional for regular checks on your progress. Your doctor or health care professional may order blood tests and other tests to see how you are doing. Call your doctor or health care professional if you get a cold or other infection while receiving this medicine. Do not treat yourself. This medicine may decrease your body's ability to fight infection. You should make sure you get enough calcium and vitamin D while you are taking this medicine, unless your doctor tells you not to. Discuss the foods you eat and the vitamins you take with your health care professional. See your dentist regularly. Brush and floss your teeth as directed. Before you have any dental work done, tell your dentist you are receiving this medicine. Do not become pregnant while taking this medicine or for 5 months after stopping it. Women should inform their doctor if they wish to become pregnant or think   they might be pregnant. There is a potential for serious side effects to an unborn child. Talk to your health care professional or pharmacist for more information. What side effects may I notice from receiving this medicine? Side effects that you should report to your doctor or health care professional as soon as possible: -allergic reactions like skin rash, itching or hives, swelling of the face, lips, or tongue -breathing problems -chest pain -fast,  irregular heartbeat -feeling faint or lightheaded, falls -fever, chills, or any other sign of infection -muscle spasms, tightening, or twitches -numbness or tingling -skin blisters or bumps, or is dry, peels, or red -slow healing or unexplained pain in the mouth or jaw -unusual bleeding or bruising Side effects that usually do not require medical attention (Report these to your doctor or health care professional if they continue or are bothersome.): -muscle pain -stomach upset, gas This list may not describe all possible side effects. Call your doctor for medical advice about side effects. You may report side effects to FDA at 1-800-FDA-1088. Where should I keep my medicine? This medicine is only given in a clinic, doctor's office, or other health care setting and will not be stored at home. NOTE: This sheet is a summary. It may not cover all possible information. If you have questions about this medicine, talk to your doctor, pharmacist, or health care provider.    2016, Elsevier/Gold Standard. (2012-01-28 12:37:47)   

## 2017-10-10 NOTE — Addendum Note (Signed)
Addended by: Dolores Lory on: 12/06/2017 01:09 PM   Modules accepted: Orders

## 2017-10-10 NOTE — Assessment & Plan Note (Signed)
She wants to get a portable oxygen concentrator and also get a new DME company.  We will requalify her today, titrate her oxygen needs.  We will order a portable concentrator for her.

## 2017-10-10 NOTE — Patient Instructions (Addendum)
We will temporarily stop Spiriva.  Start Trelegy 1 inhalation daily.  If you benefit from this change then let us know and we will continue this medication. Please start Mucinex 600 mg twice a day. Keep albuterol available to use 2 puffs for shortness of breath, wheezing, chest tightness. Your PET scan was reviewed.  I do not see any evidence for an obstructing lesion in your airways.  This is good news.  I do not think we need to do a bronchoscopy at this time. Walking O2 titration today to allow [place] to change DME companies, obtain a portable o2 concentrator Follow with Dr Lamonte Sakai in 3 months or sooner if you have any problems.

## 2017-10-11 ENCOUNTER — Telehealth: Payer: Self-pay | Admitting: Emergency Medicine

## 2017-10-11 ENCOUNTER — Other Ambulatory Visit: Payer: Self-pay | Admitting: Emergency Medicine

## 2017-10-11 DIAGNOSIS — J449 Chronic obstructive pulmonary disease, unspecified: Secondary | ICD-10-CM

## 2017-10-11 NOTE — Telephone Encounter (Signed)
Referral Notes  Number of Notes: 2  Type Date User Summary Attachment  General 10/11/2017 11:13 AM Ilona Sorrel - -  Note   Holding for signature.        Type Date User Summary Attachment  Provider Comments 10/11/2017 11:09 AM Dolores Lory, RN Provider Comments -  Note   Route (nasal cannula OR mask): nasal cannula  Liter Flow: 3-4L Frequency (continuous with stationary and portable oxygen unit needed OR only at night): continuous and portable  Was this based off an ONO?: no  DME: [facility]  Oxygen Conserving Device (Yes or No): yes Type of Oxygen (Gas or Liquid): Gas         Order placed as above on template.  Dolores Lory, RN Thu Oct 10, 2017 1:04 PM  SATURATION QUALIFICATIONS: (This note is used to comply with regulatory documentation for home oxygen)  Patient Saturations on Room Air at Rest = 92%  Patient Saturations on Room Air while Ambulating = 87% (just to stand)  Patient Saturations on 2 Liters of oxygen while Ambulating = 87%  Patient required 3-4 liters of oxygen while ambulating to sustain 91-92%  Please briefly explain why patient needs home oxygen: 03/05/202019 Norva Karvonen, RN    Docuuenation as above.  Bethanne Ginger has been made aware that we are waiting for RB to sign order placed today on correct template prior to sending them order. Will forward to RB and [facility] (closed) as reminder to sign order asap.

## 2017-10-11 NOTE — Progress Notes (Signed)
ab 

## 2017-10-13 DIAGNOSIS — J9611 Chronic respiratory failure with hypoxia: Secondary | ICD-10-CM | POA: Diagnosis not present

## 2017-10-14 ENCOUNTER — Telehealth: Payer: Self-pay | Admitting: Emergency Medicine

## 2017-10-14 DIAGNOSIS — J449 Chronic obstructive pulmonary disease, unspecified: Secondary | ICD-10-CM

## 2017-10-14 NOTE — Telephone Encounter (Signed)
Spoke with Bethanne Ginger at [facility], states that oxygen order needs to state "portable oxygen concentrator" instead of just "portable" as previously placed- this is to get pt a poc as pt is requesting.  New order placed.  Nothing further needed.

## 2017-10-14 NOTE — Telephone Encounter (Signed)
Gilda with [facility] returned call. CB [phone]

## 2017-10-14 NOTE — Telephone Encounter (Signed)
lmtcb x1 for Suzanne [name] with [facility].

## 2017-10-15 NOTE — Telephone Encounter (Signed)
Suzanne [name] has responded and states this order is still wrong please call her at [phone]

## 2017-10-15 NOTE — Addendum Note (Signed)
Addended by: Jannette Spanner on: 10/15/2017 10:45 AM   Modules accepted: Orders

## 2017-10-15 NOTE — Telephone Encounter (Signed)
Spoke with Gilda, we left out stationary on the order. Revised order. FYI PCC's

## 2017-10-16 MED FILL — TRELEGY ELLIPTA 100-62.5-25: 100-62.5-25 | 30 days supply | Qty: 60 | Fill #0

## 2017-10-17 DIAGNOSIS — J449 Chronic obstructive pulmonary disease, unspecified: Secondary | ICD-10-CM | POA: Diagnosis not present

## 2017-10-21 DIAGNOSIS — M47812 Spondylosis without myelopathy or radiculopathy, cervical region: Secondary | ICD-10-CM | POA: Diagnosis not present

## 2017-10-23 DIAGNOSIS — M47812 Spondylosis without myelopathy or radiculopathy, cervical region: Secondary | ICD-10-CM | POA: Diagnosis not present

## 2017-10-23 MED FILL — LORazepam 2 MG TABS: 2 | 30 days supply | Qty: 30 | Fill #1

## 2017-10-23 MED FILL — traMADol HCL 50 MG TABS: 50 | 5 days supply | Qty: 10 | Fill #0

## 2017-11-05 ENCOUNTER — Other Ambulatory Visit: Payer: Self-pay | Admitting: Hematology and Oncology

## 2017-11-06 ENCOUNTER — Ambulatory Visit: Payer: Self-pay

## 2017-11-06 MED ORDER — FULVESTRANT 250 MG/5ML IM SOLN
INTRAMUSCULAR | Status: AC
  Filled 2017-11-06: qty 10

## 2017-11-06 MED FILL — PROAIR HFA 90 MCG INHALER: 108 (90 BAS | 25 days supply | Qty: 9 | Fill #4

## 2017-11-06 MED FILL — IBRANCE 125 MG CAPSULE: 125 | 28 days supply | Qty: 21 | Fill #2

## 2017-11-08 ENCOUNTER — Other Ambulatory Visit: Payer: Self-pay | Admitting: Hematology and Oncology

## 2017-11-08 ENCOUNTER — Inpatient Hospital Stay: Payer: Medicare Other | Attending: Hematology and Oncology

## 2017-11-08 VITALS — BP 114/82 | HR 80 | Temp 97.5°F | Resp 20

## 2017-11-08 DIAGNOSIS — C787 Secondary malignant neoplasm of liver and intrahepatic bile duct: Secondary | ICD-10-CM | POA: Insufficient documentation

## 2017-11-08 DIAGNOSIS — Z79899 Other long term (current) drug therapy: Secondary | ICD-10-CM | POA: Diagnosis not present

## 2017-11-08 DIAGNOSIS — C50511 Malignant neoplasm of lower-outer quadrant of right female breast: Secondary | ICD-10-CM | POA: Diagnosis not present

## 2017-11-08 DIAGNOSIS — Z5111 Encounter for antineoplastic chemotherapy: Secondary | ICD-10-CM | POA: Diagnosis not present

## 2017-11-08 DIAGNOSIS — C7951 Secondary malignant neoplasm of bone: Secondary | ICD-10-CM | POA: Insufficient documentation

## 2017-11-08 MED ORDER — FULVESTRANT 250 MG/5ML IM SOLN
500.0000 mg | Freq: Once | INTRAMUSCULAR | Status: AC
  Administered 2017-11-08: 500 mg via INTRAMUSCULAR

## 2017-11-08 MED ORDER — FULVESTRANT 250 MG/5ML IM SOLN
INTRAMUSCULAR | Status: AC
  Filled 2017-11-08: qty 5

## 2017-11-08 MED ORDER — DENOSUMAB 120 MG/1.7ML ~~LOC~~ SOLN
SUBCUTANEOUS | Status: AC
  Filled 2017-11-08: qty 1.7

## 2017-11-13 DIAGNOSIS — J9611 Chronic respiratory failure with hypoxia: Secondary | ICD-10-CM | POA: Diagnosis not present

## 2017-11-17 DIAGNOSIS — J449 Chronic obstructive pulmonary disease, unspecified: Secondary | ICD-10-CM | POA: Diagnosis not present

## 2017-11-20 ENCOUNTER — Ambulatory Visit
Payer: Medicare Other | Attending: Student in an Organized Health Care Education/Training Program | Admitting: Student in an Organized Health Care Education/Training Program

## 2017-11-20 ENCOUNTER — Encounter: Payer: Self-pay | Admitting: Student in an Organized Health Care Education/Training Program

## 2017-11-20 ENCOUNTER — Other Ambulatory Visit: Payer: Self-pay

## 2017-11-20 VITALS — BP 97/66 | HR 78 | Temp 98.0°F | Resp 18 | Ht 65.0 in | Wt 178.0 lb

## 2017-11-20 DIAGNOSIS — M4722 Other spondylosis with radiculopathy, cervical region: Secondary | ICD-10-CM | POA: Diagnosis not present

## 2017-11-20 DIAGNOSIS — M25512 Pain in left shoulder: Secondary | ICD-10-CM | POA: Diagnosis not present

## 2017-11-20 DIAGNOSIS — Z923 Personal history of irradiation: Secondary | ICD-10-CM | POA: Diagnosis not present

## 2017-11-20 DIAGNOSIS — Z853 Personal history of malignant neoplasm of breast: Secondary | ICD-10-CM | POA: Diagnosis not present

## 2017-11-20 DIAGNOSIS — J9611 Chronic respiratory failure with hypoxia: Secondary | ICD-10-CM | POA: Diagnosis not present

## 2017-11-20 DIAGNOSIS — M5412 Radiculopathy, cervical region: Secondary | ICD-10-CM | POA: Diagnosis not present

## 2017-11-20 DIAGNOSIS — Z87442 Personal history of urinary calculi: Secondary | ICD-10-CM | POA: Insufficient documentation

## 2017-11-20 DIAGNOSIS — I10 Essential (primary) hypertension: Secondary | ICD-10-CM | POA: Diagnosis not present

## 2017-11-20 DIAGNOSIS — K219 Gastro-esophageal reflux disease without esophagitis: Secondary | ICD-10-CM | POA: Insufficient documentation

## 2017-11-20 DIAGNOSIS — J9811 Atelectasis: Secondary | ICD-10-CM | POA: Insufficient documentation

## 2017-11-20 DIAGNOSIS — R05 Cough: Secondary | ICD-10-CM | POA: Diagnosis not present

## 2017-11-20 DIAGNOSIS — C7800 Secondary malignant neoplasm of unspecified lung: Secondary | ICD-10-CM | POA: Diagnosis not present

## 2017-11-20 DIAGNOSIS — Z9221 Personal history of antineoplastic chemotherapy: Secondary | ICD-10-CM | POA: Diagnosis not present

## 2017-11-20 DIAGNOSIS — R200 Anesthesia of skin: Secondary | ICD-10-CM | POA: Diagnosis not present

## 2017-11-20 DIAGNOSIS — M79602 Pain in left arm: Secondary | ICD-10-CM | POA: Insufficient documentation

## 2017-11-20 DIAGNOSIS — C50511 Malignant neoplasm of lower-outer quadrant of right female breast: Secondary | ICD-10-CM | POA: Diagnosis not present

## 2017-11-20 DIAGNOSIS — Z170 Estrogen receptor positive status [ER+]: Secondary | ICD-10-CM | POA: Diagnosis not present

## 2017-11-20 DIAGNOSIS — M5030 Other cervical disc degeneration, unspecified cervical region: Secondary | ICD-10-CM

## 2017-11-20 DIAGNOSIS — G2581 Restless legs syndrome: Secondary | ICD-10-CM | POA: Insufficient documentation

## 2017-11-20 DIAGNOSIS — M542 Cervicalgia: Secondary | ICD-10-CM | POA: Insufficient documentation

## 2017-11-20 DIAGNOSIS — F1320 Sedative, hypnotic or anxiolytic dependence, uncomplicated: Secondary | ICD-10-CM | POA: Diagnosis not present

## 2017-11-20 DIAGNOSIS — N289 Disorder of kidney and ureter, unspecified: Secondary | ICD-10-CM | POA: Insufficient documentation

## 2017-11-20 DIAGNOSIS — C7951 Secondary malignant neoplasm of bone: Secondary | ICD-10-CM | POA: Diagnosis not present

## 2017-11-20 DIAGNOSIS — G894 Chronic pain syndrome: Secondary | ICD-10-CM | POA: Diagnosis not present

## 2017-11-20 DIAGNOSIS — I493 Ventricular premature depolarization: Secondary | ICD-10-CM | POA: Diagnosis not present

## 2017-11-20 DIAGNOSIS — J449 Chronic obstructive pulmonary disease, unspecified: Secondary | ICD-10-CM | POA: Insufficient documentation

## 2017-11-20 DIAGNOSIS — R7303 Prediabetes: Secondary | ICD-10-CM | POA: Insufficient documentation

## 2017-11-20 MED ORDER — CELECOXIB 200 MG PO CAPS: 200.0000 mg | ORAL_CAPSULE | Freq: Every day | ORAL | 0 refills | Status: DC

## 2017-11-20 MED ORDER — TIZANIDINE HCL 4 MG PO TABS: 4.0000 mg | ORAL_TABLET | Freq: Two times a day (BID) | ORAL | 1 refills | Status: DC | PRN

## 2017-11-20 MED FILL — tiZANidine HCL 4 MG TABS: 4 | 30 days supply | Qty: 60 | Fill #0

## 2017-11-20 MED FILL — CELECOXIB 200 MG CAPSULE: 200 | 30 days supply | Qty: 30 | Fill #0
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
## 2017-11-20 NOTE — Progress Notes (Signed)
Safety precautions to be maintained throughout the outpatient stay will include: orient to surroundings, keep bed in low position, maintain call bell within reach at all times, provide assistance with transfer out of bed and ambulation.  

## 2017-11-25 ENCOUNTER — Ambulatory Visit: Payer: Self-pay | Admitting: Student in an Organized Health Care Education/Training Program

## 2017-11-26 IMAGING — PT NM PET TUM IMG RESTAG (PS) SKULL BASE T - THIGH
9 series · 25 of 25 positions shown · non-contrast
Comparison: CT chest dated 04/23/2016.  PET-CT dated 07/29/2014.

CLINICAL DATA: Subsequent treatment strategy for right breast
cancer.

EXAM:
NUCLEAR MEDICINE PET SKULL BASE TO THIGH
TECHNIQUE: 11.1 mCi F-18 FDG was injected intravenously. Full-ring PET imaging
was performed from the skull base to thigh after the radiotracer. CT
data was obtained and used for attenuation correction and anatomic
localization.
FASTING BLOOD GLUCOSE:  Value: 125 mg/dl

[Series 3: pet sk_thigh ac · axial · 5.0mm · 4.07mm/px · z∈[-1010,-166]mm · 5 of 212 slices shown]
[im 1/212]
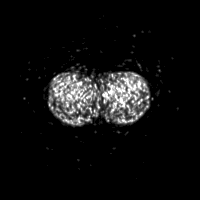
[im 53/212]
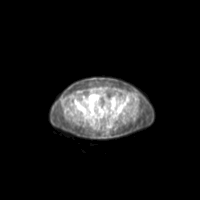
[im 106/212]
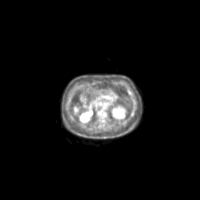
[im 159/212]
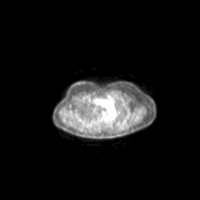
[im 212/212]
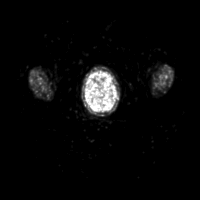

[Series 4: ct sk_thigh 5.0 b31f · axial · 5.0mm · 0.98mm/px · z∈[-1010,-166]mm · 5 of 212 slices shown]
[im 1/212]
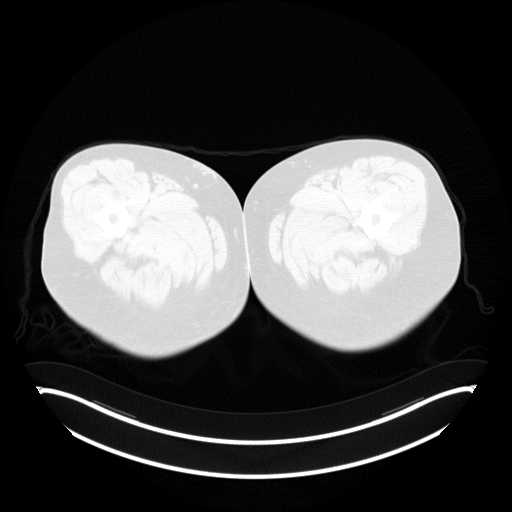
[im 53/212]
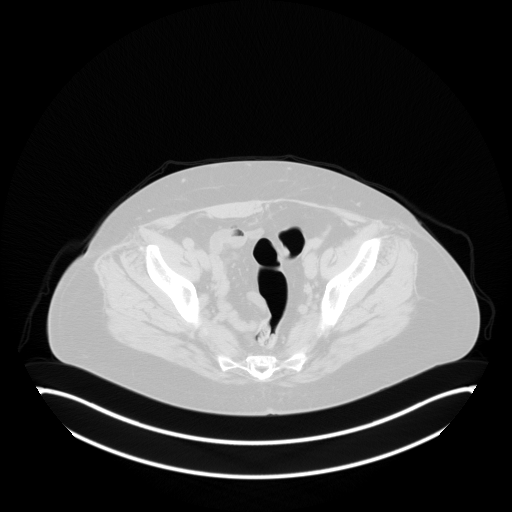
[im 106/212]
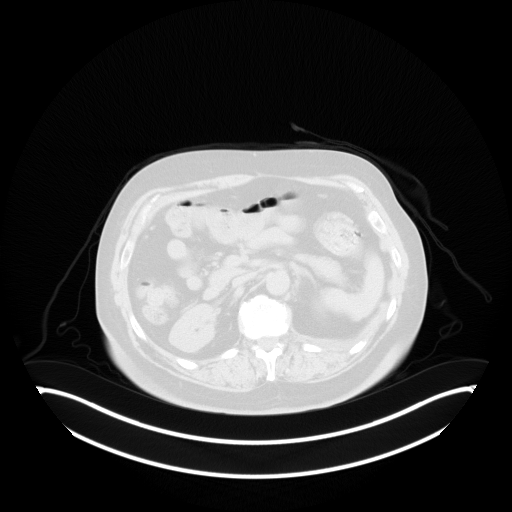
[im 159/212]
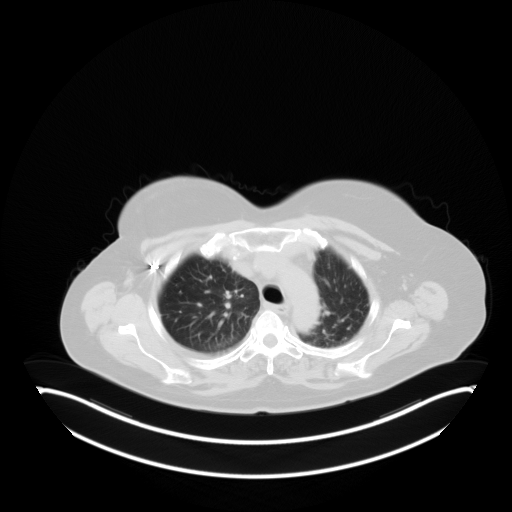
[im 212/212  brain]
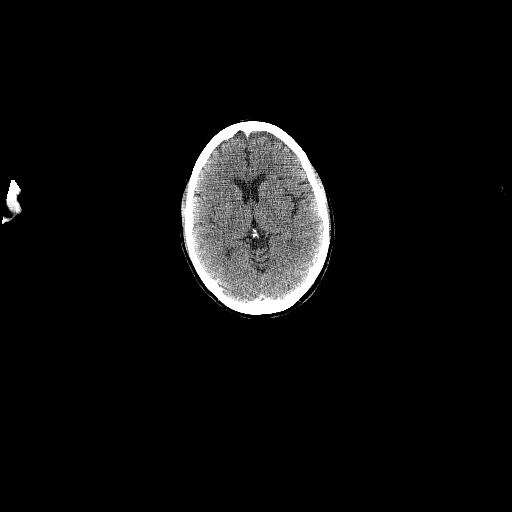

[Series 7: pet sk_thigh nac · axial · 5.0mm · 4.07mm/px · z∈[-1010,-166]mm · 5 of 212 slices shown]
[im 1/212]
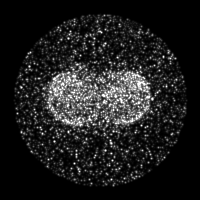
[im 53/212]
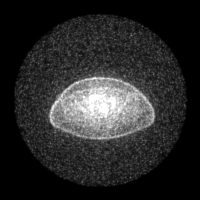
[im 106/212]
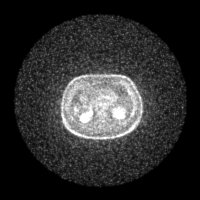
[im 159/212]
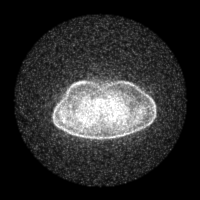
[im 212/212]
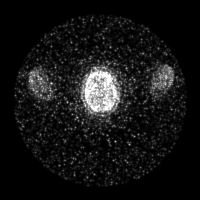

[Series 8: ct sk_thigh 5.0 b70f lung_bone · axial · 5.0mm · 0.61mm/px · 1 of 60 slices shown]
[im 1/60  bone]
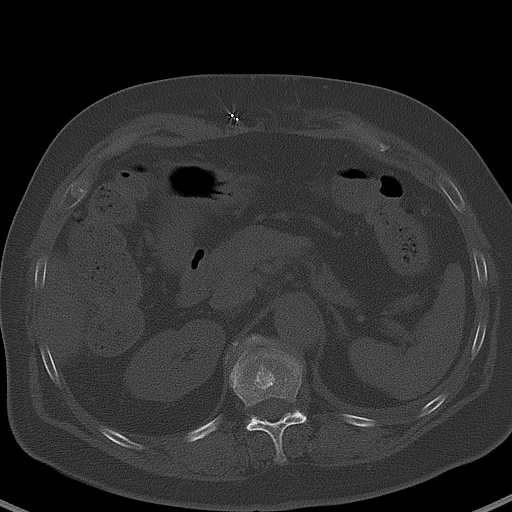

[Series 604: range-ct sk_thigh 5.0 (id)<alpha range> · 1 of 57 slices shown (1 of 2)]
[im 1/57]
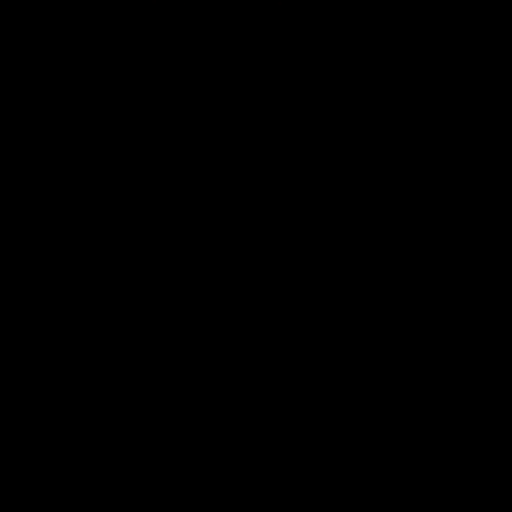

[Series 605: mip collection · coronal · 1.75mm/px · 1 of 32 slices shown]
[im 1/32]
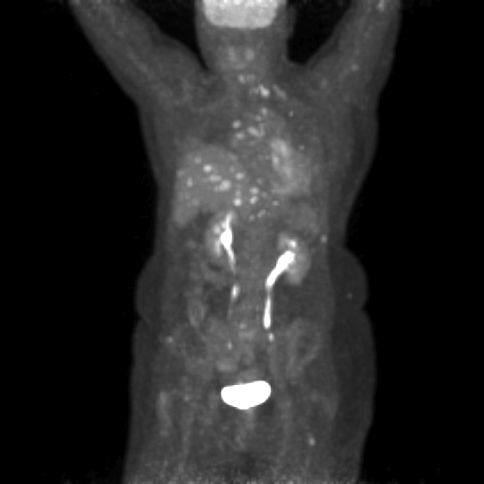

[Series 606: range-ct sk_thigh 5.0 (id)<alpha range> · 5 of 202 slices shown (2 of 2)]
[im 1/202]
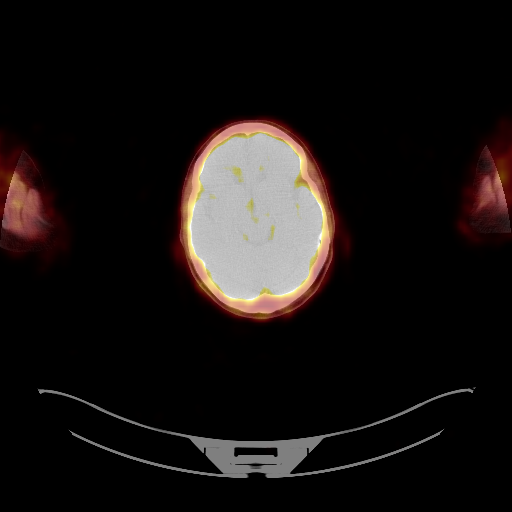
[im 51/202]
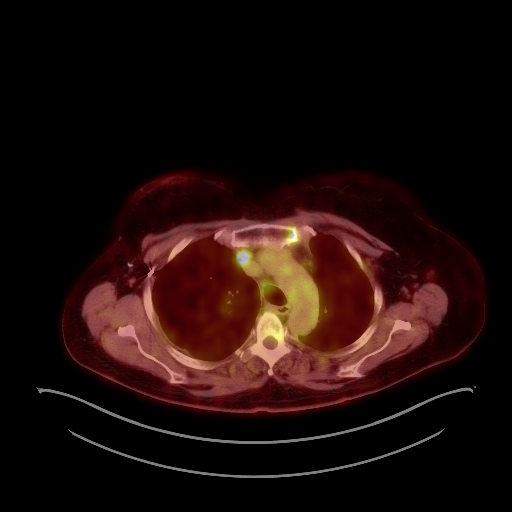
[im 101/202]
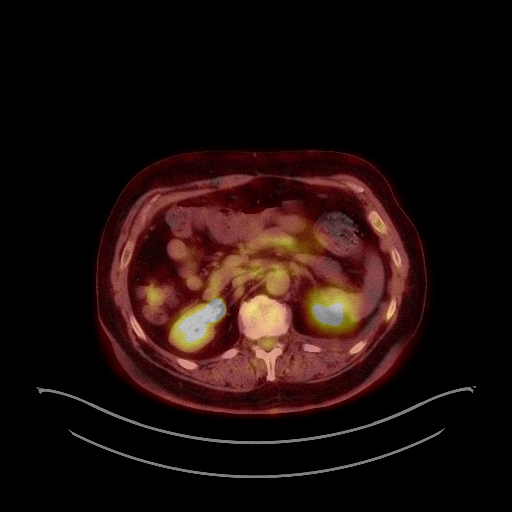
[im 151/202]
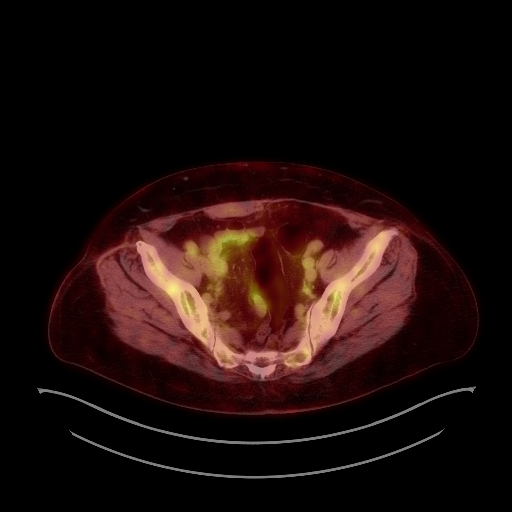
[im 202/202]
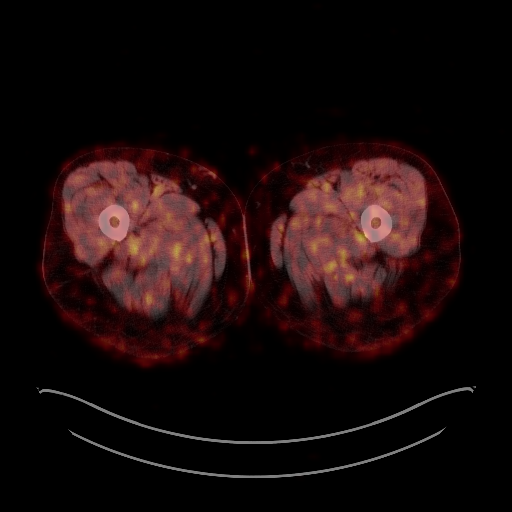

[Series 1280: results mm oncology reading · 0.98mm/px · 1 of 12 slices shown (1 of 2)]
[im 1/12]
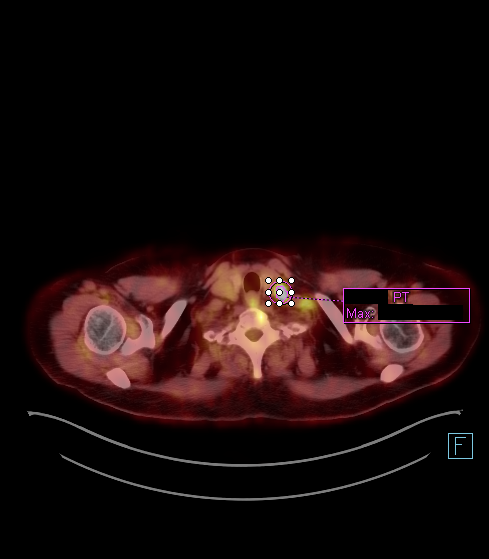

[Series 1423: results mm oncology reading · 1.32mm/px · 1 of 4 slices shown (2 of 2)]
[im 1/4]
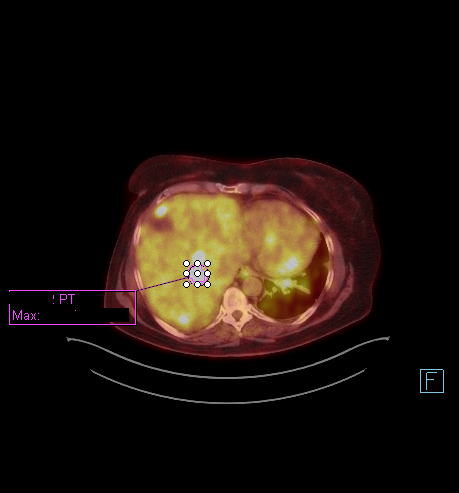

[25 of 25 positions shown; findings below may reference images not displayed]

FINDINGS: NECK

7 mm short axis left supraclavicular node, max SUV 12.1, suspicious
for nodal metastasis.

CHEST

Left infrahilar soft tissue (series 8/ image 31), max SUV 8.1,
worrisome for pulmonary metastasis. Primary bronchogenic neoplasm is
technically possible but considered unlikely.

Associated thoracic nodal metastases, including:

--14 mm short axis left hilar node (series 4/image 65), max SUV

--8 mm short axis low right paratracheal node (series 4/ image 60),
max SUV

--11 mm short axis right hilar node (series 4/image 60), max SUV

Additional bandlike areas of soft tissue/ fibrosis in the right lung
(series 8/ image 26), without appreciable hypermetabolism, favored
to reflect radiation changes.

Small right and trace left pleural effusions.  No pneumothorax.

Postoperative changes in the right breast/axilla without definite
hypermetabolism to suggest residual/recurrent tumor.

ABDOMEN/PELVIS

Multiple hypermetabolic metastases throughout the liver,
approximately 20 in number. Representative lesion the central left
liver demonstrates max SUV 15.8. Representative lesion in the
central right liver demonstrates max SUV 12.3.

No abnormal hypermetabolic activity within the pancreas, adrenal
glands, or spleen.

6 mm short axis perigastric node (series 4/ image 94), max SUV 6.0.

SKELETON

Multifocal osseous metastases, including:

--Left T1 vertebral body, max SUV

--Right T10 vertebral body, max SUV

--Right L5 vertebral body, max SUV

--Left proximal femoral shaft, max SUV
IMPRESSION: Postsurgical changes in the right breast/axilla.

Hypermetabolic soft tissue in the left infrahilar region, suspicious
for pulmonary metastasis. Primary bronchogenic neoplasm is
technically possible but considered less likely. Suspected radiation
changes in the right lung.

Left supraclavicular, mediastinal, bilateral hilar, and upper
abdominal nodal metastases.

Widespread hepatic metastases.

Multifocal osseous metastases, as above.

## 2017-11-27 LAB — COMPLIANCE DRUG ANALYSIS, UR

## 2017-11-28 DIAGNOSIS — M542 Cervicalgia: Secondary | ICD-10-CM | POA: Diagnosis not present

## 2017-11-28 MED FILL — LORazepam 2 MG TABS: 2 | 30 days supply | Qty: 30 | Fill #2

## 2017-12-03 ENCOUNTER — Ambulatory Visit: Payer: Self-pay | Admitting: Student in an Organized Health Care Education/Training Program

## 2017-12-04 DIAGNOSIS — M542 Cervicalgia: Secondary | ICD-10-CM | POA: Diagnosis not present

## 2017-12-06 ENCOUNTER — Other Ambulatory Visit: Payer: Self-pay

## 2017-12-06 DIAGNOSIS — M542 Cervicalgia: Secondary | ICD-10-CM | POA: Diagnosis not present

## 2017-12-06 DIAGNOSIS — C7800 Secondary malignant neoplasm of unspecified lung: Secondary | ICD-10-CM

## 2017-12-09 ENCOUNTER — Ambulatory Visit: Payer: Self-pay | Admitting: Hematology and Oncology

## 2017-12-09 ENCOUNTER — Other Ambulatory Visit: Payer: Self-pay

## 2017-12-09 ENCOUNTER — Ambulatory Visit: Payer: Self-pay

## 2017-12-09 MED ORDER — DENOSUMAB 120 MG/1.7ML ~~LOC~~ SOLN
SUBCUTANEOUS | Status: AC
  Filled 2017-12-09: qty 1.7

## 2017-12-09 MED ORDER — FULVESTRANT 250 MG/5ML IM SOLN
INTRAMUSCULAR | Status: AC
  Filled 2017-12-09: qty 10
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
## 2017-12-11 IMAGING — US US BIOPSY
1 series · 13 of 15 positions shown · non-contrast
Comparison: PET-CT - 04/26/2026

INDICATION: History of breast cancer, now with findings worrisome for extensive
hepatic metastatic disease. Please perform ultrasound-guided liver
lesion biopsy for tissue diagnostic purposes and for the acquisition
of ER/PR and Her 2 Forevar receptors.

EXAM:
ULTRASOUND GUIDED LIVER LESION BIOPSY

[Series 1: us biopsy · 0.15mm/px · 13 of 15 slices shown]
[im 1/15]
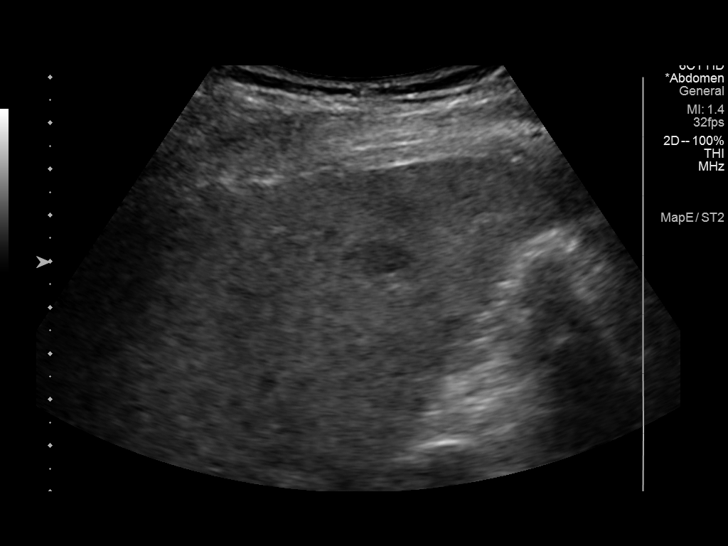
[im 2/15]
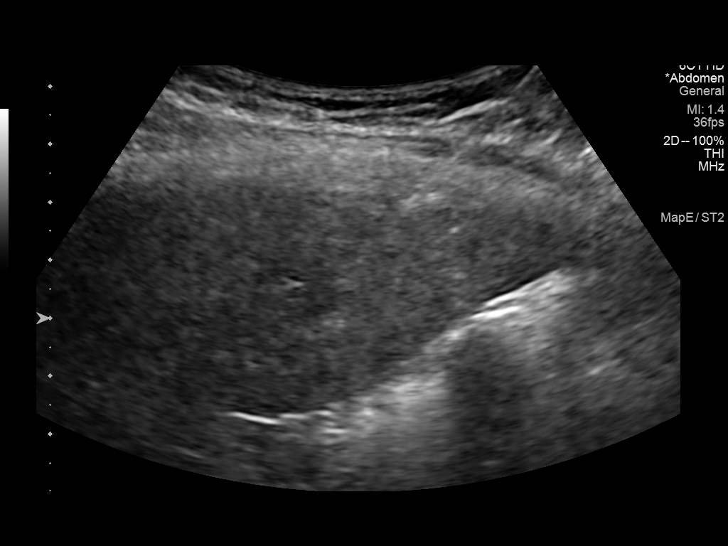
[im 3/15]
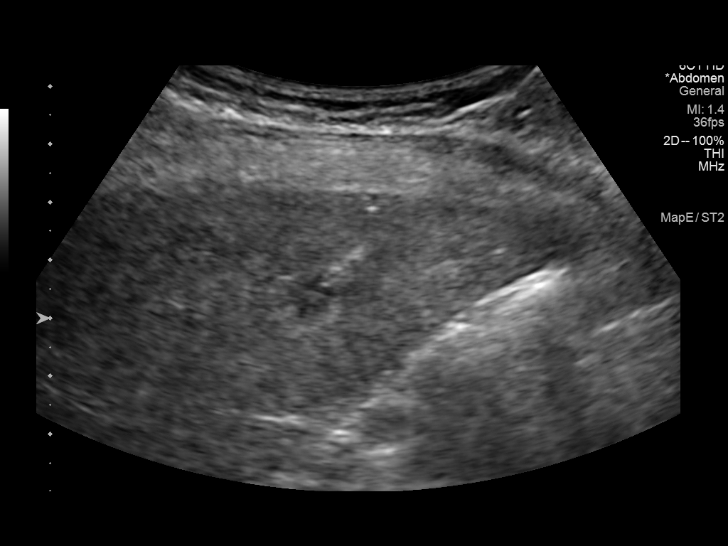
[im 5/15]
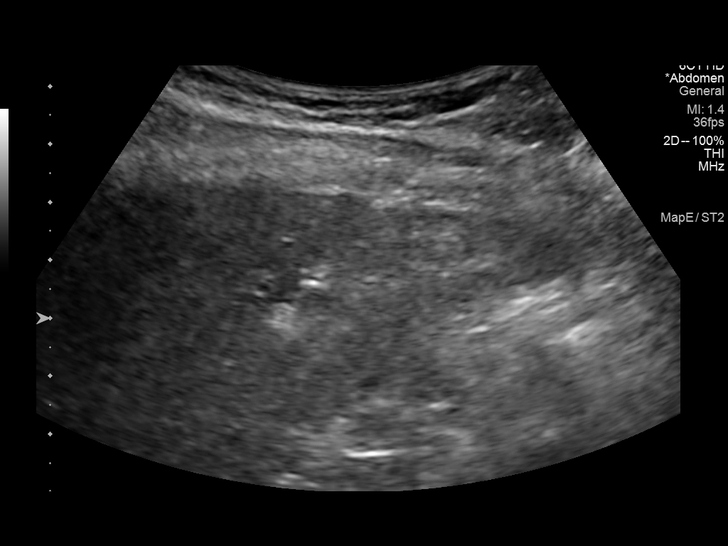
[im 6/15]
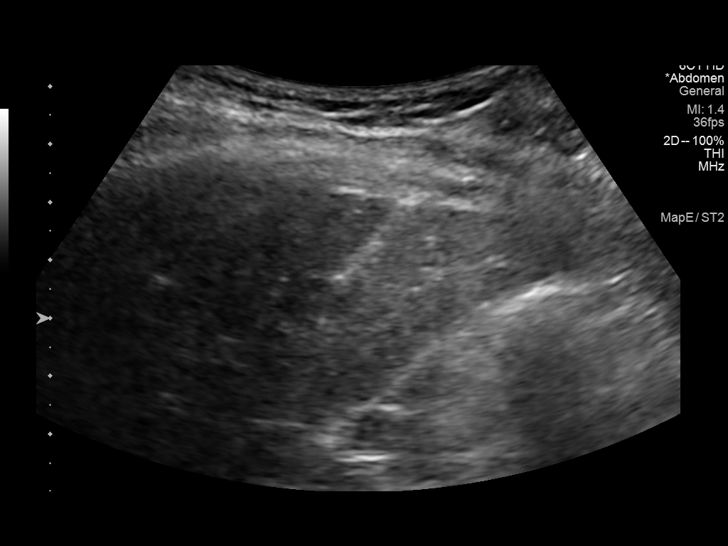
[im 7/15]
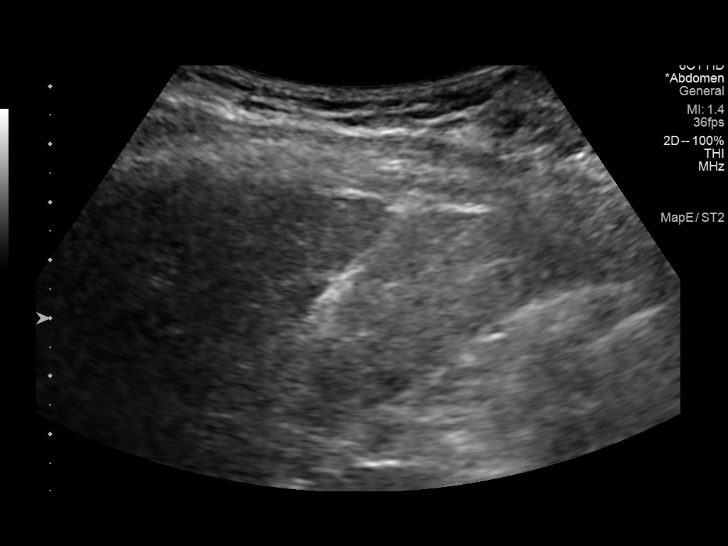
[im 8/15]
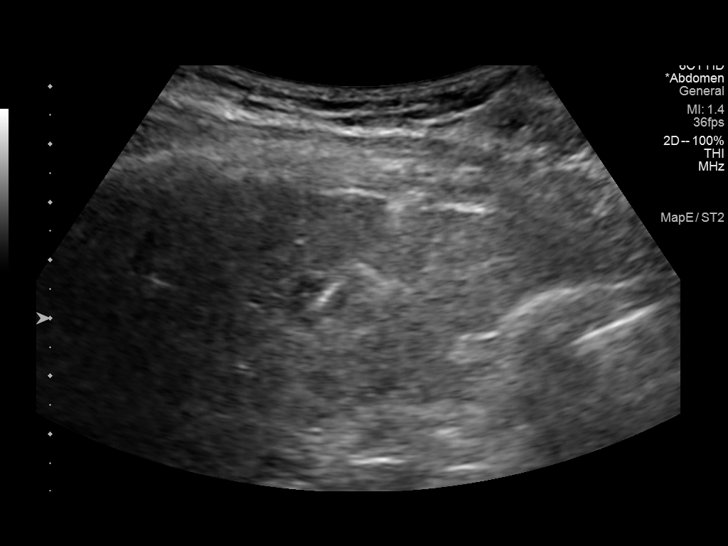
[im 9/15]
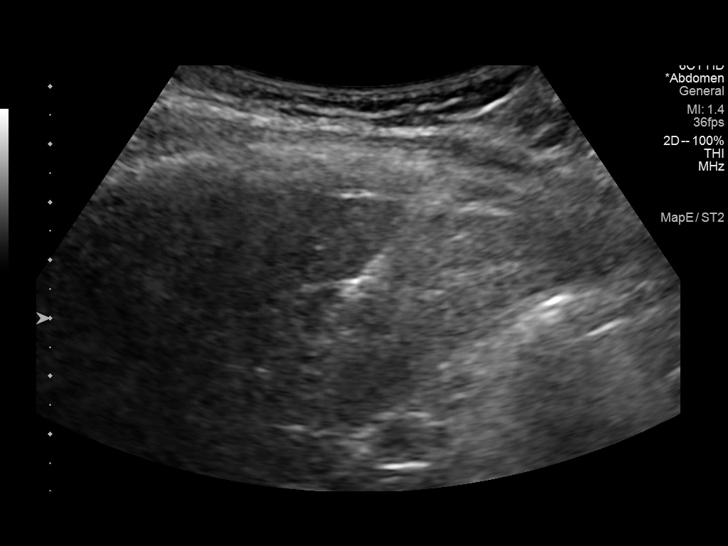
[im 10/15]
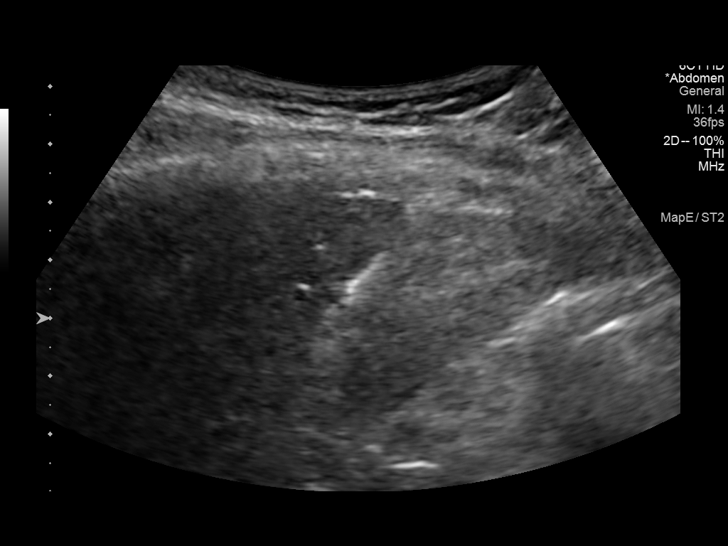
[im 11/15]
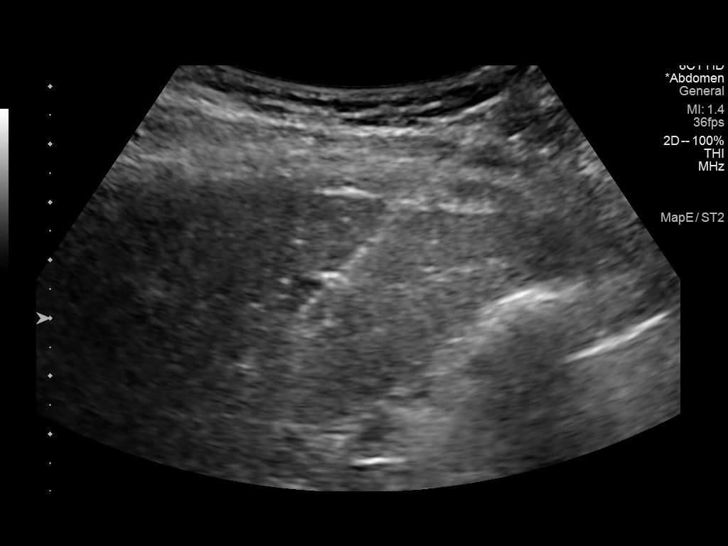
[im 13/15]
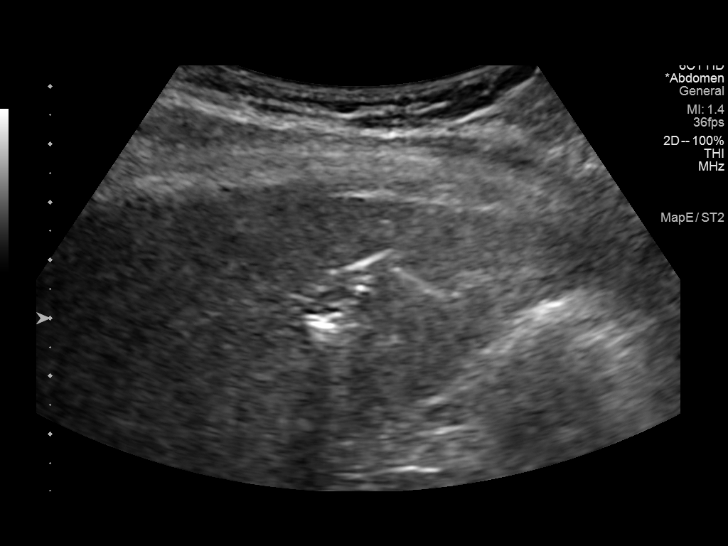
[im 14/15]
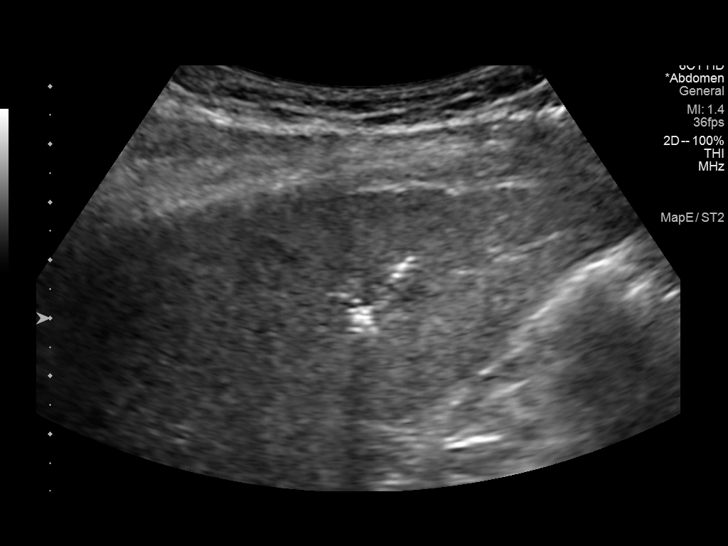
[im 15/15]
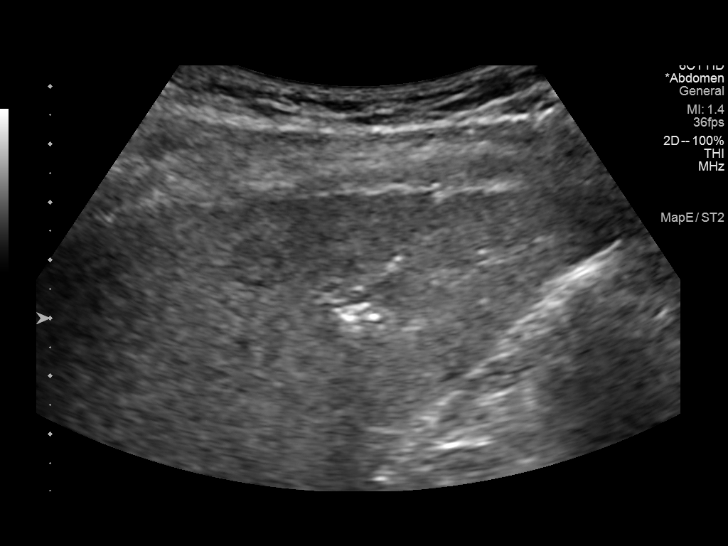

[13 of 15 positions shown; findings below may reference images not displayed]

MEDICATIONS:
None

ANESTHESIA/SEDATION:
Fentanyl 125 mcg IV

Total Moderate Sedation time: 20 minutes.

The patient's level of consciousness and vital signs were monitored
continuously by radiology nursing throughout the procedure under my
direct supervision.

COMPLICATIONS:
None immediate.

PROCEDURE:
Informed written consent was obtained from the patient after a
discussion of the risks, benefits and alternatives to treatment. The
patient understands and consents the procedure. A timeout was
performed prior to the initiation of the procedure.

Ultrasound scanning was performed of the right upper abdominal
quadrant demonstrates multiple ill-defined hypoechoic liver lesions
compatible the findings seen on preceding PET-CT. An approximately
1.2 x 0.9 cm lesion within the caudal aspect of the right lobe of
the liver was targeted for biopsy given lesion location and
sonographic window. The procedure was planned.

right upper abdominal quadrant was prepped and draped in the usual
sterile fashion. The overlying soft tissues were anesthetized with
1% lidocaine with epinephrine. A 17 gauge, 6.8 cm co-axial needle
was advanced into a peripheral aspect of the lesion. This was
followed by 5 core biopsies with an 18 gauge core device under
direct ultrasound guidance.

The coaxial needle tract was embolized with a small amount of
Gel-Foam slurry and superficial hemostasis was obtained with manual
compression. Post procedural scanning was negative for definitive
area of hemorrhage or additional complication. A dressing was
placed. The patient tolerated the procedure well without immediate
post procedural complication.
IMPRESSION: Technically successful ultrasound guided core needle biopsy of
indeterminate lesion within the caudal aspect the right lobe of the
liver.

## 2017-12-12 ENCOUNTER — Ambulatory Visit: Payer: Self-pay | Admitting: Student in an Organized Health Care Education/Training Program

## 2017-12-15 DIAGNOSIS — Z0001 Encounter for general adult medical examination with abnormal findings: Secondary | ICD-10-CM | POA: Insufficient documentation

## 2017-12-15 NOTE — Progress Notes (Signed)
MEDICARE ANNUAL WELLNESS VISIT AND FOLLOW UP  Assessment:   Diagnoses and all orders for this visit:  Encounter for Medicare annual wellness exam  Malignant neoplasm metastatic to lung, unspecified laterality ([place]) Followed by oncology/pulmonology  COPD with asthma ([place]) Continue inhalers Followed by pulmonology  Chronic hypoxemic respiratory failure ([place]) On O2 at home Followed by pulmonology - Dr. Lamonte Sakai - q3 months  Gastroesophageal reflux disease with esophagitis Well managed on current medications Discussed diet, avoiding triggers and other lifestyle changes  Bone metastases ([place]) Followed by oncology  Systolic dysfunction Continue medications, weigh daily, limit sodium intake Followed by cardiology  RLS (restless legs syndrome) Rare symptoms  Prediabetes Discussed disease and risks Discussed diet/exercise, weight management  A1C  Overweight (BMI 25.0-29.9) Long discussion about weight loss, diet, and exercise Recommended diet heavy in fruits and veggies and low in animal meats, cheeses, and dairy products, appropriate calorie intake Discussed appropriate weight for height  Follow up at next visit  Lung nodules Followed by [facility] pulmonology  Insomnia, unspecified type - good sleep hygiene discussed, increase day time activity, try melatonin or benadryl.   Chronic cough Followed by pulmonology  Malignant neoplasm of lower-outer quadrant of right breast of female, estrogen receptor positive ([place]) Managed by Dr. Lindi Adie  Benzodiazepine dependence [facility]) Monitor use closely; benzo is prescribed by Dr. Lindi Adie   Over 40 minutes of exam, counseling, chart review and critical decision making was performed Future Appointments  Date Time Provider [place]  12/17/2017  2:45 PM [facility] LAB 4 [facility] None  12/17/2017  3:15 PM Nicholas Lose, MD [facility] None  12/17/2017  3:30 PM [facility] FLUSH NURSE [facility] None  12/23/2017 12:00 PM  Gillis Santa, MD [facility] None  12/25/2017  1:30 PM Collene Gobble, MD [facility] None  06/30/2018  3:45 PM Unk Pinto, MD [facility] None  12/30/2018  3:45 PM Liane Comber, NP [facility] None     Plan:   During the course of the visit the patient was educated and counseled about appropriate screening and preventive services including:    Pneumococcal vaccine   Prevnar 13  Influenza vaccine  Td vaccine  Screening electrocardiogram  Bone densitometry screening  Colorectal cancer screening  Diabetes screening  Glaucoma screening  Nutrition counseling   Advanced directives: requested   Subjective:  Suzanne [name] is a 62 y.o. female who was lost to follow up presents for reestablishment and Medicare Annual Wellness Visit. She has breast cancer of right breast s/p mastectomy in 1994 with recurrence in 2007 with lumpectomy; she has been treated by chemotherapy and radiation but was found to have axillary, perihilar, pulmonary and liver nodules with bone mets; most recently treated by [facility] with Femara, managed by Dr. Nicholas Lose. Genetic testing was negative. She is followed by [facility] pulmonology for ?lung mets, COPD/asthma with chronic hypoxemic respiratory failure on  O2 2-4 L. She is 93% on 2L today; 91% off of O2 but quickly desats to 86% when she starts speaking. She is newly established with Dr. Holley Raring for management of chronic pain of neck and left shoulder with numbness, and is working with pain management- currently with celebrex and tizanidine as she is a poor surgical candidate.   She also has hx of alcohol abuse and benzodiazepine dependence; currently prescribed lorazepam 1-2 mg by Dr. Lindi Adie to be taken nightly for insomnia and doing well. She has also been prescribed trazodone to utilize but has not started this yet.    BMI is Body mass index is 29.79  kg/m., she has not been working on diet and exercise. Wt Readings from Last 3 Encounters:  12/16/17  179 lb (81.2 kg)  11/20/17 178 lb (80.7 kg)  10/10/17 186 lb (84.4 kg)    Her blood pressure has been controlled at home, today their BP is BP: 92/60 She does not workout. She denies chest pain, shortness of breath, dizziness.   She is not on cholesterol medication and denies myalgias. Her cholesterol is at goal. The cholesterol last visit was:   Lab Results  Component Value Date   CHOL 181 08/29/2015   HDL 58 08/29/2015   LDLCALC 96 08/29/2015   TRIG 133 08/29/2015   CHOLHDL 3.1 08/29/2015    She has not been working on diet and exercise for prediabetes, and denies foot ulcerations, increased appetite, nausea, paresthesia of the feet, polydipsia, polyuria, visual disturbances, vomiting and weight loss. Last A1C in the office was:  Lab Results  Component Value Date   HGBA1C 5.9 (H) 08/29/2015   Last GFR: Lab Results  Component Value Date   GFRNONAA >60 05/11/2016   Patient is not currently on Vitamin D supplement.   Lab Results  Component Value Date   VD25OH 28 (L) 08/29/2015      Medication Review: Current Outpatient Medications on File Prior to Visit  Medication Sig Dispense Refill  . albuterol (PROAIR HFA) 108 (90 Base) MCG/ACT inhaler INHALE 1 PUFF BY MOUTH EVERY 6 HOURS AS NEEDED FOR WHEEZING OR SHORTNESS OF BREATH 8.5 g 6  . albuterol (PROVENTIL) (2.5 MG/3ML) 0.083% nebulizer solution INHALE 1 VIAL VIA NEBULIZER EVERY 6 HOURS AS NEEDED FOR WHEEZING OR SHORTNESS OF BREATH 90 mL 9  . bisoprolol (ZEBETA) 5 MG tablet TAKE 1 TABLET (5 MG TOTAL) BY MOUTH DAILY. 30 tablet 2  . calcium carbonate (OS-CAL - DOSED IN MG OF ELEMENTAL CALCIUM) 1250 (500 Ca) MG tablet Take 1 tablet by mouth.    . cholecalciferol (VITAMIN D) 1000 units tablet Take 1,000 Units by mouth daily.    . fluticasone (FLONASE) 50 MCG/ACT nasal spray Place 2 sprays into both nostrils daily. 16 g 5  . Fluticasone-Umeclidin-Vilant (TRELEGY ELLIPTA) 100-62.5-25 MCG/INH AEPB Inhale 1 puff into the lungs daily. 1  each 3  . ibuprofen (ADVIL,MOTRIN) 200 MG tablet Take 200 mg by mouth every 6 (six) hours as needed.    Marland Kitchen letrozole (FEMARA) 2.5 MG tablet TAKE 1 TABLET BY MOUTH ONCE DAILY 90 tablet 1  . loratadine (CLARITIN) 10 MG tablet Take 1 tablet (10 mg total) by mouth daily. 30 tablet 5  . LORazepam (ATIVAN) 2 MG tablet TAKE 1/2 TO 1 TABLET BY MOUTH EACH NIGHT AT BEDTIME 30 tablet 5  . palbociclib (IBRANCE) 125 MG capsule Take 1 capsule (125 mg total) by mouth daily with breakfast. Take whole with food for 21 days on, 7 days off, repeat every 28 days. 21 capsule 6  . prochlorperazine (COMPAZINE) 10 MG tablet Take 1 tablet (10 mg total) by mouth every 6 (six) hours as needed for nausea or vomiting. 30 tablet 0  . promethazine (PHENERGAN) 25 MG tablet Take 1 tablet (25 mg total) by mouth every 6 (six) hours as needed for nausea. 30 tablet 3  . tiZANidine (ZANAFLEX) 4 MG tablet Take 1 tablet (4 mg total) by mouth 2 (two) times daily as needed for muscle spasms. 60 tablet 1  . gabapentin (NEURONTIN) 300 MG capsule Take 1 capsule (300 mg total) by mouth 3 (three) times daily. One twice daily (Patient not  taking: Reported on 12/16/2017) 90 capsule 3  . Tiotropium Bromide Monohydrate (SPIRIVA RESPIMAT) 2.5 MCG/ACT AERS Inhale 2 puffs into the lungs daily. (Patient not taking: Reported on 11/20/2017) 1 Inhaler 5  . traZODone (DESYREL) 50 MG tablet Take 1 tablet (50 mg total) by mouth at bedtime. (Patient not taking: Reported on 12/16/2017) 30 tablet 0   No current facility-administered medications on file prior to visit.     Allergies  Allergen Reactions  . Ace Inhibitors     cough    Current Problems (verified) Patient Active Problem List   Diagnosis Date Noted  . Encounter for Medicare annual wellness exam 12/15/2017  . Chronic hypoxemic respiratory failure ([place]) 05/29/2017  . Atelectasis of right lung 05/29/2017  . Genetic testing 05/31/2016  . Bone metastases ([place]) 05/07/2016  . Lung metastases ([place])  05/07/2016  . COPD with asthma ([place]) 02/10/2015  . Prediabetes 08/26/2014  . Systolic dysfunction 60/05/9322  . GERD (gastroesophageal reflux disease) 08/26/2014  . Overweight (BMI 25.0-29.9) 08/26/2014  . Lung nodules 08/26/2014  . Insomnia 08/26/2014  . RLS (restless legs syndrome) 08/26/2014  . Breast cancer of lower-outer quadrant of right female breast ([place]) 07/15/2012  . Essential hypertension   . Benzodiazepine dependence ([place]) 05/24/2012    Class: Chronic  . PVC (premature ventricular contraction) 11/29/2009  . Chronic cough 07/05/2008  . DYSPNEA 08/15/2007    Screening Tests Immunization History  Administered Date(s) Administered  . DTaP 05/24/2015  . Influenza Split 05/28/2013  . Influenza Whole 08/21/2007  . Influenza,inj,Quad PF,6+ Mos 05/13/2014, 05/13/2017  . Influenza-Unspecified 05/24/2015  . Pneumococcal Conjugate-13 08/23/2014  . Pneumococcal Polysaccharide-23 08/21/2007  . Td 05/14/2012  . Zoster 05/16/2011   Preventative care: Last colonoscopy: 2012 due 10 years Last mammogram: 03/2016 - she declines further, already on treatment, stage 4 Last pap smear/pelvic exam: remote, s/p hysterectomy, declines another   DEXA 12/2012 Declines PFT 06/2017 CT chest 10/2016 LL nodule  PET 1/201 negative- suggest continued CT  Echo 16/2018 55-60%  Prior vaccinations: TD or Tdap: 2013  Influenza: 2018  Pneumococcal: 2009 Prevnar13: 2016 Shingles/Zostavax: 2012  Names of Other Physician/Practitioners you currently use: 1. [facility] here for primary care 2. Dr. Gershon Crane, eye doctor, last visit 2018, sees annually 3. Dr. Carman Ching, dentist, last visit 2018, q6 months  Patient Care Team: Unk Pinto, MD as PCP - General (Internal Medicine) Ronald Lobo, MD as Consulting Physician (Gastroenterology) Tanda Rockers, MD as Consulting Physician (Pulmonary Disease) Nicholas Lose, MD as Consulting Physician (Hematology  and Oncology) Bensimhon, Shaune Pascal, MD as Consulting Physician (Cardiology)  SURGICAL HISTORY She  has a past surgical history that includes R transflap ('94); R masectomy ('94 ); Mastectomy; ORIF distal radius fracture; Cardiac catheterization; and Port-a-cath removal (07/29/2012). FAMILY HISTORY Her family history includes Alcohol abuse in her brother, father, paternal grandfather, and paternal uncle; Cancer in her paternal grandmother; Coronary artery disease in her unknown relative; Heart attack in her father; Hyperlipidemia in her unknown relative; Hypertension in her mother; Thyroid disease in her unknown relative. SOCIAL HISTORY She  reports that she has never smoked. She has never used smokeless tobacco. She reports that she drinks alcohol. She reports that she does not use drugs.   MEDICARE WELLNESS OBJECTIVES: Physical activity: Current Exercise Habits: The patient does not participate in regular exercise at present, Exercise limited by: respiratory conditions(s) Cardiac risk factors: Cardiac Risk Factors include: hypertension;sedentary lifestyle Depression/mood screen:   Depression screen [facility] 2/9 12/16/2017  Decreased Interest 0  Down, Depressed, Hopeless 0  PHQ - 2 Score 0  Altered sleeping -  Tired, decreased energy -  Change in appetite -  Feeling bad or failure about yourself  -  Trouble concentrating -  Moving slowly or fidgety/restless -  Suicidal thoughts -  PHQ-9 Score -  Some recent data might be hidden    ADLs:  In your present state of health, do you have any difficulty performing the following activities: 12/16/2017  Hearing? N  Vision? N  Comment Bilateral cataracts, can still read and drive  Difficulty concentrating or making decisions? N  Walking or climbing stairs? N  Dressing or bathing? N  Doing errands, shopping? Y  Comment Neck pain, can be drive by daughter or husband  Some recent data might be hidden     Cognitive Testing  Alert? Yes  Normal  Appearance?Yes  Oriented to person? Yes  Place? Yes   Time? Yes  Recall of three objects?  Yes  Can perform simple calculations? Yes  Displays appropriate judgment?Yes  Can read the correct time from a watch face?Yes  EOL planning: Does Patient Have a Medical Advance Directive?: No Would patient like information on creating a medical advance directive?: Yes (MAU/Ambulatory/Procedural Areas - Information given)  Review of Systems  Constitutional: Negative for malaise/fatigue and weight loss.  HENT: Negative for hearing loss and tinnitus.   Eyes: Negative for blurred vision and double vision.  Respiratory: Positive for shortness of breath. Negative for cough, sputum production and wheezing.   Cardiovascular: Negative for chest pain, palpitations, orthopnea, claudication, leg swelling and PND.  Gastrointestinal: Negative for abdominal pain, blood in stool, constipation, diarrhea, heartburn, melena, nausea and vomiting.  Genitourinary: Negative.   Musculoskeletal: Positive for neck pain. Negative for falls, joint pain and myalgias.  Skin: Negative for rash.  Neurological: Positive for tingling and sensory change. Negative for dizziness, weakness and headaches.  Endo/Heme/Allergies: Negative for polydipsia.  Psychiatric/Behavioral: Negative.  Negative for depression, memory loss, substance abuse and suicidal ideas. The patient is not nervous/anxious and does not have insomnia.   All other systems reviewed and are negative.    Objective:     Today's Vitals   12/16/17 1411  BP: 92/60  Pulse: 74  Temp: (!) 97.3 F (36.3 C)  SpO2: 93%  Weight: 179 lb (81.2 kg)  Height: 5\' 5"  (1.651 m)   Body mass index is 29.79 kg/m.  General appearance: alert, no distress, WD/WN, female HEENT: normocephalic, sclerae anicteric, TMs pearly, nares patent, no discharge or erythema, pharynx normal Oral cavity: MMM, no lesions Neck: supple, no lymphadenopathy, no thyromegaly, no masses Heart: RRR,  normal S1, S2, no murmurs Lungs: CTA bilaterally, faint wheezes in RUL, clear after coughing, no rhonchi, or rales Abdomen: +bs, soft, non tender, non distended, no masses, no hepatomegaly, no splenomegaly Musculoskeletal: nontender, no swelling, no obvious deformity Extremities: no edema, no cyanosis, no clubbing Pulses: 2+ symmetric, upper and lower extremities, normal cap refill Neurological: alert, oriented x 3, CN2-12 intact, strength normal upper extremities and lower extremities, sensation normal throughout, no cerebellar signs, gait slow Psychiatric: normal affect, behavior normal, pleasant   Medicare Attestation I have personally reviewed: The patient's medical and social history Their use of alcohol, tobacco or illicit drugs Their current medications and supplements The patient's functional ability including ADLs,fall risks, home safety risks, cognitive, and hearing and visual impairment Diet and physical activities Evidence for depression or mood disorders  The patient's weight, height, BMI, and visual acuity have been recorded in  the chart.  I have made referrals, counseling, and provided education to the patient based on review of the above and I have provided the patient with a written personalized care plan for preventive services.     Izora Ribas, NP   12/16/2017

## 2017-12-16 ENCOUNTER — Encounter: Payer: Self-pay | Admitting: Adult Health

## 2017-12-16 ENCOUNTER — Ambulatory Visit: Payer: Medicare Other | Admitting: Adult Health

## 2017-12-16 VITALS — BP 92/60 | HR 74 | Temp 97.3°F | Ht 65.0 in | Wt 179.0 lb

## 2017-12-16 DIAGNOSIS — J9611 Chronic respiratory failure with hypoxia: Secondary | ICD-10-CM

## 2017-12-16 DIAGNOSIS — R918 Other nonspecific abnormal finding of lung field: Secondary | ICD-10-CM | POA: Diagnosis not present

## 2017-12-16 DIAGNOSIS — R7303 Prediabetes: Secondary | ICD-10-CM | POA: Diagnosis not present

## 2017-12-16 DIAGNOSIS — E663 Overweight: Secondary | ICD-10-CM

## 2017-12-16 DIAGNOSIS — F1320 Sedative, hypnotic or anxiolytic dependence, uncomplicated: Secondary | ICD-10-CM

## 2017-12-16 DIAGNOSIS — R05 Cough: Secondary | ICD-10-CM | POA: Diagnosis not present

## 2017-12-16 DIAGNOSIS — C50511 Malignant neoplasm of lower-outer quadrant of right female breast: Secondary | ICD-10-CM | POA: Diagnosis not present

## 2017-12-16 DIAGNOSIS — G4700 Insomnia, unspecified: Secondary | ICD-10-CM | POA: Diagnosis not present

## 2017-12-16 DIAGNOSIS — K2100 Gastro-esophageal reflux disease with esophagitis, without bleeding: Secondary | ICD-10-CM

## 2017-12-16 DIAGNOSIS — E785 Hyperlipidemia, unspecified: Secondary | ICD-10-CM | POA: Diagnosis not present

## 2017-12-16 DIAGNOSIS — J449 Chronic obstructive pulmonary disease, unspecified: Secondary | ICD-10-CM | POA: Diagnosis not present

## 2017-12-16 DIAGNOSIS — I519 Heart disease, unspecified: Secondary | ICD-10-CM

## 2017-12-16 DIAGNOSIS — C7951 Secondary malignant neoplasm of bone: Secondary | ICD-10-CM | POA: Diagnosis not present

## 2017-12-16 DIAGNOSIS — Z79899 Other long term (current) drug therapy: Secondary | ICD-10-CM | POA: Diagnosis not present

## 2017-12-16 DIAGNOSIS — Z0001 Encounter for general adult medical examination with abnormal findings: Secondary | ICD-10-CM | POA: Diagnosis not present

## 2017-12-16 DIAGNOSIS — G2581 Restless legs syndrome: Secondary | ICD-10-CM

## 2017-12-16 DIAGNOSIS — R053 Chronic cough: Secondary | ICD-10-CM

## 2017-12-16 DIAGNOSIS — R6889 Other general symptoms and signs: Secondary | ICD-10-CM | POA: Diagnosis not present

## 2017-12-16 DIAGNOSIS — E559 Vitamin D deficiency, unspecified: Secondary | ICD-10-CM | POA: Diagnosis not present

## 2017-12-16 DIAGNOSIS — Z170 Estrogen receptor positive status [ER+]: Secondary | ICD-10-CM

## 2017-12-16 DIAGNOSIS — C7800 Secondary malignant neoplasm of unspecified lung: Secondary | ICD-10-CM

## 2017-12-16 DIAGNOSIS — Z0000 Encounter for general adult medical examination without abnormal findings: Secondary | ICD-10-CM

## 2017-12-16 DIAGNOSIS — J4489 Other specified chronic obstructive pulmonary disease: Secondary | ICD-10-CM

## 2017-12-16 MED ORDER — CELECOXIB 200 MG PO CAPS: 200.0000 mg | ORAL_CAPSULE | Freq: Two times a day (BID) | ORAL | 0 refills | Status: DC | PRN

## 2017-12-16 NOTE — Patient Instructions (Signed)
Aim for 7+ servings of fruits and vegetables daily  80+ fluid ounces of water or unsweet tea for healthy kidneys  Limit alcohol intake  Limit animal fats in diet for cholesterol and heart health - choose grass fed whenever available  Aim for low stress - take time to unwind and care for your mental health  Aim for 150 min of moderate intensity exercise weekly for heart health, and weights twice weekly for bone health  Aim for 7-9 hours of sleep daily  

## 2017-12-17 ENCOUNTER — Inpatient Hospital Stay: Payer: Medicare Other

## 2017-12-17 ENCOUNTER — Inpatient Hospital Stay: Payer: Medicare Other | Attending: Hematology and Oncology

## 2017-12-17 ENCOUNTER — Telehealth: Payer: Self-pay | Admitting: Hematology and Oncology

## 2017-12-17 ENCOUNTER — Inpatient Hospital Stay (HOSPITAL_BASED_OUTPATIENT_CLINIC_OR_DEPARTMENT_OTHER): Payer: Medicare Other | Admitting: Hematology and Oncology

## 2017-12-17 DIAGNOSIS — Z79811 Long term (current) use of aromatase inhibitors: Secondary | ICD-10-CM | POA: Diagnosis not present

## 2017-12-17 DIAGNOSIS — T451X5A Adverse effect of antineoplastic and immunosuppressive drugs, initial encounter: Secondary | ICD-10-CM | POA: Insufficient documentation

## 2017-12-17 DIAGNOSIS — Z923 Personal history of irradiation: Secondary | ICD-10-CM | POA: Diagnosis not present

## 2017-12-17 DIAGNOSIS — D709 Neutropenia, unspecified: Secondary | ICD-10-CM | POA: Insufficient documentation

## 2017-12-17 DIAGNOSIS — C50511 Malignant neoplasm of lower-outer quadrant of right female breast: Secondary | ICD-10-CM

## 2017-12-17 DIAGNOSIS — R531 Weakness: Secondary | ICD-10-CM | POA: Diagnosis not present

## 2017-12-17 DIAGNOSIS — T451X5S Adverse effect of antineoplastic and immunosuppressive drugs, sequela: Secondary | ICD-10-CM | POA: Diagnosis not present

## 2017-12-17 DIAGNOSIS — R0602 Shortness of breath: Secondary | ICD-10-CM | POA: Diagnosis not present

## 2017-12-17 DIAGNOSIS — J449 Chronic obstructive pulmonary disease, unspecified: Secondary | ICD-10-CM | POA: Diagnosis not present

## 2017-12-17 DIAGNOSIS — D6959 Other secondary thrombocytopenia: Secondary | ICD-10-CM | POA: Diagnosis not present

## 2017-12-17 DIAGNOSIS — R7989 Other specified abnormal findings of blood chemistry: Secondary | ICD-10-CM | POA: Diagnosis not present

## 2017-12-17 DIAGNOSIS — C7800 Secondary malignant neoplasm of unspecified lung: Secondary | ICD-10-CM

## 2017-12-17 DIAGNOSIS — Z170 Estrogen receptor positive status [ER+]: Secondary | ICD-10-CM | POA: Insufficient documentation

## 2017-12-17 LAB — CMP (CANCER CENTER ONLY)
ALK PHOS: 93 U/L (ref 40–150)
ALT: 35 U/L (ref 0–55)
AST: 83 U/L — AB (ref 5–34)
Albumin: 3 g/dL — ABNORMAL LOW (ref 3.5–5.0)
Anion gap: 4 (ref 3–11)
BILIRUBIN TOTAL: 0.8 mg/dL (ref 0.2–1.2)
BUN: 10 mg/dL (ref 7–26)
CALCIUM: 9 mg/dL (ref 8.4–10.4)
CO2: 31 mmol/L — ABNORMAL HIGH (ref 22–29)
CREATININE: 0.82 mg/dL (ref 0.60–1.10)
Chloride: 101 mmol/L (ref 98–109)
GFR, Est AFR Am: >60 mL/min (ref >60)
Glucose, Bld: 144 mg/dL — ABNORMAL HIGH (ref 70–140)
POTASSIUM: 3.5 mmol/L (ref 3.5–5.1)
Sodium: 136 mmol/L (ref 136–145)
TOTAL PROTEIN: 6.6 g/dL (ref 6.4–8.3)

## 2017-12-17 LAB — CBC WITH DIFFERENTIAL (CANCER CENTER ONLY)
BASOS ABS: 0.1 10*3/uL (ref 0.0–0.1)
Basophils Relative: 2 %
Eosinophils Absolute: 0 10*3/uL (ref 0.0–0.5)
Eosinophils Relative: 1 %
HEMATOCRIT: 31.6 % — AB (ref 34.8–46.6)
Hemoglobin: 11.2 g/dL — ABNORMAL LOW (ref 11.6–15.9)
LYMPHS PCT: 35 %
Lymphs Abs: 1 10*3/uL (ref 0.9–3.3)
MCH: 45.3 pg — ABNORMAL HIGH (ref 25.1–34.0)
MCHC: 35.4 g/dL (ref 31.5–36.0)
MCV: 127.9 fL — AB (ref 79.5–101.0)
MONO ABS: 0.5 10*3/uL (ref 0.1–0.9)
MONOS PCT: 18 %
NEUTROS ABS: 1.2 10*3/uL — AB (ref 1.5–6.5)
Neutrophils Relative %: 44 %
Platelet Count: 61 10*3/uL — ABNORMAL LOW (ref 145–400)
RBC: 2.47 MIL/uL — ABNORMAL LOW (ref 3.70–5.45)
RDW: 13.1 % (ref 11.2–14.5)
WBC Count: 2.7 10*3/uL — ABNORMAL LOW (ref 3.9–10.3)

## 2017-12-17 LAB — VITAMIN D 25 HYDROXY (VIT D DEFICIENCY, FRACTURES): Vit D, 25-Hydroxy: 35 ng/mL (ref 30–100)

## 2017-12-17 LAB — LIPID PANEL
CHOLESTEROL: 141 mg/dL (ref <200)
HDL: 43 mg/dL — AB (ref >50)
LDL Cholesterol (Calc): 82 mg/dL (calc)
NON-HDL CHOLESTEROL (CALC): 98 mg/dL (ref <130)
Total CHOL/HDL Ratio: 3.3 (calc) (ref <5.0)
Triglycerides: 82 mg/dL (ref <150)

## 2017-12-17 LAB — MAGNESIUM: Magnesium: 1.7 mg/dL (ref 1.5–2.5)

## 2017-12-17 LAB — TSH: TSH: 4.47 mIU/L (ref 0.40–4.50)

## 2017-12-17 LAB — HEMOGLOBIN A1C
HEMOGLOBIN A1C: 5.2 %{Hb} (ref <5.7)
MEAN PLASMA GLUCOSE: 103 (calc)
eAG (mmol/L): 5.7 (calc)

## 2017-12-17 MED ORDER — FULVESTRANT 250 MG/5ML IM SOLN
INTRAMUSCULAR | Status: AC
  Filled 2017-12-17: qty 5

## 2017-12-17 MED ORDER — HYDROMORPHONE HCL 2 MG PO TABS: 1.0000 mg | ORAL_TABLET | ORAL | 0 refills | Status: DC | PRN

## 2017-12-17 MED ORDER — FULVESTRANT 250 MG/5ML IM SOLN
INTRAMUSCULAR | Status: AC
Start: 2017-12-17 — End: ?
  Filled 2017-12-17: qty 5

## 2017-12-17 MED ORDER — DENOSUMAB 120 MG/1.7ML ~~LOC~~ SOLN
SUBCUTANEOUS | Status: AC
  Filled 2017-12-17: qty 1.7

## 2017-12-17 MED ORDER — DENOSUMAB 120 MG/1.7ML ~~LOC~~ SOLN
120.0000 mg | Freq: Once | SUBCUTANEOUS | Status: AC
  Administered 2017-12-17: 120 mg via SUBCUTANEOUS

## 2017-12-17 MED ORDER — FULVESTRANT 250 MG/5ML IM SOLN
500.0000 mg | Freq: Once | INTRAMUSCULAR | Status: AC
  Administered 2017-12-17: 500 mg via INTRAMUSCULAR

## 2017-12-17 MED FILL — PROAIR HFA 90 MCG INHALER: 108 (90 BAS | 25 days supply | Qty: 9 | Fill #5

## 2017-12-17 MED FILL — TRELEGY ELLIPTA 100-62.5-25: 100-62.5-25 | 30 days supply | Qty: 60 | Fill #1

## 2017-12-17 MED FILL — IBRANCE 125 MG CAPSULE: 125 | 28 days supply | Qty: 21 | Fill #3

## 2017-12-17 MED FILL — HYDROmorphone HCL 2 MG TABS: 2 | 15 days supply | Qty: 30 | Fill #0

## 2017-12-17 MED FILL — LETROZOLE 2.5 MG TABLET: 2.5 | 90 days supply | Qty: 90 | Fill #1

## 2017-12-17 NOTE — Patient Instructions (Addendum)
Denosumab injection What is this medicine? DENOSUMAB (den oh sue mab) slows bone breakdown. Prolia is used to treat osteoporosis in women after menopause and in men. Delton See is used to treat a high calcium level due to cancer and to prevent bone fractures and other bone problems caused by multiple myeloma or cancer bone metastases. Delton See is also used to treat giant cell tumor of the bone. This medicine may be used for other purposes; ask your health care provider or pharmacist if you have questions. COMMON BRAND NAME(S): Prolia, XGEVA What should I tell my health care provider before I take this medicine? They need to know if you have any of these conditions: -dental disease -having surgery or tooth extraction -infection -kidney disease -low levels of calcium or Vitamin D in the blood -malnutrition -on hemodialysis -skin conditions or sensitivity -thyroid or parathyroid disease -an unusual reaction to denosumab, other medicines, foods, dyes, or preservatives -pregnant or trying to get pregnant -breast-feeding How should I use this medicine? This medicine is for injection under the skin. It is given by a health care professional in a hospital or clinic setting. If you are getting Prolia, a special MedGuide will be given to you by the pharmacist with each prescription and refill. Be sure to read this information carefully each time. For Prolia, talk to your pediatrician regarding the use of this medicine in children. Special care may be needed. For Delton See, talk to your pediatrician regarding the use of this medicine in children. While this drug may be prescribed for children as young as 13 years for selected conditions, precautions do apply. Overdosage: If you think you have taken too much of this medicine contact a poison control center or emergency room at once. NOTE: This medicine is only for you. Do not share this medicine with others. What if I miss a dose? It is important not to miss your  dose. Call your doctor or health care professional if you are unable to keep an appointment. What may interact with this medicine? Do not take this medicine with any of the following medications: -other medicines containing denosumab This medicine may also interact with the following medications: -medicines that lower your chance of fighting infection -steroid medicines like prednisone or cortisone This list may not describe all possible interactions. Give your health care provider a list of all the medicines, herbs, non-prescription drugs, or dietary supplements you use. Also tell them if you smoke, drink alcohol, or use illegal drugs. Some items may interact with your medicine. What should I watch for while using this medicine? Visit your doctor or health care professional for regular checks on your progress. Your doctor or health care professional may order blood tests and other tests to see how you are doing. Call your doctor or health care professional for advice if you get a fever, chills or sore throat, or other symptoms of a cold or flu. Do not treat yourself. This drug may decrease your body's ability to fight infection. Try to avoid being around people who are sick. You should make sure you get enough calcium and vitamin D while you are taking this medicine, unless your doctor tells you not to. Discuss the foods you eat and the vitamins you take with your health care professional. See your dentist regularly. Brush and floss your teeth as directed. Before you have any dental work done, tell your dentist you are receiving this medicine. Do not become pregnant while taking this medicine or for 5 months after stopping  it. Talk with your doctor or health care professional about your birth control options while taking this medicine. Women should inform their doctor if they wish to become pregnant or think they might be pregnant. There is a potential for serious side effects to an unborn child. Talk  to your health care professional or pharmacist for more information. What side effects may I notice from receiving this medicine? Side effects that you should report to your doctor or health care professional as soon as possible: -allergic reactions like skin rash, itching or hives, swelling of the face, lips, or tongue -bone pain -breathing problems -dizziness -jaw pain, especially after dental work -redness, blistering, peeling of the skin -signs and symptoms of infection like fever or chills; cough; sore throat; pain or trouble passing urine -signs of low calcium like fast heartbeat, muscle cramps or muscle pain; pain, tingling, numbness in the hands or feet; seizures -unusual bleeding or bruising -unusually weak or tired Side effects that usually do not require medical attention (report to your doctor or health care professional if they continue or are bothersome): -constipation -diarrhea -headache -joint pain -loss of appetite -muscle pain -runny nose -tiredness -upset stomach This list may not describe all possible side effects. Call your doctor for medical advice about side effects. You may report side effects to FDA at 1-800-FDA-1088. Where should I keep my medicine? This medicine is only given in a clinic, doctor's office, or other health care setting and will not be stored at home. NOTE: This sheet is a summary. It may not cover all possible information. If you have questions about this medicine, talk to your doctor, pharmacist, or health care provider.  2018 Elsevier/Gold Standard (2016-08-21 19:17:21)   Fulvestrant injection What is this medicine? FULVESTRANT (ful VES trant) blocks the effects of estrogen. It is used to treat breast cancer. This medicine may be used for other purposes; ask your health care provider or pharmacist if you have questions. COMMON BRAND NAME(S): FASLODEX What should I tell my health care provider before I take this medicine? They need to  know if you have any of these conditions: -bleeding problems -liver disease -low levels of platelets in the blood -an unusual or allergic reaction to fulvestrant, other medicines, foods, dyes, or preservatives -pregnant or trying to get pregnant -breast-feeding How should I use this medicine? This medicine is for injection into a muscle. It is usually given by a health care professional in a hospital or clinic setting. Talk to your pediatrician regarding the use of this medicine in children. Special care may be needed. Overdosage: If you think you have taken too much of this medicine contact a poison control center or emergency room at once. NOTE: This medicine is only for you. Do not share this medicine with others. What if I miss a dose? It is important not to miss your dose. Call your doctor or health care professional if you are unable to keep an appointment. What may interact with this medicine? -medicines that treat or prevent blood clots like warfarin, enoxaparin, and dalteparin This list may not describe all possible interactions. Give your health care provider a list of all the medicines, herbs, non-prescription drugs, or dietary supplements you use. Also tell them if you smoke, drink alcohol, or use illegal drugs. Some items may interact with your medicine. What should I watch for while using this medicine? Your condition will be monitored carefully while you are receiving this medicine. You will need important blood work done while you   are taking this medicine. Do not become pregnant while taking this medicine or for at least 1 year after stopping it. Women of child-bearing potential will need to have a negative pregnancy test before starting this medicine. Women should inform their doctor if they wish to become pregnant or think they might be pregnant. There is a potential for serious side effects to an unborn child. Men should inform their doctors if they wish to father a child. This  medicine may lower sperm counts. Talk to your health care professional or pharmacist for more information. Do not breast-feed an infant while taking this medicine or for 1 year after the last dose. What side effects may I notice from receiving this medicine? Side effects that you should report to your doctor or health care professional as soon as possible: -allergic reactions like skin rash, itching or hives, swelling of the face, lips, or tongue -feeling faint or lightheaded, falls -pain, tingling, numbness, or weakness in the legs -signs and symptoms of infection like fever or chills; cough; flu-like symptoms; sore throat -vaginal bleeding Side effects that usually do not require medical attention (report to your doctor or health care professional if they continue or are bothersome): -aches, pains -constipation -diarrhea -headache -hot flashes -nausea, vomiting -pain at site where injected -stomach pain This list may not describe all possible side effects. Call your doctor for medical advice about side effects. You may report side effects to FDA at 1-800-FDA-1088. Where should I keep my medicine? This drug is given in a hospital or clinic and will not be stored at home. NOTE: This sheet is a summary. It may not cover all possible information. If you have questions about this medicine, talk to your doctor, pharmacist, or health care provider.  2018 Elsevier/Gold Standard (2015-02-25 11:03:55)  

## 2017-12-17 NOTE — Telephone Encounter (Signed)
GAVE AVS AND CALENDAR  °
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
## 2017-12-23 ENCOUNTER — Ambulatory Visit: Payer: Self-pay | Admitting: Student in an Organized Health Care Education/Training Program

## 2017-12-25 ENCOUNTER — Encounter: Payer: Self-pay | Admitting: Emergency Medicine

## 2017-12-25 ENCOUNTER — Ambulatory Visit: Payer: Medicare Other | Admitting: Emergency Medicine

## 2017-12-25 DIAGNOSIS — C7800 Secondary malignant neoplasm of unspecified lung: Secondary | ICD-10-CM

## 2017-12-25 DIAGNOSIS — J449 Chronic obstructive pulmonary disease, unspecified: Secondary | ICD-10-CM | POA: Diagnosis not present

## 2017-12-25 DIAGNOSIS — R05 Cough: Secondary | ICD-10-CM

## 2017-12-25 DIAGNOSIS — J9611 Chronic respiratory failure with hypoxia: Secondary | ICD-10-CM | POA: Diagnosis not present

## 2017-12-25 DIAGNOSIS — R053 Chronic cough: Secondary | ICD-10-CM

## 2017-12-25 NOTE — Assessment & Plan Note (Signed)
With contribution of allergic rhinitis.  We will continue to try and treat aggressively.

## 2017-12-25 NOTE — Assessment & Plan Note (Signed)
  Continue your oxygen at 2 to 4 L/min as you have been doing.

## 2017-12-25 NOTE — Progress Notes (Signed)
Subjective:    Patient ID: Suzanne [name], female    DOB: 16-Nov-1955, 62 y.o.   MRN: [id number]  Cough  Associated symptoms include shortness of breath. Pertinent negatives include no ear pain, eye redness, fever, headaches, postnasal drip, rash, rhinorrhea, sore throat or wheezing. Her past medical history is significant for asthma.  Asthma  She complains of cough and shortness of breath. There is no wheezing. Associated symptoms include appetite change. Pertinent negatives include no ear pain, fever, headaches, postnasal drip, rhinorrhea, sneezing, sore throat or trouble swallowing. Her past medical history is significant for asthma.   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  She is undergone right mastectomy and reconstruction, chemotherapy, targeted right axillary radiation.  She has been followed in our office for possible cough variant asthma.  She recently also saw Dr. [name] Gaskins.  She has been having significant increase in cough, copious secretions that are worst in the am, clear mucous to white. Also has some drainage from her head / sinuses. Happens again 3-4 pm. Her most recent imaging includes a PET scan from 05/02/17 that I have reviewed.  This shows some progressive foci of right lower lobe and right middle lobe atelectasis, right-sided pleural thickening and effusion without any clear hypermetabolism.  She currently has albuterol to use as needed. Uses about 1-2x a day. Feels that it helps sometimes. She hsa PFT ordered for next week. She has a lot of rhinitis, not on meds currently  ROV 07/11/17 --this is a follow-up visit for 62 year old woman, never smoker with history of stage IV breast cancer involving the left hilum, subpleural region, liver, bone.  She was seen for cough.  Imaging shows an elevated right hemidiaphragm and some pleural thickening, right hilar prominence and an  endobronchial lesion was considered.  Bronchoscopy has not yet been done.  We try to treat her for standard contributors to cough including rhinitis, continued GERD therapy.  She underwent pulmonary function testing on 07/02/17 that I have personally reviewed.  This shows evidence for mixed obstruction and restriction without a bronchodilator response.  Significantly decreased total lung capacity and a decreased diffusion capacity that corrects for alveolar volume. She has benefited some, especially from the claritin D. Unclear whether she has benefited from nasal steroid. She is dyspneic, hears wheezing. She is planning for a repeat PET scan beginning of 2019  ROV 10/10/17 --this is a follow-up visit for patient with a history of stage IV breast cancer involving the left hilum, subpleural region liver, bone.  I've seen her for cough.  Imaging shows a chronically elevated right hemidiaphragm with some pleural thickening.  She had pulmonary function testing that showed mixed obstruction and restriction.  We tried empiric Spiriva Respimat to see if she would benefit.  Her repeat PET scan was done on 08/20/17 and I have reviewed.  There may be some increased right apical groundglass infiltrate.  Right hemidiaphragm is elevated with some mild basilar atelectasis.  I do not see any evidence of a right hilar endobronchial lesion or airway obstruction.  There were hepatic nodules noted that were hypermetabolic. She is dealing w neck pain, arthritic pain. She remains on chemo with Dr Lindi Adie. She was found to be hypoxemic w exertion on a 6 minute walk. She is wearing 3-4L/min adjusted w exertion. She feels that she may have benefited from the spiriva, but still uses albuterol about 3x a day.  ROV 12/25/17 --   62 year old woman who follows up today for severe COPD, chronic hypoxemic respiratory failure.  She has a history of stage IV breast cancer that has involve the left hilum, subpleural region, liver, bone.  She  is on maintenance therapy, no restaging data since our last visit.  We tried changing Spiriva to Trelegy to see if she would prefer it, get more benefit.  She reports that she may have benefited, but she has had other confounding issues going on at the same time. She has been dealing with some severe neck to LUE pain, has had to start narcotics, muscle relaxer, has seen ortho. Difficulty tolerating the meds due to side effects. Her o2 is at 3-4L/min. She is doing a lot of coughing, productive of frothy white.  She is using albuterol 2-3x a day. She is on loratadine and flonase qd.  No overt flares. She has a PET with Dr Lindi Adie next month.                                                                                                                                 Review of Systems  Constitutional: Positive for appetite change and unexpected weight change. Negative for fever.  HENT: Positive for congestion. Negative for dental problem, ear pain, nosebleeds, postnasal drip, rhinorrhea, sinus pressure, sneezing, sore throat and trouble swallowing.   Eyes: Negative for redness and itching.  Respiratory: Positive for cough and shortness of breath. Negative for chest tightness and wheezing.   Cardiovascular: Negative for palpitations and leg swelling.  Gastrointestinal: Negative for nausea and vomiting.  Genitourinary: Negative for dysuria.  Musculoskeletal: Negative for joint swelling.  Skin: Negative for rash.  Neurological: Negative for headaches.  Hematological: Does not bruise/bleed easily.  Psychiatric/Behavioral: Negative for dysphoric mood. The patient is not nervous/anxious.     Past Medical History:  Diagnosis Date  . Alcoholism ([place])   . Cancer [facility])    breast ca - 1994; recurred in 2007. s/p masectomy with flap rconstruction aand chemo '94. local recurrence on chest wall. chemo and XRT '07. now femara  . Depression   . Dyspnea    myoview 2011: EF 55% question of  mild reverible  anterior defect. thought to be breast attenuation. echo 45-50% with global HK. Grade 1 diastolyic dysfunction. RV nml. cardiac MRI with EF 44% with septal HK. no scar   . History of coronary artery stent placement   . Hyperlipidemia   . Hypertension   . Insomnia   . Lung disorder      Family History  Problem Relation Age of Onset  . Hypertension Mother   . Heart attack Father   . Alcohol abuse Father   . Coronary artery disease Unknown        family hx  . Hyperlipidemia Unknown        family hx  . Thyroid disease Unknown        family  hx   . Alcohol abuse Paternal Grandfather   . Alcohol abuse Paternal Uncle   . Cancer Paternal Grandmother        breast  . Alcohol abuse Brother      Social History   Socioeconomic History  . Marital status: Married    Spouse name: Not on file  . Number of children: Not on file  . Years of education: Not on file  . Highest education level: Not on file  Occupational History  . Occupation: Disabilty  Social Needs  . Financial resource strain: Not on file  . Food insecurity:    Worry: Not on file    Inability: Not on file  . Transportation needs:    Medical: Not on file    Non-medical: Not on file  Tobacco Use  . Smoking status: Never Smoker  . Smokeless tobacco: Never Used  . Tobacco comment: just socially   Substance and Sexual Activity  . Alcohol use: Yes    Comment: No alcohol since 05/22/2012  . Drug use: No  . Sexual activity: Yes    Birth control/protection: Surgical  Lifestyle  . Physical activity:    Days per week: Not on file    Minutes per session: Not on file  . Stress: Not on file  Relationships  . Social connections:    Talks on phone: Not on file    Gets together: Not on file    Attends religious service: Not on file    Active member of club or organization: Not on file    Attends meetings of clubs or organizations: Not on file    Relationship status: Not on file  . Intimate partner violence:    Fear of  current or ex partner: Not on file    Emotionally abused: Not on file    Physically abused: Not on file    Forced sexual activity: Not on file  Other Topics Concern  . Not on file  Social History Narrative   Suzanne [name] was born in [place] and lived there until high school, when she moved to [facility]. She graduated from the [employer] with a bachelor of science in nursing. 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 She has been married for 27 years, and has 2 children, a 57 year old son, and a 52 year old daughter. She denies any legal difficulties. She associates as a Engineer, manufacturing.      Allergies  Allergen Reactions  . Ace Inhibitors     cough     Outpatient Medications Prior to Visit  Medication Sig Dispense Refill  . albuterol (PROAIR HFA) 108 (90 Base) MCG/ACT inhaler INHALE 1 PUFF BY MOUTH EVERY 6 HOURS AS NEEDED FOR WHEEZING OR SHORTNESS OF BREATH 8.5 g 6  . albuterol (PROVENTIL) (2.5 MG/3ML) 0.083% nebulizer solution INHALE 1 VIAL VIA NEBULIZER EVERY 6 HOURS AS NEEDED FOR WHEEZING OR SHORTNESS OF BREATH 90 mL 9  . bisoprolol (ZEBETA) 5 MG tablet TAKE 1 TABLET (5 MG TOTAL) BY MOUTH DAILY. 30 tablet 2  . calcium carbonate (OS-CAL - DOSED IN MG OF ELEMENTAL CALCIUM) 1250 (500 Ca) MG tablet Take 1 tablet by mouth.    . celecoxib (CELEBREX) 200 MG capsule Take 1 capsule (200 mg total) by mouth 2 (two) times daily as needed. 60 capsule 0  . cholecalciferol (VITAMIN D) 1000 units tablet Take 1,000 Units by mouth daily.    . fluticasone (FLONASE)  50 MCG/ACT nasal spray Place 2 sprays into both nostrils daily. 16 g 5  . Fluticasone-Umeclidin-Vilant (TRELEGY ELLIPTA) 100-62.5-25 MCG/INH AEPB Inhale 1 puff into the lungs daily. 1 each 3  . HYDROmorphone (DILAUDID) 2 MG tablet Take 0.5 tablets (1 mg total) by mouth every 4 (four) hours as needed for severe pain. 30 tablet 0  . ibuprofen (ADVIL,MOTRIN) 200 MG  tablet Take 200 mg by mouth every 6 (six) hours as needed.    Marland Kitchen letrozole (FEMARA) 2.5 MG tablet TAKE 1 TABLET BY MOUTH ONCE DAILY 90 tablet 1  . loratadine (CLARITIN) 10 MG tablet Take 1 tablet (10 mg total) by mouth daily. 30 tablet 5  . LORazepam (ATIVAN) 2 MG tablet TAKE 1/2 TO 1 TABLET BY MOUTH EACH NIGHT AT BEDTIME 30 tablet 5  . palbociclib (IBRANCE) 125 MG capsule Take 1 capsule (125 mg total) by mouth daily with breakfast. Take whole with food for 21 days on, 7 days off, repeat every 28 days. 21 capsule 6  . prochlorperazine (COMPAZINE) 10 MG tablet Take 1 tablet (10 mg total) by mouth every 6 (six) hours as needed for nausea or vomiting. 30 tablet 0  . promethazine (PHENERGAN) 25 MG tablet Take 1 tablet (25 mg total) by mouth every 6 (six) hours as needed for nausea. 30 tablet 3  . tiZANidine (ZANAFLEX) 4 MG tablet Take 1 tablet (4 mg total) by mouth 2 (two) times daily as needed for muscle spasms. 60 tablet 1  . gabapentin (NEURONTIN) 300 MG capsule Take 1 capsule (300 mg total) by mouth 3 (three) times daily. One twice daily (Patient not taking: Reported on 12/16/2017) 90 capsule 3  . Tiotropium Bromide Monohydrate (SPIRIVA RESPIMAT) 2.5 MCG/ACT AERS Inhale 2 puffs into the lungs daily. (Patient not taking: Reported on 11/20/2017) 1 Inhaler 5  . traZODone (DESYREL) 50 MG tablet Take 1 tablet (50 mg total) by mouth at bedtime. (Patient not taking: Reported on 12/16/2017) 30 tablet 0   No facility-administered medications prior to visit.         Objective:   Physical Exam  Vitals:   12/25/17 1325  BP: 120/86  Pulse: (!) 120  SpO2: 95%  Weight: 179 lb (81.2 kg)  Height: 5\' 5"  (1.651 m)   Gen: Pleasant, well-nourished, in no distress,  normal affect  ENT: No lesions,  mouth clear,  oropharynx clear, no postnasal drip  Neck: No JVD, no stridor  Lungs: No use of accessory muscles, decreased at R base, few scattered insp and exp wheezes.   Cardiovascular: RRR, heart sounds normal,  no murmur or gallops, no peripheral edema  Musculoskeletal: No deformities, no cyanosis or clubbing  Neuro: alert, non focal  Skin: Warm, no lesions or rashes    Assessment & Plan:  COPD with asthma ([place]) Please continue Trelegy 1 inhalation once a day.  You can do a 3-week trial of going back to your Spiriva once daily to see if you lose ground, have any changes in your breathing.  Please call us to let us know what you determine so that we can decide which inhaler we are going to continue long-term. Keep albuterol available to use either 1 nebulizer or 2 puffs up to every 4 hours as needed shortness of breath, cough, wheezing, chest tightness. .   Lung metastases ([place]) History of stage IV breast cancer.  I am concerned about her back pain, visits to orthopedics.  Need to make sure that there is no evidence of bony disease.  She has a PET scan and follow-up with Dr. Lindi Adie planned  Chronic hypoxemic respiratory failure ([place])  Continue your oxygen at 2 to 4 L/min as you have been doing.   Chronic cough With contribution of allergic rhinitis.  We will continue to try and treat aggressively.  Baltazar Apo, MD, PhD 12/25/2017, 1:50 PM [facility] [phone] or if no answer [phone]

## 2017-12-25 NOTE — Patient Instructions (Signed)
Please continue Trelegy 1 inhalation once a day.  You can do a 3-week trial of going back to your Spiriva once daily to see if you lose ground, have any changes in your breathing.  Please call us to let us know what you determine so that we can decide which inhaler we are going to continue long-term. Keep albuterol available to use either 1 nebulizer or 2 puffs up to every 4 hours as needed shortness of breath, cough, wheezing, chest tightness. Continue your oxygen at 2 to 4 L/min as you have been doing. Please continue loratadine and Flonase nasal spray as you have been taking them. Please follow with Dr. Lindi Adie and get your PET scan as planned. Follow with Dr Lamonte Sakai in 3 months or sooner if you have any problems.

## 2017-12-25 NOTE — Assessment & Plan Note (Signed)
History of stage IV breast cancer.  I am concerned about her back pain, visits to orthopedics.  Need to make sure that there is no evidence of bony disease.  She has a PET scan and follow-up with Dr. Lindi Adie planned

## 2017-12-25 NOTE — Assessment & Plan Note (Signed)
Please continue Trelegy 1 inhalation once a day.  You can do a 3-week trial of going back to your Spiriva once daily to see if you lose ground, have any changes in your breathing.  Please call us to let us know what you determine so that we can decide which inhaler we are going to continue long-term. Keep albuterol available to use either 1 nebulizer or 2 puffs up to every 4 hours as needed shortness of breath, cough, wheezing, chest tightness. Marland Kitchen

## 2017-12-26 MED FILL — LORazepam 2 MG TABS: 2 | 30 days supply | Qty: 30 | Fill #3

## 2018-01-02 ENCOUNTER — Other Ambulatory Visit: Payer: Self-pay

## 2018-01-02 ENCOUNTER — Other Ambulatory Visit: Payer: Self-pay | Admitting: Hematology and Oncology

## 2018-01-02 DIAGNOSIS — C7800 Secondary malignant neoplasm of unspecified lung: Secondary | ICD-10-CM

## 2018-01-02 MED ORDER — TIZANIDINE HCL 4 MG PO TABS: 4.0000 mg | ORAL_TABLET | Freq: Two times a day (BID) | ORAL | 1 refills | Status: DC | PRN

## 2018-01-02 MED ORDER — CELECOXIB 200 MG PO CAPS: 200.0000 mg | ORAL_CAPSULE | Freq: Two times a day (BID) | ORAL | 0 refills | Status: DC | PRN

## 2018-01-02 MED ORDER — PROCHLORPERAZINE MALEATE 10 MG PO TABS: 10.0000 mg | ORAL_TABLET | Freq: Four times a day (QID) | ORAL | 0 refills | Status: DC | PRN

## 2018-01-02 MED ORDER — PROMETHAZINE HCL 25 MG PO TABS: 25.0000 mg | ORAL_TABLET | Freq: Four times a day (QID) | ORAL | 3 refills | Status: DC | PRN

## 2018-01-02 MED FILL — CELECOXIB 200 MG CAPSULE: 200 | 30 days supply | Qty: 60 | Fill #0

## 2018-01-02 MED FILL — FLUTICASONE PROP 50 MCG SPR: 50 | 30 days supply | Qty: 16 | Fill #2

## 2018-01-02 MED FILL — PROMETHAZINE 25 MG TABLET: 25 | 5 days supply | Qty: 30 | Fill #0

## 2018-01-02 MED FILL — PROCHLORPERAZINE 10 MG TAB: 10 | 5 days supply | Qty: 30 | Fill #0

## 2018-01-02 MED FILL — tiZANidine HCL 4 MG TABS: 4 | 30 days supply | Qty: 60 | Fill #0

## 2018-01-02 NOTE — Telephone Encounter (Signed)
Noted refill request from pt's pharmacy ([facility]) for Phenergan.  Outgoing call to pt and she reports she takes the prescribed Compazine or Phenergan, alternates these as needed, as ordered for nausea due to [place]. Refills submitted. No other needs per pt at this time. Reminded her of next appt with Dr Lindi Adie on 01/21/2018.

## 2018-01-02 NOTE — Telephone Encounter (Signed)
LVM INFORMING PT THAT HER RX HAVE BEEN SENT TO PHARMACY TO BE FILLED. MAY 23RD BY DD @ 11:20AM

## 2018-01-02 NOTE — Telephone Encounter (Signed)
Celebrex and Zanaflex refill request. Last office visit on 12/16/17, next office visit on 06/30/18.

## 2018-01-07 ENCOUNTER — Encounter (HOSPITAL_COMMUNITY): Payer: Medicare Other

## 2018-01-07 MED FILL — PROAIR HFA 90 MCG INHALER: 108 (90 BAS | 25 days supply | Qty: 9 | Fill #6

## 2018-01-09 ENCOUNTER — Other Ambulatory Visit: Payer: Self-pay | Admitting: Hematology and Oncology

## 2018-01-10 ENCOUNTER — Telehealth: Payer: Self-pay | Admitting: *Deleted

## 2018-01-10 ENCOUNTER — Other Ambulatory Visit: Payer: Self-pay

## 2018-01-10 ENCOUNTER — Ambulatory Visit (HOSPITAL_COMMUNITY): Payer: Medicare Other

## 2018-01-10 DIAGNOSIS — C50511 Malignant neoplasm of lower-outer quadrant of right female breast: Secondary | ICD-10-CM

## 2018-01-10 DIAGNOSIS — C7800 Secondary malignant neoplasm of unspecified lung: Secondary | ICD-10-CM

## 2018-01-10 DIAGNOSIS — C7951 Secondary malignant neoplasm of bone: Secondary | ICD-10-CM

## 2018-01-10 MED ORDER — HYDROMORPHONE HCL 2 MG PO TABS: 1.0000 mg | ORAL_TABLET | ORAL | 0 refills | Status: DC | PRN

## 2018-01-10 MED FILL — HYDROmorphone HCL 2 MG TABS: 2 | 10 days supply | Qty: 30 | Fill #0

## 2018-01-10 NOTE — Telephone Encounter (Signed)
Returned pt's call regarding her Dilaudid refill and Dr Lindi Adie completed refill.  No answer at her home or mobile number, left a VM for her to let her know prescription is available for pick up at [facility]

## 2018-01-10 NOTE — Telephone Encounter (Signed)
"  I've contacted my pharmacy for dilaudid request but they have not received order from Dr. Lindi Adie."  Shared prescription will need to be picked up from our office.  Transferred to collaborative for further help with this request.

## 2018-01-15 ENCOUNTER — Encounter (HOSPITAL_COMMUNITY): Payer: Medicare Other

## 2018-01-17 ENCOUNTER — Encounter (HOSPITAL_COMMUNITY)
Admission: RE | Admit: 2018-01-17 | Discharge: 2018-01-17 | Disposition: A | Discharge status: Home / Self Care | Payer: Medicare Other | Source: Ambulatory Visit | Attending: Hematology and Oncology | Admitting: Hematology and Oncology

## 2018-01-17 DIAGNOSIS — J449 Chronic obstructive pulmonary disease, unspecified: Secondary | ICD-10-CM | POA: Diagnosis not present

## 2018-01-17 DIAGNOSIS — Z170 Estrogen receptor positive status [ER+]: Secondary | ICD-10-CM | POA: Diagnosis not present

## 2018-01-17 DIAGNOSIS — C50511 Malignant neoplasm of lower-outer quadrant of right female breast: Secondary | ICD-10-CM | POA: Diagnosis not present

## 2018-01-17 DIAGNOSIS — C50919 Malignant neoplasm of unspecified site of unspecified female breast: Secondary | ICD-10-CM | POA: Diagnosis not present

## 2018-01-17 LAB — GLUCOSE, CAPILLARY: Glucose-Capillary: 151 mg/dL — ABNORMAL HIGH (ref 65–99)

## 2018-01-17 MED ORDER — FLUDEOXYGLUCOSE F - 18 (FDG) INJECTION
10.5000 | Freq: Once | INTRAVENOUS | Status: AC | PRN
  Administered 2018-01-17: 10.5 via INTRAVENOUS

## 2018-01-20 ENCOUNTER — Other Ambulatory Visit: Payer: Self-pay

## 2018-01-20 DIAGNOSIS — C7951 Secondary malignant neoplasm of bone: Secondary | ICD-10-CM

## 2018-01-20 DIAGNOSIS — C7800 Secondary malignant neoplasm of unspecified lung: Secondary | ICD-10-CM

## 2018-01-20 DIAGNOSIS — C50511 Malignant neoplasm of lower-outer quadrant of right female breast: Secondary | ICD-10-CM

## 2018-01-21 ENCOUNTER — Inpatient Hospital Stay: Payer: Medicare Other | Attending: Hematology and Oncology | Admitting: Hematology and Oncology

## 2018-01-21 ENCOUNTER — Inpatient Hospital Stay: Payer: Medicare Other

## 2018-01-21 VITALS — BP 95/63 | HR 64 | Temp 97.5°F | Resp 17 | Ht 65.0 in | Wt 178.4 lb

## 2018-01-21 DIAGNOSIS — D696 Thrombocytopenia, unspecified: Secondary | ICD-10-CM | POA: Insufficient documentation

## 2018-01-21 DIAGNOSIS — R5383 Other fatigue: Secondary | ICD-10-CM | POA: Insufficient documentation

## 2018-01-21 DIAGNOSIS — R062 Wheezing: Secondary | ICD-10-CM

## 2018-01-21 DIAGNOSIS — R0602 Shortness of breath: Secondary | ICD-10-CM | POA: Diagnosis not present

## 2018-01-21 DIAGNOSIS — C7801 Secondary malignant neoplasm of right lung: Secondary | ICD-10-CM | POA: Diagnosis not present

## 2018-01-21 DIAGNOSIS — C7951 Secondary malignant neoplasm of bone: Secondary | ICD-10-CM | POA: Diagnosis not present

## 2018-01-21 DIAGNOSIS — R7989 Other specified abnormal findings of blood chemistry: Secondary | ICD-10-CM | POA: Insufficient documentation

## 2018-01-21 DIAGNOSIS — C50511 Malignant neoplasm of lower-outer quadrant of right female breast: Secondary | ICD-10-CM

## 2018-01-21 DIAGNOSIS — C7800 Secondary malignant neoplasm of unspecified lung: Secondary | ICD-10-CM

## 2018-01-21 DIAGNOSIS — Z170 Estrogen receptor positive status [ER+]: Secondary | ICD-10-CM | POA: Insufficient documentation

## 2018-01-21 DIAGNOSIS — R05 Cough: Secondary | ICD-10-CM | POA: Diagnosis not present

## 2018-01-21 DIAGNOSIS — Z923 Personal history of irradiation: Secondary | ICD-10-CM | POA: Insufficient documentation

## 2018-01-21 DIAGNOSIS — Z9221 Personal history of antineoplastic chemotherapy: Secondary | ICD-10-CM

## 2018-01-21 DIAGNOSIS — Z5111 Encounter for antineoplastic chemotherapy: Secondary | ICD-10-CM | POA: Diagnosis not present

## 2018-01-21 DIAGNOSIS — Z79811 Long term (current) use of aromatase inhibitors: Secondary | ICD-10-CM | POA: Diagnosis not present

## 2018-01-21 DIAGNOSIS — D709 Neutropenia, unspecified: Secondary | ICD-10-CM | POA: Diagnosis not present

## 2018-01-21 DIAGNOSIS — C787 Secondary malignant neoplasm of liver and intrahepatic bile duct: Secondary | ICD-10-CM | POA: Diagnosis not present

## 2018-01-21 LAB — CBC WITH DIFFERENTIAL (CANCER CENTER ONLY)
BASOS PCT: 1 %
Basophils Absolute: 0 10*3/uL (ref 0.0–0.1)
EOS ABS: 0 10*3/uL (ref 0.0–0.5)
EOS PCT: 1 %
HCT: 33.2 % — ABNORMAL LOW (ref 34.8–46.6)
HEMOGLOBIN: 11.8 g/dL (ref 11.6–15.9)
Lymphocytes Relative: 29 %
Lymphs Abs: 0.9 10*3/uL (ref 0.9–3.3)
MCH: 45 pg — ABNORMAL HIGH (ref 25.1–34.0)
MCHC: 35.5 g/dL (ref 31.5–36.0)
MCV: 126.7 fL — ABNORMAL HIGH (ref 79.5–101.0)
MONOS PCT: 21 %
Monocytes Absolute: 0.7 10*3/uL (ref 0.1–0.9)
NEUTROS PCT: 48 %
Neutro Abs: 1.5 10*3/uL (ref 1.5–6.5)
PLATELETS: 71 10*3/uL — AB (ref 145–400)
RBC: 2.62 MIL/uL — AB (ref 3.70–5.45)
RDW: 12.9 % (ref 11.2–14.5)
WBC: 3.1 10*3/uL — AB (ref 3.9–10.3)

## 2018-01-21 LAB — CMP (CANCER CENTER ONLY)
ALK PHOS: 106 U/L (ref 40–150)
ALT: 84 U/L — AB (ref 0–55)
AST: 136 U/L — ABNORMAL HIGH (ref 5–34)
Albumin: 3.4 g/dL — ABNORMAL LOW (ref 3.5–5.0)
Anion gap: 9 (ref 3–11)
BUN: 11 mg/dL (ref 7–26)
CALCIUM: 8.7 mg/dL (ref 8.4–10.4)
CO2: 31 mmol/L — ABNORMAL HIGH (ref 22–29)
CREATININE: 1.01 mg/dL (ref 0.60–1.10)
Chloride: 97 mmol/L — ABNORMAL LOW (ref 98–109)
GFR, EST NON AFRICAN AMERICAN: 59 mL/min — AB (ref >60)
Glucose, Bld: 232 mg/dL — ABNORMAL HIGH (ref 70–140)
Potassium: 3.9 mmol/L (ref 3.5–5.1)
Sodium: 137 mmol/L (ref 136–145)
Total Bilirubin: 0.5 mg/dL (ref 0.2–1.2)
Total Protein: 7.3 g/dL (ref 6.4–8.3)

## 2018-01-21 MED ORDER — FULVESTRANT 250 MG/5ML IM SOLN
500.0000 mg | Freq: Once | INTRAMUSCULAR | Status: AC
  Administered 2018-01-21: 500 mg via INTRAMUSCULAR

## 2018-01-21 MED ORDER — FULVESTRANT 250 MG/5ML IM SOLN
INTRAMUSCULAR | Status: AC
  Filled 2018-01-21: qty 5

## 2018-01-21 MED ORDER — HYDROMORPHONE HCL 2 MG PO TABS: 2.0000 mg | ORAL_TABLET | ORAL | 0 refills | Status: DC | PRN

## 2018-01-21 MED FILL — HYDROmorphone HCL 2 MG TABS: 2 | 10 days supply | Qty: 60 | Fill #0

## 2018-01-21 MED FILL — LORazepam 2 MG TABS: 2 | 30 days supply | Qty: 30 | Fill #4
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
## 2018-01-21 NOTE — Patient Instructions (Signed)
Fulvestrant injection What is this medicine? FULVESTRANT (ful VES trant) blocks the effects of estrogen. It is used to treat breast cancer. This medicine may be used for other purposes; ask your health care provider or pharmacist if you have questions. What should I tell my health care provider before I take this medicine? They need to know if you have any of these conditions: -bleeding problems -liver disease -low levels of platelets in the blood -an unusual or allergic reaction to fulvestrant, other medicines, foods, dyes, or preservatives -pregnant or trying to get pregnant -breast-feeding How should I use this medicine? This medicine is for injection into a muscle. It is usually given by a health care professional in a hospital or clinic setting. Talk to your pediatrician regarding the use of this medicine in children. Special care may be needed. Overdosage: If you think you have taken too much of this medicine contact a poison control center or emergency room at once. NOTE: This medicine is only for you. Do not share this medicine with others. What if I miss a dose? It is important not to miss your dose. Call your doctor or health care professional if you are unable to keep an appointment. What may interact with this medicine? -medicines that treat or prevent blood clots like warfarin, enoxaparin, and dalteparin This list may not describe all possible interactions. Give your health care provider a list of all the medicines, herbs, non-prescription drugs, or dietary supplements you use. Also tell them if you smoke, drink alcohol, or use illegal drugs. Some items may interact with your medicine. What should I watch for while using this medicine? Your condition will be monitored carefully while you are receiving this medicine. You will need important blood work done while you are taking this medicine. Do not become pregnant while taking this medicine or for at least 1 year after stopping  it. Women of child-bearing potential will need to have a negative pregnancy test before starting this medicine. Women should inform their doctor if they wish to become pregnant or think they might be pregnant. There is a potential for serious side effects to an unborn child. Men should inform their doctors if they wish to father a child. This medicine may lower sperm counts. Talk to your health care professional or pharmacist for more information. Do not breast-feed an infant while taking this medicine or for 1 year after the last dose. What side effects may I notice from receiving this medicine? Side effects that you should report to your doctor or health care professional as soon as possible: -allergic reactions like skin rash, itching or hives, swelling of the face, lips, or tongue -feeling faint or lightheaded, falls -pain, tingling, numbness, or weakness in the legs -signs and symptoms of infection like fever or chills; cough; flu-like symptoms; sore throat -vaginal bleeding Side effects that usually do not require medical attention (report to your doctor or health care professional if they continue or are bothersome): -aches, pains -constipation -diarrhea -headache -hot flashes -nausea, vomiting -pain at site where injected -stomach pain This list may not describe all possible side effects. Call your doctor for medical advice about side effects. You may report side effects to FDA at 1-800-FDA-1088. Where should I keep my medicine? This drug is given in a hospital or clinic and will not be stored at home. NOTE: This sheet is a summary. It may not cover all possible information. If you have questions about this medicine, talk to your doctor, pharmacist, or health   care provider.    2016, Elsevier/Gold Standard. (2015-02-25 11:03:55)  Denosumab injection What is this medicine? DENOSUMAB (den oh sue mab) slows bone breakdown. Prolia is used to treat osteoporosis in women after menopause  and in men. Xgeva is used to prevent bone fractures and other bone problems caused by cancer bone metastases. Xgeva is also used to treat giant cell tumor of the bone. This medicine may be used for other purposes; ask your health care provider or pharmacist if you have questions. What should I tell my health care provider before I take this medicine? They need to know if you have any of these conditions: -dental disease -eczema -infection or history of infections -kidney disease or on dialysis -low blood calcium or vitamin D -malabsorption syndrome -scheduled to have surgery or tooth extraction -taking medicine that contains denosumab -thyroid or parathyroid disease -an unusual reaction to denosumab, other medicines, foods, dyes, or preservatives -pregnant or trying to get pregnant -breast-feeding How should I use this medicine? This medicine is for injection under the skin. It is given by a health care professional in a hospital or clinic setting. If you are getting Prolia, a special MedGuide will be given to you by the pharmacist with each prescription and refill. Be sure to read this information carefully each time. For Prolia, talk to your pediatrician regarding the use of this medicine in children. Special care may be needed. For Xgeva, talk to your pediatrician regarding the use of this medicine in children. While this drug may be prescribed for children as young as 13 years for selected conditions, precautions do apply. Overdosage: If you think you have taken too much of this medicine contact a poison control center or emergency room at once. NOTE: This medicine is only for you. Do not share this medicine with others. What if I miss a dose? It is important not to miss your dose. Call your doctor or health care professional if you are unable to keep an appointment. What may interact with this medicine? Do not take this medicine with any of the following medications: -other medicines  containing denosumab This medicine may also interact with the following medications: -medicines that suppress the immune system -medicines that treat cancer -steroid medicines like prednisone or cortisone This list may not describe all possible interactions. Give your health care provider a list of all the medicines, herbs, non-prescription drugs, or dietary supplements you use. Also tell them if you smoke, drink alcohol, or use illegal drugs. Some items may interact with your medicine. What should I watch for while using this medicine? Visit your doctor or health care professional for regular checks on your progress. Your doctor or health care professional may order blood tests and other tests to see how you are doing. Call your doctor or health care professional if you get a cold or other infection while receiving this medicine. Do not treat yourself. This medicine may decrease your body's ability to fight infection. You should make sure you get enough calcium and vitamin D while you are taking this medicine, unless your doctor tells you not to. Discuss the foods you eat and the vitamins you take with your health care professional. See your dentist regularly. Brush and floss your teeth as directed. Before you have any dental work done, tell your dentist you are receiving this medicine. Do not become pregnant while taking this medicine or for 5 months after stopping it. Women should inform their doctor if they wish to become pregnant or think   they might be pregnant. There is a potential for serious side effects to an unborn child. Talk to your health care professional or pharmacist for more information. What side effects may I notice from receiving this medicine? Side effects that you should report to your doctor or health care professional as soon as possible: -allergic reactions like skin rash, itching or hives, swelling of the face, lips, or tongue -breathing problems -chest pain -fast,  irregular heartbeat -feeling faint or lightheaded, falls -fever, chills, or any other sign of infection -muscle spasms, tightening, or twitches -numbness or tingling -skin blisters or bumps, or is dry, peels, or red -slow healing or unexplained pain in the mouth or jaw -unusual bleeding or bruising Side effects that usually do not require medical attention (Report these to your doctor or health care professional if they continue or are bothersome.): -muscle pain -stomach upset, gas This list may not describe all possible side effects. Call your doctor for medical advice about side effects. You may report side effects to FDA at 1-800-FDA-1088. Where should I keep my medicine? This medicine is only given in a clinic, doctor's office, or other health care setting and will not be stored at home. NOTE: This sheet is a summary. It may not cover all possible information. If you have questions about this medicine, talk to your doctor, pharmacist, or health care provider.    2016, Elsevier/Gold Standard. (2012-01-28 12:37:47)   

## 2018-01-22 ENCOUNTER — Telehealth: Payer: Self-pay | Admitting: Hematology and Oncology

## 2018-01-22 NOTE — Telephone Encounter (Signed)
Spoke with patient regarding added appts per 6/11 los.  Appt made for 7/11 for lab/VG/inj beginning at 2:15 pm.

## 2018-01-23 ENCOUNTER — Telehealth: Payer: Self-pay | Admitting: Pharmacy Technician

## 2018-01-23 ENCOUNTER — Telehealth: Payer: Self-pay | Admitting: Pharmacist

## 2018-01-23 NOTE — Telephone Encounter (Signed)
Oral Oncology Pharmacist Encounter  Received new prescription for Piqray (alpelisib) for the treatment of metastatic, hormone receptor positive breast cancer, previously treated with endocrine based therapy, with known PIK3CA mutation, in conjunction with Faslodex, planned duration until disease progression or unacceptable toxicity.  Labs from Epic assessed Noted elevated blood glucose, this will be closely monitored at [facility] initiation, Piqray use is associated with a strong risk of hyperglycemia Noted some elevations in LFTs, bilirubin remains WNL, no change to current therapy indicated at this time. Noted thrombocytopenia, presumably due to [facility] treatment No change to current therapy indicated at this time, CBC will continue to be monitored  Current medication list in Epic reviewed, non-significant DDI between [facility] and celecoxib identified:  Joylene Draft is an inducer of CYP2C9, leading to increased metabolism and possible decreased systemic exposure to celecoxib. Patient with multiple pain medications on current med list. We will discuss pain management at initial counseling session.  Prescription will be sent to appropriate specialty pharmacy once insurance authorization is obtained. We have confirmed with the [facility] that they have access to dispense [place].  Patient with existing copayment grant to help cover any out of pocket costs that may be assisted with Piqray. Copayment grant is provided through [employer] (TAF). Joylene Draft is not yet on their formulary of covered medications. We are currently working with grant foundation to allow grant to also cover costs for this medication.  Joylene Draft is also not currently available to order from EMR.  IT has been notified.  Oral Oncology Clinic will continue to follow for insurance authorization, copayment issues, initial counseling and start date.  Johny Drilling, PharmD, BCPS, BCOP  01/23/2018 9:49 AM Oral Oncology  Clinic [phone]

## 2018-01-23 NOTE — Telephone Encounter (Signed)
[name]  Received notification from [facility] that prior authorization for Suzanne [name] is required.  PA submitted on CoverMyMeds Key [id number] Status is pending  Oral Oncology Clinic will continue to follow.  Suzanne [name]. Suzanne [name], [place] Patient [place] [phone] 01/23/2018 10:49 AM

## 2018-01-24 NOTE — Telephone Encounter (Signed)
Oral Oncology Pharmacist Encounter  Received notification from [employer] (TAF) that [place] (alpelisib) has been added to their formulary list of covered medications for breast cancer. Patient will be able to use existing copayment grant on [place] expenses for the remainder of the calendar year.  Insurance authorization has been approved. Test claim at the pharmacy revealed copayment $828.21 Copayment will be fully covered by TAF.  Received notification from IT that [place] was added to software with June Medispan update. It will be available to order through EMR once this load is moved into production. This should occur by 01/28/18 at the latest. Joylene Draft will be available for ordering through EMR at that time. MD is able to write hand-written prescription for Piqray if it is needed to be started earlier.  We will follow-up with MD about planned start date.  Oral Oncology Clinic will continue to follow with patient for initial counseling and medication acquisition.  Johny Drilling, PharmD, BCPS, BCOP  01/24/2018 8:29 AM Oral Oncology Clinic [phone]

## 2018-01-24 NOTE — Telephone Encounter (Signed)
[name]  Prior Authorization for Suzanne [name] Draft has been approved.    PA# [id number] Effective dates: 01/24/2018 through 08/12/2018  Oral Oncology Clinic will continue to follow.   Fabio Asa. Melynda Keller, [place] Patient [place] [phone] 01/24/2018 8:55 AM

## 2018-01-24 NOTE — Telephone Encounter (Signed)
Oral Chemotherapy Pharmacist Encounter   I spoke with patient for overview of new oral chemotherapy medication: Piqray (alpelisib).  Last dose of Ibrance: 01/21/18  Counseled on administration, dosing, side effects, monitoring, drug-food interactions, safe handling, storage, and disposal.  Patient will take Piqray 150mg  tablets, 2 tablets (300 mg) by mouth once daily with food.  Patient is already maintained on fulvestrant injections.  Fulvestrant start date: 05/07/16 Piqray start date: TBD, pending medication acquisition, likely week of 01/27/18  Side effects include but not limited to: hyperglycemia, rash/hypersensitivity reaction, diarrhea, fatigue, nausea, vomiting, mouth sores, alopecia, taste changes, decreased appetite, severe lung reactions, and arthralgias.  Patient has anti-emetic on hand and knows to take it if nausea develops.   Patient will obtain anti diarrheal and alert the office of 4 or more loose stools above baseline.  Discussed with patient need for glucose control prior to May initiation, and possible need for anti-diabetic agents for the treatment of medication-induced hyperglycemia while on treatment with Piqray.   Patient is agreeable to being started on metformin prior to [facility] initiation for baseline hyperglycemia. This will be discussed with MD.  Discussed possibility of severe cutaneous reactions with Piqray. Patient will alert the office of any dermatologic toxicity that may occur after Piqray initiation.  Patient stated she currently has a rash on the back of her neck, shoulders, and tops of both hips. She states she has has it for several days, but that it is getting better.  Reviewed with patient importance of keeping a medication schedule and plan for any missed doses.  Ms. Bechtel voiced understanding and appreciation.   All questions answered. Medication reconciliation performed and medication/allergy list updated.  Patient updated about  insurance authorization and copayment. Patient informed current copayment grant will cover out of pocket expenses for [place].  Patient updated that Joylene Draft will be ordered for dispensing at the [place] once the order can be sent through EMR, likely 01/28/18.  Patient knows to call the office with questions or concerns. Oral Oncology Clinic will continue to follow.  Thank you,  Johny Drilling, PharmD, BCPS, BCOP  01/24/2018   3:08 PM Oral Oncology Clinic [phone]

## 2018-01-27 ENCOUNTER — Other Ambulatory Visit: Payer: Self-pay | Admitting: Pharmacist

## 2018-01-28 ENCOUNTER — Other Ambulatory Visit: Payer: Self-pay | Admitting: Pharmacist

## 2018-01-28 NOTE — Telephone Encounter (Signed)
Oral Oncology Pharmacist Encounter  Medispan load not yet updated into EMR. Not yet able to order Piqray to dispensing pharmacy. I called and updated patient about status. Likely pick-up at [facility] on Thursday 01/30/18. We discussed starting metformin 500mg  once daily as well. I will send this Rx to [facility] when I am able to send [facility] Rx. Patient will pick up moth medications at the same time. We discussed diarrhea associated with metformin initiation and possible diarrhea due to [place]. Patient will manage diarrhea with loperamide and contact the office with persistant symptoms if they occur.  We will follow-up with the patient once Piqray Rx is at pharmacy and product is on order.  Patient knows to call the office with any questions or concerns.  Oral Oncology Clinic will continue to follow.  Johny Drilling, PharmD, BCPS, BCOP  01/28/2018 2:00 PM Oral Oncology Clinic [phone]

## 2018-01-29 ENCOUNTER — Other Ambulatory Visit: Payer: Self-pay | Admitting: Pharmacist

## 2018-01-29 DIAGNOSIS — C50511 Malignant neoplasm of lower-outer quadrant of right female breast: Secondary | ICD-10-CM

## 2018-01-29 DIAGNOSIS — Z170 Estrogen receptor positive status [ER+]: Principal | ICD-10-CM

## 2018-01-29 DIAGNOSIS — R739 Hyperglycemia, unspecified: Secondary | ICD-10-CM

## 2018-01-29 MED ORDER — METFORMIN HCL 500 MG PO TABS: 500.0000 mg | ORAL_TABLET | Freq: Every day | ORAL | 2 refills | Status: DC

## 2018-01-29 MED ORDER — ALPELISIB (300 MG DAILY DOSE) 2 X 150 MG PO TBPK: 300.0000 mg | ORAL_TABLET | Freq: Every day | ORAL | 1 refills | Status: DC

## 2018-01-29 MED FILL — PIQRAY 300MG DAILY DOSE 2X1: 2 X 150 | 28 days supply | Qty: 56 | Fill #0

## 2018-01-29 MED FILL — metFORMIN HCL 500 MG TABS: 500 | 30 days supply | Qty: 30 | Fill #0

## 2018-01-29 NOTE — Progress Notes (Signed)
Oral Oncology Pharmacist Encounter  New prescription for Piqray 150mg  capsules, take 2 capsules (300mg ) by mouth once daily with food e-scrbed to the [facility]. Prescription for metformin 500mg  tablets, 1 tablets (500mg ) by mouth once daily also e-scribed.  We are currently working with copayment assistance fund (TAF) to have her current benefits cover [place] expenses. We will have to contact a [place] cost institution at noon today (9am PST) to have [place] added to her copay "card". We had previously worked with copay foundation to have [place] added to formulary of approved medications.  We will contact patient about pick-up time of above medications once the claim is fully processed. Dispensing pharmacy will order [place] from wholesaler for dispensing.  Oral Oncology Clinic will continue to follow.  Johny Drilling, PharmD, BCPS, BCOP  01/29/2018   9:28 AM Oral Oncology Clinic [phone]

## 2018-01-29 NOTE — Telephone Encounter (Signed)
Oral Oncology Pharmacist Encounter  Suzanne [name] has been ordered to the pharmacy through EMR. Metformin Rx also sent. Suzanne [name] is on order from wholesaler for delivery to pharmacy tomorrow (01/30/18) [place] and metformin will be available to pick-up after 2pm on 6/20.  I called and updated patient on above information. Piqray Rx processed at the [facility] and TAF picked up copayment.  OV appts for 02/20/18 conformed with patient Patient knows to call the office with any additional questions or concerns between now and then.  Oral Oncology Clinic will continue to follow.  Johny Drilling, PharmD, BCPS, BCOP  01/29/2018 1:11 PM Oral Oncology Clinic [phone]

## 2018-01-30 ENCOUNTER — Other Ambulatory Visit: Payer: Self-pay | Admitting: Hematology and Oncology

## 2018-01-30 ENCOUNTER — Other Ambulatory Visit: Payer: Self-pay | Admitting: Adult Health

## 2018-01-30 ENCOUNTER — Other Ambulatory Visit: Payer: Self-pay | Admitting: Cardiovascular Disease

## 2018-01-30 MED FILL — ALBUTEROL 0.083% INHAL SOLN: (2.5 MG/3ML | 7 days supply | Qty: 90 | Fill #1

## 2018-01-30 MED FILL — BISOPROLOL FUMARATE 5 MG TA: 5 | 30 days supply | Qty: 30 | Fill #0

## 2018-01-30 MED FILL — TRELEGY ELLIPTA 100-62.5-25: 100-62.5-25 | 30 days supply | Qty: 60 | Fill #2

## 2018-01-30 MED FILL — FLUTICASONE PROP 50 MCG SPR: 50 | 30 days supply | Qty: 16 | Fill #3

## 2018-01-30 MED FILL — CELECOXIB 200 MG CAPSULE: 200 | 30 days supply | Qty: 60 | Fill #0

## 2018-01-30 MED FILL — PROAIR HFA 90 MCG INHALER: 108 (90 BAS | 50 days supply | Qty: 9 | Fill #0

## 2018-01-30 MED FILL — PROMETHAZINE 25 MG TABLET: 25 | 5 days supply | Qty: 30 | Fill #1

## 2018-01-30 MED FILL — tiZANidine HCL 4 MG TABS: 4 | 30 days supply | Qty: 60 | Fill #1

## 2018-02-05 ENCOUNTER — Telehealth: Payer: Self-pay | Admitting: Pharmacist

## 2018-02-05 ENCOUNTER — Other Ambulatory Visit: Payer: Self-pay

## 2018-02-05 DIAGNOSIS — N390 Urinary tract infection, site not specified: Secondary | ICD-10-CM

## 2018-02-05 DIAGNOSIS — T50905A Adverse effect of unspecified drugs, medicaments and biological substances, initial encounter: Secondary | ICD-10-CM

## 2018-02-05 DIAGNOSIS — R739 Hyperglycemia, unspecified: Secondary | ICD-10-CM

## 2018-02-05 MED ORDER — CIPROFLOXACIN HCL 500 MG PO TABS
500.0000 mg | ORAL_TABLET | Freq: Two times a day (BID) | ORAL | 0 refills | Status: DC
Start: 2018-02-05 — End: 2018-03-13

## 2018-02-05 MED ORDER — BLOOD GLUCOSE MONITOR KIT
PACK | 0 refills | Status: AC
Start: 2018-02-05 — End: ?

## 2018-02-05 MED ORDER — METFORMIN HCL 500 MG PO TABS
500.0000 mg | ORAL_TABLET | Freq: Two times a day (BID) | ORAL | 1 refills | Status: DC
Start: 2018-02-05 — End: 2018-02-10

## 2018-02-05 MED FILL — CIPROFLOXACIN HCL 500 MG TA: 500 | 7 days supply | Qty: 14 | Fill #0

## 2018-02-05 MED FILL — ACCU-CHEK AVIVA PLUS METER: W/DEVICE | 1 days supply | Qty: 1 | Fill #0

## 2018-02-05 MED FILL — metFORMIN HCL 500 MG TABS: 500 | 30 days supply | Qty: 60 | Fill #0

## 2018-02-05 MED FILL — ACCU-CHEK FASTCLIX LANCETS: 25 days supply | Qty: 102 | Fill #0

## 2018-02-05 MED FILL — ACCU-CHEK AVIVA PLUS TEST S: 25 days supply | Qty: 100 | Fill #0

## 2018-02-05 NOTE — Telephone Encounter (Signed)
Oral Oncology Pharmacist Encounter  Received call from patient with complaints of high sugars after starting new medication Piqray (alpelisib). Piqray start date: 02/01/18 Patient states blood sugars have been steadily increasing since Piqray initiation. Blood sugars were 334 and 305 yesterday.  Patient states she has not taken her [place] for today. Patient states she initiated metformin 500 mg once daily on 02/01/2018 as well.  Patient instructed to interrupt Piqray at this time and to not restart her medication until her blood sugars come back to baseline. Blood sugars were in the mid 100s prior to initiation of Piqray.  Will discuss with MD if metformin dose should be increased (500mg  BID), if Piqray dose should be decreased, or if both should occur.  Patient states she is willing to take multiple antidiabetic agents in order to get blood sugars under control and to continue on current medication therapy.  Patient states she is not experiencing any dermatologic or GI toxicity at this time. She does note slight burning sensation infrequently during urination since Piqray initiation. This will be monitored.  Someone from the office will follow up with patient about plan for Piqray reinitiation once blood sugars returned to baseline.  Patient also requests MD provide prescription for glucometer and test strips as she is using her husband's to monitor her blood sugars at this time.  Johny Drilling, PharmD, BCPS, BCOP  02/05/2018 9:28 AM Oral Oncology Clinic [phone]

## 2018-02-05 NOTE — Progress Notes (Signed)
Received VM from patient and she also spoke with Emeline Darling [facility] in regards to she is taking a new medication, Piqray and her blood sugars have been elevated 'over 300'.  She is also having burning with urination and pressure.  She would also like glucometer and supplies and she has been using her husband's. Per Evelena Peat notes, I will discuss with Dr Lindi Adie if Metformin 500mg  should be increased to twice daily.  Notified Dr Lindi Adie, he approved prescriptions for: -Metformin 500 mg twice daily.  -Cipro 500 mg twice a day for 7 days.  -Glucometer and test strips/supplies.   Outgoing call to patient and made her aware that the prescriptions have been sent to [facility]. Instructed to drink plenty of water and monitor UTI symptoms, if any changes ie fever- please contact our office.  Instructed pt to touch base our office tomorrow afternoon in regards to blood sugar readings or call before if needed. No other needs per pt at this time.

## 2018-02-06 ENCOUNTER — Telehealth: Payer: Self-pay

## 2018-02-06 ENCOUNTER — Other Ambulatory Visit: Payer: Self-pay | Admitting: Adult Health

## 2018-02-06 NOTE — Progress Notes (Signed)
Contacted patient via phone call after receiving communication from Dr. Lindi Adie regarding elevated blood glucose of 300-400 after initiation of [place] for metastatic cancer. The patient reports she is unable to come in to be seen today or tomorrow, but would be able to make it in on Monday. She reports she would also be unable to make it to pharmacy to pick up a new medication prior to that time.   She has been initiated on metformin 500 mg, currently taking BID. She has been checking blood sugars throughout the day, last several values have ranged 340-460. Discussed she should increase metformin to TID x 3 days then add 1 tab to largest meal for 4 tabs (2000 mg) daily if tolerating without GI SE. Will discuss changing to ER formulation for any GI concerns. She reports her husband is an insulin-dependant diabetic and she has access to lantus and humalog. Will have her start on humalog with sliding scale and frequent glucose checks over the weekend, keep a glucose log and present to OV on Monday at 3pm to discuss further. She may contact me over the weekend via her husband's MyChart account with any questions or concerns in the interim.

## 2018-02-06 NOTE — Telephone Encounter (Addendum)
Pt called to report about her BS last night and this morning. Pt states that her blood sugar last night was 457 and this morning 350 (fasting). Pt had an oatmeal for breaskfast. She is feeling weak and  lethargic today, possibly due to her hyperglycemia. Pt increased her metformin to 500mg  PO BID. She had her last dose of Piqray yesterday.   Discussed with Dr.Gudena and advised that her PCP be contacted today to further discuss pt cause of hyperglycemia/ management with Dr. Melford Aase.  Dr. Lindi Adie spoke with pt pcp and agreed to see pt today at 1:30pm. Notified pt and is aware. Will forward this message to Jessica,PharmD as well.

## 2018-02-09 DIAGNOSIS — R739 Hyperglycemia, unspecified: Secondary | ICD-10-CM | POA: Insufficient documentation

## 2018-02-09 NOTE — Progress Notes (Signed)
Assessment and Plan:  Suzanne [name] was seen today for follow-up.  Diagnoses and all orders for this visit:  Drug induced hyperglycemia/ insulin resistance Secondary to [facility] for metastatic cancer - monitored by Dr. Lindi Adie Continue calling twice daily to discuss insulin doses; anticipate 40 units 70/30 this evening, 20 units lantus at HS Once serum glucose has stabilized will increase lantus dose and decrease daytime 70/30 doses Will check some labs due to high resistance to insulin after discussion with Dr. Melford Aase -     COMPLETE METABOLIC PANEL WITH GFR -     Hemoglobin A1c -     Urinalysis w microscopic + reflex cultur -     Glutamic acid decarboxylase auto abs -     insulin glargine (LANTUS) 100 UNIT/ML injection; Inject up to 50 units once daily as directed. -     insulin NPH-regular Human (NOVOLIN 70/30) (70-30) 100 UNIT/ML injection; Inject twice daily as directed for elevated blood sugars. -     Insulin Syringes, Disposable, U-100 1 ML MISC; PT AWARE ON HOW TO TAKE INSULIN. E11.9 -     Lancets (ACCU-CHEK SOFT TOUCH) lancets; TEST SUGARS TWICE DAILY. E11.9  Follow up in 1 week with Dr. Melford Aase  Further disposition pending results of labs. Discussed med's effects and SE's.   Over 15 minutes of exam, counseling, chart review, and critical decision making was performed.   Future Appointments  Date Time Provider [place]  02/17/2018  2:30 PM Unk Pinto, MD [facility] None  02/20/2018  2:15 PM [facility] LAB 3 [facility] None  02/20/2018  2:45 PM Nicholas Lose, MD [facility] None  02/20/2018  3:15 PM [facility] [place] None  06/30/2018  3:45 PM Unk Pinto, MD [facility] None  12/30/2018  3:45 PM Liane Comber, NP [facility] None    ------------------------------------------------------------------------------------------------------------------   HPI 62 y.o.female She has breast cancer of right breast s/p mastectomy in 1994 with recurrence in 2007 with  lumpectomy; she has been treated by chemotherapy and radiation but was found to have axillary, perihilar, 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 Nicholas Lose. Genetic testing was negative. She is followed by [facility] pulmonology for ?lung mets, COPD/asthma with chronic hypoxemic respiratory failure on  O2 2-4 L. Sats 95% on 2 L today.   We contacted the patient patient via phone call on 6/27 after receiving communication from Dr. Lindi Adie regarding elevated blood glucose of 300-400 after initiation of [place] for metastatic cancer. She has been initiated on metformin 500 mg, currently taking 4 tabs daily. Insulin was also initiated, has been taking novolin 70/30 as directed via Dr. Melford Aase via phone calls 2-3 times daily, also taking lantus 20 units in the evening.   Presented log demonstrates:   6/29 - AM fasting - 357 - took 70/30 - 15 units           PM - after dinner - 482 - 70/30- 30 units           HS  - 486 - lantus 20 units  6/30 - AM fasting - 331 - 70/30 45 units           PM - 410 - 70/30 - 50 units           HS - 426 - lantus 20 units, 70/30 20 units  7/1- AM fasting - 253 - 70/30 - 60 units        Checked sugar prior to lung - 216  Past Medical History:  Diagnosis Date  . Alcoholism ([place])   . Cancer [facility])    breast ca - 1994; recurred in 2007. s/p masectomy with flap rconstruction aand chemo '94. local recurrence on chest wall. chemo and XRT '07. now femara  . Depression   . Dyspnea    myoview 2011: EF 55% question of  mild reverible anterior defect. thought to be breast attenuation. echo 45-50% with global HK. Grade 1 diastolyic dysfunction. RV nml. cardiac MRI with EF 44% with septal HK. no scar   . History of coronary artery stent placement   . Hyperlipidemia   . Hypertension   . Insomnia   . Lung disorder      Allergies  Allergen Reactions  . Ace Inhibitors     cough    Current  Outpatient Medications on File Prior to Visit  Medication Sig  . albuterol (PROAIR HFA) 108 (90 Base) MCG/ACT inhaler INHALE 1 PUFF BY MOUTH EVERY 6 HOURS AS NEEDED FOR WHEEZING OR SHORTNESS OF BREATH  . albuterol (PROVENTIL) (2.5 MG/3ML) 0.083% nebulizer solution INHALE 1 VIAL VIA NEBULIZER EVERY 6 HOURS AS NEEDED FOR WHEEZING OR SHORTNESS OF BREATH  . Alpelisib 300 MG Daily Dose (PIQRAY 300MG DAILY DOSE) 2x150 MG TBPK Take 300 mg by mouth daily. Take with food at approximately the same time each day.  . bisoprolol (ZEBETA) 5 MG tablet TAKE 1 TABLET BY MOUTH ONCE DAILY  . blood glucose meter kit and supplies KIT Dispense based on patient and insurance preference. Use up to four times daily as directed. (FOR ICD-9 250.00, 250.01).  . calcium carbonate (OS-CAL - DOSED IN MG OF ELEMENTAL CALCIUM) 1250 (500 Ca) MG tablet Take 1 tablet by mouth.  . celecoxib (CELEBREX) 200 MG capsule TAKE 1 CAPSULE (200 MG TOTAL) BY MOUTH 2 (TWO) TIMES DAILY AS NEEDED.  . cholecalciferol (VITAMIN D) 1000 units tablet Take 1,000 Units by mouth daily.  . ciprofloxacin (CIPRO) 500 MG tablet Take 1 tablet (500 mg total) by mouth 2 (two) times daily.  . fluticasone (FLONASE) 50 MCG/ACT nasal spray Place 2 sprays into both nostrils daily.  . Fluticasone-Umeclidin-Vilant (TRELEGY ELLIPTA) 100-62.5-25 MCG/INH AEPB Inhale 1 puff into the lungs daily.  Marland Kitchen HYDROmorphone (DILAUDID) 2 MG tablet Take 1 tablet (2 mg total) by mouth every 4 (four) hours as needed for severe pain.  Marland Kitchen ibuprofen (ADVIL,MOTRIN) 200 MG tablet Take 200 mg by mouth every 6 (six) hours as needed.  Marland Kitchen letrozole (FEMARA) 2.5 MG tablet TAKE 1 TABLET BY MOUTH ONCE DAILY  . loratadine (CLARITIN) 10 MG tablet Take 1 tablet (10 mg total) by mouth daily.  Marland Kitchen LORazepam (ATIVAN) 2 MG tablet TAKE 1/2 TO 1 TABLET BY MOUTH EACH NIGHT AT BEDTIME  . prochlorperazine (COMPAZINE) 10 MG tablet Take 1 tablet (10 mg total) by mouth every 6 (six) hours as needed for nausea or  vomiting.  . promethazine (PHENERGAN) 25 MG tablet Take 1 tablet (25 mg total) by mouth every 6 (six) hours as needed for nausea.  Marland Kitchen tiZANidine (ZANAFLEX) 4 MG tablet Take 1 tablet (4 mg total) by mouth 2 (two) times daily as needed for muscle spasms.   No current facility-administered medications on file prior to visit.     ROS: all negative except above.   Physical Exam:  BP 122/66   Pulse 76   Temp 98.9 F (37.2 C)   Resp 16   Ht '5\' 5"'  (1.651 m)   Wt 178 lb (80.7 kg)  SpO2 95%   BMI 29.62 kg/m   General Appearance: Well nourished, in no apparent distress. Eyes: PERRLA, EOMs ENT/Mouth: No erythema, swelling, or exudate on post pharynx.  Tonsils not swollen or erythematous. Hearing normal.  Neck: Supple.  Respiratory: Respiratory effort normal, BS equal bilaterally without rales, rhonchi, wheezing or stridor. On 2 L O2 by [place].  Cardio: RRR with no MRGs. Brisk peripheral pulses without edema.  Abdomen: Soft, + BS.  Non tender, no guarding, rebound, hernias, masses. Lymphatics: Non tender without lymphadenopathy.  Musculoskeletal: Symmetrical strength, slow gait.  Skin: Warm, dry without rashes, lesions, ecchymosis.  Neuro: Cranial nerves intact. Normal muscle tone, no cerebellar symptoms. Sensation intact.  Psych: Awake and oriented X 3, slow mentation, normal affect, Insight and Judgment appropriate.    Suzanne Ribas, NP 4:25 PM [facility]

## 2018-02-10 ENCOUNTER — Encounter: Payer: Self-pay | Admitting: Adult Health

## 2018-02-10 ENCOUNTER — Ambulatory Visit: Payer: Medicare Other | Admitting: Adult Health

## 2018-02-10 ENCOUNTER — Other Ambulatory Visit: Payer: Self-pay | Admitting: Internal Medicine

## 2018-02-10 ENCOUNTER — Other Ambulatory Visit: Payer: Self-pay

## 2018-02-10 DIAGNOSIS — E109 Type 1 diabetes mellitus without complications: Secondary | ICD-10-CM

## 2018-02-10 DIAGNOSIS — T50905A Adverse effect of unspecified drugs, medicaments and biological substances, initial encounter: Principal | ICD-10-CM

## 2018-02-10 DIAGNOSIS — R739 Hyperglycemia, unspecified: Secondary | ICD-10-CM

## 2018-02-10 MED ORDER — ACCU-CHEK SOFT TOUCH LANCETS MISC: 1 refills | Status: DC

## 2018-02-10 MED ORDER — INSULIN GLARGINE 100 UNIT/ML ~~LOC~~ SOLN: SUBCUTANEOUS | 11 refills | Status: DC

## 2018-02-10 MED ORDER — INSULIN NPH ISOPHANE & REGULAR (70-30) 100 UNIT/ML ~~LOC~~ SUSP: SUBCUTANEOUS | 3 refills | Status: DC

## 2018-02-10 MED ORDER — METFORMIN HCL ER 500 MG PO TB24: ORAL_TABLET | ORAL | 3 refills | Status: DC

## 2018-02-10 MED ORDER — INSULIN NPH ISOPHANE & REGULAR (70-30) 100 UNIT/ML ~~LOC~~ SUSP: SUBCUTANEOUS | 4 refills | Status: DC

## 2018-02-10 MED ORDER — METFORMIN HCL 500 MG PO TABS: ORAL_TABLET | ORAL | 1 refills | Status: DC

## 2018-02-10 MED ORDER — INSULIN SYRINGES (DISPOSABLE) U-100 1 ML MISC: 0 refills | Status: DC

## 2018-02-10 MED FILL — TRUEPLUS SYR 1ML 30GX5/16: 30G X 5/16" | 90 days supply | Qty: 100 | Fill #0

## 2018-02-10 MED FILL — TRUEPLUS SYR 1ML 30GX5/16": 30G X 5/16" | 90 days supply | Qty: 100 | Fill #0

## 2018-02-10 MED FILL — LANTUS 100 UNITS/ML VIAL: 100 | 20 days supply | Qty: 10 | Fill #0

## 2018-02-10 MED FILL — PROMETHAZINE 25 MG TABLET: 25 | 5 days supply | Qty: 30 | Fill #2

## 2018-02-10 NOTE — Progress Notes (Signed)
[place] [facility]   Unk Pinto, M.D.    Uvaldo Bristle. Silverio Lay, P.A.-C      Liane Comber, [address]                [place], [place] [address] Telephone [phone] Telefax [phone] ________________________________  Per telephone call (11:00 pm)  Last night  (02/09/2018)  -  Glu    426 mg%  @ 11pm ->  Lantus 20 u & Nov 70/30 20 u   This Am      (02/10/2018)  -   FBG  253 mg%  -> Nov 70/30   60 u   6:30 pm     (02/10/2018)  -   Glu     353 mg%  -> Nov 70/30   60 u  11:00 pm    (02/10/2018)   -   Glu     403 mg%  -> Lantus 50 u   - Patient c/o N & bloating & anorexia - attributed to the Immediate Release Metformin  - New Rx's sent to [facility] for Metformin 500 ER 2 tabs bid and for Novolin 70/30   - will continue 3 x/day SS titration   - labs from afternoon visit still pending

## 2018-02-11 ENCOUNTER — Other Ambulatory Visit: Payer: Self-pay | Admitting: Internal Medicine

## 2018-02-11 MED FILL — METFORMIN HCL ER 500 MG TAB: 500 | 90 days supply | Qty: 360 | Fill #0

## 2018-02-11 MED FILL — BD INSULIN SYR 1 ML 6MMX31G: 31G X 15/64 | 50 days supply | Qty: 100 | Fill #0

## 2018-02-11 MED FILL — HUMULIN 70/30 VIAL: (70-30) 100 | 40 days supply | Qty: 60 | Fill #0

## 2018-02-11 NOTE — Progress Notes (Signed)
7:30  am     (02/08/2018)  -  FBG  357 mg%         ->                               Nov 70/30      15 u    Total    6:30  pm    (02/08/2018)  -   CBG   482 mg%       ->                               Nov 70/30      30 u  11:00 pm    (02/08/2018)  -   CBG   486 mg%       ->   Lantus 20 u                                               _____  65 u  +++++++++++++++++++++++++++++++++++++++++++++++++++++++++++++ 8:30  am     (02/09/2018)   -  FBG    331 mg%        ->                             Nov 70/30    45 u   6:30  pm     (02/09/2018)    - CBG   410 mg%         ->                        Nov 70/30     50 u    11:00 pm    (02/09/2018)  -   CBG    426 mg%       ->  Lantus 20 u +        Nov 70/30     20 u          _____  135 u  +++++++++++++++++++++++++++++++++++++++++++++++++++++++++++++ 7:30  am      (02/10/2018)    -   FBG    253 mg%       ->                            Nov 70/30      60 u   6:30  pm     (02/10/2018)    -   CBG      353 mg%      ->                           Nov 70/30      60 u  11:00 pm    (02/10/2018)     -   CBG     403 mg%       ->   Lantus               50 u                               _____  170 u  +++++++++++++++++++++++++++++++++++++++++++++++++++++++++++++ 9:00  am       (02/11/2018)   -    FBG      236 mg%      ->                            Nov 70/30          20  u  6:30  pm       (02/11/2018)  -     CBG     329 mg%      ->                            Nov  70/30         20  u   11:00 pm     (02/11/2018)  -      CBG      428 mg%     ->     Lantus              60 u                          ______   100 u  +++++++++++++++++++++++++++++++++++++++++++++++++++++++++++++    - Patient reports feeling better tonight, eating yogurt and taking liquids  After off immediate release Metformin x 24-36 hours

## 2018-02-11 NOTE — Progress Notes (Signed)
Addendum 11 :15 pm  7:30  am     (02/08/2018)  -  FBG  357 mg%         ->                               Nov 70/30      15 u     Total               6:30  pm    (02/08/2018)  -   CBG   482 mg%       ->                               Nov 70/30      30 u  11:00 pm    (02/08/2018)  -   CBG   486 mg%       ->   Lantus 20 u                                               _____  65 u  +++++++++++++++++++++++++++++++++++++++++++++++++++++++++++++ 8:30  am     (02/09/2018)   -  FBG    331 mg%        ->                             Nov 70/30    45 u   6:30  pm     (02/09/2018)    - CBG   410 mg%         ->                        Nov 70/30     50 u                          11:00 pm    (02/09/2018) -  CBG 426 mg%      ->Lantus 20 u +       Nov 70/30     20 u          _____  135 u  +++++++++++++++++++++++++++++++++++++++++++++++++++++++++++++ 7:30  am (02/10/2018)   - FBG    253 mg%      ->                            Nov 70/30    60 u  6:30  pm (02/10/2018)   - CBG  353 mg%     ->                          Nov 70/30    60 u  11:00 pm (02/10/2018)   - CBG 403 mg%      ->  Lantus               50 u                               _____  170 u  +++++++++++++++++++++++++++++++++++++++++++++++++++++++++++++ 9:00  am       (  02/11/2018)   -    FBG      236 mg%      ->                            Nov 70/30          20  u  6:30  pm       (02/11/2018)  -     CBG     329 mg%      ->                            Nov  70/30         20  u   11:00 pm     (02/11/2018)  -      CBG      428 mg%     ->     Lantus              60 u                          ______   100 u  +++++++++++++++++++++++++++++++++++++++++++++++++++++++++++++    - Patient reports feeling better tonight, eating yogurt and taking liquids after off immediate release Metformin x 24-36 hours ++++++++++++++++++++++++++++++++++++++++++++++++  Addendum -   Patient called back at 11:15 - relating that she was distracted  by husband and actually administered Nov 70/30 - 60 units instead of the recommended Lantus 60 u     Patient reassured and cautioned to be sure to take a high protein snack as peanut butter sandwich & yogurt before sleep and discussed the possible sx's of nocturnal hypoglycemic reactions and likewise advise her husband of same.

## 2018-02-12 ENCOUNTER — Other Ambulatory Visit: Payer: Self-pay | Admitting: Internal Medicine

## 2018-02-12 NOTE — Progress Notes (Signed)
  7:30 am (02/08/2018) - FBG   357 mg% ->Nov 70/30 15 u               Total  6:30 pm (02/08/2018)   - CBG  482 mg% -> Nov 70/30 30 u 11:00 pm (02/08/2018) - CBG 486 mg% ->Lantus 20 u _____ 65 u ++++++++++++++++++++++++++++++++++++++++++++++++++++++++++++++++ 8:30 am (02/09/2018) - FBG 331 mg% ->Nov 70/30  45 u  6:30 pm (02/09/2018) -  CBG  410 mg% ->      Nov 70/30 50 u 11:00pm (02/09/2018)  -CBG426 mg% ->Lantus 20 u+Nov 70/30 20 u _____ 135 u  ++++++++++++++++++++++++++++++++++++++++++++++++++++++++++++++++ 7:30 am (02/10/2018) - FBG   253 mg% ->Nov 70/30 60 u 6:30 pm (02/10/2018) -CBG353 mg% ->  Nov 70/30 60 u 11:00 pm (02/10/2018) -CBG403 mg% ->Lantus 50 u           _____ 170 u  ++++++++++++++++++++++++++++++++++++++++++++++++++++++++++++++++ 9:00 am (02/11/2018)  - FBG 236mg % ->Nov 70/30 20u 6:30 pm (02/11/2018)    -  CBG 329mg % ->Nov 70/30 20u  11:00 pm  (02/11/2018)   -  CBG   428 mg%   ->->>Nov 70/30       60 u _____  100 u  ++++++++++++++++++++++++++++++++++++++++++++++++++++++++++++++++  9:30   am      (02/12/2018)   -     FBG      332 mg%      ->  Lantus 20 u  +  Nov 70/30        60 u  4:00   pm      (02/12/2018)   -     CBG     354 mg%       ->  7:00   pm      (02/12/2018)   -     CBG      254 mg%      ->                           Nov 70/30        20 u      11:00 pm      (02/12/2018)   -      CBG                            ->  Lantus  60 u                                        ______  140 u ++++++++++++++++++++++++++++++++++++++++++++++++++++++++++++++++

## 2018-02-13 LAB — URINALYSIS W MICROSCOPIC + REFLEX CULTURE
AMORPHOUS SEDIMENT: NONE SEEN /HPF
BACTERIA UA: NONE SEEN /HPF
Bilirubin Urine: NEGATIVE
HYALINE CAST: NONE SEEN /LPF
Hgb urine dipstick: NEGATIVE
Leukocyte Esterase: NEGATIVE
NITRITES URINE, INITIAL: NEGATIVE
RBC / HPF: NONE SEEN /HPF (ref 0–2)
SPECIFIC GRAVITY, URINE: 1.024 (ref 1.001–1.03)
pH: 5.5 (ref 5.0–8.0)

## 2018-02-13 LAB — COMPLETE METABOLIC PANEL WITH GFR
AG RATIO: 1.2 (calc) (ref 1.0–2.5)
ALBUMIN MSPROF: 4.1 g/dL (ref 3.6–5.1)
ALKALINE PHOSPHATASE (APISO): 99 U/L (ref 33–130)
ALT: 99 U/L — ABNORMAL HIGH (ref 6–29)
AST: 145 U/L — AB (ref 10–35)
BILIRUBIN TOTAL: 1.7 mg/dL — AB (ref 0.2–1.2)
BUN / CREAT RATIO: 14 (calc) (ref 6–22)
BUN: 18 mg/dL (ref 7–25)
CO2: 28 mmol/L (ref 20–32)
Calcium: 9.3 mg/dL (ref 8.6–10.4)
Chloride: 89 mmol/L — ABNORMAL LOW (ref 98–110)
Creat: 1.31 mg/dL — ABNORMAL HIGH (ref 0.50–0.99)
GFR, Est African American: 51 mL/min/{1.73_m2} — ABNORMAL LOW (ref >=60)
GFR, Est Non African American: 44 mL/min/{1.73_m2} — ABNORMAL LOW (ref >=60)
GLOBULIN: 3.4 g/dL (ref 1.9–3.7)
Glucose, Bld: 291 mg/dL — ABNORMAL HIGH (ref 65–99)
POTASSIUM: 3.7 mmol/L (ref 3.5–5.3)
Sodium: 132 mmol/L — ABNORMAL LOW (ref 135–146)
Total Protein: 7.5 g/dL (ref 6.1–8.1)

## 2018-02-13 LAB — HEMOGLOBIN A1C
EAG (MMOL/L): 7.1 (calc)
HEMOGLOBIN A1C: 6.1 %{Hb} — AB (ref <5.7)
Mean Plasma Glucose: 128 (calc)

## 2018-02-13 LAB — GLUTAMIC ACID DECARBOXYLASE AUTO ABS

## 2018-02-13 LAB — NO CULTURE INDICATED

## 2018-02-14 ENCOUNTER — Other Ambulatory Visit: Payer: Self-pay | Admitting: Hematology and Oncology

## 2018-02-14 ENCOUNTER — Other Ambulatory Visit: Payer: Self-pay

## 2018-02-14 DIAGNOSIS — C7800 Secondary malignant neoplasm of unspecified lung: Secondary | ICD-10-CM

## 2018-02-14 DIAGNOSIS — C50511 Malignant neoplasm of lower-outer quadrant of right female breast: Secondary | ICD-10-CM

## 2018-02-14 DIAGNOSIS — C7951 Secondary malignant neoplasm of bone: Secondary | ICD-10-CM

## 2018-02-14 MED ORDER — HYDROMORPHONE HCL 2 MG PO TABS: 2.0000 mg | ORAL_TABLET | ORAL | 0 refills | Status: DC | PRN

## 2018-02-14 MED FILL — HYDROmorphone HCL 2 MG TABS: 2 | 10 days supply | Qty: 60 | Fill #0

## 2018-02-14 NOTE — Telephone Encounter (Signed)
Returned pt's call regarding refill on her Dilaudid.  Dr Lindi Adie has completed the prescription refill and it is ready for pick up at cancer center.  Pt voiced understanding and her husband is picking it up, no other needs per pt at this time.

## 2018-02-15 ENCOUNTER — Other Ambulatory Visit: Payer: Self-pay | Admitting: Internal Medicine

## 2018-02-15 NOTE — Progress Notes (Addendum)
(02/08/2018) - FBG   357 mg% ->  Nov 70/30 15 u               Total  6:30 pm (02/08/2018)   - CBG  482 mg% ->  Nov 70/30 30 u 11:00 pm (02/08/2018) - CBG 486 mg% ->Lantus 20 u _____ 65 u ++++++++++++++++++++++++++++++++++++++++++++++++++++++++++++++++ 8:30 am (02/09/2018) - FBG 331 mg% ->  Nov 70/30  45 u  6:30 pm (02/09/2018) -  CBG  410 mg% ->        Nov 70/30 50 u 11:00pm (02/09/2018)  -CBG426 mg% ->Lantus 20 u+  Nov 70/30 20 u _____ 135 u  ++++++++++++++++++++++++++++++++++++++++++++++++++++++++++++++++ 7:30 am (02/10/2018) - FBG   253 mg% ->   Nov 70/30 60 u 6:30 pm (02/10/2018) -CBG353 mg% ->    Nov 70/30 60 u 11:00 pm (02/10/2018) -CBG403 mg% ->Lantus 50 u           _____ 170 u  ++++++++++++++++++++++++++++++++++++++++++++++++++++++++++++++++ 9:00 am (02/11/2018)  - FBG 236mg % ->  Nov 70/30 20u 6:30 pm (02/11/2018)    -  CBG 329mg % ->  Nov 70/30 20u  11:00 pm  (02/11/2018)   -  CBG   428 mg%   ->->>  Nov 70/30       60 u _____  100 u  ++++++++++++++++++++++++++++++++++++++++++++++++++++++++++++++++  9:30   am      (02/12/2018)   -     FBG      332 mg%      ->  Lantus 20 u  +    Nov 70/30        60 u  4:00   pm      (02/12/2018)   -     CBG     354 mg%       ->  7:00   pm      (02/12/2018)   -     CBG      254 mg%      ->                             Nov 70/30        20 u      11:00 pm      (02/12/2018)   -      CBG      199  mg%      ->  Lantus  60 u                                        ______  160 u ++++++++++++++++++++++++++++++++++++++++++++++++++++++++++++++++ 9:00 am        (02/13/2018)     -    FBG      501 mg%       ->                             Nov 70/30      60  u 3:30 pm        (02/13/2018)     -    10:00 pm      (02/13/2018)     -   CBG      556  mg%       ->  Lantus  80 u   +  Nov 70/30  60  u   ______  200 u ++++++++++++++++++++++++++++++++++++++++++++++++++++++++++++++++ 8:30 am         (02/14/2018)    -  FBG        377 mg%       ->                              Nov 70/30     70  u 12:00                                 -  CBG           451  mg%                                        Nov 70/30     80  u 6:00 pm          (02/14/2018)   -  CBG        452  mg%     ->                               Nov 70/30     80  u 9:00 pm          (02/14/2018)   -  CBG        428  mg%       ->  Lantus 120 u                                      ______  350 u +++++++++++++++++++++++++++++++++++++++++++++++++++++++++++++++++   8:00  am           (02/15/2018)    -   FBG          288  mg%    ->                              Nov 70/30    80 u  3:00  pm           (02/15/2018)    -   CBG         436 mg%    ->                               Nov 70/30  100 u  7:00  pm           (02/15/2018)    -   CBG         443  mg%    ->                               Nov 70/30  100 u   10:30 pm          (02/15/2018)    -   CBG         401 mg%    -> Lantus 120 u  +   Nov 70/30  100  u  _____  500 u   ++++++++++++++++++++++++++++++++++++++++++++++++++++++++++++++++++  Patient advised to [place] continue to call 3-4 x/day to regulate Insulin dosing.   Patient advised that I will contact Dr Lindi Adie on Mon July 8th to discuss  Severe Insulin Resistance

## 2018-02-16 ENCOUNTER — Other Ambulatory Visit: Payer: Self-pay | Admitting: Internal Medicine

## 2018-02-16 DIAGNOSIS — J449 Chronic obstructive pulmonary disease, unspecified: Secondary | ICD-10-CM | POA: Diagnosis not present

## 2018-02-16 NOTE — Progress Notes (Signed)
This very nice 62 y.o. MWF presents for  management of severe Hyperglycemia consequent of a novel Drug - PiQray started 02/01/2018 for palliation of her metastatic disease. By 6/26 her glucoses were ranging between 300-400 and she had be started on Metformin. On 02/06/2018, she was initiated on Insulin and by 6/29 her CBG's were running up to almost 500 mg%. Over the next several days, glucose continued to remain very elevated in the 300-400+ range despite increasing Lantus and Novolin 70/30 SS dosing 3-4 x/day with insulin daily doses up to 350 units on 02/14/2018 and 500 units on 02/15/2018 with sugars continuing to rise remaining in the 400 mg%  range. Patient was advised to withhold PiQray beginning 02/16/2018 pending consultation & guidance until is reconciled w/ Dr Lindi Adie.      Patient's hx/o Rt Breast Cancer predates from a Rt Mastectomy 1994, followed by Chemotx and then Tamoxifen x 5 yrs. In 2007, she had a Chest wall recurrence again tx'd with Chemotx. In 10/2006 , she underwent Rt Breast lumpectomy followed by Radiation and then Femara. In 2017, her PET scan was (+) for pulmonary LN and liver lesions and Chemotx was resumed.      Hyperlipidemia is controlled with diet & meds. Patient denies myalgias or other med SE's. Last Lipids were at goal: Lab Results  Component Value Date   CHOL 141 12/16/2017   HDL 43 (L) 12/16/2017   LDLCALC 82 12/16/2017   TRIG 82 12/16/2017   CHOLHDL 3.3 12/16/2017      Also, the patient has history of PreDiabetes  with A1c 5.9% two years ago, then 5.2% two months ago and more recently 6/1% 1 week ago. and has had no symptoms of reactive hypoglycemia, diabetic polys, paresthesias or visual blurring.  Last A1c was  Lab Results  Component Value Date   HGBA1C 6.1 (H) 02/10/2018      Further, the patient also has history of Vitamin D Deficiency and does not supplement vitamin D. Last vitamin D was very low: Lab Results  Component Value Date   VD25OH 35 12/16/2017     Current Outpatient Medications on File Prior to Visit  Medication Sig  . albuterol (PROAIR HFA) 108 (90 Base) MCG/ACT inhaler INHALE 1 PUFF BY MOUTH EVERY 6 HOURS AS NEEDED FOR WHEEZING OR SHORTNESS OF BREATH  . albuterol (PROVENTIL) (2.5 MG/3ML) 0.083% nebulizer solution INHALE 1 VIAL VIA NEBULIZER EVERY 6 HOURS AS NEEDED FOR WHEEZING OR SHORTNESS OF BREATH  . bisoprolol (ZEBETA) 5 MG tablet TAKE 1 TABLET BY MOUTH ONCE DAILY  . blood glucose meter kit and supplies KIT Dispense based on patient and insurance preference. Use up to four times daily as directed. (FOR ICD-9 250.00, 250.01).  . calcium carbonate (OS-CAL - DOSED IN MG OF ELEMENTAL CALCIUM) 1250 (500 Ca) MG tablet Take 1 tablet by mouth.  . celecoxib (CELEBREX) 200 MG capsule TAKE 1 CAPSULE (200 MG TOTAL) BY MOUTH 2 (TWO) TIMES DAILY AS NEEDED.  . cholecalciferol (VITAMIN D) 1000 units tablet Take 1,000 Units by mouth daily.  . ciprofloxacin (CIPRO) 500 MG tablet Take 1 tablet (500 mg total) by mouth 2 (two) times daily.  . fluticasone (FLONASE) 50 MCG/ACT nasal spray Place 2 sprays into both nostrils daily.  . Fluticasone-Umeclidin-Vilant (TRELEGY ELLIPTA) 100-62.5-25 MCG/INH AEPB Inhale 1 puff into the lungs daily.  Marland Kitchen HYDROmorphone (DILAUDID) 2 MG tablet Take 1 tablet (2 mg total) by mouth every 4 (four) hours as needed for severe pain.  Marland Kitchen  ibuprofen (ADVIL,MOTRIN) 200 MG tablet Take 200 mg by mouth every 6 (six) hours as needed.  . insulin glargine (LANTUS) 100 UNIT/ML injection Inject up to 50 units once daily as directed.  . insulin NPH-regular Human (NOVOLIN 70/30) (70-30) 100 UNIT/ML injection Take 75 units 2 x/day or as directed  . Insulin Syringes, Disposable, U-100 1 ML MISC PT AWARE ON HOW TO TAKE INSULIN. E11.9  . Lancets (ACCU-CHEK SOFT TOUCH) lancets TEST SUGARS TWICE DAILY. E11.9  . letrozole (FEMARA) 2.5 MG tablet TAKE 1 TABLET BY MOUTH ONCE DAILY  . loratadine (CLARITIN) 10 MG tablet Take 1 tablet (10 mg total) by  mouth daily.  Marland Kitchen LORazepam (ATIVAN) 2 MG tablet TAKE 1/2 TO 1 TABLET BY MOUTH EACH NIGHT AT BEDTIME  . prochlorperazine (COMPAZINE) 10 MG tablet Take 1 tablet (10 mg total) by mouth every 6 (six) hours as needed for nausea or vomiting.  . promethazine (PHENERGAN) 25 MG tablet Take 1 tablet (25 mg total) by mouth every 6 (six) hours as needed for nausea.  Marland Kitchen tiZANidine (ZANAFLEX) 4 MG tablet Take 1 tablet (4 mg total) by mouth 2 (two) times daily as needed for muscle spasms.  . Alpelisib 300 MG Daily Dose (PIQRAY 300MG DAILY DOSE) 2x150 MG TBPK Take 300 mg by mouth daily. Take with food at approximately the same time each day. (Patient not taking: Reported on 02/17/2018)  . metFORMIN (GLUCOPHAGE-XR) 500 MG 24 hr tablet Take 2 tablets 2 x/ day for Diabetes (Patient not taking: Reported on 02/17/2018)   No current facility-administered medications on file prior to visit.    Allergies  Allergen Reactions  . Ace Inhibitors     cough   PMHx:   Past Medical History:  Diagnosis Date  . Alcoholism ([place])   . Cancer [facility])    breast ca - 1994; recurred in 2007. s/p masectomy with flap rconstruction aand chemo '94. local recurrence on chest wall. chemo and XRT '07. now femara  . Depression   . Dyspnea    myoview 2011: EF 55% question of  mild reverible anterior defect. thought to be breast attenuation. echo 45-50% with global HK. Grade 1 diastolyic dysfunction. RV nml. cardiac MRI with EF 44% with septal HK. no scar   . History of coronary artery stent placement   . Hyperlipidemia   . Hypertension   . Insomnia   . Lung disorder    Immunization History  Administered Date(s) Administered  . DTaP 05/24/2015  . Influenza Split 05/28/2013  . Influenza Whole 08/21/2007  . Influenza,inj,Quad PF,6+ Mos 05/13/2014, 05/13/2017  . Influenza-Unspecified 05/24/2015  . Pneumococcal Conjugate-13 08/23/2014  . Pneumococcal Polysaccharide-23 08/21/2007  . Td 05/14/2012  . Zoster 05/16/2011   Past Surgical  History:  Procedure Laterality Date  . CARDIAC CATHETERIZATION     [facility]  . MASTECTOMY    . ORIF DISTAL RADIUS FRACTURE    . PORT-A-CATH REMOVAL  07/29/2012   Procedure: REMOVAL PORT-A-CATH;  Surgeon: Haywood Lasso, MD;  Location: [facility];  Service: General;  Laterality: Left;  . R masectomy  '94   . R transflap  '94   FHx:    Reviewed / unchanged  SHx:    Reviewed / unchanged   Systems Review:  Constitutional: Denies fever, chills, wt changes, headaches, insomnia, fatigue, night sweats, change in appetite. Eyes: Denies redness, blurred vision, diplopia, discharge, itchy, watery eyes.  ENT: Denies discharge, congestion, post nasal drip, epistaxis, sore throat, earache, hearing loss, dental pain, tinnitus, vertigo,  sinus pain, snoring.  CV: Denies chest pain, palpitations, irregular heartbeat, syncope, dyspnea, diaphoresis, orthopnea, PND, claudication or edema. Respiratory: denies cough, dyspnea,  pleurisy, hoarseness, laryngitis, wheezing.  Gastrointestinal: Denies dysphagia, odynophagia, heartburn, reflux, water brash, abdominal pain or cramps, nausea, vomiting, bloating, diarrhea, constipation, hematemesis, melena, hematochezia  or hemorrhoids. Genitourinary: Denies dysuria, frequency, urgency, nocturia, hesitancy, discharge, hematuria or flank pain. Musculoskeletal: Has myalgias, stiffness in neck.  Skin: Denies pruritus, rash, hives, warts, acne, eczema or change in skin lesion(s). Neuro: No weakness, tremor, incoordination, spasms, or paresthesia. Psychiatric: Denies confusion, memory loss or sensory loss. Endo: Denies change in weight, skin or hair change.  Heme/Lymph: No excessive bleeding, bruising or enlarged lymph nodes.  Physical Exam  BP 106/76   P 100   T 97.4 F    R 16   Ht '5\' 5"'    Wt 171 lb 9.6 oz   BMI 28.56   O2 sat 95% on 2 lit O2  Appears  over nourished, well groomed  and in no distress.  Eyes: PERRLA, EOMs, conjunctiva  no swelling or erythema. Sinuses: No frontal/maxillary tenderness ENT/Mouth: EAC's clear, TM's nl w/o erythema, bulging. Nares clear w/o erythema, swelling, exudates. Oropharynx clear without erythema or exudates. Oral hygiene is good. Tongue normal, non obstructing. Hearing intact.  Neck: Supple. Thyroid not palpable. Car 2+/2+ without bruits, nodes or JVD. Chest: Respirations nl with BS clear & equal w/o rales, rhonchi, wheezing or stridor.  Cor: Heart sounds normal w/ regular rate and rhythm without sig. murmurs, gallops, clicks or rubs. Peripheral pulses normal and equal  without edema.  Abdomen: Soft & bowel sounds normal. Non-tender w/o guarding, rebound, hernias, masses or organomegaly.  Lymphatics: Unremarkable.  Musculoskeletal: Full ROM all peripheral extremities, joint stability, 5/5 strength and normal gait.  Skin: Warm, dry without exposed rashes, lesions or ecchymosis apparent.  Neuro: Cranial nerves intact, reflexes equal bilaterally. Sensory-motor testing grossly intact. Tendon reflexes grossly intact.  Pysch: Alert & oriented x 3.  Insight and judgement nl & appropriate. No ideations.  Assessment and Plan:  1. Hyperglycemia, drug-induced  - COMPLETE METABOLIC PANEL WITH GFR - continue 3-4 x/day CBG monitoring and SS insulin coverage  2. Insulin resistance  - COMPLETE METABOLIC PANEL WITH GFR  3. Metastatic breast carcinoma ([place])  4. Medication management  - CBC with Differential/Platelet - COMPLETE METABOLIC PANEL WITH GFR - Magnesium       As discussed w/Dr Lindi Adie - continue to hold [place] to stabilize glycemic status and when stable, re-assess & retry [place] at a lower dosage.        Recommended labs to assess and monitor clinical status with further disposition pending results of labs. Over 40 minutes of exam, counseling, chart review was performed.

## 2018-02-16 NOTE — Progress Notes (Addendum)
(02/08/2018) - FBG  357 mg% ->  Nov 70/30 15 u  Total  6:30 pm (02/08/2018) - CBG  482 mg% ->  Nov 70/30 30 u 11:00 pm (02/08/2018) - CBG 486 mg% ->Lantus 20 u ____65 u +++++++++++++++++++++++++++++++++++++++++++++++++++++++++++++++++ 8:30 am (02/09/2018) - FBG 331 mg% ->  Nov 70/30  45 u  6:30 pm (02/09/2018) - CBG 410 mg% ->   Nov 70/30 50 u 11:00pm (02/09/2018) -CBG426 mg% ->Lantus 20 u+  Nov 70/30 20 u ___ 135 u  ++++++++++++++++++++++++++++++++++++++++++++++++++++++++++++++++++ 9:00 am (02/11/2018)  - FBG 236mg % ->  Nov 70/30 20u 6:30 pm (02/11/2018) -  CBG 329mg % ->  Nov 70/30 20u  11:00 pm  (02/11/2018)  -  CBG 428 mg%  ->->>  Nov 70/30 60 u_____  100 u  +++++++++++++++++++++++++++++++++++++++++++++++++++++++++++++++++++ 9:30 am (02/12/2018) - FBG 332 mg% ->Lantus 20 u +   Nov 70/30 60 u  4:00 pm (02/12/2018) - CBG 354 mg% -> 7:00 pm (02/12/2018) - CBG 254 mg% ->  Nov 70/30 20 u  11:00 pm (02/12/2018) - CBG  199 mg% ->Lantus 60 u ____ 160 u +++++++++++++++++++++++++++++++++++++++++++++++++++++++++++++++++++ 9:00 am        (02/13/2018)     -    FBG      501 mg%       ->                             Nov 70/30      60  u 3:30 pm        (02/13/2018)     -    10:00 pm      (02/13/2018)     -   CBG      556  mg%       ->  Lantus  80 u   +  Nov 70/30     60  u    ____ 200 u +++++++++++++++++++++++++++++++++++++++++++++++++++++++++++++++++++ 8:30 am          (02/14/2018)    -  FBG        377 mg%       ->                              Nov 70/30     70  u 12:00              (02/14/2018)    -  CBG        451  mg%                                       Nov 70/30     80  u 6:00 pm          (02/14/2018)   -  CBG         452  mg%     ->                               Nov 70/30     80  u 9:00 pm          (02/14/2018)   -  CBG         428  mg%       ->  Lantus 120 u  ____ 350 u ++++++++++++++++++++++++++++++++++++++++++++++++++++++++++++++++++++ 8:00   am        (02/15/2018)    -   FBG          288  mg%    ->                              Nov 70/30    80 u 3:00   pm         (02/15/2018)    -  CBG         436 mg%    ->                                Nov 70/30  100 u 7:00   pm         (02/15/2018)    -  CBG         443  mg%    ->                               Nov 70/30  100 u  10:30 pm         (02/15/2018)    -  CBG         401 mg%    -> Lantus 120 u  +      Nov 70/30  100  u  ____ 500 u  ++++++++++++++++++++++++++++++++++++++++++++++++++++++++++++++++++++                                                           NO PIQUAY     8:00   am          (02/16/2018)     -   FBG         232 mg%     ->                              Nov 70/30   80 u 3:00   pm           (02/16/2018)    -   CBG         313 mg%     ->                              Nov 70/30   80 u                                  7:00   pm           (02/16/2018)    -   CBG         184 mg%    ->                               Nov 70/30   20 u  10:00 pm           (02/16/2018)    -   CBG          243 mg%   ->     No Lantus          Nov 70/30   40 u  ______ 220 u ++++++++++++++++++++++++++++++++++++++++++++++++++++++++++++++++++++

## 2018-02-17 ENCOUNTER — Encounter: Payer: Self-pay | Admitting: Internal Medicine

## 2018-02-17 ENCOUNTER — Ambulatory Visit (INDEPENDENT_AMBULATORY_CARE_PROVIDER_SITE_OTHER): Payer: Medicare Other | Admitting: Internal Medicine

## 2018-02-17 VITALS — BP 106/76 | HR 100 | Temp 97.4°F | Resp 16 | Ht 65.0 in | Wt 171.6 lb

## 2018-02-17 DIAGNOSIS — E8881 Metabolic syndrome: Secondary | ICD-10-CM | POA: Diagnosis not present

## 2018-02-17 DIAGNOSIS — R739 Hyperglycemia, unspecified: Secondary | ICD-10-CM | POA: Diagnosis not present

## 2018-02-17 DIAGNOSIS — Z79899 Other long term (current) drug therapy: Secondary | ICD-10-CM | POA: Diagnosis not present

## 2018-02-17 DIAGNOSIS — C50919 Malignant neoplasm of unspecified site of unspecified female breast: Secondary | ICD-10-CM

## 2018-02-17 DIAGNOSIS — T50905A Adverse effect of unspecified drugs, medicaments and biological substances, initial encounter: Principal | ICD-10-CM

## 2018-02-17 LAB — COMPLETE METABOLIC PANEL WITH GFR
AG Ratio: 1.1 (calc) (ref 1.0–2.5)
ALBUMIN MSPROF: 3.6 g/dL (ref 3.6–5.1)
ALT: 72 U/L — ABNORMAL HIGH (ref 6–29)
AST: 101 U/L — ABNORMAL HIGH (ref 10–35)
Alkaline phosphatase (APISO): 88 U/L (ref 33–130)
BUN/Creatinine Ratio: 15 (calc) (ref 6–22)
BUN: 15 mg/dL (ref 7–25)
CALCIUM: 9.1 mg/dL (ref 8.6–10.4)
CO2: 33 mmol/L — ABNORMAL HIGH (ref 20–32)
CREATININE: 1.02 mg/dL — AB (ref 0.50–0.99)
Chloride: 90 mmol/L — ABNORMAL LOW (ref 98–110)
GFR, Est African American: 69 mL/min/{1.73_m2} (ref >=60)
GFR, Est Non African American: 60 mL/min/{1.73_m2} (ref >=60)
GLOBULIN: 3.3 g/dL (ref 1.9–3.7)
Glucose, Bld: 304 mg/dL — ABNORMAL HIGH (ref 65–99)
Potassium: 3.4 mmol/L — ABNORMAL LOW (ref 3.5–5.3)
SODIUM: 134 mmol/L — AB (ref 135–146)
Total Bilirubin: 1.7 mg/dL — ABNORMAL HIGH (ref 0.2–1.2)
Total Protein: 6.9 g/dL (ref 6.1–8.1)

## 2018-02-17 LAB — CBC WITH DIFFERENTIAL/PLATELET
BASOS ABS: 104 {cells}/uL (ref 0–200)
Basophils Relative: 0.9 %
EOS ABS: 128 {cells}/uL (ref 15–500)
EOS PCT: 1.1 %
HCT: 39.3 % (ref 35.0–45.0)
Hemoglobin: 14.3 g/dL (ref 11.7–15.5)
Lymphs Abs: 1810 cells/uL (ref 850–3900)
MCH: 39.7 pg — AB (ref 27.0–33.0)
MCHC: 36.4 g/dL — AB (ref 32.0–36.0)
MCV: 109.2 fL — ABNORMAL HIGH (ref 80.0–100.0)
MONOS PCT: 21.4 %
MPV: 11 fL (ref 7.5–12.5)
NEUTROS ABS: 7076 {cells}/uL (ref 1500–7800)
Neutrophils Relative %: 61 %
PLATELETS: 126 10*3/uL — AB (ref 140–400)
RBC: 3.6 10*6/uL — ABNORMAL LOW (ref 3.80–5.10)
RDW: 13.5 % (ref 11.0–15.0)
TOTAL LYMPHOCYTE: 15.6 %
WBC mixed population: 2482 cells/uL — ABNORMAL HIGH (ref 200–950)
WBC: 11.6 10*3/uL — ABNORMAL HIGH (ref 3.8–10.8)

## 2018-02-17 LAB — MAGNESIUM: Magnesium: 1.7 mg/dL (ref 1.5–2.5)

## 2018-02-17 NOTE — Patient Instructions (Addendum)
(02/08/2018) - FBG  357 mg% -> Nov 70/30 15 u  Total  6:30 pm (02/08/2018) - CBG  482 mg% -> Nov 70/30        30 u 11:00 pm (02/08/2018) - CBG 486 mg% ->Lantus 20 u               ____65 u +++++++++++++++++++++++++++++++++++++++++++++++++++++++++++++++++ 8:30 am (02/09/2018) - FBG 331 mg% -> Nov 70/30       45 u  6:30 pm (02/09/2018) - CBG 410 mg% ->  Nov 70/30     50 u 11:00pm (02/09/2018) -CBG426 mg% ->Lantus 20 u+ Nov 70/30     20 u       _____ 135 u  ++++++++++++++++++++++++++++++++++++++++++++++++++++++++++++++++++++++++++ 9:00 am (02/11/2018)  - FBG 236mg % -> Nov 70/30    20u 6:30 pm (02/11/2018) -  CBG 329mg % -> Nov 70/30  20u  11:00 pm  (02/11/2018)  -  CBG 428 mg%  ->->>Nov 70/30 60 u          _____  100 u  ++++++++++++++++++++++++++++++++++++++++++++++++++++++++++++++++++++++++++ 9:30 am (02/12/2018) - FBG 332 mg% ->Lantus 20 u + Nov 70/30  60 u  4:00 pm (02/12/2018) - CBG 354 mg% -> 7:00 pm (02/12/2018) - CBG 254 mg% ->Nov 70/30 20 u  11:00 pm (02/12/2018) - CBG199mg %->Lantus 60 u               ____ 160 u +++++++++++++++++++++++++++++++++++++++++++++++++++++++++++++++++++++++++++  9:00 am (02/13/2018) - FBG 501mg % ->Nov 70/30 60u 3:30pm (02/13/2018) -  10:00  pm(02/13/2018) - CBG 556 mg% ->Lantus 80u +Nov 70/30 60u              ____200 u +++++++++++++++++++++++++++++++++++++++++++++++++++++++++++++++++++++++++++ 8:30 am  (02/14/2018) - FBG 377 mg% -> Nov 70/30 70u 12:00 (02/14/2018)- CBG    451 mg%   Nov 70/30 80 u 6:00 pm (02/14/2018) - CBG  452 mg% ->Nov 70/30 80 u 9:00 pm (02/14/2018) - CBG  428mg % ->Lantus 120 u              ____350 u  TOTAL   for Friday  +++++++++++++++++++++++++++++++++++++++++++++++++++++++++++++++++++++++++++ 8:00  am  (02/15/2018) - FBG   288mg % ->     Nov 70/30 80 u 3:00  pm  (02/15/2018) - CBG 436mg % ->   Nov 70/30 100u 7:00  pm (02/15/2018) - CBG 443 mg% ->    Nov 70/30 100 u  10:30 pm (02/15/2018) - CBG 401 mg% ->Lantus 120 u +    Nov 70/30 100 u               ____ 500 u    TOTAL for Saturday +++++++++++++++++++++++++++++++++++++++++++++++++++++++++++++++++++++++++++                                                         NO PIQUAY    8:00   am           (02/16/2018)     -   FBG         232 mg%     ->                              Nov 70/30   80 u 3:00   pm           (02/16/2018)    -  CBG         313 mg%     ->                              Nov 70/30   80 u                                  7:00   pm           (02/16/2018)    -   CBG         184 mg%    ->                               Nov 70/30   20 u                                                          10:00 pm           (02/16/2018)    -   CBG          243 mg%   ->     No Lantus          Nov 70/30   40 u                ____  220 u  TOTAL for Sunday ++++++++++++++++++++++++++++++++++++++++++++++++++++++++++++++++++++++++++++ 10:00 am            (02/17/2018)    -   FBG             72 mg%   ->                                 No Insulin                 12:00 pm            (02/17/2018)    -  CBG           202 mg%   ->                                 No Insulin          4:00   pm            (02/17/2018)    -  CBG           299 mg%   ->   at office visit -       Nov 70/30  30 u          At 5:30 pm 11:00 PM            (02/17/2018)    - CBG           22 1  mg%                                        Nov 70/30  15 u  At 11:15 pm                                                                                                                                                                                                      ____  45 units  TOTAL for Mon  +++++++++++++++++++++++++++++++++++++++++++++++++++++++++++++++++++++++++++++ 7:00   Am            (02/18/2018)    -  FBG

## 2018-02-18 ENCOUNTER — Telehealth: Payer: Self-pay | Admitting: Internal Medicine

## 2018-02-18 ENCOUNTER — Encounter: Payer: Self-pay | Admitting: Internal Medicine

## 2018-02-18 ENCOUNTER — Other Ambulatory Visit: Payer: Self-pay | Admitting: Internal Medicine

## 2018-02-18 NOTE — Progress Notes (Signed)
Patient ID: Suzanne [name], female   DOB: Aug 25, 1955, 62 y.o.   MRN: [id number]   (02/08/2018) - FBG  357 mg% -> Nov 70/30 15 u  Total  6:30 pm (02/08/2018) - CBG  482 mg% -> Nov 70/30        30 u 11:00 pm (02/08/2018) - CBG 486 mg% ->Lantus 20 u                                                                                                                                                        ____65 u +++++++++++++++++++++++++++++++++++++++++++++++++++++++++++++++++ 8:30 am (02/09/2018) - FBG 331 mg% -> Nov 70/30       45 u  6:30 pm (02/09/2018) - CBG 410 mg% ->  Nov 70/30     50 u 11:00pm (02/09/2018) -CBG426 mg% ->Lantus 20 u+ Nov 70/30     20 u       _____ 135 u  ++++++++++++++++++++++++++++++++++++++++++++++++++++++++++++++++++++++++++ 9:00 am (02/11/2018)  - FBG 236mg % -> Nov 70/30    20u 6:30 pm (02/11/2018) -  CBG 329mg % -> Nov 70/30  20u  11:00 pm  (02/11/2018)  -  CBG 428 mg%  ->->>Nov 70/30 60 u                                                                                                                                                                    _____ 100 u  ++++++++++++++++++++++++++++++++++++++++++++++++++++++++++++++++++++++++++ 9:30 am (02/12/2018) - FBG 332 mg% ->Lantus 20 u + Nov 70/30  60 u  4:00 pm (02/12/2018) - CBG 354 mg% -> 7:00 pm (02/12/2018) - CBG 254 mg% ->Nov 70/30 20 u   11:00 pm (02/12/2018) - CBG199mg %->Lantus 60 u  ____ 160 u +++++++++++++++++++++++++++++++++++++++++++++++++++++++++++++++++++++++++++  9:00 am (02/13/2018) - FBG 501mg % ->Nov 70/30 60u 3:30pm (02/13/2018) -  10:00 pm(02/13/2018) - CBG 556 mg% ->Lantus 80u +Nov 70/30 60u                                                                                                                                                                        ____200 u +++++++++++++++++++++++++++++++++++++++++++++++++++++++++++++++++++++++++++ 8:30 am (02/14/2018) - FBG 377 mg% ->Nov 70/30 70u 12:00(02/14/2018)- CBG 451 mg% Nov 70/30 80 u 6:00 pm (02/14/2018) - CBG 452 mg% ->Nov 70/30 80 u 9:00 pm (02/14/2018) - CBG 428mg % ->Lantus 120 u                                                                                                                                           ____350 u  TOTAL   for Friday  +++++++++++++++++++++++++++++++++++++++++++++++++++++++++++++++++++++++++++ 8:00 am  (02/15/2018) - FBG 288mg % ->Nov 70/30 80 u 3:00 pm (02/15/2018) - CBG 436mg % ->Nov 70/30 100u 7:00 pm (02/15/2018) - CBG 443 mg% ->Nov 70/30 100 u  10:30 pm (02/15/2018) - CBG  401 mg% ->Lantus 120 u + Nov 70/30 100 u                                                                                                                                     ____500 u    TOTAL for Saturday +++++++++++++++++++++++++++++++++++++++++++++++++++++++++++++++++++++++++++ NO PIQUAY  8:00 am  (02/16/2018) - FBG 232 mg% ->Nov 70/30 80 u 3:00 pm (02/16/2018) - CBG 313 mg% ->Nov 70/30 80 u  7:00 pm (02/16/2018) - CBG 184 mg% ->Nov 70/3020u  10:00 pm (02/16/2018) - CBG 243 mg% ->No Lantus Nov 70/3040 u                                                                                                                                       ____  220 u  TOTAL for Sunday ++++++++++++++++++++++++++++++++++++++++++++++++++++++++++++++++++++++++++++ 10:00 am            (02/17/2018)    -   FBG             72 mg%    ->                                 No Insulin                 12:00 pm            (02/17/2018)    -  CBG           202 mg%    ->                                 No Insulin          4:00   pm            (02/17/2018)    -  CBG           299 mg%    ->   at office visit -       Nov 70/30  30 u          At 5:30 pm 11:00 PM            (02/17/2018)    - CBG           22 1  mg%                                        Nov 70/30  15 u          At 11:15 pm  ____  45 units  TOTAL for Mon  +++++++++++++++++++++++++++++++++++++++++++++++++++++++++++++++++++++++++++++ 8:30    Am             (02/18/2018)    -  FBG            147 mg%     ->                                 Nov 70/30     15 u                               2:00    Pm            (02/18/2018)     -  CBG           215 mg%     ->                                 Nov 70/30     15 u 7:00    Pm             (02/18/2018)    -  CBG           194 mg%     ->                                 Nov 70/30     30 u 10:00  Pm             (02/18/2018)    -  CBG           145 mg%     ->     Lantus   20 u                                                                                                                                                                                                                        ____  80  Units TOTAL for Tues +++++++++++++++++++++++++++++++++++++++++++++++++++++++++++++++++++++++++++++++ 8:00   am              (02/19/2018)    -    FBG               Mg%      ->  Nov 70/30                  2:00   Pm              (02/19/2018)   -   CBG                Mg%      ->                                  Nov 70/30                     7:00  Pm              (02/19/2018)   -   CBG                Mg%      ->                                 Nov 70/30                          11:00  Pm            (02/19/2018)    -   CBG                Mg%      ->                                 Nov 70/30

## 2018-02-19 ENCOUNTER — Other Ambulatory Visit: Payer: Self-pay

## 2018-02-19 ENCOUNTER — Other Ambulatory Visit: Payer: Self-pay | Admitting: Internal Medicine

## 2018-02-19 DIAGNOSIS — C7951 Secondary malignant neoplasm of bone: Secondary | ICD-10-CM

## 2018-02-19 NOTE — Progress Notes (Signed)
(02/08/2018) - FBG  357 mg% ->                            Nov 70/30         15 u   6:30 pm (02/08/2018) - CBG  482 mg% ->        Nov 70/30  30 u 11:00 pm (02/08/2018) - CBG 486 mg% ->Lantus 20 u                                                                                                                                                 ____65 u +++++++++++++++++++++++++++++++++++++++++++++++++++++++++++++++++++++++++ 8:30 am (02/09/2018) - FBG 331 mg% ->        Nov 70/30  45 u  6:30 pm (02/09/2018) - CBG 410 mg% ->        Nov 70/30    50 u 11:00pm (02/09/2018) -CBG426 mg% ->Lantus 20 u+   Nov 70/30    20 u                                                                                                                                                                    ____ 135 u  ++++++++++++++++++++++++++++++++++++++++++++++++++++++++++++++++++++++++++ 9:00 am (02/11/2018)  - FBG 236mg % ->       Nov 70/30   20u 6:30 pm (02/11/2018) -  CBG 329mg % ->      Nov 70/30    20u  11:00 pm  (02/11/2018)  -  CBG 428 mg%  ->->>  Nov 70/30    60 u  ____ 100 u  ++++++++++++++++++++++++++++++++++++++++++++++++++++++++++++++++++++++++++ 9:30 am (02/12/2018) - FBG 332 mg% ->Lantus 20 u +       Nov 70/30  60 u  4:00 pm  (02/12/2018) - CBG 354 mg% -> 7:00 pm (02/12/2018) - CBG 254 mg% ->        Nov 70/30    20 u  11:00 pm (02/12/2018) - CBG199mg %->Lantus 60 u                                                                                                                                                     ____ 160 u +++++++++++++++++++++++++++++++++++++++++++++++++++++++++++++++++++++++++++ 9:00 am (02/13/2018) - FBG 501mg % ->      Nov 70/30    60u 3:30pm (02/13/2018) -  10:00 pm(02/13/2018) - CBG 556 mg% ->Lantus 80u +  Nov 70/30     60u                                                                                                                                                            ____200 u +++++++++++++++++++++++++++++++++++++++++++++++++++++++++++++++++++++++++++ 8:30 am (02/14/2018) - FBG 377 mg% ->     Nov 70/30     70u 12:00(02/14/2018)- CBG 451 mg%       Nov 70/30    80 u 6:00 pm (02/14/2018) - CBG 452 mg% ->     Nov 70/30    80 u 9:00 pm (02/14/2018) - CBG 428mg % ->Lantus 120 u  ____350 u TOTAL for Friday  +++++++++++++++++++++++++++++++++++++++++++++++++++++++++++++++++++++++++++ 8:00 am  (02/15/2018) - FBG 288mg % ->   Nov 70/30      80 u 3:00  pm (02/15/2018) - CBG 436mg % ->    Nov 70/30     100u 7:00 pm (02/15/2018) - CBG 443 mg% ->    Nov 70/30     100 u  10:30 pm (02/15/2018) - CBG 401 mg% ->Lantus 120 u + Nov 70/30     100 u                                                                                                                                                            ____500 uTOTAL for Saturday +++++++++++++++++++++++++++++++++++++++++++++++++++++++++++++++++++++++++++ NO PIQUAY  8:00 am (02/16/2018) - FBG 232 mg% ->  Nov 70/30      80 u 3:00 pm (02/16/2018) - CBG 313 mg% ->   Nov 70/30     80 u  7:00 pm (02/16/2018) - CBG 184 mg% ->   Nov 70/30    20u  10:00 pm (02/16/2018) - CBG 243 mg% ->No Lantus Nov 70/30    40 u                                                                                                                                                         ____ 220 u TOTAL for Sunday ++++++++++++++++++++++++++++++++++++++++++++++++++++++++++++++++++++++++++++ 10:00 am (02/17/2018) - FBG 72 mg%  -> No Insulin  12:00 pm (02/17/2018) - CBG  202 mg%  ->No Insulin   4:00 pm (02/17/2018) - CBG  299 mg%  ->at office visit - Nov 70/30       30 u At 5:30 pm 11:00 PM (02/17/2018) - CBG    221 mg%  Nov 70/30      15  u At 11:15 pm                                  ____ 45 units TOTAL for Mon  +++++++++++++++++++++++++++++++++++++++++++++++++++++++++++++++++++++++++++++ 8:30  Am (02/18/2018) - FBG  147 mg%     ->                                 Nov 70/30        15 u                               2:00    Pm            (02/18/2018)     -  CBG           215 mg%     ->                                 Nov 70/30       15 u 7:00    Pm             (02/18/2018)    -  CBG           194 mg%     ->                                 Nov 70/30        30 u 10:00  Pm             (02/18/2018)    -  CBG           145 mg%     ->     Lantus   20 u                                                                                                                                                                                                                                           ____  80  Units TOTAL for Tues +++++++++++++++++++++++++++++++++++++++++++++++++++++++++++++++++++++++++++++++ 8:00    am              (02/19/2018)    -   FBG            230  Mg%      ->  Nov 70/30     35 u    2:00    Pm              (02/19/2018)   -    CBG             98  Mg%      ->                                  No Insulin                      7:00   Pm              (02/19/2018)   -    CBG           183  Mg%      ->                                  Nov 70/30      20 u             11:00  Pm              (02/19/2018)    -   CBG           139   Mg%     ->    Lantus 25 u                                                                                                                                                                                                               _____  80 Units TOTAL for  Wed ++++++++++++++++++++++++++++++++++++++++++++++++++++++++++++++++++++++++++++++++ 8:00   Am                  (02/20/2018)    -     FBG                       Mg%     ->                                  Nov  70/30           u            2:00   Pm                 (  02/20/2018)     -     CBG                      Mg%     ->                                  Nov  70/30           u           7:00   PM                   (02/20/2018)     -     CBG                      Mg%     ->                                  Nov  70/30           u 10:00 PM                   (02/20/2018)     -     CBG                      Mg%     ->                                  Nov  70/30          u                                                                                                                                                                                 _____       Units TOTAL for Thurs +++++++++++++++++++++++++++++++++++++++++++++++++++++++++++++++++++++++++++++++++

## 2018-02-20 ENCOUNTER — Inpatient Hospital Stay: Payer: Medicare Other

## 2018-02-20 ENCOUNTER — Other Ambulatory Visit: Payer: Self-pay | Admitting: Hematology and Oncology

## 2018-02-20 ENCOUNTER — Other Ambulatory Visit: Payer: Self-pay | Admitting: Internal Medicine

## 2018-02-20 ENCOUNTER — Inpatient Hospital Stay (HOSPITAL_BASED_OUTPATIENT_CLINIC_OR_DEPARTMENT_OTHER): Payer: Medicare Other | Admitting: Hematology and Oncology

## 2018-02-20 ENCOUNTER — Inpatient Hospital Stay: Payer: Medicare Other | Attending: Hematology and Oncology

## 2018-02-20 ENCOUNTER — Telehealth: Payer: Self-pay | Admitting: *Deleted

## 2018-02-20 ENCOUNTER — Telehealth: Payer: Self-pay

## 2018-02-20 ENCOUNTER — Telehealth: Payer: Self-pay | Admitting: Hematology and Oncology

## 2018-02-20 ENCOUNTER — Encounter: Payer: Self-pay | Admitting: Internal Medicine

## 2018-02-20 VITALS — BP 105/78 | Temp 98.4°F | Resp 18

## 2018-02-20 DIAGNOSIS — Z923 Personal history of irradiation: Secondary | ICD-10-CM | POA: Diagnosis not present

## 2018-02-20 DIAGNOSIS — C7800 Secondary malignant neoplasm of unspecified lung: Secondary | ICD-10-CM

## 2018-02-20 DIAGNOSIS — C787 Secondary malignant neoplasm of liver and intrahepatic bile duct: Secondary | ICD-10-CM | POA: Insufficient documentation

## 2018-02-20 DIAGNOSIS — Z79811 Long term (current) use of aromatase inhibitors: Secondary | ICD-10-CM

## 2018-02-20 DIAGNOSIS — Z170 Estrogen receptor positive status [ER+]: Secondary | ICD-10-CM | POA: Insufficient documentation

## 2018-02-20 DIAGNOSIS — Z79818 Long term (current) use of other agents affecting estrogen receptors and estrogen levels: Secondary | ICD-10-CM | POA: Diagnosis not present

## 2018-02-20 DIAGNOSIS — C7801 Secondary malignant neoplasm of right lung: Secondary | ICD-10-CM | POA: Diagnosis not present

## 2018-02-20 DIAGNOSIS — D696 Thrombocytopenia, unspecified: Secondary | ICD-10-CM | POA: Insufficient documentation

## 2018-02-20 DIAGNOSIS — R0602 Shortness of breath: Secondary | ICD-10-CM | POA: Insufficient documentation

## 2018-02-20 DIAGNOSIS — C50511 Malignant neoplasm of lower-outer quadrant of right female breast: Secondary | ICD-10-CM

## 2018-02-20 DIAGNOSIS — Z9221 Personal history of antineoplastic chemotherapy: Secondary | ICD-10-CM

## 2018-02-20 DIAGNOSIS — D709 Neutropenia, unspecified: Secondary | ICD-10-CM | POA: Insufficient documentation

## 2018-02-20 DIAGNOSIS — C7951 Secondary malignant neoplasm of bone: Secondary | ICD-10-CM

## 2018-02-20 DIAGNOSIS — R7989 Other specified abnormal findings of blood chemistry: Secondary | ICD-10-CM | POA: Diagnosis not present

## 2018-02-20 LAB — CBC WITH DIFFERENTIAL (CANCER CENTER ONLY)
Basophils Absolute: 0.1 10*3/uL (ref 0.0–0.1)
Basophils Relative: 1 %
Eosinophils Absolute: 0.2 10*3/uL (ref 0.0–0.5)
Eosinophils Relative: 2 %
HEMATOCRIT: 34 % — AB (ref 34.8–46.6)
HEMOGLOBIN: 11.9 g/dL (ref 11.6–15.9)
LYMPHS ABS: 1.4 10*3/uL (ref 0.9–3.3)
LYMPHS PCT: 18 %
MCH: 39 pg — AB (ref 25.1–34.0)
MCHC: 35 g/dL (ref 31.5–36.0)
MCV: 111.5 fL — ABNORMAL HIGH (ref 79.5–101.0)
MONOS PCT: 27 %
Monocytes Absolute: 2.1 10*3/uL — ABNORMAL HIGH (ref 0.1–0.9)
NEUTROS ABS: 4.1 10*3/uL (ref 1.5–6.5)
Neutrophils Relative %: 52 %
Platelet Count: 172 10*3/uL (ref 145–400)
RBC: 3.05 MIL/uL — ABNORMAL LOW (ref 3.70–5.45)
RDW: 14.2 % (ref 11.2–14.5)
WBC Count: 7.8 10*3/uL (ref 3.9–10.3)

## 2018-02-20 LAB — CMP (CANCER CENTER ONLY)
ALT: 91 U/L — AB (ref 0–44)
ANION GAP: 8 (ref 5–15)
AST: 136 U/L — ABNORMAL HIGH (ref 15–41)
Albumin: 3.1 g/dL — ABNORMAL LOW (ref 3.5–5.0)
Alkaline Phosphatase: 110 U/L (ref 38–126)
BUN: 10 mg/dL (ref 6–20)
CHLORIDE: 90 mmol/L — AB (ref 98–111)
CO2: 37 mmol/L — AB (ref 22–32)
Calcium: 8.9 mg/dL (ref 8.9–10.3)
Creatinine: 0.98 mg/dL (ref 0.44–1.00)
GFR, Estimated: >60 mL/min (ref >60)
Glucose, Bld: 167 mg/dL — ABNORMAL HIGH (ref 70–99)
POTASSIUM: 3.9 mmol/L (ref 3.5–5.1)
SODIUM: 135 mmol/L (ref 135–145)
Total Bilirubin: 1.3 mg/dL — ABNORMAL HIGH (ref 0.3–1.2)
Total Protein: 6.6 g/dL (ref 6.5–8.1)

## 2018-02-20 MED ORDER — FULVESTRANT 250 MG/5ML IM SOLN
500.0000 mg | Freq: Once | INTRAMUSCULAR | Status: AC
  Administered 2018-02-20: 500 mg via INTRAMUSCULAR

## 2018-02-20 MED ORDER — DEXAMETHASONE 1 MG PO TABS: 1.0000 mg | ORAL_TABLET | Freq: Two times a day (BID) | ORAL | Status: DC

## 2018-02-20 MED ORDER — FENTANYL 25 MCG/HR TD PT72: 25.0000 ug | MEDICATED_PATCH | TRANSDERMAL | 0 refills | Status: DC

## 2018-02-20 MED ORDER — HYDROMORPHONE HCL 2 MG PO TABS: 2.0000 mg | ORAL_TABLET | ORAL | 0 refills | Status: DC | PRN

## 2018-02-20 MED ORDER — FULVESTRANT 250 MG/5ML IM SOLN
INTRAMUSCULAR | Status: AC
  Filled 2018-02-20: qty 5

## 2018-02-20 MED FILL — fentaNYL 25 MCG/HR PT72: 25 | 15 days supply | Qty: 5 | Fill #0

## 2018-02-20 MED FILL — HYDROmorphone HCL 2 MG TABS: 2 | 22 days supply | Qty: 90 | Fill #0

## 2018-02-20 NOTE — Telephone Encounter (Signed)
Gave patient avs and calendar of upcoming appts.  °
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
## 2018-02-20 NOTE — Telephone Encounter (Signed)
(02/08/2018) - FBG 357 mg% ->                           Nov 70/30         15 u  6:30 pm (02/08/2018) - CBG 482 mg% ->       Nov 70/30 30 u 11:00 pm (02/08/2018) - CBG 486 mg% ->Lantus 20 u        ____65 u +++++++++++++++++++++++++++++++++++++++++++++++++++++++++++++++++++++++++ 8:30 am (02/09/2018) - FBG 331 mg% ->       Nov 70/30 45 u  6:30 pm (02/09/2018) - CBG 410 mg% ->      Nov 70/30   50 u 11:00pm (02/09/2018) -CBG426 mg% ->Lantus 20 u+  Nov 70/30   20 u                                                                                                                                                                   ____ 135 u  ++++++++++++++++++++++++++++++++++++++++++++++++++++++++++++++++++++++++++ 9:00 am (02/11/2018) - FBG 236mg % ->      Nov 70/30 20u 6:30 pm (02/11/2018) - CBG 329mg % ->     Nov 70/30   20u  11:00 pm (02/11/2018) - CBG428 mg% ->->>  Nov 70/30    60 u  ____ 100 u  ++++++++++++++++++++++++++++++++++++++++++++++++++++++++++++++++++++++++++ 9:30 am (02/12/2018) - FBG 332 mg% ->Lantus 20 u +         Nov 70/30 60 u  4:00 pm  (02/12/2018) - CBG 354 mg% -> 7:00 pm (02/12/2018) - CBG 254 mg% ->        Nov 70/30   20 u  11:00 pm (02/12/2018) - CBG199mg %->Lantus 60 u   ____ 160 u +++++++++++++++++++++++++++++++++++++++++++++++++++++++++++++++++++++++++++ 9:00 am (02/13/2018) - FBG 501mg % ->      Nov 70/30    60u 3:30pm (02/13/2018) -  10:00 pm(02/13/2018) - CBG 556 mg% ->Lantus 80u +      Nov 70/30     60u  ____200 u +++++++++++++++++++++++++++++++++++++++++++++++++++++++++++++++++++++++++++ 8:30 am (02/14/2018) - FBG 377 mg% ->      Nov 70/30     70u 12:00(02/14/2018)- CBG 451 mg%       Nov 70/30     80 u 6:00 pm (02/14/2018) - CBG 452 mg% ->     Nov 70/30     80 u 9:00 pm (02/14/2018) - CBG 428mg % ->Lantus 120 u                                       ____350 u TOTAL for Friday  +++++++++++++++++++++++++++++++++++++++++++++++++++++++++++++++++++++++++++ 8:00 am (02/15/2018) - FBG 288mg % ->   Nov 70/30      80  u 3:00 pm (02/15/2018) - CBG 436mg % ->    Nov 70/30     100u 7:00 pm (02/15/2018) - CBG 443 mg% ->    Nov 70/30     100 u  10:30 pm (02/15/2018) - CBG 401 mg% ->Lantus 120 u +     Nov 70/30     100 u                                       ____500 uTOTAL for Saturday +++++++++++++++++++++++++++++++++++++++++++++++++++++++++++++++++++++++++++ NO PIQUAY  8:00 am (02/16/2018) - FBG 232 mg% ->  Nov 70/30      80 u 3:00 pm (02/16/2018) - CBG 313 mg% ->   Nov 70/30     80 u  7:00 pm (02/16/2018) - CBG 184 mg% ->   Nov 70/30    20u  10:00 pm (02/16/2018) - CBG 243 mg% ->No Lantus    Nov 70/30    40 u                                 ____ 220 u TOTAL for Sunday ++++++++++++++++++++++++++++++++++++++++++++++++++++++++++++++++++++++++++++ 10:00 am (02/17/2018) - FBG 72 mg% -> No Insulin  12:00 pm (02/17/2018) - CBG  202 mg% ->No Insulin   4:00 pm (02/17/2018) - CBG  299 mg% ->at office visit -   Nov 70/30       30 u At 5:30 pm 11:00 PM (02/17/2018)  - CBG   221 mg%  Nov 70/30      15  u At 11:15 pm                                  ____ 45 units TOTAL for Mon  +++++++++++++++++++++++++++++++++++++++++++++++++++++++++++++++++++++++++++++ 8:30Am (02/18/2018) - FBG147 mg% -> Nov 70/30   15 u  2:00 Pm (02/18/2018) - CBG 215mg % ->Nov 70/30   15 u 7:00 Pm (02/18/2018) - CBG 194 mg% ->Nov 70/30    30 u 10:00 Pm (02/18/2018) - CBG 145 mg% ->Lantus 20 u                                           ____ 80Units TOTAL for Tues +++++++++++++++++++++++++++++++++++++++++++++++++++++++++++++++++++++++++++++++ 8:00  am (02/19/2018) - FBG  230 Mg%  ->Nov 70/30  35 u 2:00  Pm (02/19/2018) -  CBG    98Mg %  ->No Insulin   7:00  Pm (02/19/2018) -  CBG  183  Mg%  ->Nov 70/30  20 u 11:00 Pm   (02/19/2018) - CBG 139 Mg% ->Lantus 25 u  _____  80 Units TOTAL for  Wed ++++++++++++++++++++++++++++++++++++++++++++++++++++++++++++++++++++++++++++++++ 8:00   Am          (02/20/2018)     -     FBG       122    Mg%     ->                              Nov  70/30    20  u            1:00   Pm                  (02/20/2018)     -     CBG        71    Mg%     ->                              No Insulin                   6:00   PM                   (02/20/2018)     -    CBG      103    Mg%     ->                               No Insulin         10:00 PM                   (02/20/2018)     -    CBG      154    Mg%     ->   Lantus 20 u                                                                                                                                                                                                                       _____  40 Units TOTAL for Thurs +++++++++++++++++++++++++++++++++++++++++++++++++++++++++++++++++++++++++++++++++  Restart PiQuay 150 mg  8:00    Am                  (02/21/2018)      -    FBG                Mg%      ->                              Nov 70/30                                     1:00    Am                  (7/12/.2019)     -    CBG                Mg%      ->                              Nov 70/30                                   6:00    Pm                  (02/21/2018)      -    CBG                Mg%      ->                              Nov 70/30                                  10:00  Pm                  (02/21/2018)      -    CBG                Mg%      ->                              Nov 70/30  _____       Units TOTAL  for Fri  +++++++++++++++++++++++++++++++++++++++++++++++++++++++++++++++++++++++++++++++++

## 2018-02-20 NOTE — Telephone Encounter (Signed)
"  Kathlee Nations with [facility] calling to request orders for this patient seen today, here to pickup prescriptions yet they all were not sent in."  Read medications ordered with today's visit.  "We received Fentanyl, Hydromorphone but did not receive dexamethasone."  Verbal order provided with this call for quantity of sixty.  Kathlee Nations denies any further needs or questions in reference to medication orders at this time.

## 2018-02-20 NOTE — Telephone Encounter (Signed)
Per Dr Lindi Adie, called [facility] to cancel Dexamethasone- per [facility] in pharmacy, she will cancel and reason if pt asks, per Dr Lindi Adie, he prefers not to order at this time due to her high blood sugar.  Also reordered pt's blood sugar supplies ie lancets and test strips for at least 4 times a day with 3 refills.  [facility] voiced understanding.
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
## 2018-02-21 ENCOUNTER — Other Ambulatory Visit: Payer: Self-pay | Admitting: Internal Medicine

## 2018-02-21 NOTE — Progress Notes (Signed)
(02/08/2018) - FBG 357 mg% -> Nov 70/30  15 u  6:30 pm (02/08/2018) - CBG 482 mg% -> Nov 70/30 30 u 11:00 pm (02/08/2018) - CBG 486 mg% ->Lantus 20 u   ____65 u +++++++++++++++++++++++++++++++++++++++++++++++++++++++++++++++++++++++++ 8:30 am (02/09/2018) - FBG 331 mg% -> Nov 70/30 45 u  6:30 pm (02/09/2018) - CBG 410 mg% -> Nov 70/30  50 u 11:00pm (02/09/2018) -CBG426 mg% ->Lantus 20 u+ Nov 70/30  20 u  ____ 135 u  ++++++++++++++++++++++++++++++++++++++++++++++++++++++++++++++++++++++++++ 9:00 am (02/11/2018) - FBG 236mg % -> Nov 70/30 20u 6:30 pm (02/11/2018) - CBG 329mg % -> Nov 70/30  20u  11:00 pm (02/11/2018) - CBG428 mg% ->->> Nov 70/30  60 u  ____100 u  ++++++++++++++++++++++++++++++++++++++++++++++++++++++++++++++++++++++++++ 9:30 am (02/12/2018) - FBG 332 mg% ->Lantus 20 u +  Nov 70/30 60 u  4:00 pm (02/12/2018)  - CBG 354 mg% -> 7:00 pm (02/12/2018) - CBG 254 mg% -> Nov 70/30  20 u  11:00 pm (02/12/2018) - CBG199mg %->Lantus 60 u  ____ 160 u +++++++++++++++++++++++++++++++++++++++++++++++++++++++++++++++++++++++++++ 9:00 am (02/13/2018) - FBG 501mg % -> Nov 70/30  60u 3:30pm (02/13/2018) -  10:00 pm(02/13/2018) - CBG 556 mg% ->Lantus 80u + Nov 70/30  60u  ____200 u +++++++++++++++++++++++++++++++++++++++++++++++++++++++++++++++++++++++++++ 8:30 am (02/14/2018) - FBG 377 mg% -> Nov 70/30  70u 12:00(02/14/2018)- CBG 451 mg%  Nov 70/30  80 u 6:00 pm (02/14/2018) - CBG 452 mg% -> Nov 70/30  80 u 9:00 pm (02/14/2018) - CBG 428mg % ->Lantus 120 u ____350 u TOTAL for Friday  +++++++++++++++++++++++++++++++++++++++++++++++++++++++++++++++++++++++++++ 8:00 am (02/15/2018) - FBG 288mg % -> Nov 70/30  80 u 3:00 pm  (02/15/2018) - CBG 436mg % -> Nov 70/30  100u 7:00 pm (02/15/2018) - CBG 443 mg% -> Nov 70/30  100 u  10:30 pm (02/15/2018) - CBG 401 mg% ->Lantus 120 u + Nov 70/30  100 u   ____500 uTOTAL for Saturday +++++++++++++++++++++++++++++++++++++++++++++++++++++++++++++++++++++++++++ NO PIQUAY  8:00 am (02/16/2018) - FBG 232 mg% ->   Nov 70/30  80 u 3:00 pm (02/16/2018) - CBG 313 mg% ->   Nov 70/30  80 u  7:00 pm (02/16/2018) - CBG 184 mg% ->   Nov 70/30 20u  10:00 pm (02/16/2018) - CBG 243 mg% ->No Lantus   Nov 70/30 40 u ____ 220 u TOTAL for Sunday ++++++++++++++++++++++++++++++++++++++++++++++++++++++++++++++++++++++++++++ 10:00 am (02/17/2018) - FBG 72 mg% ->   No Insulin  12:00 pm (02/17/2018) - CBG  202 mg% ->  No Insulin   4:00 pm (02/17/2018) - CBG  299 mg% ->at office visit -   Nov 70/30  30 u At 5:30 pm 11:00 PM (02/17/2018) - CBG   221 mg%    Nov 70/30  15 u At 11:15 pm  ____ 45 units TOTAL for Mon  +++++++++++++++++++++++++++++++++++++++++++++++++++++++++++++++++++++++++++++ 8:30Am (02/18/2018) - FBG147 mg% ->  Nov 70/30 15 u  2:00 Pm (02/18/2018) - CBG 215mg % ->  Nov 70/30 15 u 7:00 Pm (02/18/2018) - CBG 194 mg% ->  Nov 70/30 30 u 10:00 Pm (02/18/2018) - CBG 145 mg% ->Lantus 20 u    ____ 80Units TOTAL for Tues +++++++++++++++++++++++++++++++++++++++++++++++++++++++++++++++++++++++++++++++ 8:00 am (02/19/2018) - FBG230 Mg% -> Nov 70/3035 u 2:00 Pm (02/19/2018) - CBG 98 Mg% ->No Insulin 7:00 Pm (02/19/2018) - CBG183 Mg% ->Nov 70/3020 u 11:00 Pm (02/19/2018) - CBG139Mg % ->Lantus 25 u _____ 80 Units TOTAL for Wed ++++++++++++++++++++++++++++++++++++++++++++++++++++++++++++++++++++++++++++++++ 8:00 AM  (02/20/2018) -  FBG 122  Mg% -> Nov 70/30 20u  1:00 PM  (02/20/2018) -  CBG  71Mg % -> No Insulin  6:00 PM (02/20/2018) - CBG 103Mg % ->  No Insulin  10:00 PM (02/20/2018) -  CBG 154 Mg% ->Lantus 20 u        _____ 40Units TOTAL for Thurs ++++++++++++++++++++++++++++++++++++++++++++++++++++++++++++++++++++++++++++++++                                                                                          Restart PiQuay 150 mg - 1 tablet  8:00  AM (02/21/2018) -    FBG 176Mg %  ->    Nov 70/30 15u  2:30    PM      (02/21/2018) -     CBG 149 Mg% ->                                                                           Episode of N/V 2 hr after took PiQuay  & Metformin at 2:30 pm  6:30  PM (02/21/2018) -    CBG 261Mg % ->   Nov 70/30 15u 10:00  PM (02/21/2018) -      CBG Mg% ->Lantus 20       _____ 50  Units TOTAL for Fri ++++++++++++++++++++++++++++++++++++++++++++++++++++++++++++++++++++++++++++++++                                                                                          Restart PiQuay 150 mg - 1 tablet  8:00 AM   (02/22/2018)  -      FBG 132  Mg%  ->  Nov 70/30 15 u  2:30 PM   (02/22/2018) -       CBG  232 Mg% ->    u  6:30 PM (02/22/2018)  -      CBG 132 Mg% ->  Nov 70/30 10 u 10:00 PM   (02/22/2018)  -       CBG Mg% ->  Nov 70/30   u _____       Units total for Sat  +++++++++++++++++++++++++++++++++++++++++++++++++++++++++++++++++++++++++++++++

## 2018-02-22 ENCOUNTER — Other Ambulatory Visit: Payer: Self-pay | Admitting: Internal Medicine

## 2018-02-22 NOTE — Progress Notes (Signed)
(02/08/2018) - FBG 357 mg% ->           Nov 70/30  15 u  6:30 pm (02/08/2018) - CBG 482 mg% ->           Nov 70/30 30 u 11:00 pm (02/08/2018) - CBG 486 mg% ->Lantus 20 u                                         ____65 u +++++++++++++++++++++++++++++++++++++++++++++++++++++++++++++++++++++++++ 8:30 am (02/09/2018) - FBG 331 mg% ->          Nov 70/30 45 u  6:30 pm (02/09/2018) - CBG 410 mg% ->          Nov 70/30  50 u 11:00pm (02/09/2018) -CBG426 mg% ->     Lantus 20 u+          Nov 70/30  20 u  ____ 135 u  ++++++++++++++++++++++++++++++++++++++++++++++++++++++++++++++++++++++++++ 9:00 am (02/11/2018) - FBG 236mg % ->          Nov 70/30 20u 6:30 pm (02/11/2018) - CBG 329mg % ->           Nov 70/30  20u  11:00 pm (02/11/2018) - CBG428 mg% ->     ->        Nov 70/30  60 u  ____100 u  ++++++++++++++++++++++++++++++++++++++++++++++++++++++++++++++++++++++++++ 9:30 am (02/12/2018) - FBG 332 mg%  ->      Lantus 20 u +    Nov 70/30 60 u  4:00 pm (02/12/2018) - CBG  354 mg% -> 7:00 pm (02/12/2018) - CBG 254 mg% ->        Nov 70/30  20 u  11:00 pm (02/12/2018) - CBG199mg %->      Lantus 60 u  ____ 160 u +++++++++++++++++++++++++++++++++++++++++++++++++++++++++++++++++++++++++++ 9:00 am (02/13/2018) - FBG  501mg % ->         Nov 70/30  60u 3:30pm (02/13/2018) -  10:00 pm(02/13/2018) - CBG  556 mg% ->    Lantus 80u +       Nov 70/30    60u  ____200 u +++++++++++++++++++++++++++++++++++++++++++++++++++++++++++++++++++++++++++ 8:30 am (02/14/2018) - FBG 377 mg% ->          Nov 70/30  70u 12:00(02/14/2018)- CBG 451 mg%           Nov 70/30  80 u 6:00 pm (02/14/2018) - CBG 452 mg% ->          Nov 70/30  80 u 9:00 pm (02/14/2018) - CBG 428mg % ->      Lantus 120 u ____350 u TOTAL for Friday   +++++++++++++++++++++++++++++++++++++++++++++++++++++++++++++++++++++++++++ 8:00 am (02/15/2018) - FBG 288mg % ->           Nov 70/30  80 u 3:00 pm (02/15/2018) - CBG 436mg % ->           Nov 70/30  100u 7:00 pm (02/15/2018) - CBG 443 mg% ->           Nov 70/30  100 u  10:30 pm (02/15/2018) - CBG 401 mg% ->          Lantus 120 u +   Nov 70/30  100 u  ____500 uTOTAL for Saturday +++++++++++++++++++++++++++++++++++++++++++++++++++++++++++++++++++++++++++ NO PIQUAY  8:00 am (02/16/2018) - FBG 232 mg% ->          Nov 70/30  80 u 3:00 pm (02/16/2018) - CBG 313 mg% ->         Nov 70/30  80 u  7:00 pm (02/16/2018) - CBG 184 mg% ->         Nov 70/30 20u  10:00 pm (02/16/2018) - CBG 243 mg% ->      No Lantus      Nov 70/30 40 u ____ 220 u TOTAL for Sunday ++++++++++++++++++++++++++++++++++++++++++++++++++++++++++++++++++++++++++++ 10:00 am (02/17/2018) - FBG 72 mg% ->      No Insulin  12:00 pm (02/17/2018) - CBG  202 mg% ->      No  Insulin   4:00 pm (02/17/2018) - CBG  299 mg% ->at office visit -       Nov 70/30  30 u At 5:30 pm 11:00 PM (02/17/2018) - CBG  221 mg%       Nov 70/30  15 u At 11:15 pm ____ 45 units TOTAL for Mon  +++++++++++++++++++++++++++++++++++++++++++++++++++++++++++++++++++++++++++++ 8:30Am (02/18/2018) - FBG147 mg% ->     Nov 70/30 15 u  2:00 Pm (02/18/2018) - CBG 215mg% ->    Nov 70/30 15 u 7:00 Pm (02/18/2018) - CBG 194 mg% ->    Nov 70/30 30 u 10:00 Pm (02/18/2018) - CBG 145 mg% ->Lantus 20 u           ____ 80Units TOTAL for Tues +++++++++++++++++++++++++++++++++++++++++++++++++++++++++++++++++++++++++++++++ 8:00 am (02/19/2018) - FBG230Mg% ->  Nov 70/3035 u 2:00 Pm (02/19/2018) - CBG 98 Mg% ->No Insulin 7:00 Pm (02/19/2018) - CBG183Mg% ->Nov 70/3020 u 11:00 Pm (02/19/2018) - CBG139Mg% ->Lantus 25  u     ____  80 Units TOTAL for Wed ++++++++++++++++++++++++++++++++++++++++++++++++++++++++++++++++++++++++++++++++ 8:00 AM(02/20/2018) - FBG122Mg% ->  Nov 70/3020u 1:00PM(02/20/2018) - CBG 71Mg% ->  No Insulin 6:00 PM (02/20/2018) - CBG103Mg% ->  No Insulin 10:00 PM (02/20/2018) - CBG154Mg% ->Lantus 20 u   ____40Units total  for Thurs ++++++++++++++++++++++++++++++++++++++++++++++++++++++++++++++++++++++++++++++++ Restart PiQuay 150 mg - 1 tablet  8:00  AM (02/21/2018) - FBG 176Mg%  ->      Nov 70/30 15u  2:30    PM    (02/21/2018) - CBG 149 Mg% ->                                                                               Episode of N/V 2 hr after took PiQuay  & Metformin at 2:30 pm  6:30  PM (02/21/2018) - CBG 261Mg%   ->    Nov 70/30 15u 10:00  PM (02/21/2018) -  CBG 236Mg% ->Lantus25        Nov 70/30 u    ____ 55Units  total for Fri +++++++++++++++++++++++++++++++++++++++++++++++++++++++++++++++++++++++++++++++  PiQuay 150 mg 1 tablet                  (Metformin on hold -> ) 8:00 AM  (02/22/2018)  -   FBG     14 5   Mg% ->     Nov 70/30   10u   2:30 PM (02/22/2018) -   CBG 112    Mg% ->        6:30 PM (02/22/2018)  -   CBG 213  Mg% ->    Nov 70/30 10 u 10:00 PM   (02/22/2018)  -   CBG 235     Mg% ->Lantus 25  ____   45  Units total for Sat  ++++++++++++++++++++++++++++++++++++++++++++++++++++++++++++++++++++++++++++++++  PiQuay 150 mg - 1 tablet  8:00 AM  (02/23/2018)  -   FBG                Mg% ->     Nov 70/30       u  2:30 PM (02/23/2018) -   CBG              Mg% ->     Nov 70/30       u   6:30 PM (02/23/2018)  -   CBG            Mg% ->    Nov 70/30        u 10:00 PM   (02/23/2018)  -   CBG               Mg% ->Lantus              Nov 70/30    u  _____        Units total for Sun

## 2018-02-23 ENCOUNTER — Other Ambulatory Visit: Payer: Self-pay | Admitting: Internal Medicine

## 2018-02-23 DIAGNOSIS — T451X5A Adverse effect of antineoplastic and immunosuppressive drugs, initial encounter: Principal | ICD-10-CM

## 2018-02-23 DIAGNOSIS — R112 Nausea with vomiting, unspecified: Secondary | ICD-10-CM

## 2018-02-23 MED ORDER — ONDANSETRON HCL 8 MG PO TABS: ORAL_TABLET | ORAL | 3 refills | Status: DC

## 2018-02-23 NOTE — Progress Notes (Signed)
(02/08/2018) - FBG 357 mg% ->           Nov 70/30  15 u  6:30 pm (02/08/2018) - CBG 482 mg% ->              Nov 70/30 30 u 11:00 pm (02/08/2018) - CBG 486 mg% ->Lantus 20 u                                         ____65 u +++++++++++++++++++++++++++++++++++++++++++++++++++++++++++++++++++++++++ 8:30 am (02/09/2018) - FBG 331 mg% ->          Nov 70/30 45 u  6:30 pm (02/09/2018) - CBG 410 mg% ->          Nov 70/30  50 u 11:00pm (02/09/2018) -CBG426 mg% ->     Lantus 20 u+             Nov 70/30  20 u  ____ 135 u  ++++++++++++++++++++++++++++++++++++++++++++++++++++++++++++++++++++++++++ 9:00 am (02/11/2018) - FBG 236mg % ->          Nov 70/30 20u 6:30 pm (02/11/2018) - CBG 329mg % ->           Nov 70/30  20u  11:00 pm (02/11/2018) - CBG428 mg% ->     ->            Nov 70/30  60 u  ____100 u  ++++++++++++++++++++++++++++++++++++++++++++++++++++++++++++++++++++++++++ 9:30 am (02/12/2018) - FBG 332  mg% ->      Lantus 20 u +       Nov 70/30 60 u  4:00 pm (02/12/2018) - CBG  354 mg% -> 7:00 pm (02/12/2018) - CBG 254 mg% ->        Nov 70/30  20 u  11:00 pm (02/12/2018) - CBG199mg %->      Lantus 60 u  ____ 160 u +++++++++++++++++++++++++++++++++++++++++++++++++++++++++++++++++++++++++++ 9:00 am (02/13/2018) - FBG  501mg % ->         Nov 70/30  60u 3:30pm (02/13/2018) -  10:00 pm(02/13/2018) - CBG  556 mg% ->    Lantus 80u +          Nov 70/30    60u  ____200 u +++++++++++++++++++++++++++++++++++++++++++++++++++++++++++++++++++++++++++ 8:30 am (02/14/2018) - FBG 377 mg% ->          Nov 70/30  70u 12:00(02/14/2018)- CBG 451 mg%           Nov 70/30  80 u 6:00 pm (02/14/2018) - CBG 452 mg% ->          Nov 70/30  80 u 9:00 pm (02/14/2018) - CBG 428mg % ->      Lantus 120 u ____350 u TOTAL for Friday   +++++++++++++++++++++++++++++++++++++++++++++++++++++++++++++++++++++++++++ 8:00 am (02/15/2018) - FBG 288mg % ->           Nov 70/30  80 u 3:00 pm (02/15/2018) - CBG 436mg % ->           Nov 70/30  100u 7:00 pm (02/15/2018) - CBG 443 mg% ->           Nov 70/30  100 u  10:30 pm (02/15/2018) - CBG 401 mg% ->          Lantus 120 u +   Nov 70/30  100 u  ____500 uTOTAL for Saturday +++++++++++++++++++++++++++++++++++++++++++++++++++++++++++++++++++++++++++ NO PIQUAY  8:00 am (02/16/2018) - FBG 232 mg% ->          Nov 70/30  80 u 3:00 pm (  02/16/2018) - CBG 313 mg% ->          Nov 70/30  80 u  7:00 pm (02/16/2018) - CBG 184 mg% ->           Nov 70/30 20u  10:00 pm (02/16/2018) - CBG 243 mg% ->      No Lantus      Nov 70/30 40 u ____ 220 u TOTAL for Sunday ++++++++++++++++++++++++++++++++++++++++++++++++++++++++++++++++++++++++++++ 10:00 am (02/17/2018) - FBG 72 mg% ->      No Insulin  12:00 pm (02/17/2018) - CBG  202 mg% ->       No Insulin   4:00 pm (02/17/2018) - CBG  299 mg% ->at office visit -      Nov 70/30  30 u At 5:30 pm 11:00 PM (02/17/2018) - CBG  221 mg%      Nov 70/30  15 u At 11:15 pm ____ 45 units TOTAL for Mon  +++++++++++++++++++++++++++++++++++++++++++++++++++++++++++++++++++++++++++++ 8:30Am (02/18/2018) - FBG147 mg% ->       Nov 70/30 15 u  2:00 Pm (02/18/2018) - CBG 215mg% ->      Nov 70/30 15 u 7:00 Pm (02/18/2018) - CBG 194 mg% ->       Nov 70/30 30 u 10:00 Pm (02/18/2018) - CBG 145 mg% ->Lantus 20 u           ____ 80Units TOTAL for Tues +++++++++++++++++++++++++++++++++++++++++++++++++++++++++++++++++++++++++++++++ 8:00 am (02/19/2018) - FBG230Mg% ->    Nov 70/3035 u 2:00 Pm (02/19/2018) - CBG 98 Mg% ->   No Insulin 7:00 Pm (02/19/2018) - CBG183Mg% ->   Nov 70/3020 u 11:00 Pm (02/19/2018) - CBG139Mg% ->Lantus 25  u     ____  80 Units TOTAL for Wed ++++++++++++++++++++++++++++++++++++++++++++++++++++++++++++++++++++++++++++++++ 8:00 AM(02/20/2018) - FBG122Mg% ->  Nov 70/3020u 1:00PM(02/20/2018) - CBG 71Mg% ->  No Insulin 6:00 PM (02/20/2018) - CBG103Mg% ->  No Insulin 10:00 PM (02/20/2018) - CBG154Mg% ->Lantus 20 u   ____40Units total  for Thurs ++++++++++++++++++++++++++++++++++++++++++++++++++++++++++++++++++++++++++++++++ Restart PiQuay 150 mg - 1 tablet  8:00  AM (02/21/2018) - FBG 176Mg%  ->   Nov 70/30 15u  2:30PM(02/21/2018) - CBG 149 Mg% ->       Episode of N/V 2 hr after took PiQuay &Metformin at 2:30 pm  6:30 PM (02/21/2018) - CBG 261Mg%   ->  Nov 70/30 15u 10:00 PM (02/21/2018) - CBG 236Mg% ->Lantus25 u     ____ 55Units total for  Fri +++++++++++++++++++++++++++++++++++++++++++++++++++++++++++++++++++++++++++++++  PiQuay 150 mg 1 tablet           (day 1)             (Metformin on hold -> ) 8:00  AM  (02/22/2018)  -  FBG    145   Mg% ->    Nov 70/30 10u   2:30 PM(02/22/2018) - CBG 112    Mg% ->        6:30 PM (02/22/2018)  -  CBG 213  Mg% ->    Nov 70/30 10 u 10:00  PM  (02/22/2018)  -  CBG 235     Mg% ->Lantus 25 u          ____  45 Units total for Sat  ++++++++++++++++++++++++++++++++++++++++++++++++++++++++++++++++++++++++++++++++  PiQuay 150 mg - 1 tablet        (day 2)  8:00  AM  (02/23/2018)  -  FBG     17 1  Mg% ->      Nov 70/30   10u  2:00 PM (02/23/2018) - CBG  207    Mg% ->     Nov 70/30  10   u   6:00 PM  (02/23/2018)  -  CBG     191    Mg% ->    Nov 70/30    10  u 10:00  PM   (02/23/2018)  -  CBG     245    Mg% ->Lantus 35 u         Nov 70/30  10  u _____  75 Units total for  Sun +++++++++++++++++++++++++++++++++++++++++++++++++++++++++++++++++++++++++++++++++  PiQuay 150 mg - 1 tablet       (day 3) 8:00  AM  (7/152019)   -  FBG              Mg% ->      Nov 70/30       u  2:00 PM (02/24/2018) - CBG                Mg% ->     Nov 70/30         u   6:00 PM  (02/24/2018)  -  CBG                Mg% ->    Nov 70/30          u 10:00  PM   (02/24/2018)  -  CBG                Mg% ->Lantus     u          Nov 70/30        u _____       Units total for Mon

## 2018-02-24 ENCOUNTER — Encounter: Payer: Self-pay | Admitting: Internal Medicine

## 2018-02-24 ENCOUNTER — Other Ambulatory Visit: Payer: Self-pay | Admitting: Hematology and Oncology

## 2018-02-24 ENCOUNTER — Ambulatory Visit: Payer: Medicare Other | Admitting: Internal Medicine

## 2018-02-24 VITALS — BP 118/76 | HR 84 | Temp 97.3°F | Resp 16 | Ht 65.0 in | Wt 177.0 lb

## 2018-02-24 DIAGNOSIS — C50919 Malignant neoplasm of unspecified site of unspecified female breast: Secondary | ICD-10-CM

## 2018-02-24 DIAGNOSIS — R739 Hyperglycemia, unspecified: Secondary | ICD-10-CM

## 2018-02-24 DIAGNOSIS — Z79899 Other long term (current) drug therapy: Secondary | ICD-10-CM | POA: Diagnosis not present

## 2018-02-24 DIAGNOSIS — E8881 Metabolic syndrome: Secondary | ICD-10-CM

## 2018-02-24 DIAGNOSIS — T50905A Adverse effect of unspecified drugs, medicaments and biological substances, initial encounter: Secondary | ICD-10-CM | POA: Diagnosis not present

## 2018-02-24 LAB — CBC WITH DIFFERENTIAL/PLATELET
Basophils Absolute: 225 cells/uL — ABNORMAL HIGH (ref 0–200)
Basophils Relative: 2.3 %
EOS PCT: 6.4 %
Eosinophils Absolute: 627 cells/uL — ABNORMAL HIGH (ref 15–500)
HEMATOCRIT: 34.3 % — AB (ref 35.0–45.0)
HEMOGLOBIN: 12.1 g/dL (ref 11.7–15.5)
LYMPHS ABS: 2038 {cells}/uL (ref 850–3900)
MCH: 39.3 pg — ABNORMAL HIGH (ref 27.0–33.0)
MCHC: 35.3 g/dL (ref 32.0–36.0)
MCV: 111.4 fL — ABNORMAL HIGH (ref 80.0–100.0)
MPV: 9.6 fL (ref 7.5–12.5)
Monocytes Relative: 24.1 %
NEUTROS ABS: 4547 {cells}/uL (ref 1500–7800)
NEUTROS PCT: 46.4 %
Platelets: 180 10*3/uL (ref 140–400)
RBC: 3.08 10*6/uL — AB (ref 3.80–5.10)
RDW: 13.5 % (ref 11.0–15.0)
Total Lymphocyte: 20.8 %
WBC mixed population: 2362 cells/uL — ABNORMAL HIGH (ref 200–950)
WBC: 9.8 10*3/uL (ref 3.8–10.8)

## 2018-02-24 LAB — COMPLETE METABOLIC PANEL WITH GFR
AG Ratio: 1.1 (calc) (ref 1.0–2.5)
ALT: 60 U/L — ABNORMAL HIGH (ref 6–29)
AST: 95 U/L — AB (ref 10–35)
Albumin: 3.4 g/dL — ABNORMAL LOW (ref 3.6–5.1)
Alkaline phosphatase (APISO): 110 U/L (ref 33–130)
BUN: 11 mg/dL (ref 7–25)
CALCIUM: 8.8 mg/dL (ref 8.6–10.4)
CO2: 37 mmol/L — AB (ref 20–32)
CREATININE: 0.85 mg/dL (ref 0.50–0.99)
Chloride: 90 mmol/L — ABNORMAL LOW (ref 98–110)
GFR, EST NON AFRICAN AMERICAN: 74 mL/min/{1.73_m2} (ref >=60)
GFR, Est African American: 86 mL/min/{1.73_m2} (ref >=60)
GLOBULIN: 3 g/dL (ref 1.9–3.7)
Glucose, Bld: 272 mg/dL — ABNORMAL HIGH (ref 65–99)
Potassium: 3.9 mmol/L (ref 3.5–5.3)
SODIUM: 133 mmol/L — AB (ref 135–146)
Total Bilirubin: 1.5 mg/dL — ABNORMAL HIGH (ref 0.2–1.2)
Total Protein: 6.4 g/dL (ref 6.1–8.1)

## 2018-02-24 MED FILL — ONDANSETRON HCL 8 MG TABLET: 8 | 30 days supply | Qty: 90 | Fill #0

## 2018-02-24 MED FILL — ACCU-CHEK SOFTCLIX LANCETS: 25 days supply | Qty: 100 | Fill #0

## 2018-02-24 MED FILL — ACCU-CHEK AVIVA PLUS TEST S: 25 days supply | Qty: 100 | Fill #0

## 2018-02-24 MED FILL — LORazepam 2 MG TABS: 2 | 30 days supply | Qty: 30 | Fill #0

## 2018-02-24 NOTE — Progress Notes (Signed)
Subjective:    Patient ID: Suzanne [name], female    DOB: [birth year], 62 y.o.   MRN: [id number]  HPI     This delightful 89 MWF with metastatic Breast Cancer was recently started on Piqray (02/01/18) with anticipated hyperglycemia and subsequent intolerance to immediate release Metformin requires initiating insulin with first adding Novolin 70/30, then adding Lantus. By 7/5 and 7/6 , she had required total daily insulin doses of 350 and 500 units/day and Piqray was held to allow control & stabilization of blood sugars. Insulin requirement dropped and by 7/9 and7/10, she was down to 80 units daily total.      Piqray was restarted on 7/13 /19 at 1 tablet (150 mg), and she received 45 units that day and 75 units the next day (Sunday). She returns today (day 3 Piqray) for f/u with  FBG 148 mg% this am. CBG = 274 mg % and patient administered Nov 70/30 x 30 units now. 6 pm Glu 347 mg% - given 20 u  Nov 70/30  & 10 pm glu 419 mg% - given Lantus 60 u and 20 u Nov 70/30 (total 140 units/today).  Medication Sig  . albuterol (PROAIR HFA) 108 (90 Base) MCG/ACT inhaler INHALE 1 PUFF BY MOUTH EVERY 6 HOURS AS NEEDED FOR WHEEZING OR SHORTNESS OF BREATH  . albuterol   nebulizer solution INHALE 1 VIAL VIA NEB EVERY 6 HRS AS NEEDED   . Alpelisib / PIQRAY 300MG  DAILY DOSE = 2 x150 MG  Take 300 mg by mouth daily. Take with food at approximately the same time each day. (Patient not taking: Reported on 02/17/2018)  . bisoprolol  5 MG tablet TAKE 1 TABLET BY MOUTH ONCE DAILY  . calcium carbonate 500 Ca MG  Take 1 tablet by mouth.  . Celecoxib 200 MG capsule TAKE 1 CAP 2 (TWO) TIMES DAILY AS NEEDED.  Marland Kitchen VITAMIN D 1000 units tablet Take 1,000 Units by mouth daily.  . fentaNYL  25 MCG/HR patch Place 1 patch (25 mcg total) onto the skin every 3 (three) days.  Marland Kitchen FLONASE  nasal spray Place 2 sprays into both nostrils daily.  . TRELEGY ELLIPTA 100-62.5-25  Inhale 1 puff into the lungs daily.  Marland Kitchen DILAUDID 2 MG tablet Take 1 tablet   every 4 hours as needed for severe pain.  Marland Kitchen ibuprofen ( 200 MG tablet Take 2 every 6  hours as needed.  Marland Kitchen LANTUS 100 UNIT/ML injec Inject up to 50 units once daily as directed.  Marland Kitchen NOVOLIN 70/30 Take 75 units 2 x/day or as directed  . letrozole (FEMARA) 2.5 MG tablet TAKE 1 TAB ONCE DAILY  . loratadine  10 MG tablet Take 1 tablet  daily.  Marland Kitchen LORazepam (2 MG tablet TAKE 1/2 TO 1 TAB EACH NIGHT AT BEDTIME  . metFORMIN-XR 500 MG  Take 2 tab 2 x/ day for Diabetes (not taking -Reported 02/17/2018)  . ondansetron  8 MG tablet Take 1 tablet 3 x /day for Nausea  . Prochlorperazine 10 MG tablet Take 1 tablet  every 6 (six) hours as needed for nausea or vomiting.  . promethazine  25 MG tablet Take 1 tablet  every 6  hours as needed for nausea.  Marland Kitchen tiZANidine 4 MG tablet Take 1 tablet 2  times daily as needed for muscle spasms.   Allergies  Allergen Reactions  . Ace Inhibitors     cough   Past Medical History:  Diagnosis Date  . Alcoholism ([place])   .  Cancer [facility])    breast ca - 1994; recurred in 2007. s/p masectomy with flap rconstruction aand chemo '94. local recurrence on chest wall. chemo and XRT '07. now femara  . Depression   . Dyspnea    myoview 2011: EF 55% question of  mild reverible anterior defect. thought to be breast attenuation. echo 45-50% with global HK. Grade 1 diastolyic dysfunction. RV nml. cardiac MRI with EF 44% with septal HK. no scar   . History of coronary artery stent placement   . Hyperlipidemia   . Hypertension   . Insomnia   . Lung disorder    Past Surgical History:  Procedure Laterality Date  . CARDIAC CATHETERIZATION     [facility]  . MASTECTOMY    . ORIF DISTAL RADIUS FRACTURE    . PORT-A-CATH REMOVAL  07/29/2012   Procedure: REMOVAL PORT-A-CATH;  Surgeon: Haywood Lasso, MD;  Location: [facility];  Service: General;  Laterality: Left;  . R masectomy  '94   . R transflap  '94   Review of Systems     Objective:   Physical Exam  BP  118/76   Pulse 84   Temp (!) 97.3 F (36.3 C)   Resp 16   Ht 5\' 5"  (1.651 m)   Wt 177 lb (80.3 kg)   BMI 29.45 kg/m   HEENT - WNL. Neck - supple.  Chest - Clear equal BS. Cor - Nl HS. RRR w/o sig MGR. PP 1(+). No edema. MS- FROM w/o deformities.  Gait Nl. Neuro -  Nl w/o focal abnormalities.    Assessment & Plan:   1. Hyperglycemia, drug-induced  - CBC with Differential/Platelet - COMPLETE METABOLIC PANEL WITH GFR  2. Insulin resistance  - CBC with Differential/Platelet - COMPLETE METABOLIC PANEL WITH GFR  3. Metastatic breast carcinoma ([place])  4. Medication management  - CBC with Differential/Platelet - COMPLETE METABOLIC PANEL WITH GFR  - Continue daily SS insulin coverage.

## 2018-02-24 NOTE — Patient Instructions (Addendum)
(02/08/2018) - FBG 357 mg% ->  Nov 70/30  15 u  6:30 pm (02/08/2018) - CBG 482 mg% ->      Nov 70/30 30 u 11:00 pm (02/08/2018) - CBG 486 mg% ->Lantus 20 u   ____65 u +++++++++++++++++++++++++++++++++++++++++++++++++++++++++++++++++++++++++ 8:30 am (02/09/2018) - FBG 331 mg% ->  Nov 70/30 45 u  6:30 pm (02/09/2018) - CBG 410 mg% ->  Nov 70/30  50 u 11:00pm (02/09/2018) -CBG426 mg% -> Lantus 20 u+    Nov 70/30  20 u  ____ 135 u  ++++++++++++++++++++++++++++++++++++++++++++++++++++++++++++++++++++++++++ 9:00 am (02/11/2018) - FBG 236mg % ->  Nov 70/30 20u 6:30 pm (02/11/2018) - CBG 329mg % ->  Nov 70/30  20u  11:00 pm (02/11/2018) - CBG428 mg% -> ->      Nov 70/30  60 u  ____100 u  ++++++++++++++++++++++++++++++++++++++++++++++++++++++++++++++++++++++++++ 9:30 am (02/12/2018) - FBG 332  mg% -> Lantus 20 u +      Nov 70/30 60 u  4:00 pm (02/12/2018) - CBG  354 mg% -> 7:00 pm (02/12/2018) - CBG 254 mg% ->  Nov 70/30  20 u  11:00 pm (02/12/2018) - CBG199mg %-> Lantus 60 u  ____ 160 u +++++++++++++++++++++++++++++++++++++++++++++++++++++++++++++++++++++++++++ 9:00 am (02/13/2018) - FBG  501mg % ->  Nov 70/30  60u 3:30pm (02/13/2018) -  10:00 pm(02/13/2018) - CBG  556 mg% -> Lantus 80u +     Nov 70/30   60u  ____200 u +++++++++++++++++++++++++++++++++++++++++++++++++++++++++++++++++++++++++++ 8:30 am (02/14/2018) - FBG 377 mg% ->  Nov 70/30  70u 12:00(02/14/2018)- CBG 451 mg%   Nov 70/30  80 u 6:00 pm (02/14/2018) - CBG 452 mg% ->  Nov 70/30  80 u 9:00 pm (02/14/2018) - CBG 428mg % -> Lantus 120 u ____350 u TOTAL for Friday   +++++++++++++++++++++++++++++++++++++++++++++++++++++++++++++++++++++++++++ 8:00 am (02/15/2018) - FBG 288mg % ->  Nov 70/30  80 u 3:00 pm (02/15/2018) - CBG 436mg % ->  Nov 70/30  100u 7:00 pm (02/15/2018) - CBG 443 mg% ->  Nov 70/30  100 u  10:30 pm (02/15/2018) - CBG 401 mg% -> Lantus 120 u +  Nov 70/30  100 u  ____500 uTOTAL for Saturday +++++++++++++++++++++++++++++++++++++++++++++++++++++++++++++++++++++++++++ NO PIqrAY  8:00 am (02/16/2018) - FBG 232 mg% ->   Nov 70/30  80 u 3:00 pm (02/16/2018) - CBG 313 mg% ->    Nov 70/30  80 u  7:00 pm (02/16/2018) - CBG 184 mg% ->     Nov 70/30 20u  10:00 pm (02/16/2018) - CBG 243 mg% -> No Lantus  Nov 70/30 40 u ____ 220 u TOTAL for Sunday ++++++++++++++++++++++++++++++++++++++++++++++++++++++++++++++++++++++++++++ 10:00 am (02/17/2018) - FBG 72 mg% ->  No Insulin  12:00 pm (02/17/2018) - CBG  202 mg% ->   No Insulin   4:00 pm (02/17/2018) - CBG  299 mg% ->at office visit -     Nov 70/30  30 u At 5:30 pm 11:00 PM (02/17/2018) - CBG  221 mg%    Nov 70/30  15 u At 11:15 pm ____ 45 units TOTAL for Mon  +++++++++++++++++++++++++++++++++++++++++++++++++++++++++++++++++++++++++++++ 8:30Am (02/18/2018) - FBG147 mg% ->    Nov 70/30 15 u  2:00 Pm (02/18/2018) - CBG 215mg % ->   Nov 70/30 15 u 7:00 Pm (02/18/2018) - CBG 194 mg% ->    Nov 70/30 30 u 10:00 Pm (02/18/2018) - CBG 145 mg% ->Lantus 20 u    ____ 80Units TOTAL for Tues +++++++++++++++++++++++++++++++++++++++++++++++++++++++++++++++++++++++++++++++ 8:00 am (02/19/2018) - FBG230Mg % ->    Nov 70/3035 u 2:00 Pm (02/19/2018) - CBG 98 Mg% ->   No Insulin 7:00  Pm (02/19/2018) - CBG183Mg % ->   Nov 70/3020 u 11:00 Pm (02/19/2018) - CBG139Mg % ->Lantus 25  u  ____  80 Units TOTAL for Wed ++++++++++++++++++++++++++++++++++++++++++++++++++++++++++++++++++++++++++++++++ 8:00 AM(02/20/2018) - FBG122Mg % -> Nov 70/3020u 1:00PM(02/20/2018) - CBG 71Mg % -> No Insulin 6:00 PM (02/20/2018) - CBG103Mg % -> No Insulin 10:00 PM (02/20/2018) - CBG154Mg % ->Lantus 20 u  ____40Units totalfor Thurs ++++++++++++++++++++++++++++++++++++++++++++++++++++++++++++++++++++++++++++++++ Restart Piqray 150 mg - 1 tablet  8:00  AM (02/21/2018) - FBG  176Mg %  ->   Nov 70/30   15u  2:30PM(02/21/2018) - CBG 149 Mg% ->     Episode of N/V 2 hr after took [place] &Metformin at 2:30 pm  6:30 PM (02/21/2018) - CBG  261Mg %  ->    Nov 70/30  15u 10:00 PM (02/21/2018) - CBG 236Mg % ->Lantus25 u   ____ 55Units  totalfor Fri +++++++++++++++++++++++++++++++++++++++++++++++++++++++++++++++++++++++++++++++  Piqray 150 mg 1 tablet         (day  2)(Metformin on hold -> ) 8:00  AM  (02/22/2018)  -  FBG  145Mg % ->    Nov 70/30   10u  2:30 PM(02/22/2018) - CBG 112 Mg% ->      6:30 PM (02/22/2018)  -  CBG 213 Mg% ->  Nov 70/30  10u 10:00  PM  (02/22/2018)  -  CBG 235Mg % ->Lantus 25u   ____  45Units total for Sat ++++++++++++++++++++++++++++++++++++++++++++++++++++++++++++++++++++++++++++++++  Piqray 150 mg - 1 tablet        (day  3)  8:00  AM   (02/23/2018)  -   FBG     171Mg % ->   Nov 70/30    10u  2:00 PM (02/23/2018) - CBG     207Mg % ->    Nov 70/30   10  u  6:00 PM  (02/23/2018)  -  CBG     191Mg % ->   Nov 70/30   10  u 10:00  PM   (02/23/2018)  -  CBG     245 Mg% ->Lantus35 uNov 70/30  10 u _____  75Units total  forSun +++++++++++++++++++++++++++++++++++++++++++++++++++++++++++++++++++++++++++++++++  Piqray 150 mg - 1 tablet       (day  4) 8:00  AM     (02/24/2018)   -   FBG   148 Mg% ->          2:30 PM  (02/24/2018)  -   CBG     274  Mg% ->    Nov 70/30      30 u  6:00 PM   (02/24/2018)   -    CBG   347 Mg% ->   Nov 70/30      30 u 10:00  PM    (02/24/2018)  -      CBG    419   Mg% ->Lantus  60  u   Nov 70/30       20 u _____   140 Units total forMon +++++++++++++++++++++++++++++++++++++++++++++++++++++++++++++++++++++++++++++++++  Piqray 150 mg - 1 tablet       (day  5) 8:00  AM     (02/25/2018)   -     FBG          Mg% ->  Nov 70/30      u  2:30 PM  (02/25/2018)   -   CBG              Mg% ->   Nov 70/30      u  6:00 PM   (02/25/2018)    -  CBG           Mg% ->  Nov 70/30       u 10:00  PM    (02/25/2018)    -     CBG             Mg% ->Lantus     u Nov 70/30    u _____     Units total forTues

## 2018-02-25 ENCOUNTER — Other Ambulatory Visit: Payer: Self-pay | Admitting: Internal Medicine

## 2018-02-25 NOTE — Progress Notes (Addendum)
(02/08/2018) - FBG 357 mg% ->  Nov 70/30  15 u  6:30 pm (02/08/2018) - CBG 482 mg% ->  Nov 70/30 30 u 11:00 pm (02/08/2018) - CBG 486 mg% ->Lantus 20 u   ____65 u +++++++++++++++++++++++++++++++++++++++++++++++++++++++++++++++++++++++++ 8:30 am (02/09/2018) - FBG 331 mg% ->  Nov 70/30 45 u  6:30 pm (02/09/2018) - CBG 410 mg% ->  Nov 70/30  50 u 11:00pm (02/09/2018) -CBG426 mg% -> Lantus 20 u+ Nov 70/30  20 u  ____ 135 u  ++++++++++++++++++++++++++++++++++++++++++++++++++++++++++++++++++++++++++ 9:00 am (02/11/2018) - FBG 236mg % ->  Nov 70/30 20u 6:30 pm (02/11/2018) - CBG 329mg % ->  Nov 70/30  20u  11:00 pm (02/11/2018) - CBG428 mg% -> ->  Nov 70/30  60 u  ____100 u  ++++++++++++++++++++++++++++++++++++++++++++++++++++++++++++++++++++++++++ 9:30 am (02/12/2018) - FBG 332  mg% -> Lantus 20 u +    Nov 70/30 60 u  4:00 pm (02/12/2018) - CBG  354 mg% -> 7:00 pm (02/12/2018) - CBG 254 mg% ->  Nov 70/30  20 u  11:00 pm (02/12/2018) - CBG199mg %-> Lantus 60 u  ____ 160 u +++++++++++++++++++++++++++++++++++++++++++++++++++++++++++++++++++++++++++ 9:00 am (02/13/2018) - FBG  501mg % ->  Nov 70/30  60u 3:30pm (02/13/2018) -  10:00 pm(02/13/2018) - CBG  556 mg% -> Lantus 80u +  Nov 70/30   60u  ____200 u +++++++++++++++++++++++++++++++++++++++++++++++++++++++++++++++++++++++++++ 8:30 am (02/14/2018) - FBG 377 mg% ->  Nov 70/30  70u 12:00(02/14/2018)- CBG 451 mg%   Nov 70/30  80 u 6:00 pm (02/14/2018) - CBG 452 mg% ->  Nov 70/30  80 u 9:00 pm (02/14/2018) - CBG 428mg % -> Lantus 120 u ____350 u TOTAL for Friday   +++++++++++++++++++++++++++++++++++++++++++++++++++++++++++++++++++++++++++ 8:00 am (02/15/2018) - FBG 288mg % ->  Nov 70/30  80 u 3:00 pm (02/15/2018) - CBG 436mg % ->  Nov 70/30  100u 7:00 pm (02/15/2018) - CBG 443 mg% ->  Nov 70/30  100 u  10:30 pm (02/15/2018) - CBG 401 mg% -> Lantus 120 u +  Nov 70/30  100 u  ____500 uTOTAL for Saturday +++++++++++++++++++++++++++++++++++++++++++++++++++++++++++++++++++++++++++ NO PIqrAY  8:00 am (02/16/2018) - FBG 232 mg% ->   Nov 70/30  80 u 3:00 pm (02/16/2018) - CBG 313 mg% ->   Nov 70/30  80 u  7:00 pm (02/16/2018) - CBG 184 mg% ->   Nov 70/30 20u  10:00 pm (02/16/2018) - CBG 243 mg% -> No Lantus  Nov 70/30 40 u ____ 220 u TOTAL for Sunday ++++++++++++++++++++++++++++++++++++++++++++++++++++++++++++++++++++++++++++ 10:00 am (02/17/2018) - FBG 72 mg% ->  No Insulin  12:00 pm (02/17/2018) - CBG  202 mg% ->   No Insulin   4:00 pm (02/17/2018) - CBG  299 mg% ->at office visit -    Nov 70/30  30 u At 5:30 pm 11:00 PM (02/17/2018) - CBG  221 mg%   Nov 70/30  15 u At 11:15 pm ____ 45 units TOTAL for Mon  +++++++++++++++++++++++++++++++++++++++++++++++++++++++++++++++++++++++++++++ 8:30Am (02/18/2018) - FBG147 mg% ->  Nov 70/30 15 u  2:00 Pm (02/18/2018) - CBG 215mg % -> Nov 70/30 15 u 7:00 Pm (02/18/2018) - CBG 194 mg% -> Nov 70/30 30 u 10:00 Pm (02/18/2018) - CBG 145 mg% ->Lantus 20 u    ____ 80Units TOTAL for Tues +++++++++++++++++++++++++++++++++++++++++++++++++++++++++++++++++++++++++++++++ 8:00 am (02/19/2018) - FBG230Mg % ->  Nov 70/3035 u 2:00 Pm (02/19/2018) - CBG 98 Mg% ->  No Insulin 7:00 Pm (02/19/2018) - CBG183Mg % ->  Nov 70/3020 u 11:00 Pm (02/19/2018) - CBG139Mg % ->Lantus 25  u  ____  80 Units TOTAL for Wed ++++++++++++++++++++++++++++++++++++++++++++++++++++++++++++++++++++++++++++++++ 8:00 AM(02/20/2018) - FBG122Mg % ->  Nov 70/3020u 1:00PM(02/20/2018) - CBG 71Mg % -> No Insulin 6:00 PM (02/20/2018) - CBG103Mg % -> No Insulin 10:00 PM (02/20/2018) - CBG154Mg % ->Lantus 20 u  ____40Units totalfor Thurs ++++++++++++++++++++++++++++++++++++++++++++++++++++++++++++++++++++++++++++++++ Restart Piqray 150 mg - 1 tablet  8:00  AM (02/21/2018) - FBG  176Mg %  ->   Nov 70/30   15u  2:30PM(02/21/2018) - CBG 149 Mg% ->     Episode of N/V 2 hr after took [place] &Metformin at 2:30 pm  6:30 PM (02/21/2018) - CBG  261Mg %  ->    Nov 70/30  15u 10:00 PM (02/21/2018) - CBG 236Mg % ->Lantus25 u   ____ 55Units  totalfor Fri +++++++++++++++++++++++++++++++++++++++++++++++++++++++++++++++++++++++++++++++  Piqray 150 mg 1 tablet (day  2)(Metformin on hold -> ) 8:00  AM  (02/22/2018)  -  FBG  145Mg % ->    Nov 70/30   10u  2:30 PM(02/22/2018) - CBG 112 Mg% ->      6:30 PM (02/22/2018)  -  CBG 213 Mg% ->  Nov 70/30  10u 10:00 PM  (02/22/2018)  -  CBG 235Mg % ->Lantus 25u  ____  45Units total for Sat ++++++++++++++++++++++++++++++++++++++++++++++++++++++++++++++++++++++++++++++++  Piqray 150 mg - 1 tablet (day  3) 8:00  AM   (02/23/2018)  -   FBG  171Mg % -> Nov 70/30    10u  2:00 PM (02/23/2018) - CBG  207Mg % ->    Nov 70/30   10u  6:00 PM  (02/23/2018)  -  CBG  191Mg % ->   Nov 70/30   10u 10:00 PM  (02/23/2018)  -  CBG  245Mg % ->Lantus35 uNov 70/30  10u _____  75Units total  forSun +++++++++++++++++++++++++++++++++++++++++++++++++++++++++++++++++++++++++++++++++  Piqray 150 mg - 1 tablet (day  4) 8:00  AM     (02/24/2018)  -   FBG  148 Mg% ->     2:30 PM  (02/24/2018)  -   CBG  274Mg % ->    Nov 70/30      30u  6:00 PM   (02/24/2018)   -    CBG  347Mg % ->   Nov 70/30      30u 10:00 PM   (02/24/2018)  -      CBG  419 Mg% ->Lantus  60 u   Nov 70/30       20u _____ 140 Units total forMon +++++++++++++++++++++++++++++++++++++++++++++++++++++++++++++++++++++++++++++++++  Piqray 150 mg - 1 tablet (day  5) 8:00   AM     (02/25/2018)  -    FBG  169    Mg% ->      Nov 70/30   30u  2:30  PM  (02/25/2018)   -    CBG  176   Mg% ->        Nov 70/30  30 u  6:00  PM   (02/25/2018)    -   CBG      251 Mg% ->       Nov 70/30 30 u 10:00  PM   (02/25/2018)    -   CBG  286   Mg% ->      Lantus 80 u   Nov 70/30  30  u  _____ 200    Units total forTues

## 2018-02-26 ENCOUNTER — Other Ambulatory Visit: Payer: Self-pay | Admitting: Internal Medicine

## 2018-02-26 NOTE — Progress Notes (Signed)
(02/08/2018) - FBG 357 mg% ->  Nov 70/30  15 u  6:30 pm (02/08/2018) - CBG 482 mg% ->  Nov 70/30 30 u 11:00 pm (02/08/2018) - CBG 486 mg% ->Lantus 20 u   ____65 u +++++++++++++++++++++++++++++++++++++++++++++++++++++++++++++++++++++++++ 8:30 am (02/09/2018) - FBG 331 mg% ->  Nov 70/30 45 u  6:30 pm (02/09/2018) - CBG 410 mg% ->  Nov 70/30  50 u 11:00pm (02/09/2018) -CBG426 mg% -> Lantus 20 u+ Nov 70/30  20 u  ____ 135 u  ++++++++++++++++++++++++++++++++++++++++++++++++++++++++++++++++++++++++++ 9:00 am (02/11/2018) - FBG 236mg % ->  Nov 70/30 20u 6:30 pm (02/11/2018) - CBG 329mg % ->  Nov 70/30  20u  11:00 pm (02/11/2018) - CBG428 mg% -> ->  Nov 70/30  60 u  ____100 u  ++++++++++++++++++++++++++++++++++++++++++++++++++++++++++++++++++++++++++ 9:30 am (02/12/2018) - FBG 332  mg% -> Lantus 20 u +    Nov 70/30 60 u  4:00 pm (02/12/2018) - CBG  354 mg% -> 7:00 pm (02/12/2018) - CBG 254 mg% ->  Nov 70/30  20 u  11:00 pm (02/12/2018) - CBG199mg %-> Lantus 60 u  ____ 160 u +++++++++++++++++++++++++++++++++++++++++++++++++++++++++++++++++++++++++++ 9:00 am (02/13/2018) - FBG  501mg % ->  Nov 70/30  60u 3:30pm (02/13/2018) -  10:00 pm(02/13/2018) - CBG  556 mg% -> Lantus 80u +  Nov 70/30   60u  ____200 u +++++++++++++++++++++++++++++++++++++++++++++++++++++++++++++++++++++++++++ 8:30 am (02/14/2018) - FBG 377 mg% ->  Nov 70/30  70u 12:00(02/14/2018)- CBG 451 mg%   Nov 70/30  80 u 6:00 pm (02/14/2018) - CBG 452 mg% ->  Nov 70/30  80 u 9:00 pm (02/14/2018) - CBG 428mg % -> Lantus 120 u ____350 u TOTAL for Friday  +++++++++++++++++++++++++++++++++++++++++++++++++++++++++++++++++++++++++++ 8:00 am (02/15/2018)  - FBG 288mg % ->  Nov 70/30  80 u 3:00 pm (02/15/2018) - CBG 436mg % ->  Nov 70/30  100u 7:00 pm (02/15/2018) - CBG 443 mg% ->  Nov 70/30  100 u  10:30 pm (02/15/2018) - CBG 401 mg% -> Lantus 120 u +  Nov 70/30  100 u                    ____500 uTOTAL for Saturday +++++++++++++++++++++++++++++++++++++++++++++++++++++++++++++++++++++++++++ NO Piqray 8:00 am (02/16/2018) - FBG 232 mg% ->   Nov 70/30  80 u 3:00 pm (02/16/2018) - CBG 313 mg% ->   Nov 70/30  80 u  7:00 pm (02/16/2018) - CBG 184 mg% ->   Nov 70/30 20u  10:00 pm (02/16/2018) - CBG 243 mg% -> No Lantus  Nov 70/30 40 u               ____ 220 u TOTAL for Sunday ++++++++++++++++++++++++++++++++++++++++++++++++++++++++++++++++++++++++++++ 10:00 am (02/17/2018) - FBG 72 mg% ->  No Insulin  12:00 pm (02/17/2018) - CBG  202 mg% ->  No Insulin   4:00 pm (02/17/2018) - CBG  299  mg% ->at office visit -    Nov 70/30  30 u At 5:30 pm 11:00 PM (02/17/2018) - CBG  221 mg%   Nov 70/30  15 u At 11:15 pm               ____ 45 units TOTAL for Mon  +++++++++++++++++++++++++++++++++++++++++++++++++++++++++++++++++++++++++++++ 8:30Am (02/18/2018) - FBG147 mg% ->  Nov 70/30 15 u  2:00 Pm (02/18/2018) - CBG 215mg % -> Nov 70/30 15 u 7:00 Pm (02/18/2018) - CBG 194 mg% -> Nov 70/30 30 u 10:00 Pm (02/18/2018) - CBG 145 mg% ->Lantus 20 u  ____ 80Units TOTAL for Tues +++++++++++++++++++++++++++++++++++++++++++++++++++++++++++++++++++++++++++++++ 8:00 am (02/19/2018) - FBG230Mg % ->  Nov 70/3035 u 2:00 Pm (02/19/2018) - CBG 98 Mg% ->  No Insulin 7:00 Pm (02/19/2018) - CBG183Mg % ->  Nov 70/3020 u 11:00 Pm (02/19/2018) - CBG139Mg % ->Lantus 25 u               ____  80 Units TOTAL for Wed ++++++++++++++++++++++++++++++++++++++++++++++++++++++++++++++++++++++++++++++++ 8:00 AM(02/20/2018) - FBG122Mg % -> Nov 70/3020u 1:00PM(02/20/2018) - CBG 71Mg % -> No Insulin 6:00 PM (02/20/2018) - CBG103Mg % -> No Insulin 10:00 PM (02/20/2018) - CBG154Mg % ->Lantus 20 u             ____40Units totalfor Thurs ++++++++++++++++++++++++++++++++++++++++++++++++++++++++++++++++++++++++++++++++ Restart Piqray 150 mg - 1 tablet  8:00  AM (02/21/2018) - FBG  176Mg %  -> Nov 70/30  15u  2:30PM(02/21/2018) - CBG 149 Mg% ->     Episode of N/V 2 hr after took [place] &Metformin at 2:30 pm  6:30 PM (02/21/2018) - CBG  261Mg %  ->  Nov 70/30  15u 10:00 PM (02/21/2018) - CBG 236Mg % ->Lantus25 u   ____ 55Units totalfor  Fri +++++++++++++++++++++++++++++++++++++++++++++++++++++++++++++++++++++++++++++++  Piqray 150 mg 1 tablet (day 2)(Metformin on hold ->) 8:00  AM  (02/22/2018)  -  FBG  145Mg % ->  Nov 70/30  10u  2:30 PM(02/22/2018) - CBG 112 Mg% ->      6:30 PM (02/22/2018)  -  CBG 213 Mg% ->  Nov 70/30  10u 10:00 PM  (02/22/2018)  -  CBG 235Mg % ->Lantus 25u    ____  45Units total for Sat ++++++++++++++++++++++++++++++++++++++++++++++++++++++++++++++++++++++++++++++++  Piqray 150 mg - 1 tablet (day3) 8:00  AM  (02/23/2018)  -  FBG  171Mg % -> Nov 70/30   10u  2:00 PM (02/23/2018) - CBG  207Mg % ->     Nov 70/30  10u  6:00 PM  (02/23/2018)  -  CBG  191Mg % ->   Nov 70/30   10u 10:00 PM  (02/23/2018)  -  CBG  245Mg % ->Lantus35 u    Nov 70/30  10u _____  75Units total  forSun +++++++++++++++++++++++++++++++++++++++++++++++++++++++++++++++++++++++++++++++++  Piqray 150 mg - 1 tablet (day4) 8:00  AM  (02/24/2018)  -  FBG  148 Mg% ->      2:30 PM (02/24/2018)  -  CBG  274Mg % ->     Nov 70/30   30u  6:00 PM  (02/24/2018)  -  CBG  347Mg % ->    Nov 70/30  30u 10:00 PM  (02/24/2018)  -  CBG  419Mg % ->Lantus60u    Nov 70/30  20u _____ 140Units total forMon +++++++++++++++++++++++++++++++++++++++++++++++++++++++++++++++++++++++++++++++++  Piqray 150 mg - 1 tablet (day5) 8:00  AM  (02/25/2018)  - FBG 169 Mg% ->    Nov 70/30  30u  2:30 PM (02/25/2018)  -  CBG  176Mg % ->      Nov 70/30   30u  6:00 PM  (02/25/2018)  -  CBG  251 Mg% ->    Nov 70/30  30u 10:00  PM  (02/25/2018)   -  CBG  286Mg % -> Lantus 80u      Nov 70/30   30 u  _____ 200Units total  forTues +++++++++++++++++++++++++++++++++++++++++++++++++++++++++++++++++++++++++++++++++  Piqray 150 mg - 1 tablet (day6) 8:00  AM  (02/26/2018)  - FBG 93  Mg%  ->  Nov 70/30 25  u 2:30 PM  (02/26/2018)  -   CBG  265 Mg% ->     Nov 70/30   40 u 6:00 PM   (02/26/2018)  -  CBG  313 Mg% ->         Nov 70/30  50  u 10:00 PM   (02/26/2018)   -   CBG  389 Mg% ->Lantus100 u          Nov 70/30  40 u _____ 255Units total forWed +++++++++++++++++++++++++++++++++++++++++++++++++++++++++++++++++++++++++++++++++  Piqray 150 mg - 1 tablet (day7) 8:00  AM  (02/27/2018)  - FBG        Mg%  ->   Nov 70/30   u 2:30 PM  (02/27/2018)  -   CBG          Mg%   ->     Nov 70/30     u 6:00 PM   (02/27/2018)  -  CBG           Mg%   ->       Nov 70/30    u 10:00 PM   (02/27/2018)   -   CBG         Mg%   ->Lantus100 u        Nov 70/30    u _____        Units total forThurs

## 2018-02-26 NOTE — Progress Notes (Signed)
(02/08/2018) - FBG 357 mg% ->  Nov 70/30  15 u  6:30 pm (02/08/2018) - CBG 482 mg% ->  Nov 70/30 30 u 11:00 pm (02/08/2018) - CBG 486 mg% ->Lantus 20 u   ____65 u +++++++++++++++++++++++++++++++++++++++++++++++++++++++++++++++++++++++++ 8:30 am (02/09/2018) - FBG 331 mg% ->  Nov 70/30 45 u  6:30 pm (02/09/2018) - CBG 410 mg% ->  Nov 70/30  50 u 11:00pm (02/09/2018) -CBG426 mg% -> Lantus 20 u+ Nov 70/30  20 u  ____ 135 u  ++++++++++++++++++++++++++++++++++++++++++++++++++++++++++++++++++++++++++ 9:00 am (02/11/2018) - FBG 236mg % ->  Nov 70/30 20u 6:30 pm (02/11/2018) - CBG 329mg % ->  Nov 70/30  20u  11:00 pm (02/11/2018) - CBG428 mg% -> ->  Nov 70/30  60 u  ____100 u  ++++++++++++++++++++++++++++++++++++++++++++++++++++++++++++++++++++++++++ 9:30 am (02/12/2018) - FBG 332  mg% -> Lantus 20 u +    Nov 70/30 60 u  4:00 pm (02/12/2018) - CBG  354 mg% -> 7:00 pm (02/12/2018) - CBG 254 mg% ->  Nov 70/30  20 u  11:00 pm (02/12/2018) - CBG199mg %-> Lantus 60 u  ____ 160 u +++++++++++++++++++++++++++++++++++++++++++++++++++++++++++++++++++++++++++ 9:00 am (02/13/2018) - FBG  501mg % ->  Nov 70/30  60u 3:30pm (02/13/2018) -  10:00 pm(02/13/2018) - CBG  556 mg% -> Lantus 80u +  Nov 70/30   60u  ____200 u +++++++++++++++++++++++++++++++++++++++++++++++++++++++++++++++++++++++++++ 8:30 am (02/14/2018) - FBG 377 mg% ->  Nov 70/30  70u 12:00(02/14/2018)- CBG 451 mg%   Nov 70/30  80 u 6:00 pm (02/14/2018) - CBG 452 mg% ->  Nov 70/30  80 u 9:00 pm (02/14/2018) - CBG 428mg % -> Lantus 120 u ____350 u TOTAL for Friday  +++++++++++++++++++++++++++++++++++++++++++++++++++++++++++++++++++++++++++ 8:00 am (02/15/2018)  - FBG 288mg % ->  Nov 70/30  80 u 3:00 pm (02/15/2018) - CBG 436mg % ->  Nov 70/30  100u 7:00 pm (02/15/2018) - CBG 443 mg% ->  Nov 70/30  100 u  10:30 pm (02/15/2018) - CBG 401 mg% -> Lantus 120 u +  Nov 70/30  100 u                    ____500 uTOTAL for Saturday +++++++++++++++++++++++++++++++++++++++++++++++++++++++++++++++++++++++++++ NO Piqray 8:00 am (02/16/2018) - FBG 232 mg% ->   Nov 70/30  80 u 3:00 pm (02/16/2018) - CBG 313 mg% ->   Nov 70/30  80 u  7:00 pm (02/16/2018) - CBG 184 mg% ->   Nov 70/30 20u  10:00 pm (02/16/2018) - CBG 243 mg% -> No Lantus  Nov 70/30 40 u               ____ 220 u TOTAL for Sunday ++++++++++++++++++++++++++++++++++++++++++++++++++++++++++++++++++++++++++++ 10:00 am (02/17/2018) - FBG 72 mg% ->  No Insulin  12:00 pm (02/17/2018) - CBG  202 mg% ->  No Insulin   4:00 pm (02/17/2018) - CBG  299  mg% ->at office visit -    Nov 70/30  30 u At 5:30 pm 11:00 PM (02/17/2018) - CBG  221 mg%   Nov 70/30  15 u At 11:15 pm               ____ 45 units TOTAL for Mon  +++++++++++++++++++++++++++++++++++++++++++++++++++++++++++++++++++++++++++++ 8:30Am (02/18/2018) - FBG147 mg% ->  Nov 70/30 15 u  2:00 Pm (02/18/2018) - CBG 215mg % -> Nov 70/30 15 u 7:00 Pm (02/18/2018) - CBG 194 mg% -> Nov 70/30 30 u 10:00 Pm (02/18/2018) - CBG 145 mg% ->Lantus 20 u  ____ 80Units TOTAL for Tues +++++++++++++++++++++++++++++++++++++++++++++++++++++++++++++++++++++++++++++++ 8:00 am (02/19/2018) - FBG230Mg % ->  Nov 70/3035 u 2:00 Pm (02/19/2018) - CBG 98 Mg% ->  No Insulin 7:00 Pm (02/19/2018) - CBG183Mg % ->  Nov 70/3020 u 11:00 Pm (02/19/2018) - CBG139Mg % ->Lantus 25 u               ____  80 Units TOTAL for Wed ++++++++++++++++++++++++++++++++++++++++++++++++++++++++++++++++++++++++++++++++ 8:00 AM(02/20/2018) - FBG122Mg % -> Nov 70/3020u 1:00PM(02/20/2018) - CBG 71Mg % -> No Insulin 6:00 PM (02/20/2018) - CBG103Mg % -> No Insulin 10:00 PM (02/20/2018) - CBG154Mg % ->Lantus 20 u             ____40Units totalfor Thurs ++++++++++++++++++++++++++++++++++++++++++++++++++++++++++++++++++++++++++++++++ Restart Piqray 150 mg - 1 tablet  8:00  AM (02/21/2018) - FBG  176Mg %  -> Nov 70/30  15u  2:30PM(02/21/2018) - CBG 149 Mg% ->     Episode of N/V 2 hr after took [place] &Metformin at 2:30 pm  6:30 PM (02/21/2018) - CBG  261Mg %  ->  Nov 70/30  15u 10:00 PM (02/21/2018) - CBG 236Mg % ->Lantus25 u   ____ 55Units totalfor  Fri +++++++++++++++++++++++++++++++++++++++++++++++++++++++++++++++++++++++++++++++  Piqray 150 mg 1 tablet (day 2)(Metformin on hold ->) 8:00  AM  (02/22/2018)  -  FBG  145Mg % ->  Nov 70/30  10u  2:30 PM(02/22/2018) - CBG 112 Mg% ->      6:30 PM (02/22/2018)  -  CBG 213 Mg% ->  Nov 70/30  10u 10:00 PM  (02/22/2018)  -  CBG 235Mg % ->Lantus 25u  ____  45Units total for Sat ++++++++++++++++++++++++++++++++++++++++++++++++++++++++++++++++++++++++++++++++  Piqray 150 mg - 1 tablet (day3) 8:00  AM  (02/23/2018)  -  FBG  171Mg % -> Nov 70/30   10u  2:00 PM (02/23/2018) - CBG  207Mg % ->    Nov 70/30  10u  6:00 PM  (02/23/2018)  -  CBG  191Mg % ->  Nov 70/30   10u 10:00 PM  (02/23/2018)  -  CBG  245Mg % ->Lantus35 uNov 70/30  10u _____  75Units total  forSun +++++++++++++++++++++++++++++++++++++++++++++++++++++++++++++++++++++++++++++++++  Piqray 150 mg - 1 tablet (day4) 8:00  AM  (02/24/2018)  -  FBG  148 Mg% ->     2:30 PM (02/24/2018)  -  CBG  274Mg % ->  Nov 70/30   30u  6:00 PM  (02/24/2018)  -  CBG  347Mg % ->  Nov 70/30  30u 10:00 PM  (02/24/2018)  -  CBG  419Mg % ->Lantus60u  Nov 70/30  20u _____ 140Units total forMon +++++++++++++++++++++++++++++++++++++++++++++++++++++++++++++++++++++++++++++++++  Piqray 150 mg - 1 tablet (day5) 8:00   AM  (02/25/2018)  - FBG 169   Mg% ->      Nov 70/30    30u  2:30  PM (02/25/2018)  -    CBG  176  Mg% ->        Nov 70/30   30u  6:00  PM  (02/25/2018)  -   CBG      251 Mg% ->       Nov 70/30  30 u 10:00  PM  (02/25/2018)   -  CBG  286   Mg% ->      Lantus 80 u      Nov 70/30   30  u  _____ 200    Units total  forTues +++++++++++++++++++++++++++++++++++++++++++++++++++++++++++++++++++++++++++++++++  Piqray 150 mg - 1 tablet (day6) 8:00   AM  (02/26/2018)  - FBG          Mg%  ->      Nov 70/30  u 2:30  PM  (02/26/2018)  -     CBG           Mg% ->        Nov 70/30       u 6:00  PM   (02/26/2018)  -    CBG            Mg% ->       Nov 70/30        u 10:00  PM   (02/26/2018)   -   CBG             Mg% ->      Lantus  u          Nov 70/30         u _____               Units total forWed

## 2018-02-27 ENCOUNTER — Other Ambulatory Visit: Payer: Self-pay | Admitting: Internal Medicine

## 2018-02-27 NOTE — Progress Notes (Signed)
(02/08/2018) - FBG 357 mg% ->  Nov 70/30  15 u  6:30 pm (02/08/2018) - CBG 482 mg% ->  Nov 70/30 30 u 11:00 pm (02/08/2018) - CBG 486 mg% ->Lantus 20 u   ____65 u +++++++++++++++++++++++++++++++++++++++++++++++++++++++++++++++++++++++++ 8:30 am (02/09/2018) - FBG 331 mg% ->  Nov 70/30 45 u  6:30 pm (02/09/2018) - CBG 410 mg% ->  Nov 70/30  50 u 11:00pm (02/09/2018) -CBG426 mg% -> Lantus 20 u+ Nov 70/30  20 u  ____ 135 u  ++++++++++++++++++++++++++++++++++++++++++++++++++++++++++++++++++++++++++ 9:00 am (02/11/2018) - FBG 236mg % ->  Nov 70/30 20u 6:30 pm (02/11/2018) - CBG 329mg % ->  Nov 70/30  20u  11:00 pm (02/11/2018) - CBG428 mg% -> ->  Nov 70/30  60 u  ____100 u  ++++++++++++++++++++++++++++++++++++++++++++++++++++++++++++++++++++++++++ 9:30 am (02/12/2018) - FBG 332  mg% -> Lantus 20 u +    Nov 70/30 60 u  4:00 pm (02/12/2018) - CBG  354 mg% -> 7:00 pm (02/12/2018) - CBG 254 mg% ->  Nov 70/30  20 u  11:00 pm (02/12/2018) - CBG199mg %-> Lantus 60 u  ____ 160 u +++++++++++++++++++++++++++++++++++++++++++++++++++++++++++++++++++++++++++ 9:00 am (02/13/2018) - FBG  501mg % ->  Nov 70/30  60u 3:30pm (02/13/2018) -  10:00 pm(02/13/2018) - CBG  556 mg% -> Lantus 80u +  Nov 70/30   60u  ____200 u +++++++++++++++++++++++++++++++++++++++++++++++++++++++++++++++++++++++++++ 8:30 am (02/14/2018) - FBG 377 mg% ->  Nov 70/30  70u 12:00(02/14/2018)- CBG 451 mg%   Nov 70/30  80 u 6:00 pm (02/14/2018) - CBG 452 mg% ->  Nov 70/30  80 u 9:00 pm (02/14/2018) - CBG 428mg % -> Lantus 120 u ____350 u TOTAL for Friday  +++++++++++++++++++++++++++++++++++++++++++++++++++++++++++++++++++++++++++ 8:00 am (02/15/2018)  - FBG 288mg % ->  Nov 70/30  80 u 3:00 pm (02/15/2018) - CBG 436mg % ->  Nov 70/30  100u 7:00 pm (02/15/2018) - CBG 443 mg% ->  Nov 70/30  100 u  10:30 pm (02/15/2018) - CBG 401 mg% -> Lantus 120 u +  Nov 70/30  100 u   ____500 uTOTAL for Saturday +++++++++++++++++++++++++++++++++++++++++++++++++++++++++++++++++++++++++++ NO Piqray 8:00 am (02/16/2018) - FBG 232 mg% ->   Nov 70/30  80 u 3:00 pm (02/16/2018) - CBG 313 mg% ->   Nov 70/30  80 u  7:00 pm (02/16/2018) - CBG 184 mg% ->   Nov 70/30 20u  10:00 pm (02/16/2018) - CBG 243 mg% -> No Lantus  Nov 70/30 40 u  ____ 220 u TOTAL for Sunday ++++++++++++++++++++++++++++++++++++++++++++++++++++++++++++++++++++++++++++ 10:00 am (02/17/2018) - FBG 72 mg% ->  No Insulin  12:00 pm (02/17/2018) - CBG  202 mg% ->  No Insulin   4:00 pm (02/17/2018) - CBG  299  mg% ->at office visit -    Nov 70/30  30 u At 5:30 pm 11:00 PM (02/17/2018) - CBG  221 mg%   Nov 70/30  15 u At 11:15 pm  ____ 45 units TOTAL for Mon  +++++++++++++++++++++++++++++++++++++++++++++++++++++++++++++++++++++++++++++ 8:30Am (02/18/2018) - FBG147 mg% ->  Nov 70/30 15 u  2:00 Pm (02/18/2018) - CBG 215mg % -> Nov 70/30 15 u 7:00 Pm (02/18/2018) - CBG 194 mg% -> Nov 70/30 30 u 10:00 Pm (02/18/2018) - CBG 145 mg% ->Lantus 20 u     ____ 80Units TOTAL for Tues +++++++++++++++++++++++++++++++++++++++++++++++++++++++++++++++++++++++++++++++ 8:00 am (02/19/2018) - FBG230Mg % ->  Nov 70/3035 u 2:00 Pm (02/19/2018) - CBG 98 Mg% ->  No Insulin 7:00 Pm (02/19/2018) - CBG183Mg % ->  Nov 70/3020 u 11:00 Pm (02/19/2018) - CBG139Mg % ->Lantus 25 u    ____  80 Units TOTAL for Wed ++++++++++++++++++++++++++++++++++++++++++++++++++++++++++++++++++++++++++++++++  8:00 AM(02/20/2018) - FBG122Mg % -> Nov 70/3020u 1:00PM(02/20/2018) - CBG 71Mg % -> No Insulin 6:00 PM (02/20/2018) - CBG103Mg % -> No Insulin 10:00 PM (02/20/2018) - CBG154Mg % ->Lantus 20 u   ____40Units totalfor Thurs ++++++++++++++++++++++++++++++++++++++++++++++++++++++++++++++++++++++++++++++++ Restart Piqray 150 mg - 1 tablet  8:00  AM (02/21/2018) - FBG  176Mg %  -> Nov 70/30  15u  2:30PM(02/21/2018) - CBG 149 Mg% ->     Episode of N/V 2 hr after took [place] &Metformin at 2:30 pm  6:30 PM (02/21/2018) - CBG  261Mg %  ->  Nov 70/30  15u 10:00 PM (02/21/2018) - CBG 236Mg % ->Lantus25 u   ____ 55Units totalfor  Fri +++++++++++++++++++++++++++++++++++++++++++++++++++++++++++++++++++++++++++++++  Piqray 150 mg 1 tablet (day 2)(Metformin on hold ->) 8:00  AM  (02/22/2018)  -  FBG  145Mg % ->  Nov 70/30  10u  2:30 PM(02/22/2018) - CBG 112 Mg% ->      6:30 PM (02/22/2018)  -  CBG 213 Mg% ->  Nov 70/30  10u 10:00 PM  (02/22/2018)  -  CBG 235Mg % ->Lantus 25u    ____  45Units total for Sat ++++++++++++++++++++++++++++++++++++++++++++++++++++++++++++++++++++++++++++++++  Piqray 150 mg - 1 tablet (day3) 8:00  AM  (02/23/2018)  -  FBG  171Mg % -> Nov 70/30   10u  2:00 PM (02/23/2018) - CBG  207Mg % ->     Nov 70/30  10u  6:00 PM  (02/23/2018)  -  CBG  191Mg % ->   Nov 70/30   10u 10:00 PM  (02/23/2018)  -  CBG  245Mg % ->Lantus35 u    Nov 70/30  10u _____  75Units total  forSun +++++++++++++++++++++++++++++++++++++++++++++++++++++++++++++++++++++++++++++++++  Piqray 150 mg - 1 tablet (day4) 8:00  AM  (02/24/2018)  -  FBG  148 Mg% ->      2:30 PM (02/24/2018)  -  CBG  274Mg % ->     Nov 70/30   30u  6:00 PM  (02/24/2018)  -  CBG  347Mg % ->    Nov 70/30  30u 10:00 PM  (02/24/2018)  -  CBG  419Mg % ->Lantus60u    Nov 70/30  20u _____ 140Units total forMon +++++++++++++++++++++++++++++++++++++++++++++++++++++++++++++++++++++++++++++++++  Piqray 150 mg - 1 tablet (day5) 8:00  AM  (02/25/2018)  - FBG 169 Mg% ->    Nov 70/30 30u  2:30 PM (02/25/2018)  -  CBG  176Mg % ->      Nov 70/30  30u  6:00 PM  (02/25/2018)  -  CBG  251 Mg% ->    Nov 70/30  30u 10:00  PM  (02/25/2018)   -  CBG  286Mg % -> Lantus 80u  Nov 70/30  30 u  _____ 200Units total  forTues +++++++++++++++++++++++++++++++++++++++++++++++++++++++++++++++++++++++++++++++++  Piqray 150 mg - 1 tablet (day6) 8:00  AM  (02/26/2018)  - FBG 93  Mg%  ->  Nov 70/30 25  u 2:30 PM  (02/26/2018)  -  CBG  265 Mg% ->     Nov 70/30   40u 6:00 PM   (02/26/2018)  - CBG  313 Mg% ->         Nov 70/30  50 u 10:00 PM   (02/26/2018)   -   CBG  389 Mg% ->   Lantus100 u   Nov 70/30  40 u _____ 255Units total forWed +++++++++++++++++++++++++++++++++++++++++++++++++++++++++++++++++++++++++++++++++  Piqray 150 mg - 1 tablet (day7) 8:00  AM  (02/27/2018)  -   FBG  293   Mg%  ->   Nov 70/30 60 u 1:30 PM  (02/27/2018)  -   CBG  117   Mg%   ->     Nov 70/30  40 u 5:30  PM   (02/27/2018)  -  CBG    81   Mg%   ->       10:00 PM   (02/27/2018)   -    CBG   92   Mg%   ->  Lantus 20  u     _____  120 Units total forThurs

## 2018-02-28 ENCOUNTER — Other Ambulatory Visit: Payer: Self-pay | Admitting: Internal Medicine

## 2018-02-28 NOTE — Progress Notes (Unsigned)
(02/08/2018) - FBG 357 mg% ->  Nov 70/30  15 u  6:30 pm (02/08/2018) - CBG 482 mg% ->  Nov 70/30 30 u 11:00 pm (02/08/2018) - CBG 486 mg% ->Lantus 20 u   ____65 u +++++++++++++++++++++++++++++++++++++++++++++++++++++++++++++++++++++++++ 8:30 am (02/09/2018) - FBG 331 mg% ->  Nov 70/30 45 u  6:30 pm (02/09/2018) - CBG 410 mg% ->  Nov 70/30  50 u 11:00pm (02/09/2018) -CBG426 mg% -> Lantus 20 u+ Nov 70/30  20 u  ____ 135 u  ++++++++++++++++++++++++++++++++++++++++++++++++++++++++++++++++++++++++++ 9:00 am (02/11/2018) - FBG 236mg % ->  Nov 70/30 20u 6:30 pm (02/11/2018) - CBG 329mg % ->  Nov 70/30  20u  11:00 pm (02/11/2018) - CBG428 mg% -> ->  Nov 70/30  60 u  ____100 u  ++++++++++++++++++++++++++++++++++++++++++++++++++++++++++++++++++++++++++ 9:30 am (02/12/2018) - FBG 332  mg% -> Lantus 20 u +    Nov 70/30 60 u  4:00 pm (02/12/2018) - CBG  354 mg% -> 7:00 pm (02/12/2018) - CBG 254 mg% ->  Nov 70/30  20 u  11:00 pm (02/12/2018) - CBG199mg %-> Lantus 60 u  ____ 160 u +++++++++++++++++++++++++++++++++++++++++++++++++++++++++++++++++++++++++++ 9:00 am (02/13/2018) - FBG  501mg % ->  Nov 70/30  60u 3:30pm (02/13/2018) -  10:00 pm(02/13/2018) - CBG  556 mg% -> Lantus 80u +  Nov 70/30   60u  ____200 u +++++++++++++++++++++++++++++++++++++++++++++++++++++++++++++++++++++++++++ 8:30 am (02/14/2018) - FBG 377 mg% ->  Nov 70/30  70u 12:00(02/14/2018)- CBG 451 mg%   Nov 70/30  80 u 6:00 pm (02/14/2018) - CBG 452 mg% ->  Nov 70/30  80 u 9:00 pm (02/14/2018) - CBG 428mg % -> Lantus 120 u ____350 u TOTAL for Friday  +++++++++++++++++++++++++++++++++++++++++++++++++++++++++++++++++++++++++++ 8:00 am (02/15/2018)  - FBG 288mg % ->  Nov 70/30  80 u 3:00 pm (02/15/2018) - CBG 436mg % ->  Nov 70/30  100u 7:00 pm (02/15/2018) - CBG 443 mg% ->  Nov 70/30  100 u  10:30 pm (02/15/2018) - CBG 401 mg% -> Lantus 120 u +  Nov 70/30  100 u   ____500 uTOTAL for Saturday +++++++++++++++++++++++++++++++++++++++++++++++++++++++++++++++++++++++++++ NO Piqray 8:00 am (02/16/2018) - FBG 232 mg% ->   Nov 70/30  80 u 3:00 pm (02/16/2018) - CBG 313 mg% ->   Nov 70/30  80 u  7:00 pm (02/16/2018) - CBG 184 mg% ->   Nov 70/30 20u  10:00 pm (02/16/2018) - CBG 243 mg% -> No Lantus  Nov 70/30 40 u  ____ 220 u TOTAL for Sunday ++++++++++++++++++++++++++++++++++++++++++++++++++++++++++++++++++++++++++++ 10:00 am (02/17/2018) - FBG 72 mg% ->  No Insulin  12:00 pm (02/17/2018) - CBG  202 mg% ->  No Insulin   4:00 pm (02/17/2018) - CBG  299  mg% ->at office visit -    Nov 70/30  30 u At 5:30 pm 11:00 PM (02/17/2018) - CBG  221 mg%   Nov 70/30  15 u At 11:15 pm  ____ 45 units TOTAL for Mon  +++++++++++++++++++++++++++++++++++++++++++++++++++++++++++++++++++++++++++++ 8:30Am (02/18/2018) - FBG147 mg% ->  Nov 70/30 15 u  2:00 Pm (02/18/2018) - CBG 215mg % -> Nov 70/30 15 u 7:00 Pm (02/18/2018) - CBG 194 mg% -> Nov 70/30 30 u 10:00 Pm (02/18/2018) - CBG 145 mg% ->Lantus 20 u     ____ 80Units TOTAL for Tues +++++++++++++++++++++++++++++++++++++++++++++++++++++++++++++++++++++++++++++++ 8:00 am (02/19/2018) - FBG230Mg % ->  Nov 70/3035 u 2:00 Pm (02/19/2018) - CBG 98 Mg% ->  No Insulin 7:00 Pm (02/19/2018) - CBG183Mg % ->  Nov 70/3020 u 11:00 Pm (02/19/2018) - CBG139Mg % ->Lantus 25 u    ____  80 Units TOTAL for Wed ++++++++++++++++++++++++++++++++++++++++++++++++++++++++++++++++++++++++++++++++  8:00 AM(02/20/2018) - FBG122Mg % -> Nov 70/3020u 1:00PM(02/20/2018) - CBG 71Mg % -> No Insulin 6:00 PM (02/20/2018) - CBG103Mg % -> No Insulin 10:00 PM (02/20/2018) - CBG154Mg % ->Lantus 20 u   ____40Units totalfor Thurs ++++++++++++++++++++++++++++++++++++++++++++++++++++++++++++++++++++++++++++++++ Restart Piqray 150 mg - 1 tablet  8:00  AM (02/21/2018) - FBG  176Mg %  -> Nov 70/30  15u  2:30PM(02/21/2018) - CBG 149 Mg% ->     Episode of N/V 2 hr after took [place] &Metformin at 2:30 pm  6:30 PM (02/21/2018) - CBG  261Mg %  ->  Nov 70/30  15u 10:00 PM (02/21/2018) - CBG 236Mg % ->Lantus25 u   ____ 55Units totalfor  Fri +++++++++++++++++++++++++++++++++++++++++++++++++++++++++++++++++++++++++++++++  Piqray 150 mg 1 tablet (day 2)(Metformin on hold ->) 8:00  AM  (02/22/2018)  -  FBG  145Mg % ->  Nov 70/30  10u  2:30 PM(02/22/2018) - CBG 112 Mg% ->      6:30 PM (02/22/2018)  -  CBG 213 Mg% ->  Nov 70/30  10u 10:00 PM  (02/22/2018)  -  CBG 235Mg % ->Lantus 25u   ____  45Units total for Sat ++++++++++++++++++++++++++++++++++++++++++++++++++++++++++++++++++++++++++++++++  Piqray 150 mg - 1 tablet (day3) 8:00  AM  (02/23/2018)  -  FBG  171Mg % -> Nov 70/30   10u  2:00 PM (02/23/2018) - CBG  207Mg % ->    Nov 70/30  10u  6:00 PM  (02/23/2018)  -  CBG  191Mg % ->  Nov 70/30   10u 10:00 PM  (02/23/2018)  -  CBG  245Mg % ->Lantus35 u Nov 70/30  10u _____  75Units total  forSun +++++++++++++++++++++++++++++++++++++++++++++++++++++++++++++++++++++++++++++++++  Piqray 150 mg - 1 tablet (day4) 8:00  AM  (02/24/2018)  -  FBG  148 Mg% ->       2:30 PM (02/24/2018)  -  CBG  274Mg % ->   Nov 70/30   30u  6:00 PM  (02/24/2018)  -  CBG  347Mg % ->  Nov 70/30  30u 10:00 PM  (02/24/2018)  -  CBG  419Mg % ->Lantus60u  Nov 70/30  20u _____ 140Units total forMon +++++++++++++++++++++++++++++++++++++++++++++++++++++++++++++++++++++++++++++++++  Piqray 150 mg - 1 tablet (day5) 8:00  AM  (02/25/2018)  - FBG 169 Mg% ->       Nov 70/30 30u  2:30 PM (02/25/2018)  -  CBG  176Mg % ->       Nov 70/30  30u  6:00 PM  (02/25/2018)  -  CBG  251 Mg% ->     Nov 70/30  30u 10:00  PM  (02/25/2018)   -  CBG  286Mg % -> Lantus 80u  Nov 70/30  30 u  _____ 200Units total  forTues +++++++++++++++++++++++++++++++++++++++++++++++++++++++++++++++++++++++++++++++++  Piqray 150 mg - 1 tablet (day6) 8:00  AM  (02/26/2018)  - FBG 93 Mg%  ->      Nov 70/30 25u 2:30 PM  (02/26/2018)  -  CBG  265 Mg% ->        Nov 70/30 40u 6:00 PM   (02/26/2018)  - CBG  313 Mg% ->      Nov 70/30  50u 10:00 PM   (02/26/2018)   -   CBG  389Mg % ->   Lantus100 u  Nov 70/30 40u _____ 255Units total forWed +++++++++++++++++++++++++++++++++++++++++++++++++++++++++++++++++++++++++++++++++  Piqray 150 mg - 1 tablet (day7)    8:00  AM  (02/27/2018)  -   FBG  293Mg %  ->      Nov 70/30  60 u 1:30 PM  (02/27/2018)  -   CBG   117Mg %  ->  Nov 70/30  40 u 5:30  PM   (02/27/2018)  -  CBG    81Mg %  ->       10:00 PM   (02/27/2018)   -    CBG   92Mg %  ->  Lantus 20  u     _____  120 Units total  forThurs +++++++++++++++++++++++++++++++++++++++++++++++++++++++++++++++++++++++++++++++++  Piqray 150 mg - 1 tablet (day8) 8:00  AM  (02/28/2018)  -   FBG        121    Mg%     ->     Nov 70/30  10  u 1:30 PM  (02/28/2018)  -   CBG         237   Mg%    ->       Nov 70/30  25 u 5:30  PM   (02/28/2018)  -  CBG         269 Mg%   ->        Nov 70/30  35 u 10:00 PM   (02/28/2018)   -    CBG        267 Mg%     -> Lantus 50   u        Nov 70/30  15 u   _____  135   Units total forFriday +++++++++++++++++++++++++++++++++++++++++++++++++++++++++++++++++++++++++++++++++

## 2018-03-01 ENCOUNTER — Other Ambulatory Visit: Payer: Self-pay | Admitting: Internal Medicine

## 2018-03-01 NOTE — Progress Notes (Signed)
(02/08/2018) - FBG 357 mg% ->  Nov 70/30  15 u  6:30 pm (02/08/2018) - CBG 482 mg% ->  Nov 70/30 30 u 11:00 pm (02/08/2018) - CBG 486 mg% ->Lantus 20 u   ____65 u +++++++++++++++++++++++++++++++++++++++++++++++++++++++++++++++++++++++++ 8:30 am (02/09/2018) - FBG 331 mg% ->  Nov 70/30 45 u  6:30 pm (02/09/2018) - CBG 410 mg% ->  Nov 70/30  50 u 11:00pm (02/09/2018) -CBG426 mg% -> Lantus 20 u+ Nov 70/30  20 u  ____ 135 u  ++++++++++++++++++++++++++++++++++++++++++++++++++++++++++++++++++++++++++ 9:00 am (02/11/2018) - FBG 236mg % ->  Nov 70/30 20u 6:30 pm (02/11/2018) - CBG 329mg % ->  Nov 70/30  20u  11:00 pm (02/11/2018) - CBG428 mg% -> ->  Nov 70/30  60 u  ____100 u  ++++++++++++++++++++++++++++++++++++++++++++++++++++++++++++++++++++++++++ 9:30 am (02/12/2018) - FBG 332  mg% -> Lantus 20 u +    Nov 70/30 60 u  4:00 pm (02/12/2018) - CBG  354 mg% -> 7:00 pm (02/12/2018) - CBG 254 mg% ->  Nov 70/30  20 u  11:00 pm (02/12/2018) - CBG199mg %-> Lantus 60 u  ____ 160 u +++++++++++++++++++++++++++++++++++++++++++++++++++++++++++++++++++++++++++ 9:00 am (02/13/2018) - FBG  501mg % ->  Nov 70/30  60u 3:30pm (02/13/2018) -  10:00 pm(02/13/2018) - CBG  556 mg% -> Lantus 80u +  Nov 70/30   60u  ____200 u +++++++++++++++++++++++++++++++++++++++++++++++++++++++++++++++++++++++++++ 8:30 am (02/14/2018) - FBG 377 mg% ->  Nov 70/30  70u 12:00(02/14/2018)- CBG 451 mg%   Nov 70/30  80 u 6:00 pm (02/14/2018) - CBG 452 mg% ->  Nov 70/30  80 u 9:00 pm (02/14/2018) - CBG 428mg % -> Lantus 120 u ____350 u TOTAL for Friday  +++++++++++++++++++++++++++++++++++++++++++++++++++++++++++++++++++++++++++ 8:00 am (02/15/2018)  - FBG 288mg % ->  Nov 70/30  80 u 3:00 pm (02/15/2018) - CBG 436mg % ->  Nov 70/30  100u 7:00 pm (02/15/2018) - CBG 443 mg% ->  Nov 70/30  100 u  10:30 pm (02/15/2018) - CBG 401 mg% -> Lantus 120 u +  Nov 70/30  100 u   ____500 uTOTAL for Saturday +++++++++++++++++++++++++++++++++++++++++++++++++++++++++++++++++++++++++++ NO Piqray 8:00 am (02/16/2018) - FBG 232 mg% ->   Nov 70/30  80 u 3:00 pm (02/16/2018) - CBG 313 mg% ->   Nov 70/30  80 u  7:00 pm (02/16/2018) - CBG 184 mg% ->   Nov 70/30 20u  10:00 pm (02/16/2018) - CBG 243 mg% -> No Lantus  Nov 70/30 40 u  ____ 220 u TOTAL for Sunday ++++++++++++++++++++++++++++++++++++++++++++++++++++++++++++++++++++++++++++ 10:00 am (02/17/2018) - FBG 72 mg% ->  No Insulin  12:00 pm (02/17/2018) - CBG  202 mg% ->  No Insulin   4:00 pm (02/17/2018) - CBG  299  mg% ->at office visit -    Nov 70/30  30 u At 5:30 pm 11:00 PM (02/17/2018) - CBG  221 mg%   Nov 70/30  15 u At 11:15 pm  ____ 45 units TOTAL for Mon  +++++++++++++++++++++++++++++++++++++++++++++++++++++++++++++++++++++++++++++ 8:30Am (02/18/2018) - FBG147 mg% ->  Nov 70/30 15 u  2:00 Pm (02/18/2018) - CBG 215mg % -> Nov 70/30 15 u 7:00 Pm (02/18/2018) - CBG 194 mg% -> Nov 70/30 30 u 10:00 Pm (02/18/2018) - CBG 145 mg% ->Lantus 20 u     ____ 80Units TOTAL for Tues +++++++++++++++++++++++++++++++++++++++++++++++++++++++++++++++++++++++++++++++ 8:00 am (02/19/2018) - FBG230Mg % ->  Nov 70/3035 u 2:00 Pm (02/19/2018) - CBG 98 Mg% ->  No Insulin 7:00 Pm (02/19/2018) - CBG183Mg % ->  Nov 70/3020 u 11:00 Pm (02/19/2018) - CBG139Mg % ->Lantus 25 u    ____  80 Units TOTAL for Wed ++++++++++++++++++++++++++++++++++++++++++++++++++++++++++++++++++++++++++++++++  8:00 AM(02/20/2018) - FBG122Mg % -> Nov 70/3020u 1:00PM(02/20/2018) - CBG 71Mg % -> No Insulin 6:00 PM (02/20/2018) - CBG103Mg % -> No Insulin 10:00 PM (02/20/2018) - CBG154Mg % ->Lantus 20 u   ____40Units totalfor Thurs ++++++++++++++++++++++++++++++++++++++++++++++++++++++++++++++++++++++++++++++++ Restart Piqray 150 mg - 1 tablet  8:00  AM (02/21/2018) - FBG  176Mg %  -> Nov 70/30  15u  2:30PM(02/21/2018) - CBG 149 Mg% ->     Episode of N/V 2 hr after took [place] &Metformin at 2:30 pm  6:30 PM (02/21/2018) - CBG  261Mg %  ->  Nov 70/30  15u 10:00 PM (02/21/2018) - CBG 236Mg % ->Lantus25 u   ____ 55Units totalfor  Fri +++++++++++++++++++++++++++++++++++++++++++++++++++++++++++++++++++++++++++++++  Piqray 150 mg 1 tablet (day 2)(Metformin on hold ->) 8:00  AM  (02/22/2018)  -  FBG  145Mg % ->  Nov 70/30  10u  2:30 PM(02/22/2018) - CBG 112 Mg% ->      6:30 PM (02/22/2018)  -  CBG 213 Mg% ->  Nov 70/30  10u 10:00 PM  (02/22/2018)  -  CBG 235Mg % ->Lantus 25u   ____  45Units total for Sat ++++++++++++++++++++++++++++++++++++++++++++++++++++++++++++++++++++++++++++++++  Piqray 150 mg - 1 tablet (day3) 8:00  AM  (02/23/2018)  -  FBG  171Mg % ->    Nov 70/30   10u  2:00 PM (02/23/2018) - CBG  207Mg % ->       Nov 70/30  10u  6:00 PM  (02/23/2018)  -  CBG  191Mg % ->     Nov 70/30   10u 10:00 PM  (02/23/2018)  -  CBG  245Mg % ->Lantus35 u    Nov 70/30  10u      _____  75Units total  forSun +++++++++++++++++++++++++++++++++++++++++++++++++++++++++++++++++++++++++++++++++  Piqray 150 mg - 1 tablet (day4) 8:00  AM  (02/24/2018)  -  FBG  148 Mg% ->         2:30 PM (02/24/2018)  -  CBG  274Mg % ->      Nov 70/30   30u  6:00 PM  (02/24/2018)  -  CBG  347Mg % ->     Nov 70/30  30u 10:00 PM  (02/24/2018)  -  CBG  419Mg % ->Lantus60u      Nov 70/30  20u    _____ 140Units total forMon +++++++++++++++++++++++++++++++++++++++++++++++++++++++++++++++++++++++++++++++++  Piqray 150 mg - 1 tablet (day5) 8:00  AM  (02/25/2018)  -  FBG 169 Mg% ->        Nov 70/30 30u  2:30 PM (02/25/2018)  -  CBG  176Mg % ->        Nov 70/30  30u  6:00 PM  (02/25/2018)  -  CBG  251 Mg% ->      Nov 70/30  30u 10:00  PM  (02/25/2018)   -  CBG  286Mg % -> Lantus 80u    Nov 70/30  30 u  _____ 200Units total  forTues +++++++++++++++++++++++++++++++++++++++++++++++++++++++++++++++++++++++++++++++++  Piqray 150 mg - 1 tablet (day6) 8:00  AM  (02/26/2018)  - FBG 93 Mg%  ->      Nov 70/30 25u 2:30 PM  (02/26/2018)  -  CBG  265 Mg% ->         Nov 70/30 40u 6:00 PM   (02/26/2018)  - CBG  313 Mg% ->       Nov 70/30  50u 10:00 PM   (02/26/2018)   -   CBG  389Mg % -> Lantus100 u     Nov 70/30 40u _____ 255Units total forWed +++++++++++++++++++++++++++++++++++++++++++++++++++++++++++++++++++++++++++++++++  Piqray 150 mg - 1 tablet (day7)  8:00  AM  (02/27/2018)  -  FBG  293Mg %  ->        Nov 70/30  60 u 1:30 PM  (02/27/2018)  -   CBG   117Mg %  ->          Nov 70/30  40u 5:30  PM   (02/27/2018)  -  CBG    81Mg %  ->         10:00 PM   (02/27/2018)   -   CBG   92Mg %  -> Lantus 20 u       _____  120Units total  forThurs +++++++++++++++++++++++++++++++++++++++++++++++++++++++++++++++++++++++++++++++++  Piqray 150 mg - 1 tablet (day8) 8:00  AM  (02/28/2018)  -  FBG        121   Mg%     ->     Nov 70/30  10  u 1:30 PM  (02/28/2018)  -   CBG         237   Mg%    ->       Nov 70/30  25 u 5:30  PM   (02/28/2018)  -  CBG         269 Mg%   ->        Nov 70/30  35u 10:00 PM   (02/28/2018)   -   CBG        267  Mg%     ->Lantus 50u          Nov 70/30  15 u      _____  135   Units total forFri +++++++++++++++++++++++++++++++++++++++++++++++++++++++++++++++++++++++++++++++++  Piqray 150 mg - 1 tablet (day9) 8:30  AM  (03/01/2018)  -  FBG         307  Mg%         ->    Nov 70/30   40  u 1:30 PM  (03/01/2018)  -   CBG        157   Mg%        ->    Nov 70/30  20 u 5:30  PM   (03/01/2018)  -  CBG        139   Mg%        ->     Nov 70/30   25  u 10:00 PM   (03/01/2018)   -   CBG        193  Mg%         ->Lantus  70u       Nov 70/30  10   u        _____   165   Units total forSat

## 2018-03-02 ENCOUNTER — Other Ambulatory Visit: Payer: Self-pay | Admitting: Internal Medicine

## 2018-03-02 NOTE — Progress Notes (Unsigned)
(02/08/2018) - FBG 357 mg% ->  Nov 70/30  15 u  6:30 pm (02/08/2018) - CBG 482 mg% ->  Nov 70/30 30 u 11:00 pm (02/08/2018) - CBG 486 mg% ->Lantus 20 u   ____65 u +++++++++++++++++++++++++++++++++++++++++++++++++++++++++++++++++++++++++ 8:30 am (02/09/2018) - FBG 331 mg% ->  Nov 70/30 45 u  6:30 pm (02/09/2018) - CBG 410 mg% ->  Nov 70/30  50 u 11:00pm (02/09/2018) -CBG426 mg% -> Lantus 20 u+ Nov 70/30  20 u  ____ 135 u  ++++++++++++++++++++++++++++++++++++++++++++++++++++++++++++++++++++++++++ 9:00 am (02/11/2018) - FBG 236mg % ->  Nov 70/30 20u 6:30 pm (02/11/2018) - CBG 329mg % ->  Nov 70/30  20u  11:00 pm (02/11/2018) - CBG428 mg% -> ->  Nov 70/30  60 u  ____100 u  ++++++++++++++++++++++++++++++++++++++++++++++++++++++++++++++++++++++++++ 9:30 am (02/12/2018) - FBG 332  mg% -> Lantus 20 u +    Nov 70/30 60 u  4:00 pm (02/12/2018) - CBG  354 mg% -> 7:00 pm (02/12/2018) - CBG 254 mg% ->  Nov 70/30  20 u  11:00 pm (02/12/2018) - CBG199mg %-> Lantus 60 u  ____ 160 u +++++++++++++++++++++++++++++++++++++++++++++++++++++++++++++++++++++++++++ 9:00 am (02/13/2018) - FBG  501mg % ->  Nov 70/30  60u 3:30pm (02/13/2018) -  10:00 pm(02/13/2018) - CBG  556 mg% -> Lantus 80u +  Nov 70/30   60u  ____200 u +++++++++++++++++++++++++++++++++++++++++++++++++++++++++++++++++++++++++++ 8:30 am (02/14/2018) - FBG 377 mg% ->  Nov 70/30  70u 12:00(02/14/2018)- CBG 451 mg%   Nov 70/30  80 u 6:00 pm (02/14/2018) - CBG 452 mg% ->  Nov 70/30  80 u 9:00 pm (02/14/2018) - CBG 428mg % -> Lantus 120 u ____350 u TOTAL for Friday  +++++++++++++++++++++++++++++++++++++++++++++++++++++++++++++++++++++++++++ 8:00 am (02/15/2018)  - FBG 288mg % ->  Nov 70/30  80 u 3:00 pm (02/15/2018) - CBG 436mg % ->  Nov 70/30  100u 7:00 pm (02/15/2018) - CBG 443 mg% ->  Nov 70/30  100 u  10:30 pm (02/15/2018) - CBG 401 mg% -> Lantus 120 u +  Nov 70/30  100 u   ____500 uTOTAL for Saturday +++++++++++++++++++++++++++++++++++++++++++++++++++++++++++++++++++++++++++ NO Piqray 8:00 am (02/16/2018) - FBG 232 mg% ->   Nov 70/30  80 u 3:00 pm (02/16/2018) - CBG 313 mg% ->   Nov 70/30  80 u  7:00 pm (02/16/2018) - CBG 184 mg% ->   Nov 70/30 20u  10:00 pm (02/16/2018) - CBG 243 mg% -> No Lantus  Nov 70/30 40 u  ____ 220 u TOTAL for Sunday ++++++++++++++++++++++++++++++++++++++++++++++++++++++++++++++++++++++++++++ 10:00 am (02/17/2018) - FBG 72 mg% ->  No Insulin  12:00 pm (02/17/2018) - CBG  202 mg% ->  No Insulin   4:00 pm (02/17/2018) - CBG  299  mg% ->at office visit -    Nov 70/30  30 u At 5:30 pm 11:00 PM (02/17/2018) - CBG  221 mg%   Nov 70/30  15 u At 11:15 pm  ____ 45 units TOTAL for Mon  +++++++++++++++++++++++++++++++++++++++++++++++++++++++++++++++++++++++++++++ 8:30Am (02/18/2018) - FBG147 mg% ->  Nov 70/30 15 u  2:00 Pm (02/18/2018) - CBG 215mg % -> Nov 70/30 15 u 7:00 Pm (02/18/2018) - CBG 194 mg% -> Nov 70/30 30 u 10:00 Pm (02/18/2018) - CBG 145 mg% ->Lantus 20 u   ____ 80Units TOTAL for Tues +++++++++++++++++++++++++++++++++++++++++++++++++++++++++++++++++++++++++++++++ 8:00 am (02/19/2018) - FBG230Mg % ->  Nov 70/3035 u 2:00 Pm (02/19/2018) - CBG 98 Mg% ->  No Insulin 7:00 Pm (02/19/2018) - CBG183Mg % ->  Nov 70/3020 u 11:00 Pm (02/19/2018) - CBG139Mg % ->Lantus 25 u   ____   80 Units TOTAL for Wed +++++++++++++++++++++++++++++++++++++++++++++++++++++++++++++++++++++++++++++++++ 8:00 AM(02/20/2018) -  FBG122Mg % -> Nov 70/3020u 1:00PM(02/20/2018) - CBG 71Mg % -> No Insulin 6:00 PM (02/20/2018) - CBG103Mg % -> No Insulin 10:00 PM (02/20/2018) - CBG154Mg % ->Lantus 20 u   ____40Units totalfor Thurs +++++++++++++++++++++++++++++++++++++++++++++++++++++++++++++++++++++++++++++++++ Restart Piqray 150 mg - 1 tablet  8:00  AM (02/21/2018) - FBG  176Mg %  -> Nov 70/30  15u  2:30PM(02/21/2018) - CBG 149 Mg% ->     Episode of N/V 2 hr after took [place] &Metformin at 2:30 pm  6:30 PM (02/21/2018) - CBG  261Mg %  ->  Nov 70/30  15u 10:00 PM (02/21/2018) - CBG 236Mg % ->Lantus25 u   ____ 55Units totalfor  Fri +++++++++++++++++++++++++++++++++++++++++++++++++++++++++++++++++++++++++++++++++  Piqray 150 mg 1 tablet (day 2)(Metformin on hold ->) 8:00  AM  (02/22/2018)  -  FBG  145Mg % ->  Nov 70/30  10u  2:30 PM(02/22/2018) - CBG 112 Mg% ->      6:30 PM (02/22/2018)  -  CBG 213 Mg% ->  Nov 70/30  10u 10:00 PM  (02/22/2018)  -  CBG 235Mg % ->Lantus 25u   ____  45Units total for Sat +++++++++++++++++++++++++++++++++++++++++++++++++++++++++++++++++++++++++++++++++++  Piqray 150 mg - 1 tablet (day3) 8:00  AM  (02/23/2018)  -  FBG  171Mg % ->       Nov 70/30   10u  2:00 PM (02/23/2018) - CBG  207Mg % ->         Nov 70/30  10u  6:00 PM  (02/23/2018)  -  CBG  191Mg % ->        Nov 70/30   10u 10:00 PM  (02/23/2018)  -  CBG  245Mg % ->Lantus35 u    Nov 70/30  10u      _____  75Units total  forSun ++++++++++++++++++++++++++++++++++++++++++++++++++++++++++++++++++++++++++++++++++++ 8:00  AM  (02/25/2018)  -  FBG    169 Mg% ->     Nov 70/30 30u  2:30 PM (02/25/2018)  -  CBG  176Mg % ->       Nov 70/30  30u  6:00 PM  (02/25/2018)  -  CBG  251 Mg% ->    Nov 70/30  30u 10:00  PM  (02/25/2018)   -  CBG  286Mg % -> Lantus 80u +  Nov 70/30  30 u  _____ 200Units total forTues +++++++++++++++++++++++++++++++++++++++++++++++++++++++++++++++++++++++++++++++++++++  Piqray 150 mg - 1 tablet (day6) 8:00  AM  (02/26/2018)  - FBG    93 Mg%  ->    Nov 70/30 25u 2:30 PM  (02/26/2018)  -  CBG  265 Mg% ->       Nov 70/30 40u 6:00 PM   (02/26/2018)  - CBG  313 Mg% ->     Nov 70/30  50u 10:00 PM   (02/26/2018)   -   CBG  389Mg % -> Lantus100 u+   Nov 70/30 40u _____ 255Units total forWed +++++++++++++++++++++++++++++++++++++++++++++++++++++++++++++++++++++++++++++++++++++                                                      Piqray 150 mg - 1 tablet (day7)  8:00  AM  (02/27/2018)  -  FBG   293Mg %  ->       Nov 70/30  60 u 1:30 PM  (02/27/2018)  -   CBG   117Mg %  ->        Nov 70/30 40u 5:30  PM   (02/27/2018)  -  CBG  81Mg %  ->        10:00 PM   (02/27/2018)   -   CBG    92Mg %  -> Lantus 20 u    _____  120Units total forThurs ++++++++++++++++++++++++++++++++++++++++++++++++++++++++++++++++++++++++++++++++++++++++++++++  Piqray 150 mg - 1 tablet (day9) 8:30  AM  (03/01/2018)  -  FBG          307Mg %       ->    Nov 70/30    40 u 1:30 PM  (03/01/2018)  -   CBG        157  Mg%      ->   Nov 70/30    20  u 5:30  PM   (03/01/2018)  -  CBG      139   Mg%        ->   Nov 70/30    25   u 10:00 PM   (03/01/2018)   -   CBG      193  Mg%       ->Lantus 70u +  Nov 70/30    10  u        _____   165   Units total forSat +++++++++++++++++++++++++++++++++++++++++++++++++++++++++++++++++++++++++++++++++++++++++++++++  Piqray 150 mg - 1 tablet (day10) 8:30   AM  (03/02/2018)  -     FBG        286  Mg%       ->  Nov 70/30    35  u 1:30 PM  (03/02/2018)  -     CBG       306  Mg%      ->   Nov 70/30    60  u 5:30  PM   (03/02/2018)  -    CBG      359  Mg%         ->   Nov 70/30    60  u 10:00 PM   (03/02/2018)   -     CBG             Mg%       ->Lantus    u   Nov 70/30         u        _____      Units total forSun +++++++++++++++++++++++++++++++++++++++++++++++++++++++++++++++++++++++++++++++++++++++++++++++  Piqray 150 mg - 1 tablet (day11) 8:30  AM  (03/03/2018)  -   FBG                  Mg%       ->  Nov 70/30          u  1:30 PM  (03/03/2018)  -     CBG              Mg%        ->   Nov 70/30          u 5:30 PM  (03/03/2018)  -     CBG              Mg%        ->   Nov 70/30          u 10:00 PM   (03/03/2018)   -    CBG             Mg%       ->Lantus   u       Nov 70/30  u        _____      Units total forMon +++++++++++++++++++++++++++++++++++++++++++++++++++++++++++++++++++++++++++++++++++++++++++++++

## 2018-03-03 ENCOUNTER — Other Ambulatory Visit: Payer: Self-pay | Admitting: Internal Medicine

## 2018-03-03 DIAGNOSIS — E109 Type 1 diabetes mellitus without complications: Secondary | ICD-10-CM

## 2018-03-03 DIAGNOSIS — R739 Hyperglycemia, unspecified: Secondary | ICD-10-CM

## 2018-03-03 DIAGNOSIS — T50905A Adverse effect of unspecified drugs, medicaments and biological substances, initial encounter: Principal | ICD-10-CM

## 2018-03-03 DIAGNOSIS — E8881 Metabolic syndrome: Secondary | ICD-10-CM

## 2018-03-03 MED ORDER — "INSULIN SYRINGE-NEEDLE U-100 31G X 5/16"" 1 ML MISC": 99 refills | Status: DC

## 2018-03-03 MED ORDER — "INSULIN SYRINGE-NEEDLE U-100 31G X 5/16"" 0.5 ML MISC": 99 refills | Status: DC

## 2018-03-03 MED FILL — LANTUS 100 UNITS/ML VIAL: 100 | 20 days supply | Qty: 10 | Fill #1

## 2018-03-03 MED FILL — TRELEGY ELLIPTA 100-62.5-25: 100-62.5-25 | 30 days supply | Qty: 60 | Fill #3

## 2018-03-03 NOTE — Progress Notes (Signed)
(02/08/2018) - FBG 357 mg% ->  Nov 70/30  15 u  6:30 pm (02/08/2018) - CBG 482 mg% ->  Nov 70/30 30 u 11:00 pm (02/08/2018) - CBG 486 mg% ->Lantus 20 u   ____65 u +++++++++++++++++++++++++++++++++++++++++++++++++++++++++++++++++++++++++ 8:30 am (02/09/2018) - FBG 331 mg% ->  Nov 70/30 45 u  6:30 pm (02/09/2018) - CBG 410 mg% ->  Nov 70/30  50 u 11:00pm (02/09/2018) -CBG426 mg% -> Lantus 20 u+ Nov 70/30  20 u  ____ 135 u  ++++++++++++++++++++++++++++++++++++++++++++++++++++++++++++++++++++++++++ 9:00 am (02/11/2018) - FBG 236mg % ->  Nov 70/30 20u 6:30 pm (02/11/2018) - CBG 329mg % ->  Nov 70/30  20u  11:00 pm (02/11/2018) - CBG428 mg% -> ->  Nov 70/30  60 u  ____100 u  ++++++++++++++++++++++++++++++++++++++++++++++++++++++++++++++++++++++++++ 9:30 am (02/12/2018) - FBG 332  mg% -> Lantus 20 u +    Nov 70/30 60 u  4:00 pm (02/12/2018) - CBG  354 mg% -> 7:00 pm (02/12/2018) - CBG 254 mg% ->  Nov 70/30  20 u  11:00 pm (02/12/2018) - CBG199mg %-> Lantus 60 u  ____ 160 u +++++++++++++++++++++++++++++++++++++++++++++++++++++++++++++++++++++++++++ 9:00 am (02/13/2018) - FBG  501mg % ->  Nov 70/30  60u 3:30pm (02/13/2018) -  10:00 pm(02/13/2018) - CBG  556 mg% -> Lantus 80u +  Nov 70/30   60u  ____200 u +++++++++++++++++++++++++++++++++++++++++++++++++++++++++++++++++++++++++++ 8:30 am (02/14/2018) - FBG 377 mg% ->  Nov 70/30  70u 12:00(02/14/2018)- CBG 451 mg%   Nov 70/30  80 u 6:00 pm (02/14/2018) - CBG 452 mg% ->  Nov 70/30  80 u 9:00 pm (02/14/2018) - CBG 428mg % -> Lantus 120 u ____350 u TOTAL for Friday  +++++++++++++++++++++++++++++++++++++++++++++++++++++++++++++++++++++++++++ 8:00 am (02/15/2018)  - FBG 288mg % ->  Nov 70/30  80 u 3:00 pm (02/15/2018) - CBG 436mg % ->  Nov 70/30  100u 7:00 pm (02/15/2018) - CBG 443 mg% ->  Nov 70/30  100 u  10:30 pm (02/15/2018) - CBG 401 mg% -> Lantus 120 u +  Nov 70/30  100 u   ____500 uTOTAL for Saturday +++++++++++++++++++++++++++++++++++++++++++++++++++++++++++++++++++++++++++ NO Piqray 8:00 am (02/16/2018) - FBG 232 mg% ->   Nov 70/30  80 u 3:00 pm (02/16/2018) - CBG 313 mg% ->   Nov 70/30  80 u  7:00 pm (02/16/2018) - CBG 184 mg% ->   Nov 70/30 20u  10:00 pm (02/16/2018) - CBG 243 mg% -> No Lantus  Nov 70/30 40 u  ____ 220 u TOTAL for Sunday ++++++++++++++++++++++++++++++++++++++++++++++++++++++++++++++++++++++++++++ 10:00 am (02/17/2018) - FBG 72 mg% ->  No Insulin  12:00 pm (02/17/2018) - CBG  202 mg% ->  No Insulin   4:00 pm (02/17/2018) - CBG  299  mg% ->at office visit -    Nov 70/30  30 u At 5:30 pm 11:00 PM (02/17/2018) - CBG  221 mg%   Nov 70/30  15 u At 11:15 pm  ____ 45 units TOTAL for Mon  +++++++++++++++++++++++++++++++++++++++++++++++++++++++++++++++++++++++++++++ 8:30Am (02/18/2018) - FBG147 mg% ->  Nov 70/30 15 u  2:00 Pm (02/18/2018) - CBG 215mg % -> Nov 70/30 15 u 7:00 Pm (02/18/2018) - CBG 194 mg% -> Nov 70/30 30 u 10:00 Pm (02/18/2018) - CBG 145 mg% ->Lantus 20 u   ____ 80Units TOTAL for Tues +++++++++++++++++++++++++++++++++++++++++++++++++++++++++++++++++++++++++++++++ 8:00 am (02/19/2018) - FBG230Mg % ->  Nov 70/3035 u 2:00 Pm (02/19/2018) - CBG 98 Mg% ->  No Insulin 7:00 Pm (02/19/2018) - CBG183Mg % ->  Nov 70/3020 u 11:00 Pm (02/19/2018) - CBG139Mg % ->Lantus 25 u   ____   80 Units TOTAL for Wed +++++++++++++++++++++++++++++++++++++++++++++++++++++++++++++++++++++++++++++++++ 8:00 AM(02/20/2018) -  FBG122Mg % -> Nov 70/3020u 1:00PM(02/20/2018) - CBG 71Mg % -> No Insulin 6:00 PM (02/20/2018) - CBG103Mg % -> No Insulin 10:00 PM (02/20/2018) - CBG154Mg % ->Lantus 20 u   ____40Units totalfor Thurs +++++++++++++++++++++++++++++++++++++++++++++++++++++++++++++++++++++++++++++++++ Restart Piqray 150 mg - 1 tablet  8:00  AM (02/21/2018) - FBG  176Mg %  -> Nov 70/30  15u  2:30PM(02/21/2018) - CBG 149 Mg% ->     Episode of N/V 2 hr after took [place] &Metformin at 2:30 pm  6:30 PM (02/21/2018) - CBG  261Mg %  ->  Nov 70/30  15u 10:00 PM (02/21/2018) - CBG 236Mg % ->Lantus25 u   ____ 55Units totalfor  Fri +++++++++++++++++++++++++++++++++++++++++++++++++++++++++++++++++++++++++++++++++  Piqray 150 mg 1 tablet (day 2)(Metformin on hold ->) 8:00  AM  (02/22/2018)  -  FBG  145Mg % ->  Nov 70/30  10u  2:30 PM(02/22/2018) - CBG 112 Mg% ->      6:30 PM (02/22/2018)  -  CBG 213 Mg% ->  Nov 70/30  10u 10:00 PM  (02/22/2018)  -  CBG 235Mg % ->Lantus 25u   ____  45Units total for Sat +++++++++++++++++++++++++++++++++++++++++++++++++++++++++++++++++++++++++++++++++++  Piqray 150 mg - 1 tablet (day3) 8:00  AM  (02/23/2018)  -  FBG  171Mg % ->     Nov 70/30   10u  2:00 PM (02/23/2018) - CBG  207Mg % ->       Nov 70/30  10u  6:00 PM  (02/23/2018)  -  CBG  191Mg % ->      Nov 70/30   10u 10:00 PM  (02/23/2018)  -  CBG  245Mg % ->Lantus35 u  Nov 70/30  10u  _____  75Units total  forSun ++++++++++++++++++++++++++++++++++++++++++++++++++++++++++++++++++++++++++++++++++++ 8:00  AM  (02/25/2018)  -  FBG    169 Mg% ->     Nov 70/30 30u  2:30 PM (02/25/2018)  -  CBG  176Mg % ->       Nov 70/30  30u  6:00 PM  (02/25/2018)  -  CBG  251 Mg% ->   Nov 70/30  30u 10:00  PM  (02/25/2018)   -  CBG  286Mg % -> Lantus 80u + Nov 70/30  30 u  _____ 200Units total forTues +++++++++++++++++++++++++++++++++++++++++++++++++++++++++++++++++++++++++++++++++++++  Piqray 150 mg - 1 tablet (day6) 8:00  AM  (02/26/2018)  - FBG    93 Mg%  ->    Nov 70/30 25u 2:30 PM  (02/26/2018)  -  CBG  265 Mg% ->      Nov 70/30 40u 6:00 PM   (02/26/2018)  - CBG  313 Mg% ->    Nov 70/30  50u 10:00 PM   (02/26/2018)   -   CBG  389Mg % -> Lantus100 u+ Nov 70/30 40u _____ 255Units total forWed +++++++++++++++++++++++++++++++++++++++++++++++++++++++++++++++++++++++++++++++++++++                                                      Piqray 150 mg - 1 tablet (day7)  8:00  AM  (02/27/2018)  -  FBG   293Mg %  ->     Nov 70/30  60 u 1:30 PM  (02/27/2018)  -   CBG   117Mg %  ->     Nov 70/30 40u 5:30  PM   (02/27/2018)  -  CBG     81Mg %  ->     10:00 PM   (02/27/2018)   -   CBG  92Mg %  -> Lantus 20 u   _____  120Units total forThurs ++++++++++++++++++++++++++++++++++++++++++++++++++++++++++++++++++++++++++++++++++++++++++++++  Piqray 150 mg - 1 tablet (day9) 8:30  AM  (03/01/2018)  -  FBG   307Mg %   ->    Nov 70/30   40 u 1:30 PM  (03/01/2018)  -   CBG  157Mg %   ->   Nov 70/30   20 u 5:30  PM   (03/01/2018)  -  CBG   139Mg %   ->   Nov 70/30  25 u 10:00 PM   (03/01/2018)   -   CBG  193Mg %  ->Lantus70u  +     Nov 70/30   10  u  _____  165Units total forSat +++++++++++++++++++++++++++++++++++++++++++++++++++++++++++++++++++++++++++++++++++++++++++++++  Piqray 150 mg - 1 tablet (day10) 8:30   AM  (03/02/2018)  -    FBG       286   Mg%   ->  Nov 70/30    35  u 1:30 PM  (03/02/2018)  -     CBG  306 Mg%   ->   Nov 70/30   60  u 5:30  PM   (03/02/2018)  -    CBG   359 Mg%    ->   Nov 70/30   60 u 10:00 PM   (03/02/2018)   -     CBG    330  Mg%  ->Lantus 100u + Nov 70/30   60 u    _____  315  Units total forSun +++++++++++++++++++++++++++++++++++++++++++++++++++++++++++++++++++++++++++++++++++++++++++++++  Piqray 150 mg - 1 tablet (day11) 8:30  AM  (03/03/2018)  -   FBG         121  Mg%   ->  Nov 70/30    20 u  1:30 PM  (03/03/2018)  -     CBG    112 Mg%   ->   Nov 70/30  20 u 5:30 PM  (03/03/2018)  -     CBG    219 Mg%   ->   Nov 70/30  35u 10:00 PM   (03/03/2018)   -    CBG   257Mg %  ->Lantus 120u   + Nov 70/30 40 u  _____   235  Units total forMon +++++++++++++++++++++++++++++++++++++++++++++++++++++++++++++++++++++++++++++++++++++++++++++++

## 2018-03-04 ENCOUNTER — Encounter: Payer: Self-pay | Admitting: Internal Medicine

## 2018-03-04 ENCOUNTER — Other Ambulatory Visit: Payer: Self-pay | Admitting: Internal Medicine

## 2018-03-04 NOTE — Patient Instructions (Addendum)
(02/08/2018) - FBG 357 mg% ->  Nov 70/30  15 u  6:30 pm (02/08/2018) - CBG 482 mg% ->  Nov 70/30 30 u 11:00 pm (02/08/2018) - CBG 486 mg% ->Lantus 20 u   ____65 u +++++++++++++++++++++++++++++++++++++++++++++++++++++++++++++++++++++++++ 8:30 am (02/09/2018) - FBG 331 mg% ->  Nov 70/30 45 u  6:30 pm (02/09/2018) - CBG 410 mg% ->  Nov 70/30  50 u 11:00pm (02/09/2018) -CBG426 mg% -> Lantus 20 u+ Nov 70/30  20 u  ____ 135 u  ++++++++++++++++++++++++++++++++++++++++++++++++++++++++++++++++++++++++++ 9:00 am (02/11/2018) - FBG 236mg % ->  Nov 70/30 20u 6:30 pm (02/11/2018) - CBG 329mg % ->  Nov 70/30  20u  11:00 pm (02/11/2018) - CBG428 mg% -> ->  Nov 70/30  60 u  ____100 u  ++++++++++++++++++++++++++++++++++++++++++++++++++++++++++++++++++++++++++ 9:30 am (02/12/2018) - FBG 332  mg% -> Lantus 20 u +    Nov 70/30 60 u  4:00 pm (02/12/2018) - CBG  354 mg% -> 7:00 pm (02/12/2018) - CBG 254 mg% ->  Nov 70/30  20 u  11:00 pm (02/12/2018) - CBG199mg %-> Lantus 60 u  ____ 160 u +++++++++++++++++++++++++++++++++++++++++++++++++++++++++++++++++++++++++++ 9:00 am (02/13/2018) - FBG  501mg % ->  Nov 70/30  60u 3:30pm (02/13/2018) -  10:00 pm(02/13/2018) - CBG  556 mg% -> Lantus 80u +  Nov 70/30   60u  ____200 u +++++++++++++++++++++++++++++++++++++++++++++++++++++++++++++++++++++++++++ 8:30 am (02/14/2018) - FBG 377 mg% ->  Nov 70/30  70u 12:00(02/14/2018)- CBG 451 mg%   Nov 70/30  80 u 6:00 pm (02/14/2018) - CBG 452 mg% ->  Nov 70/30  80 u 9:00 pm (02/14/2018) - CBG 428mg % -> Lantus 120 u ____350 u TOTAL for Friday  +++++++++++++++++++++++++++++++++++++++++++++++++++++++++++++++++++++++++++ 8:00 am (02/15/2018)  - FBG 288mg % ->  Nov 70/30  80 u 3:00 pm (02/15/2018) - CBG 436mg % ->  Nov 70/30  100u 7:00 pm (02/15/2018) - CBG 443 mg% ->  Nov 70/30  100 u  10:30 pm (02/15/2018) - CBG 401 mg% -> Lantus 120 u +  Nov 70/30  100 u   ____500 uTOTAL for Saturday +++++++++++++++++++++++++++++++++++++++++++++++++++++++++++++++++++++++++++ NO Piqray 8:00 am (02/16/2018) - FBG 232 mg% ->   Nov 70/30  80 u 3:00 pm (02/16/2018) - CBG 313 mg% ->   Nov 70/30  80 u  7:00 pm (02/16/2018) - CBG 184 mg% ->   Nov 70/30 20u  10:00 pm (02/16/2018) - CBG 243 mg% -> No Lantus  Nov 70/30 40 u  ____ 220 u TOTAL for Sunday ++++++++++++++++++++++++++++++++++++++++++++++++++++++++++++++++++++++++++++ 10:00 am (02/17/2018) - FBG 72 mg% ->  No Insulin  12:00 pm (02/17/2018) - CBG  202 mg% ->  No Insulin   4:00 pm (02/17/2018) - CBG  299  mg% ->at office visit -    Nov 70/30  30 u At 5:30 pm 11:00 PM (02/17/2018) - CBG  221 mg%   Nov 70/30  15 u At 11:15 pm  ____ 45 units TOTAL for Mon  +++++++++++++++++++++++++++++++++++++++++++++++++++++++++++++++++++++++++++++ 8:30Am (02/18/2018) - FBG147 mg% ->  Nov 70/30 15 u  2:00 Pm (02/18/2018) - CBG 215mg % -> Nov 70/30 15 u 7:00 Pm (02/18/2018) - CBG 194 mg% -> Nov 70/30 30 u 10:00 Pm (02/18/2018) - CBG 145 mg% ->Lantus 20 u   ____ 80Units TOTAL for Tues +++++++++++++++++++++++++++++++++++++++++++++++++++++++++++++++++++++++++++++++ 8:00 am (02/19/2018) - FBG230Mg % ->  Nov 70/3035 u 2:00 Pm (02/19/2018) - CBG 98 Mg% ->  No Insulin 7:00 Pm (02/19/2018) - CBG183Mg % ->  Nov 70/3020 u 11:00 Pm (02/19/2018) - CBG139Mg % ->Lantus 25 u   ____   80 Units TOTAL for Wed +++++++++++++++++++++++++++++++++++++++++++++++++++++++++++++++++++++++++++++++++ 8:00 AM(02/20/2018) -  FBG122Mg % -> Nov 70/3020u 1:00PM(02/20/2018) - CBG 71Mg % -> No Insulin 6:00 PM (02/20/2018) - CBG103Mg % -> No Insulin 10:00 PM (02/20/2018) - CBG154Mg % ->Lantus 20 u   ____40Units totalfor Thurs +++++++++++++++++++++++++++++++++++++++++++++++++++++++++++++++++++++++++++++++++ Restart Piqray 150 mg - 1 tablet  8:00  AM (02/21/2018) - FBG  176Mg %  -> Nov 70/30  15u  2:30PM(02/21/2018) - CBG 149 Mg% ->     Episode of N/V 2 hr after took [place] &Metformin at 2:30 pm  6:30 PM (02/21/2018) - CBG  261Mg %  ->  Nov 70/30  15u 10:00 PM (02/21/2018) - CBG 236Mg % ->Lantus25 u   ____ 55Units totalfor  Fri +++++++++++++++++++++++++++++++++++++++++++++++++++++++++++++++++++++++++++++++++  Piqray 150 mg 1 tablet (day 2)(Metformin on hold ->) 8:00  AM  (02/22/2018)  -  FBG  145Mg % ->  Nov 70/30  10u  2:30 PM(02/22/2018) - CBG 112 Mg% ->      6:30 PM (02/22/2018)  -  CBG 213 Mg% ->  Nov 70/30  10u 10:00 PM  (02/22/2018)  -  CBG 235Mg % ->Lantus 25u   ____  45Units total for Sat +++++++++++++++++++++++++++++++++++++++++++++++++++++++++++++++++++++++++++++++++++  Piqray 150 mg - 1 tablet (day3) 8:00  AM  (02/23/2018)  -  FBG  171Mg % ->  Nov 70/30   10u  2:00 PM (02/23/2018) - CBG  207Mg % ->     Nov 70/30  10u  6:00 PM  (02/23/2018)  -  CBG  191Mg % ->    Nov 70/30   10u 10:00 PM  (02/23/2018)  -  CBG  245Mg % ->Lantus35 u  Nov 70/30  10u  _____  75Units total  forSun ++++++++++++++++++++++++++++++++++++++++++++++++++++++++++++++++++++++++++++++++++++ 8:00  AM  (02/25/2018)  -  FBG  169 Mg% ->     Nov 70/30 30u  2:30 PM (02/25/2018)  -  CBG  176Mg % ->       Nov 70/30  30u  6:00 PM  (02/25/2018)  -  CBG  251 Mg% ->   Nov 70/30  30u 10:00  PM  (02/25/2018)   -  CBG  286Mg % -> Lantus 80u + Nov 70/30  30 u  _____ 200Units total forTues +++++++++++++++++++++++++++++++++++++++++++++++++++++++++++++++++++++++++++++++++++++  Piqray 150 mg - 1 tablet (day6) 8:00  AM  (02/26/2018)  - FBG   93 Mg%  ->   Nov 70/30 25u 2:30 PM  (02/26/2018)  -  CBG  265 Mg% ->      Nov 70/30 40u 6:00 PM   (02/26/2018)  - CBG  313 Mg% ->    Nov 70/30  50u 10:00 PM   (02/26/2018)   -   CBG  389Mg % -> Lantus100 u+ Nov 70/30 40u _____ 255Units total forWed +++++++++++++++++++++++++++++++++++++++++++++++++++++++++++++++++++++++++++++++++++++   Piqray 150 mg - 1 tablet (day7)  8:00  AM  (02/27/2018)  -  FBG   293Mg %  ->     Nov 70/30  60 u 1:30 PM  (02/27/2018)  -   CBG   117Mg %  ->      Nov 70/30 40u 5:30  PM   (02/27/2018)  -  CBG     81Mg %  ->     10:00 PM   (02/27/2018)   -   CBG    92Mg %  -> Lantus 20 u   _____  120Units total forThurs ++++++++++++++++++++++++++++++++++++++++++++++++++++++++++++++++++++++++++++++++++++++++++++++  Piqray 150 mg - 1 tablet (day9) 8:30  AM  (03/01/2018)  -  FBG  307Mg %   ->    Nov 70/30 40 u 1:30 PM  (03/01/2018)  -   CBG  157Mg %   ->  Nov 70/30 20u 5:30  PM   (03/01/2018)  -  CBG   139Mg %   ->    Nov 70/30 25u 10:00 PM   (03/01/2018)   -   CBG  193Mg %  ->Lantus70u  +   Nov 70/30 10 u  _____  165Units total forSat +++++++++++++++++++++++++++++++++++++++++++++++++++++++++++++++++++++++++++++++++++++++++++++++  Piqray 150  mg - 1 tablet (day10) 8:30  AM  (03/02/2018)  -   FBG  286 Mg%   ->    Nov 70/30 35u 1:30 PM  (03/02/2018)  -   CBG  306 Mg%   ->    Nov 70/30 60u 5:30  PM   (03/02/2018)  -   CBG   359 Mg%   ->    Nov 70/30 60u 10:00 PM   (03/02/2018)   -   CBG    330Mg %  ->Lantus 100u + Nov 70/30   60 u    _____  315Units total forSun +++++++++++++++++++++++++++++++++++++++++++++++++++++++++++++++++++++++++++++++++++++++++++++++  Piqray 150 mg - 1 tablet (day11) 8:30  AM  (03/03/2018)  -   FBG  121Mg %  ->    Nov 70/30 20u 1:30 PM  (03/03/2018)  -   CBG   112 Mg%   ->   Nov 70/30 20u 5:30 PM  (03/03/2018)  -   CBG   219 Mg%   ->   Nov 70/30 35u 10:00 PM   (03/03/2018)   -   CBG   257Mg %  ->Lantus 120u   + Nov 70/30 40 u  _____   235  Units total forMon +++++++++++++++++++++++++++++++++++++++++++++++++++++++++++++++++++++++++++++++++++++++++++++++  Piqray 150 mg - 1 tablet  (day12) 8:30  AM  (03/04/2018)  -   FBG  112Mg %  ->     Nov 70/30 20u 1:30 PM  (03/04/2018)  -   CBG     97 Mg%   ->       5:30 PM  (03/04/2018)  -   CBG   198 Mg%   ->    Nov 70/30 10u 10:00 PM   (03/04/2018)   -   CBG   211Mg %  ->Lantus 110u   +  Nov 70/30 10 u  _____   150  Units total forTues +++++++++++++++++++++++++++++++++++++++++++++++++++++++++++++++++++++++++++++++++++++++++++++++  Piqray 150 mg - 1 tablet (day13) 8:30  AM  (03/05/2018)  -   FBG   126   Mg%  ->     Nov 70/30   20 u 1:30 PM   (03/05/2018)  -   CBG    82   Mg%   ->      Nov 70/30   6:00 PM   (03/05/2018)  -   CBG   188    Mg%   ->    Nov 70/30  20 u 10:00 PM    (03/05/2018)   -   CBG    207    Mg%  ->Lantus 110 u   +   Nov 70/30  10 u  _____  160   Units total forWed

## 2018-03-04 NOTE — Progress Notes (Signed)
(02/08/2018) - FBG 357 mg% ->  Nov 70/30  15 u  6:30 pm (02/08/2018) - CBG 482 mg% ->  Nov 70/30 30 u 11:00 pm (02/08/2018) - CBG 486 mg% ->Lantus 20 u                                 ____65 u +++++++++++++++++++++++++++++++++++++++++++++++++++++++++++++++++++++++++ 8:30 am (02/09/2018) - FBG 331 mg% ->  Nov 70/30 45 u  6:30 pm (02/09/2018) - CBG 410 mg% ->  Nov 70/30  50 u 11:00pm (02/09/2018) -CBG426 mg% -> Lantus 20 u+ Nov 70/30  20 u  ____ 135 u  +++++++++++++++++++++++++++++++++++++++++++++++++++++++++++++++++++++++++++ 9:00 am (02/13/2018) - FBG  501mg % ->  Nov 70/30  60u 3:30pm (02/13/2018) -  10:00 pm(02/13/2018) - CBG  556 mg% -> Lantus 80u +  Nov 70/30   60u                            ____200 u +++++++++++++++++++++++++++++++++++++++++++++++++++++++++++++++++++++++++++ 8:30 am (02/14/2018) - FBG 377 mg% ->  Nov 70/30   70u 12:00(02/14/2018)- CBG 451 mg%   Nov 70/30  80 u 6:00 pm (02/14/2018) - CBG 452 mg% ->  Nov 70/30  80 u 9:00 pm (02/14/2018) - CBG 428mg % -> Lantus 120 u ____350 u TOTAL for Friday  +++++++++++++++++++++++++++++++++++++++++++++++++++++++++++++++++++++++++++ 8:00 am (02/15/2018) - FBG 288mg % ->  Nov 70/30  80 u 3:00 pm (02/15/2018) - CBG 436mg % ->  Nov 70/30  100u 7:00 pm (02/15/2018) - CBG 443 mg% ->  Nov 70/30  100 u  10:30 pm (02/15/2018) - CBG 401 mg% -> Lantus 120 u +  Nov 70/30  100 u   ____500 uTOTAL for Saturday +++++++++++++++++++++++++++++++++++++++++++++++++++++++++++++++++++++++++++ NO Piqray 8:00 am (02/16/2018) - FBG 232 mg% ->   Nov 70/30  80 u 3:00 pm (02/16/2018) - CBG 313 mg% ->   Nov 70/30  80 u  7:00 pm (02/16/2018) - CBG 184 mg% ->   Nov 70/30 20u   10:00 pm (02/16/2018) - CBG 243 mg% -> No Lantus  Nov 70/30 40 u  ____ 220 u TOTAL for Sunday ++++++++++++++++++++++++++++++++++++++++++++++++++++++++++++++++++++++++++++ ++++++++++++++++++++++++++++++++++++++++++++++++++++++++++++++++++++++++++++ Restart Piqray 150 mg - 1 tablet  8:00  AM (02/21/2018) - FBG  176Mg %  -> Nov 70/30  15u  2:30PM(02/21/2018) - CBG 149 Mg% ->     Episode of N/V 2 hr after took [place] &Metformin at 2:30 pm  6:30 PM (02/21/2018) - CBG  261Mg %  ->  Nov 70/30  15u 10:00 PM (02/21/2018) - CBG 236Mg % ->Lantus25 u   ____ 55Units totalfor Fri +++++++++++++++++++++++++++++++++++++++++++++++++++++++++++++++++++++++++++++++++  Piqray 150 mg 1 tablet (day 2)(Metformin on hold ->) 8:00  AM  (02/22/2018)  -  FBG  145Mg % ->  Nov 70/30  10u  2:30 PM(02/22/2018) - CBG 112 Mg% ->      6:30 PM  (02/22/2018)  -  CBG 213 Mg% ->  Nov 70/30  10u 10:00 PM  (02/22/2018)  -  CBG 235Mg % ->Lantus 25u   ____  45Units total for Sat +++++++++++++++++++++++++++++++++++++++++++++++++++++++++++++++++++++++++++++++++++  Piqray 150 mg - 1 tablet (day3) 8:00  AM  (02/23/2018)  - FBG   171Mg % ->  Nov 70/30   10u  2:00 PM (02/23/2018) - CBG  207Mg % ->     Nov 70/30  10u  6:00 PM  (02/23/2018)  -  CBG  191Mg % ->    Nov 70/30   10u 10:00 PM  (02/23/2018)  -  CBG  245Mg % ->  Lantus35 u      Nov 70/30   10u      _____  75Units total forSun ++++++++++++++++++++++++++++++++++++++++++++++++++++++++++++++++++++++++++++++++++++ 8:00  AM  (02/25/2018)  -  FBG  169 Mg% ->      Nov 70/30 30u  2:30 PM (02/25/2018)  -  CBG  176Mg % ->       Nov 70/30   30u  6:00 PM  (02/25/2018)  -  CBG  251 Mg% ->   Nov 70/30   30u 10:00  PM   (02/25/2018)   -  CBG  286Mg % -> Lantus 80u + Nov 70/30    30 u     _____ 200Units total forTues +++++++++++++++++++++++++++++++++++++++++++++++++++++++++++++++++++++++++++++++++++++  Piqray 150 mg - 1 tablet (day6) 8:00  AM  (02/26/2018)  - FBG   93 Mg%  ->   Nov 70/30 25u 2:30 PM  (02/26/2018)  -  CBG  265 Mg% ->      Nov 70/30 40u 6:00 PM   (02/26/2018)  - CBG  313 Mg% ->    Nov 70/30  50u 10:00 PM   (02/26/2018)   -   CBG  389Mg % -> Lantus100 u+ Nov 70/30 40u _____ 255Units total forWed +++++++++++++++++++++++++++++++++++++++++++++++++++++++++++++++++++++++++++++++++++++  Piqray 150 mg - 1 tablet (day7)  8:00  AM  (02/27/2018)  -  FBG   293Mg %  ->     Nov 70/30  60 u 1:30 PM  (02/27/2018)  -   CBG   117Mg %  ->      Nov 70/30 40u 5:30  PM   (02/27/2018)  -  CBG     81Mg %  ->     10:00 PM   (02/27/2018)   -   CBG    92Mg %   -> Lantus 20 u    _____  120Units total forThurs ++++++++++++++++++++++++++++++++++++++++++++++++++++++++++++++++++++++++++++++++++++++++++++++  Piqray 150 mg - 1 tblet (day9) 8:30  AM  (03/01/2018)  -  FBG  307Mg %   ->    Nov 70/30 40 u 1:30 PM  (03/01/2018)  -   CBG  157Mg %   ->   Nov 70/30 20u 5:30  PM   (03/01/2018)  -  CBG   139Mg %   ->   Nov 70/30 25u 10:00 PM   (03/01/2018)   -   CBG  193Mg %  ->Lantus70u  +   Nov 70/30 10 u  _____  165Units total forSat +++++++++++++++++++++++++++++++++++++++++++++++++++++++++++++++++++++++++++++++++++++++++++++++  Piqray 150 mg - 1 tablet (day10) 8:30  AM  (03/02/2018)  -   FBG  286 Mg%   ->   Nov 70/30 35u 1:30 PM  (03/02/2018)  -   CBG  306 Mg%   ->    Nov 70/30 60u 5:30  PM   (03/02/2018)  -   CBG   359 Mg%   ->    Nov 70/30 60u 10:00 PM   (03/02/2018)   -   CBG    330Mg %  ->Lantus 100u + Nov 70/30   60  u    _____  315Units total forSun +++++++++++++++++++++++++++++++++++++++++++++++++++++++++++++++++++++++++++++++++++++++++++++++  Piqray 150 mg - 1 tablet (day11) 8:30  AM  (03/03/2018)  -   FBG  121Mg %   ->   Nov 70/30 20u 1:30 PM  (03/03/2018)  -   CBG   112 Mg%   ->    Nov 70/30 20u 5:30 PM  (03/03/2018)  -   CBG   219 Mg%   ->    Nov 70/30 35u 10:00 PM   (03/03/2018)   -  CBG   257Mg %  ->Lantus 120u   +  Nov 70/30 40 u  _____   235  Units total forMon +++++++++++++++++++++++++++++++++++++++++++++++++++++++++++++++++++++++++++++++++++++++++++++++  Piqray 150 mg - 1 tablet (day12) 8:30  AM  (03/04/2018)  -     FBG    112  Mg%   ->    Nov 70/30   20 u 1:30 PM  (03/04/2018)  -      CBG    97 Mg%   ->           5:30 PM  (03/04/2018)  -      CBG     Mg%   ->    Nov 70/30  10  u  10:00 PM   (03/04/2018)   -       CBG    Mg%  ->Lantus110 u   +    Nov 70/30  10  u  _____   150 Units total forTue

## 2018-03-04 NOTE — Progress Notes (Signed)
FINGER STICK- 185 at 3:30 pm  Subjective:    Patient ID: Suzanne [name], female    DOB: October 15, 1955, 62 y.o.   MRN: [id number]  HPI  This nice 62 yo MWF patient of  Dr Geralyn Flash  w/ Metastatic Breast Ca being managed for drug induced Hyperglycemia and moderate/severe insulin resistance consequent of Piqray for remission induction of her metastatic disease. Patient's attitude remains positive.    Shortly after starting [place] (02/01/18)  and failing control w/Metformin (in part due to GI intolerance) she  required increasing doses of insulin  (Lantus & Novolin 70/30) up to 500 units when Piqray was held for several days to allow control of her hyperglycemia. Then Piqray was restarted at 1/2 dose (150 mg) 13 days ago with hs Lantus dose increased stepwise to spare increasing dosing of daytime SS of Nov 70/30.     For the last 2 days with hs dosing of Lantus approx 110-220 u and 3-4 x daily dosing with Novolin 70/30, it appears her insulin resistance is finally leveling off toward Grade 1 Hyperglycemia toxicity.  Patient does report she felt "shaky" earlier today before lunch with a CBG 88 mg%.   Medication Sig  . Albuterol  HFA  inhaler 1 inhalation  EVERY 6 HOURS AS NEEDED   . Albuterol 2.5 MG/3ML neb soln INHALE 1 VIAL by NEB EVERY 6 HRS AS NEEDED   . Alpelisib 300 MG  (PIQRAY 300MG  DAILY DOSE) 2x150 MG TBPK Take 300 mg by mouth daily. Taking 1 cap daily last 13 days  . bisoprolol  5 MG tablet TAKE 1 TABLET  DAILY  . OS-CAL 500 Ca MG tablet Take 1 tablet daily  . VITAMIN D  1000 units  Take 1,000 Units  daily.  . fentaNYL  - 25 MCG/HR patch  1 patch  every 3days.  Marland Kitchen FLONASE nasal spray 2 sprays into  nostrils daily.  . TRELEGY ELLIPTA 100-62.5-25  1 puff  Inhalation daily.  Marland Kitchen DILAUDID 2 MG  Take 1 tablet  every 4 hrs as needed for severe pain.  Marland Kitchen ibuprofen  200 MG tablet Take  every 6  hours as needed.  Marland Kitchen LANTUS 100 UNIT/ML injec Inject  as directed.  Marland Kitchen NOVOLIN 70/30 Take 75 units 2 x/day or as  directed  . letrozole (FEMARA) 2.5 MG tablet TAKE 1 TABLET  ONCE DAILY  . loratadine (CLARITIN) 10 MG tablet Take 1 tablet  daily.  Marland Kitchen LORazepam (ATIVAN) 2 MG tablet TAKE 1/2 TO 1 TABLET  AT BEDTIME  . metFORMIN-XR) 500 MG 24 hr  Take 2 tablets 2 x/ day for Diabetes - Not taking - GI intolerance   . ZOFRAN 8 MG tablet Take 1 tablet 3 x /day for Nausea  . COMPAZINE 10 MG tablet Take 1 tablet  every 6  hours as needed for nausea or vomiting.  Marland Kitchen PHENERGAN 25 MG tablet Take 1 tablet  every 6  hours as needed for nausea.  Marland Kitchen tiZANidine 4 MG tablet Take 1 tablet  2  times daily as needed for muscle spasms.   Allergies  Allergen Reactions  . Ace Inhibitors     cough   Past Medical History:  Diagnosis Date  . Alcoholism ([place])   . Cancer [facility])    breast ca - 1994; recurred in 2007. s/p masectomy with flap rconstruction aand chemo '94. local recurrence on chest wall. chemo and XRT '07. now femara  . Depression   . Dyspnea    myoview 2011:  EF 55% question of  mild reverible anterior defect. thought to be breast attenuation. echo 45-50% with global HK. Grade 1 diastolyic dysfunction. RV nml. cardiac MRI with EF 44% with septal HK. no scar   . History of coronary artery stent placement   . Hyperlipidemia   . Hypertension   . Insomnia   . Lung disorder    Past Surgical History:  Procedure Laterality Date  . CARDIAC CATHETERIZATION     [facility]  . MASTECTOMY    . ORIF DISTAL RADIUS FRACTURE    . PORT-A-CATH REMOVAL  07/29/2012   Procedure: REMOVAL PORT-A-CATH;  Surgeon: Haywood Lasso, MD;  Location: [place];  Service: General;  Laterality: Left;  . R masectomy  '94   . R transflap  '94   Review of Systems  10 point systems review negative except as above.    Objective:   Physical Exam  BP (!) 137/97   Pulse (!) 105   Temp (!) 97.3 F (36.3 C)   Resp 16   Wt 178 lb 3.2 oz (80.8 kg)   SpO2 99%   BMI 29.65 kg/m  O2 sats 96% on 2-3 liters at rest   In no  distress. No cyanosis, icterus or exposed rash.   HEENT - WNL. Neck - supple.  Chest - Clear equal BS. Cor - Nl HS. RRR w/o sig MGR. PP 1(+). No edema. MS- FROM w/o deformities.  Gait Nl. Neuro -  Nl w/o focal abnormalities.    Assessment & Plan:   1. Type 1 diabetes mellitus without complication (HCC)  - COMPLETE METABOLIC PANEL WITH GFR  - discussed w/patient plan to continue increased hs Lantus dosing as required to decrease SS dosing to 2x / day   2. Hyperglycemia, drug-induced  - COMPLETE METABOLIC PANEL WITH GFR  3. Insulin resistance  - COMPLETE METABOLIC PANEL WITH GFR  4. Metastatic breast carcinoma (HCC)  - CBC with Differential/Platelet - COMPLETE METABOLIC PANEL WITH GFR  5. Medication management  - CBC with Differential/Platelet - COMPLETE METABOLIC PANEL WITH GFR

## 2018-03-05 ENCOUNTER — Encounter: Payer: Self-pay | Admitting: Internal Medicine

## 2018-03-05 ENCOUNTER — Ambulatory Visit: Payer: Medicare Other | Admitting: Internal Medicine

## 2018-03-05 VITALS — BP 137/97 | HR 105 | Temp 97.3°F | Resp 16 | Ht 62.5 in | Wt 178.2 lb

## 2018-03-05 DIAGNOSIS — C50919 Malignant neoplasm of unspecified site of unspecified female breast: Secondary | ICD-10-CM

## 2018-03-05 DIAGNOSIS — T50905A Adverse effect of unspecified drugs, medicaments and biological substances, initial encounter: Secondary | ICD-10-CM | POA: Diagnosis not present

## 2018-03-05 DIAGNOSIS — Z79899 Other long term (current) drug therapy: Secondary | ICD-10-CM

## 2018-03-05 DIAGNOSIS — E109 Type 1 diabetes mellitus without complications: Secondary | ICD-10-CM | POA: Diagnosis not present

## 2018-03-05 DIAGNOSIS — E8881 Metabolic syndrome: Secondary | ICD-10-CM

## 2018-03-05 DIAGNOSIS — R739 Hyperglycemia, unspecified: Secondary | ICD-10-CM

## 2018-03-05 MED FILL — BD INSULIN SYR 0.5 ML 8MMX3: 31G X 5/16" | 40 days supply | Qty: 200 | Fill #0

## 2018-03-05 MED FILL — BD UF INS SYR 1 ML 31GX5/16: 31G X 5/16" | 40 days supply | Qty: 200 | Fill #0

## 2018-03-06 ENCOUNTER — Other Ambulatory Visit: Payer: Self-pay | Admitting: Internal Medicine

## 2018-03-06 LAB — CBC WITH DIFFERENTIAL/PLATELET
BASOS PCT: 0.8 %
Basophils Absolute: 86 cells/uL (ref 0–200)
EOS ABS: 119 {cells}/uL (ref 15–500)
Eosinophils Relative: 1.1 %
HCT: 34.5 % — ABNORMAL LOW (ref 35.0–45.0)
Hemoglobin: 12.5 g/dL (ref 11.7–15.5)
Lymphs Abs: 1264 cells/uL (ref 850–3900)
MCH: 38.5 pg — AB (ref 27.0–33.0)
MCHC: 36.2 g/dL — ABNORMAL HIGH (ref 32.0–36.0)
MCV: 106.2 fL — AB (ref 80.0–100.0)
MPV: 9.8 fL (ref 7.5–12.5)
Monocytes Relative: 13.9 %
NEUTROS PCT: 72.5 %
Neutro Abs: 7830 cells/uL — ABNORMAL HIGH (ref 1500–7800)
Platelets: 178 10*3/uL (ref 140–400)
RBC: 3.25 10*6/uL — ABNORMAL LOW (ref 3.80–5.10)
RDW: 13.3 % (ref 11.0–15.0)
Total Lymphocyte: 11.7 %
WBC: 10.8 10*3/uL (ref 3.8–10.8)
WBCMIX: 1501 {cells}/uL — AB (ref 200–950)

## 2018-03-06 LAB — COMPLETE METABOLIC PANEL WITH GFR
AG RATIO: 1.1 (calc) (ref 1.0–2.5)
ALT: 43 U/L — AB (ref 6–29)
AST: 81 U/L — ABNORMAL HIGH (ref 10–35)
Albumin: 3.6 g/dL (ref 3.6–5.1)
Alkaline phosphatase (APISO): 106 U/L (ref 33–130)
BUN: 8 mg/dL (ref 7–25)
CALCIUM: 8.8 mg/dL (ref 8.6–10.4)
CO2: 34 mmol/L — AB (ref 20–32)
CREATININE: 0.69 mg/dL (ref 0.50–0.99)
Chloride: 94 mmol/L — ABNORMAL LOW (ref 98–110)
GFR, EST AFRICAN AMERICAN: 110 mL/min/{1.73_m2} (ref >=60)
GFR, EST NON AFRICAN AMERICAN: 95 mL/min/{1.73_m2} (ref >=60)
GLOBULIN: 3.3 g/dL (ref 1.9–3.7)
Glucose, Bld: 156 mg/dL — ABNORMAL HIGH (ref 65–99)
POTASSIUM: 3 mmol/L — AB (ref 3.5–5.3)
SODIUM: 137 mmol/L (ref 135–146)
TOTAL PROTEIN: 6.9 g/dL (ref 6.1–8.1)
Total Bilirubin: 1.5 mg/dL — ABNORMAL HIGH (ref 0.2–1.2)

## 2018-03-06 MED ORDER — POTASSIUM CHLORIDE CRYS ER 20 MEQ PO TBCR: EXTENDED_RELEASE_TABLET | ORAL | 1 refills | Status: DC

## 2018-03-06 MED ORDER — BISOPROLOL FUMARATE 5 MG PO TABS: ORAL_TABLET | ORAL | 1 refills | Status: DC

## 2018-03-06 MED ORDER — INSULIN GLARGINE 100 UNIT/ML ~~LOC~~ SOLN: SUBCUTANEOUS | 11 refills | Status: DC

## 2018-03-06 MED FILL — POTASSIUM CL ER 20 MEQ TABL: 20 | 90 days supply | Qty: 90 | Fill #0

## 2018-03-06 NOTE — Progress Notes (Signed)
(02/08/2018) - FBG 357 mg% ->  Nov 70/30  15 u  6:30 pm (02/08/2018) - CBG 482 mg% ->  Nov 70/30 30 u 11:00 pm (02/08/2018) - CBG 486 mg% ->Lantus 20 u   ____65 u +++++++++++++++++++++++++++++++++++++++++++++++++++++++++++++++++++++++++ 8:30 am (02/09/2018) - FBG 331 mg% ->  Nov 70/30 45 u  6:30 pm (02/09/2018) - CBG 410 mg% ->  Nov 70/30  50 u 11:00pm (02/09/2018) -CBG426 mg% -> Lantus 20 u+ Nov 70/30  20 u  ____ 135 u  ++++++++++++++++++++++++++++++++++++++++++++++++++++++++++++++++++++++++++ 9:00 am (02/11/2018) - FBG 236mg % ->  Nov 70/30 20u 6:30 pm (02/11/2018) - CBG 329mg % ->  Nov 70/30  20u  11:00 pm (02/11/2018) - CBG428 mg% -> ->  Nov 70/30  60 u  ____100 u  ++++++++++++++++++++++++++++++++++++++++++++++++++++++++++++++++++++++++++ 9:30 am (02/12/2018) - FBG 332  mg% -> Lantus 20 u +    Nov 70/30 60 u  4:00 pm (02/12/2018) - CBG  354 mg% -> 7:00 pm (02/12/2018) - CBG 254 mg% ->  Nov 70/30  20 u  11:00 pm (02/12/2018) - CBG199mg %-> Lantus 60 u  ____ 160 u +++++++++++++++++++++++++++++++++++++++++++++++++++++++++++++++++++++++++++ 9:00 am (02/13/2018) - FBG  501mg % ->  Nov 70/30  60u 3:30pm (02/13/2018) -  10:00 pm(02/13/2018) - CBG  556 mg% -> Lantus 80u +  Nov 70/30   60u  ____200 u +++++++++++++++++++++++++++++++++++++++++++++++++++++++++++++++++++++++++++ 8:30 am (02/14/2018) - FBG 377 mg% ->  Nov 70/30  70u 12:00(02/14/2018)- CBG 451 mg%   Nov 70/30  80 u 6:00 pm (02/14/2018) - CBG 452 mg% ->  Nov 70/30  80 u 9:00 pm (02/14/2018) - CBG 428mg % -> Lantus 120 u ____350 u TOTAL for Friday  +++++++++++++++++++++++++++++++++++++++++++++++++++++++++++++++++++++++++++ 8:00 am (02/15/2018)  - FBG 288mg % ->  Nov 70/30  80 u 3:00 pm (02/15/2018) - CBG 436mg % ->  Nov 70/30  100u 7:00 pm (02/15/2018) - CBG 443 mg% ->  Nov 70/30  100 u  10:30 pm (02/15/2018) - CBG 401 mg% -> Lantus 120 u +  Nov 70/30  100 u   ____500 uTOTAL for Saturday +++++++++++++++++++++++++++++++++++++++++++++++++++++++++++++++++++++++++++ NO Piqray 8:00 am (02/16/2018) - FBG 232 mg% ->   Nov 70/30  80 u 3:00 pm (02/16/2018) - CBG 313 mg% ->   Nov 70/30  80 u  7:00 pm (02/16/2018) - CBG 184 mg% ->   Nov 70/30 20u  10:00 pm (02/16/2018) - CBG 243 mg% -> No Lantus  Nov 70/30 40 u  ____ 220 u TOTAL for Sunday ++++++++++++++++++++++++++++++++++++++++++++++++++++++++++++++++++++++++++++ 10:00 am (02/17/2018) - FBG 72 mg% ->  No Insulin  12:00 pm (02/17/2018) - CBG  202 mg% ->  No Insulin   4:00 pm (02/17/2018) - CBG  299  mg% ->at office visit -    Nov 70/30  30 u At 5:30 pm 11:00 PM (02/17/2018) - CBG  221 mg%   Nov 70/30  15 u At 11:15 pm  ____ 45 units TOTAL for Mon  +++++++++++++++++++++++++++++++++++++++++++++++++++++++++++++++++++++++++++++ 8:30Am (02/18/2018) - FBG147 mg% ->  Nov 70/30 15 u  2:00 Pm (02/18/2018) - CBG 215mg % -> Nov 70/30 15 u 7:00 Pm (02/18/2018) - CBG 194 mg% -> Nov 70/30 30 u 10:00 Pm (02/18/2018) - CBG 145 mg% ->Lantus 20 u   ____ 80Units TOTAL for Tues +++++++++++++++++++++++++++++++++++++++++++++++++++++++++++++++++++++++++++++++ 8:00 am (02/19/2018) - FBG230Mg % ->  Nov 70/3035 u 2:00 Pm (02/19/2018) - CBG 98 Mg% ->  No Insulin 7:00 Pm (02/19/2018) - CBG183Mg % ->  Nov 70/3020 u 11:00 Pm (02/19/2018) - CBG139Mg % ->Lantus 25 u   ____   80 Units TOTAL for Wed +++++++++++++++++++++++++++++++++++++++++++++++++++++++++++++++++++++++++++++++++ 8:00 AM(02/20/2018) -  FBG122Mg % -> Nov 70/3020u 1:00PM(02/20/2018) - CBG 71Mg % -> No Insulin 6:00 PM (02/20/2018) - CBG103Mg % -> No Insulin 10:00 PM (02/20/2018) - CBG154Mg % ->Lantus 20 u   ____40Units totalfor Thurs +++++++++++++++++++++++++++++++++++++++++++++++++++++++++++++++++++++++++++++++++ Restart Piqray 150 mg - 1 tablet  8:00  AM (02/21/2018) - FBG  176Mg %  -> Nov 70/30  15u  2:30PM(02/21/2018) - CBG 149 Mg% ->     Episode of N/V 2 hr after took [place] &Metformin at 2:30 pm  6:30 PM (02/21/2018) - CBG  261Mg %  ->  Nov 70/30  15u 10:00 PM (02/21/2018) - CBG 236Mg % ->Lantus25 u   ____ 55Units totalfor  Fri +++++++++++++++++++++++++++++++++++++++++++++++++++++++++++++++++++++++++++++++++  Piqray 150 mg 1 tablet (day 2)(Metformin on hold ->) 8:00  AM  (02/22/2018)  -  FBG  145Mg % ->  Nov 70/30  10u  2:30 PM(02/22/2018) - CBG 112 Mg% ->      6:30 PM (02/22/2018)  -  CBG 213 Mg% ->  Nov 70/30  10u 10:00 PM  (02/22/2018)  -  CBG 235Mg % ->Lantus 25u   ____  45Units total for Sat +++++++++++++++++++++++++++++++++++++++++++++++++++++++++++++++++++++++++++++++++++  Piqray 150 mg - 1 tablet (day3) 8:00  AM  (02/23/2018)  -  FBG  171Mg % ->  Nov 70/30   10u  2:00 PM (02/23/2018) - CBG  207Mg % ->     Nov 70/30  10u  6:00 PM  (02/23/2018)  -  CBG  191Mg % ->    Nov 70/30   10u 10:00 PM  (02/23/2018)  -  CBG  245Mg % ->Lantus35 u  Nov 70/30  10u  _____  75Units total  forSun ++++++++++++++++++++++++++++++++++++++++++++++++++++++++++++++++++++++++++++++++++++ 8:00  AM  (02/25/2018)  -  FBG  169 Mg% ->     Nov 70/30 30u  2:30 PM (02/25/2018)  -  CBG  176Mg % ->       Nov 70/30  30u  6:00 PM  (02/25/2018)  -  CBG  251 Mg% ->   Nov 70/30  30u 10:00  PM  (02/25/2018)   -  CBG  286Mg % -> Lantus 80u + Nov 70/30  30 u  _____ 200Units total forTues +++++++++++++++++++++++++++++++++++++++++++++++++++++++++++++++++++++++++++++++++++++  Piqray 150 mg - 1 tablet (day6) 8:00  AM  (02/26/2018)  - FBG   93 Mg%  ->   Nov 70/30 25u 2:30 PM  (02/26/2018)  -  CBG  265 Mg% ->      Nov 70/30 40u 6:00 PM   (02/26/2018)  - CBG  313 Mg% ->    Nov 70/30  50u 10:00 PM   (02/26/2018)   -   CBG  389Mg % -> Lantus100 u+ Nov 70/30 40u _____ 255Units total forWed +++++++++++++++++++++++++++++++++++++++++++++++++++++++++++++++++++++++++++++++++++++   Piqray 150 mg - 1 tablet (day7)  8:00  AM  (02/27/2018)  -  FBG   293Mg %  ->     Nov 70/30  60 u 1:30 PM  (02/27/2018)  -   CBG   117Mg %  ->      Nov 70/30 40u 5:30  PM   (02/27/2018)  -  CBG     81Mg %  ->     10:00 PM   (02/27/2018)   -   CBG    92Mg %  -> Lantus 20 u   _____  120Units total forThurs ++++++++++++++++++++++++++++++++++++++++++++++++++++++++++++++++++++++++++++++++++++++++++++++  Piqray 150 mg - 1 tablet (day9) 8:30  AM  (03/01/2018)  -  FBG  307Mg %   ->    Nov 70/30 40 u 1:30 PM  (03/01/2018)  -   CBG  157Mg %   ->  Nov 70/30 20u 5:30  PM   (03/01/2018)  -  CBG   139Mg %   ->    Nov 70/30 25u 10:00 PM   (03/01/2018)   -   CBG  193Mg %  ->Lantus70u  +   Nov 70/30 10 u  _____  165Units total forSat +++++++++++++++++++++++++++++++++++++++++++++++++++++++++++++++++++++++++++++++++++++++++++++++  Piqray 150  mg - 1 tablet (day10) 8:30  AM  (03/02/2018)  -   FBG  286 Mg%   ->    Nov 70/30 35u 1:30 PM  (03/02/2018)  -   CBG  306 Mg%   ->    Nov 70/30 60u 5:30  PM   (03/02/2018)  -   CBG   359 Mg%   ->    Nov 70/30 60u 10:00 PM   (03/02/2018)   -   CBG    330Mg %  ->Lantus 100u + Nov 70/30   60 u    _____  315Units total forSun +++++++++++++++++++++++++++++++++++++++++++++++++++++++++++++++++++++++++++++++++++++++++++++++  Piqray 150 mg - 1 tablet (day11) 8:30  AM  (03/03/2018)  -   FBG  121Mg %  ->      Nov 70/30 20u 1:30 PM  (03/03/2018)  -   CBG   112 Mg%   ->      Nov 70/30 20u 5:30 PM  (03/03/2018)  -   CBG   219 Mg%   ->    Nov 70/30 35u 10:00 PM   (03/03/2018)   -   CBG   257Mg %  ->Lantus 120u   + Nov 70/30 40 u  _____   235  Units total forMon +++++++++++++++++++++++++++++++++++++++++++++++++++++++++++++++++++++++++++++++++++++++++++++++  Piqray 150 mg - 1 tablet  (day12) 8:30  AM  (03/04/2018)  -   FBG  112Mg %  ->       Nov 70/30 20u 1:30 PM  (03/04/2018)  -   CBG     97 Mg%   ->         5:30 PM  (03/04/2018)  -   CBG   198 Mg%   ->      Nov 70/30 10u 10:00 PM   (03/04/2018)   -   CBG   211Mg %  ->Lantus 110  u  + Nov 70/30 10 u  _____   150  Units total forTues +++++++++++++++++++++++++++++++++++++++++++++++++++++++++++++++++++++++++++++++++++++++++++++++  Piqray 150 mg - 1 tablet (day13) 8:30  AM   (03/05/2018)  -   FBG   126   Mg%  ->      Nov 70/30   20 u 1:30 PM   (03/05/2018)  -   CBG    82   Mg%   ->        6:00 PM   (03/05/2018)  -   CBG   188    Mg%   ->     Nov 70/30   20 u 10:00 PM    (03/05/2018)   -   CBG    207    Mg%  ->Lantus 110   u   +   Nov 70/30 10 u  _____  160   Units total forWed +++++++++++++++++++++++++++++++++++++++++++++++++++++++++++++++++++++++++++++++++++++++++++++++  Piqray 150 mg - 1 tablet (day14) 8:30  AM     (03/06/2018)  -   FBG 132   Mg%  ->              Nov 70/30   15  u 1:30 PM   (03/06/2018)  -   CBG 229  Mg%   ->  Nov 70/30   20 u 6:00 PM   (03/06/2018)  -   CBG 187    Mg%   ->                10:00 PM    (03/06/2018)   -   CBG 151   Mg%  ->Lantus  110  u   +         Nov 70/30   25 u _____  170    Units total forThurs +++++++++++++++++++++++++++++++++++++++++++++++++++++++++++++++++++++++++++++++++++++++++++++++

## 2018-03-07 ENCOUNTER — Other Ambulatory Visit: Payer: Self-pay | Admitting: Internal Medicine

## 2018-03-07 NOTE — Progress Notes (Signed)
(02/08/2018) - FBG 357 mg% ->  Nov 70/30  15 u  6:30 pm (02/08/2018) - CBG 482 mg% ->  Nov 70/30 30 u 11:00 pm (02/08/2018) - CBG 486 mg% ->Lantus 20 u   ____65 u +++++++++++++++++++++++++++++++++++++++++++++++++++++++++++++++++++++++++ 8:30 am (02/09/2018) - FBG 331 mg% ->  Nov 70/30 45 u  6:30 pm (02/09/2018) - CBG 410 mg% ->  Nov 70/30  50 u 11:00pm (02/09/2018) -CBG426 mg% -> Lantus 20 u+ Nov 70/30  20 u  ____ 135 u  ++++++++++++++++++++++++++++++++++++++++++++++++++++++++++++++++++++++++++ 9:00 am (02/11/2018) - FBG 236mg % ->  Nov 70/30 20u 6:30 pm (02/11/2018) - CBG 329mg % ->  Nov 70/30  20u  11:00 pm (02/11/2018) - CBG428 mg% -> ->  Nov 70/30  60 u  ____100 u  ++++++++++++++++++++++++++++++++++++++++++++++++++++++++++++++++++++++++++ 9:30 am (02/12/2018) - FBG 332  mg% -> Lantus 20 u +    Nov 70/30 60 u  4:00 pm (02/12/2018) - CBG  354 mg% -> 7:00 pm (02/12/2018) - CBG 254 mg% ->  Nov 70/30  20 u  11:00 pm (02/12/2018) - CBG199mg %-> Lantus 60 u  ____ 160 u +++++++++++++++++++++++++++++++++++++++++++++++++++++++++++++++++++++++++++ 9:00 am (02/13/2018) - FBG  501mg % ->  Nov 70/30  60u 3:30pm (02/13/2018) -  10:00 pm(02/13/2018) - CBG  556 mg% -> Lantus 80u +  Nov 70/30   60u  ____200 u +++++++++++++++++++++++++++++++++++++++++++++++++++++++++++++++++++++++++++ 8:30 am (02/14/2018) - FBG 377 mg% ->  Nov 70/30  70u 12:00(02/14/2018)- CBG 451 mg%   Nov 70/30  80 u 6:00 pm (02/14/2018) - CBG 452 mg% ->  Nov 70/30  80 u 9:00 pm (02/14/2018) - CBG 428mg % -> Lantus 120 u ____350 u TOTAL for Friday  +++++++++++++++++++++++++++++++++++++++++++++++++++++++++++++++++++++++++++ 8:00 am (02/15/2018)  - FBG 288mg % ->  Nov 70/30  80 u 3:00 pm (02/15/2018) - CBG 436mg % ->  Nov 70/30  100u 7:00 pm (02/15/2018) - CBG 443 mg% ->  Nov 70/30  100 u  10:30 pm (02/15/2018) - CBG 401 mg% -> Lantus 120 u +  Nov 70/30  100 u   ____500 uTOTAL for Saturday +++++++++++++++++++++++++++++++++++++++++++++++++++++++++++++++++++++++++++ NO Piqray 8:00 am (02/16/2018) - FBG 232 mg% ->   Nov 70/30  80 u 3:00 pm (02/16/2018) - CBG 313 mg% ->   Nov 70/30  80 u  7:00 pm (02/16/2018) - CBG 184 mg% ->   Nov 70/30 20u  10:00 pm (02/16/2018) - CBG 243 mg% -> No Lantus  Nov 70/30 40 u  ____ 220 u TOTAL for Sunday ++++++++++++++++++++++++++++++++++++++++++++++++++++++++++++++++++++++++++++ 10:00 am (02/17/2018) - FBG 72 mg% ->  No Insulin  12:00 pm (02/17/2018) - CBG  202 mg% ->  No Insulin   4:00 pm (02/17/2018) - CBG  299  mg% ->at office visit -    Nov 70/30  30 u At 5:30 pm 11:00 PM (02/17/2018) - CBG  221 mg%   Nov 70/30  15 u At 11:15 pm  ____ 45 units TOTAL for Mon  +++++++++++++++++++++++++++++++++++++++++++++++++++++++++++++++++++++++++++++ 8:30Am (02/18/2018) - FBG147 mg% ->  Nov 70/30 15 u  2:00 Pm (02/18/2018) - CBG 215mg % -> Nov 70/30 15 u 7:00 Pm (02/18/2018) - CBG 194 mg% -> Nov 70/30 30 u 10:00 Pm (02/18/2018) - CBG 145 mg% ->Lantus 20 u     ____ 80Units TOTAL for Tues +++++++++++++++++++++++++++++++++++++++++++++++++++++++++++++++++++++++++++++++ 8:00 am (02/19/2018) - FBG230Mg % ->  Nov 70/3035 u 2:00 Pm (02/19/2018) - CBG 98 Mg% ->  No Insulin 7:00 Pm (02/19/2018) - CBG183Mg % ->  Nov 70/3020 u 11:00 Pm (02/19/2018) - CBG139Mg % ->Lantus 25 u    ____  80 Units TOTAL for Wed ++++++++++++++++++++++++++++++++++++++++++++++++++++++++++++++++++++++++++++++++  8:00 AM(02/20/2018) - FBG122Mg % -> Nov 70/3020u 1:00PM(02/20/2018) - CBG 71Mg % -> No Insulin 6:00 PM (02/20/2018) - CBG103Mg % -> No Insulin 10:00 PM (02/20/2018) - CBG154Mg % ->Lantus 20 u   ____40Units totalfor Thurs ++++++++++++++++++++++++++++++++++++++++++++++++++++++++++++++++++++++++++++++++ Restart Piqray 150 mg - 1 tablet  8:00  AM (02/21/2018) - FBG  176Mg %  -> Nov 70/30  15u  2:30PM(02/21/2018) - CBG 149 Mg% ->     Episode of N/V 2 hr after took [place] &Metformin at 2:30 pm  6:30 PM (02/21/2018) - CBG  261Mg %  ->  Nov 70/30  15u 10:00 PM (02/21/2018) - CBG 236Mg % ->Lantus25 u   ____ 55Units totalfor  Fri +++++++++++++++++++++++++++++++++++++++++++++++++++++++++++++++++++++++++++++++  Piqray 150 mg 1 tablet (day 2)(Metformin on hold ->) 8:00  AM  (02/22/2018)  -  FBG  145Mg % ->  Nov 70/30  10u  2:30 PM(02/22/2018) - CBG 112 Mg% ->      6:30 PM (02/22/2018)  -  CBG 213 Mg% ->  Nov 70/30  10u 10:00 PM  (02/22/2018)  -  CBG 235Mg % ->Lantus 25u   ____  45Units total for Sat ++++++++++++++++++++++++++++++++++++++++++++++++++++++++++++++++++++++++++++++++  Piqray 150 mg - 1 tablet (day3) 8:00  AM  (02/23/2018)  -  FBG  171Mg % ->    Nov 70/30   10u  2:00 PM (02/23/2018) - CBG  207Mg % ->       Nov 70/30  10u  6:00 PM  (02/23/2018)  -  CBG  191Mg % ->     Nov 70/30   10u 10:00 PM  (02/23/2018)  -  CBG  245Mg % ->Lantus35 u    Nov 70/30  10u      _____  75Units total  forSun +++++++++++++++++++++++++++++++++++++++++++++++++++++++++++++++++++++++++++++++++  Piqray 150 mg - 1 tablet (day4) 8:00  AM  (02/24/2018)  -  FBG  148 Mg% ->         2:30 PM (02/24/2018)  -  CBG  274Mg % ->      Nov 70/30   30u  6:00 PM  (02/24/2018)  -  CBG  347Mg % ->     Nov 70/30  30u 10:00 PM  (02/24/2018)  -  CBG  419Mg % ->Lantus60u      Nov 70/30  20u    _____ 140Units total forMon +++++++++++++++++++++++++++++++++++++++++++++++++++++++++++++++++++++++++++++++++  Piqray 150 mg - 1 tablet (day5) 8:00  AM  (02/25/2018)  -  FBG 169 Mg% ->      Nov 70/30 30u  2:30 PM (02/25/2018)  -  CBG  176Mg % ->       Nov 70/30  30u  6:00 PM  (02/25/2018)  -  CBG  251 Mg% ->    Nov 70/30  30u 10:00  PM  (02/25/2018)   -  CBG  286Mg % -> Lantus 80u    Nov 70/30  30 u  _____ 200Units total  forTues +++++++++++++++++++++++++++++++++++++++++++++++++++++++++++++++++++++++++++++++++  Piqray 150 mg - 1 tablet (day6) 8:00  AM  (02/26/2018)  - FBG 93 Mg%  ->   Nov 70/30 25u 2:30 PM  (02/26/2018)  -  CBG  265 Mg% ->       Nov 70/30 40u 6:00 PM   (02/26/2018)  - CBG  313 Mg% ->     Nov 70/30  50u 10:00 PM   (02/26/2018)   -   CBG  389Mg % -> Lantus100 u    Nov 70/30 40u _____ 255Units total forWed +++++++++++++++++++++++++++++++++++++++++++++++++++++++++++++++++++++++++++++++++  Piqray 150 mg - 1 tablet (day7)  8:00  AM  (02/27/2018)  -  FBG  293Mg %  ->  Nov 70/30  60 u 1:30 PM  (02/27/2018)  -   CBG   117Mg %  ->        Nov 70/30 40u 5:30  PM   (02/27/2018)  -  CBG    81Mg %  ->        10:00 PM   (02/27/2018)   -   CBG   92Mg %  -> Lantus 20 u       _____  120Units total  forThurs +++++++++++++++++++++++++++++++++++++++++++++++++++++++++++++++++++++++++++++++++  Piqray 150 mg - 1 tablet (day8) 8:00  AM  (02/28/2018)  -  FBG  121Mg %   ->    Nov 70/30 10u 1:30 PM  (02/28/2018)  -   CBG  237Mg %  ->     Nov 70/30 25u 5:30  PM   (02/28/2018)  -  CBG   269 Mg%   ->     Nov 70/30 35u 10:00 PM   (02/28/2018)   -   CBG  267 Mg%   ->Lantus50u       Nov 70/30 15u     _____  135Units total forFri +++++++++++++++++++++++++++++++++++++++++++++++++++++++++++++++++++++++++++++++++  Piqray 150 mg - 1 tablet (day9) 8:30  AM  (03/01/2018)  -  FBG         307Mg %       ->   Nov 70/30   40u 1:30 PM  (03/01/2018)  -   CBG        157  Mg%      ->   Nov 70/30  20 u 5:30  PM   (03/01/2018)  -  CBG      139   Mg%        ->   Nov 70/30   25  u 10:00 PM   (03/01/2018)   -   CBG      193  Mg%       ->Lantus 70u      Nov 70/30  10  u        _____   165   Units total  forSat ++++++++++++++++++++++++++++++++++++++++++++++++++++++  Piqray 150 mg - 1 tablet (day10) 8:30  AM  (03/02/2018)  -   FBG  286Mg %   ->    Nov 70/30 35u 1:30 PM  (03/02/2018)  -   CBG  306 Mg%   ->    Nov 70/30 60u 5:30  PM   (03/02/2018)  -   CBG   359 Mg%   ->    Nov 70/30 60u 10:00 PM   (03/02/2018)   -   CBG   330Mg %  ->Lantus100u+ Nov 70/30 60u   _____  315Units total forSun +++++++++++++++++++++++++++++++++++++++++++++++++++++++++++++++++++++++++++++++++++++++++++++++  Piqray 150 mg - 1 tablet (day11) 8:30  AM  (03/03/2018)  -   FBG  121Mg %  ->      Nov 70/30 20u 1:30 PM  (03/03/2018)  -   CBG   112 Mg%   ->      Nov 70/30 20u 5:30 PM  (03/03/2018)  -   CBG   219Mg %   ->    Nov 70/30 35u 10:00 PM   (03/03/2018)   -   CBG   257Mg %  ->Lantus 120u + Nov 70/30 40u  _____  235Units total  forMon +++++++++++++++++++++++++++++++++++++++++++++++++++++++++++++++++++++++++++++++++++++++++++++++  Piqray 150 mg - 1 tablet (day12) 8:30  AM  (03/04/2018)  -   FBG  112Mg %  ->       Nov 70/30 20u 1:30 PM  (03/04/2018)  -  CBG     97 Mg%   ->         5:30 PM  (03/04/2018)  -   CBG   198Mg %   ->      Nov 70/30 10u 10:00 PM   (03/04/2018)   -   CBG   211Mg %  ->Lantus 110  u + Nov 70/30 10u  _____  150Units total forTues +++++++++++++++++++++++++++++++++++++++++++++++++++++++++++++++++++++++++++++++++++++++++++++++  Piqray 150 mg - 1 tablet (day13) 8:30  AM   (03/05/2018)  -   FBG   126   Mg%  ->      Nov 70/30   20 u 1:30 PM   (03/05/2018)  -   CBG    82   Mg%   ->        6:00 PM   (03/05/2018)  -   CBG   188    Mg%   ->     Nov 70/30   20 u 10:00 PM    (03/05/2018)   -   CBG    207    Mg%  ->Lantus 110   u +   Nov 70/30 10 u  _____ 160   Units total  forWed +++++++++++++++++++++++++++++++++++++++++++++++++++++++++++++++++++++++++++++++++++++++++++++++  Piqray 150 mg - 1 tablet (day14) 8:30  AM    (03/06/2018)  -   FBG 132   Mg%  ->              Nov 70/30   15  u 1:30 PM   (03/06/2018)  -   CBG 229  Mg%   ->                Nov 70/30   20 u 6:00 PM   (03/06/2018)  -   CBG 187    Mg%   ->                 10:00 PM    (03/06/2018)   -   CBG 151   Mg%  ->Lantus  110  u +            Nov 70/30   25 u           _____ 170    Units total forThurs +++++++++++++++++++++++++++++++++++++++++++++++++++++++++++++++++++++++++++++++++++++++++++++++  Piqray 150 mg - 1 tablet (day14) 8:30  AM    (03/07/2018)  -   FBG   235   Mg%   ->               Nov 70/30   25  u 1:30 PM   (03/07/2018)  -   CBG  202    Mg%   ->                Nov 70/30  25   u 6:00 PM   (03/07/2018)  -   CBG   190    Mg%   ->                 Nov 70/30     25  u 10:00 PM    (03/07/2018)   -   CBG   185     Mg%  ->Lantus 110 u   +                               _____ 185    Units  total forFri +++++++++++++++++++++++++++++++++++++++++++++++++++++++++++++++++++++++++++++++++++++++++++++++

## 2018-03-08 ENCOUNTER — Other Ambulatory Visit: Payer: Self-pay | Admitting: Internal Medicine

## 2018-03-08 NOTE — Progress Notes (Signed)
(02/08/2018) - FBG 357 mg% ->  Nov 70/30  15 u  6:30 pm (02/08/2018) - CBG 482 mg% ->  Nov 70/30 30 u 11:00 pm (02/08/2018) - CBG 486 mg% ->Lantus 20 u   ____65 u +++++++++++++++++++++++++++++++++++++++++++++++++++++++++++++++++++++++++ 8:30 am (02/09/2018) - FBG 331 mg% ->  Nov 70/30 45 u  6:30 pm (02/09/2018) - CBG 410 mg% ->  Nov 70/30  50 u 11:00pm (02/09/2018) -CBG426 mg% -> Lantus 20 u+ Nov 70/30  20 u  ____ 135 u  ++++++++++++++++++++++++++++++++++++++++++++++++++++++++++++++++++++++++++ 9:00 am (02/11/2018) - FBG 236mg % ->  Nov 70/30 20u 6:30 pm (02/11/2018) - CBG 329mg % ->  Nov 70/30  20u  11:00 pm (02/11/2018) - CBG428 mg% -> ->  Nov 70/30  60 u  ____100 u  ++++++++++++++++++++++++++++++++++++++++++++++++++++++++++++++++++++++++++ 9:30 am (02/12/2018) - FBG 332  mg% -> Lantus 20 u +    Nov 70/30 60 u  4:00 pm (02/12/2018) - CBG  354 mg% -> 7:00 pm (02/12/2018) - CBG 254 mg% ->  Nov 70/30  20 u  11:00 pm (02/12/2018) - CBG199mg %-> Lantus 60 u  ____ 160 u +++++++++++++++++++++++++++++++++++++++++++++++++++++++++++++++++++++++++++ 9:00 am (02/13/2018) - FBG  501mg % ->  Nov 70/30  60u 3:30pm (02/13/2018) -  10:00 pm(02/13/2018) - CBG  556 mg% -> Lantus 80u +  Nov 70/30   60u  ____200 u +++++++++++++++++++++++++++++++++++++++++++++++++++++++++++++++++++++++++++ 8:30 am (02/14/2018) - FBG 377 mg% ->  Nov 70/30  70u 12:00(02/14/2018)- CBG 451 mg%   Nov 70/30  80 u 6:00 pm (02/14/2018) - CBG 452 mg% ->  Nov 70/30  80 u 9:00 pm (02/14/2018) - CBG 428mg % -> Lantus 120 u ____350 u TOTAL for Friday  +++++++++++++++++++++++++++++++++++++++++++++++++++++++++++++++++++++++++++ 8:00 am (02/15/2018)  - FBG 288mg % ->  Nov 70/30  80 u 3:00 pm (02/15/2018) - CBG 436mg % ->  Nov 70/30  100u 7:00 pm (02/15/2018) - CBG 443 mg% ->  Nov 70/30  100 u  10:30 pm (02/15/2018) - CBG 401 mg% -> Lantus 120 u +  Nov 70/30  100 u   ____500 uTOTAL for Saturday +++++++++++++++++++++++++++++++++++++++++++++++++++++++++++++++++++++++++++ NO Piqray 8:00 am (02/16/2018) - FBG 232 mg% ->   Nov 70/30  80 u 3:00 pm (02/16/2018) - CBG 313 mg% ->   Nov 70/30  80 u  7:00 pm (02/16/2018) - CBG 184 mg% ->   Nov 70/30 20u  10:00 pm (02/16/2018) - CBG 243 mg% -> No Lantus  Nov 70/30 40 u  ____ 220 u TOTAL for Sunday ++++++++++++++++++++++++++++++++++++++++++++++++++++++++++++++++++++++++++++ 10:00 am (02/17/2018) - FBG 72 mg% ->  No Insulin  12:00 pm (02/17/2018) - CBG  202 mg% ->  No Insulin   4:00 pm (02/17/2018) - CBG  299  mg% ->at office visit -    Nov 70/30  30 u At 5:30 pm 11:00 PM (02/17/2018) - CBG  221 mg%   Nov 70/30  15 u At 11:15 pm  ____ 45 units TOTAL for Mon  +++++++++++++++++++++++++++++++++++++++++++++++++++++++++++++++++++++++++++++ 8:30Am (02/18/2018) - FBG147 mg% ->  Nov 70/30 15 u  2:00 Pm (02/18/2018) - CBG 215mg % -> Nov 70/30 15 u 7:00 Pm (02/18/2018) - CBG 194 mg% -> Nov 70/30 30 u 10:00 Pm (02/18/2018) - CBG 145 mg% ->Lantus 20 u     ____ 80Units TOTAL for Tues +++++++++++++++++++++++++++++++++++++++++++++++++++++++++++++++++++++++++++++++ 8:00 am (02/19/2018) - FBG230Mg % ->  Nov 70/3035 u 2:00 Pm (02/19/2018) - CBG 98 Mg% ->  No Insulin 7:00 Pm (02/19/2018) - CBG183Mg % ->  Nov 70/3020 u 11:00 Pm (02/19/2018) - CBG139Mg % ->Lantus 25 u    ____  80 Units TOTAL for Wed ++++++++++++++++++++++++++++++++++++++++++++++++++++++++++++++++++++++++++++++++  8:00 AM(02/20/2018) - FBG122Mg % -> Nov 70/3020u 1:00PM(02/20/2018) - CBG 71Mg % -> No Insulin 6:00 PM (02/20/2018) - CBG103Mg % -> No Insulin 10:00 PM (02/20/2018) - CBG154Mg % ->Lantus 20 u   ____40Units totalfor Thurs ++++++++++++++++++++++++++++++++++++++++++++++++++++++++++++++++++++++++++++++++ Restart Piqray 150 mg - 1 tablet  8:00  AM (02/21/2018) - FBG  176Mg %  -> Nov 70/30  15u  2:30PM(02/21/2018) - CBG 149 Mg% ->     Episode of N/V 2 hr after took [place] &Metformin at 2:30 pm  6:30 PM (02/21/2018) - CBG  261Mg %  ->  Nov 70/30  15u 10:00 PM (02/21/2018) - CBG 236Mg % ->Lantus25 u   ____ 55Units totalfor  Fri +++++++++++++++++++++++++++++++++++++++++++++++++++++++++++++++++++++++++++++++  Piqray 150 mg 1 tablet (day 2)(Metformin on hold ->) 8:00  AM  (02/22/2018)  -  FBG  145Mg % ->  Nov 70/30  10u  2:30 PM(02/22/2018) - CBG 112 Mg% ->      6:30 PM (02/22/2018)  -  CBG 213 Mg% ->  Nov 70/30  10u 10:00 PM  (02/22/2018)  -  CBG 235Mg % ->Lantus 25u   ____  45Units total for Sat ++++++++++++++++++++++++++++++++++++++++++++++++++++++++++++++++++++++++++++++++  Piqray 150 mg - 1 tablet (day3) 8:00  AM  (02/23/2018)  -  FBG  171Mg % ->  Nov 70/30   10u  2:00 PM (02/23/2018) - CBG  207Mg % ->     Nov 70/30  10u  6:00 PM  (02/23/2018)  -  CBG  191Mg % ->   Nov 70/30   10u 10:00 PM  (02/23/2018)  -  CBG  245Mg % ->Lantus35 u  Nov 70/30  10u  _____  75Units total  forSun +++++++++++++++++++++++++++++++++++++++++++++++++++++++++++++++++++++++++++++++++  Piqray 150 mg - 1 tablet (day4) 8:00  AM  (02/24/2018)  -  FBG  148 Mg% ->       2:30 PM (02/24/2018)  -  CBG  274Mg % ->    Nov 70/30   30u  6:00 PM  (02/24/2018)  -  CBG  347Mg % ->   Nov 70/30  30u 10:00 PM  (02/24/2018)  -  CBG  419Mg % ->Lantus60u   Nov 70/30  20u  _____ 140Units total forMon +++++++++++++++++++++++++++++++++++++++++++++++++++++++++++++++++++++++++++++++++  Piqray 150 mg - 1 tablet (day5) 8:00  AM  (02/25/2018)  -  FBG 169 Mg% ->      Nov 70/30 30u  2:30 PM (02/25/2018)  -  CBG  176Mg % ->       Nov 70/30  30u  6:00 PM  (02/25/2018)  -  CBG  251 Mg% ->   Nov 70/30  30u 10:00  PM  (02/25/2018)   -  CBG  286Mg % -> Lantus 80u  Nov 70/30  30 u  _____ 200Units total  forTues +++++++++++++++++++++++++++++++++++++++++++++++++++++++++++++++++++++++++++++++++  Piqray 150 mg - 1 tablet (day6) 8:00  AM  (02/26/2018)  - FBG 93 Mg%  ->   Nov 70/30 25u 2:30 PM  (02/26/2018)  -  CBG  265 Mg% ->      Nov 70/30 40u 6:00 PM   (02/26/2018)  - CBG  313 Mg% ->    Nov 70/30  50u 10:00 PM   (02/26/2018)   -   CBG  389Mg % -> Lantus100 u Nov 70/30 40u _____ 255Units total forWed +++++++++++++++++++++++++++++++++++++++++++++++++++++++++++++++++++++++++++++++++  Piqray 150 mg - 1 tablet (day7)  8:00  AM  (02/27/2018)  -  FBG  293Mg %  ->     Nov 70/30  60 u 1:30 PM  (02/27/2018)  -   CBG   117Mg %  ->     Nov 70/30 40u 5:30  PM   (  02/27/2018)  -  CBG    81Mg %  ->     10:00 PM   (02/27/2018)   -   CBG   92Mg %  -> Lantus 20 u   _____  120Units total  forThurs +++++++++++++++++++++++++++++++++++++++++++++++++++++++++++++++++++++++++++++++++  Piqray 150 mg - 1 tablet (day8) 8:00  AM  (02/28/2018)  -  FBG  121Mg %   ->    Nov 70/30 10u 1:30 PM  (02/28/2018)  -   CBG  237Mg %  ->     Nov 70/30 25u 5:30  PM   (02/28/2018)  -  CBG   269 Mg%   ->     Nov 70/30 35u 10:00 PM   (02/28/2018)   -   CBG  267Mg %   ->Lantus50u   Nov 70/30 15u   _____  135Units total forFri +++++++++++++++++++++++++++++++++++++++++++++++++++++++++++++++++++++++++++++++++  Piqray 150 mg - 1 tablet (day9) 8:30  AM  (03/01/2018)  -  FBG  307Mg %   ->   Nov 70/30 40u 1:30 PM  (03/01/2018)  -   CBG  157Mg %   ->   Nov 70/30 20u 5:30  PM   (03/01/2018)  -  CBG   139Mg %   ->   Nov 70/30 25u 10:00 PM   (03/01/2018)   -   CBG  193Mg %  ->Lantus70u  Nov 70/30 10u  _____  165Units total  forSat ++++++++++++++++++++++++++++++++++++++++++++++++++++++  Piqray 150 mg - 1 tablet (day10) 8:30  AM  (03/02/2018)  -   FBG  286Mg %   ->    Nov 70/30 35u 1:30 PM  (03/02/2018)  -   CBG  306 Mg%   ->    Nov 70/30 60u 5:30  PM   (03/02/2018)  -   CBG   359 Mg%   ->    Nov 70/30 60u 10:00 PM   (03/02/2018)   -   CBG   330Mg %  ->Lantus100u+ Nov 70/30 60u   _____  315Units total forSun +++++++++++++++++++++++++++++++++++++++++++++++++++++++++++++++++++++++++++++++++++++++++++++++  Piqray 150 mg - 1 tablet (day11) 8:30  AM  (03/03/2018)  -   FBG  121Mg %  ->     Nov 70/30 20u 1:30 PM  (03/03/2018)  -   CBG   112 Mg%   ->     Nov 70/30 20u 5:30 PM  (03/03/2018)  -   CBG   219Mg %   ->    Nov 70/30 35u 10:00 PM   (03/03/2018)   -   CBG   257Mg %  ->Lantus 120u + Nov 70/30 40u  _____  235Units total  forMon +++++++++++++++++++++++++++++++++++++++++++++++++++++++++++++++++++++++++++++++++++++++++++++++  Piqray 150 mg - 1 tablet (day12) 8:30  AM  (03/04/2018)  -   FBG  112Mg %  ->   Nov 70/30 20u 1:30 PM  (03/04/2018)  -   CBG    97 Mg%   ->     5:30 PM  (03/04/2018)  -   CBG   198Mg %   ->    Nov 70/30 10u 10:00 PM   (03/04/2018)   -   CBG   211Mg %  ->Lantus 110 u + Nov 70/30 10u  _____  150Units total forTues +++++++++++++++++++++++++++++++++++++++++++++++++++++++++++++++++++++++++++++++++++++++++++++++  Piqray 150 mg - 1 tablet (day13) 8:30  AM  (03/05/2018)  -   FBG   126  Mg%  ->    Nov 70/30  20 u 1:30 PM  (03/05/2018)  -   CBG    82 Mg%   ->     6:00 PM  (03/05/2018)  -  CBG   188 Mg%   ->     Nov 70/30   20 u 10:00 PM    (03/05/2018)   -   CBG   207 Mg%  ->Lantus 110 u + Nov 70/30   10 u  _____ 160 Units total  forWed +++++++++++++++++++++++++++++++++++++++++++++++++++++++++++++++++++++++++++++++++++++++++++++++  Piqray 150 mg - 1 tablet (day14) 8:30  AM   (03/06/2018)  -   FBG 132  Mg%  ->   Nov 70/30  15 u 1:30 PM  (03/06/2018)  -   CBG 229 Mg%   ->     Nov 70/30  20  u 6:00 PM  (03/06/2018)  -   CBG 187Mg %   ->       10:00 PM    (03/06/2018)   -   CBG 151 Mg%  ->Lantus 110u +    Nov 70/30   25 u      _____ 170Units total forThurs +++++++++++++++++++++++++++++++++++++++++++++++++++++++++++++++++++++++++++++++++++++++++++++++  Piqray 150 mg - 1 tablet (day15) 8:30  AM   (03/07/2018)  -   FBG   235  Mg%   ->    Nov 70/30  25u 1:30 PM  (03/07/2018)  -   CBG  202   Mg%   ->     Nov 70/30  25  u 6:00 PM  (03/07/2018)  -   CBG   190   Mg%   ->           Nov 70/30     25  u 10:00 PM    (03/07/2018)   -   CBG   185   Mg%  ->Lantus 110u   +                           _____ 185Units total  forFri +++++++++++++++++++++++++++++++++++++++++++++++++++++++++++++++++++++++++++++++++++++++++++++++  Piqray 150 mg - 1 tablet (day16) 8:30  AM   (03/08/2018)  -   FBG   120    Mg%   ->   Nov 70/30  15  u 1:30 PM  (03/08/2018)  -   CBG            Mg%   ->              6:00 PM  (03/08/2018)  -   CBG             Mg%   ->                   10:00 PM    (03/08/2018)   -   CBG            Mg%  ->Lantus 110u   +                           _____  125 Units total for Sat ++++++++++++++++++++++++++++++++++++++++++++++++++++++++++++++++++++++++++++++++++++++++++++++++  Piqray 150 mg - 1 tablet (day17) 8:30  AM   (03/09/2018)  -   FBG             Mg%   ->    Nov 70/30         1:30 PM  (03/09/2018)  -   CBG            Mg%   ->     Nov 70/30         6:00 PM  (03/09/2018)  -   CBG  Mg%   ->           Nov  70/30           10:00 PM    (03/09/2018)   -   CBG            Mg%  ->Lantus         u   +       Nov 70/30                        _____        Units total for Sun

## 2018-03-09 ENCOUNTER — Other Ambulatory Visit: Payer: Self-pay | Admitting: Internal Medicine

## 2018-03-09 MED FILL — PROAIR HFA 90 MCG INHALER: 108 (90 BAS | 50 days supply | Qty: 9 | Fill #1

## 2018-03-09 NOTE — Progress Notes (Unsigned)
(02/08/2018) - FBG 357 mg% ->  Nov 70/30  15 u  6:30 pm (02/08/2018) - CBG 482 mg% ->  Nov 70/30 30 u 11:00 pm (02/08/2018) - CBG 486 mg% ->Lantus 20 u   ____65 u +++++++++++++++++++++++++++++++++++++++++++++++++++++++++++++++++++++++++ 8:30 am (02/09/2018) - FBG 331 mg% ->  Nov 70/30 45 u  6:30 pm (02/09/2018) - CBG 410 mg% ->  Nov 70/30  50 u 11:00pm (02/09/2018) -CBG426 mg% -> Lantus 20 u+ Nov 70/30  20 u  ____ 135 u  ++++++++++++++++++++++++++++++++++++++++++++++++++++++++++++++++++++++++++ 9:00 am (02/11/2018) - FBG 236mg % ->  Nov 70/30 20u 6:30 pm (02/11/2018) - CBG 329mg % ->  Nov 70/30  20u  11:00 pm (02/11/2018) - CBG428 mg% -> ->  Nov 70/30  60 u  ____100 u  ++++++++++++++++++++++++++++++++++++++++++++++++++++++++++++++++++++++++++ 9:30 am (02/12/2018) - FBG 332  mg% -> Lantus 20 u +    Nov 70/30 60 u  4:00 pm (02/12/2018) - CBG  354 mg% -> 7:00 pm (02/12/2018) - CBG 254 mg% ->  Nov 70/30  20 u  11:00 pm (02/12/2018) - CBG199mg %-> Lantus 60 u  ____ 160 u +++++++++++++++++++++++++++++++++++++++++++++++++++++++++++++++++++++++++++ 9:00 am (02/13/2018) - FBG  501mg % ->  Nov 70/30  60u 3:30pm (02/13/2018) -  10:00 pm(02/13/2018) - CBG  556 mg% -> Lantus 80u +  Nov 70/30   60u  ____200 u +++++++++++++++++++++++++++++++++++++++++++++++++++++++++++++++++++++++++++ 8:30 am (02/14/2018) - FBG 377 mg% ->  Nov 70/30  70u 12:00(02/14/2018)- CBG 451 mg%   Nov 70/30  80 u 6:00 pm (02/14/2018) - CBG 452 mg% ->  Nov 70/30  80 u 9:00 pm (02/14/2018) - CBG 428mg % -> Lantus 120 u ____350 u TOTAL for Friday  +++++++++++++++++++++++++++++++++++++++++++++++++++++++++++++++++++++++++++ 8:00 am (02/15/2018)  - FBG 288mg % ->  Nov 70/30  80 u 3:00 pm (02/15/2018) - CBG 436mg % ->  Nov 70/30  100u 7:00 pm (02/15/2018) - CBG 443 mg% ->  Nov 70/30  100 u  10:30 pm (02/15/2018) - CBG 401 mg% -> Lantus 120 u +  Nov 70/30  100 u   ____500 uTOTAL for Saturday +++++++++++++++++++++++++++++++++++++++++++++++++++++++++++++++++++++++++++ NO Piqray 8:00 am (02/16/2018) - FBG 232 mg% ->   Nov 70/30  80 u 3:00 pm (02/16/2018) - CBG 313 mg% ->   Nov 70/30  80 u       7:00 pm (02/16/2018) - CBG 184 mg% ->   Nov 70/30 20u                      10:00 pm (02/16/2018) - CBG 243 mg% -> No Lantus  Nov 70/30 40 u  ____ 220 u TOTAL for Sunday ++++++++++++++++++++++++++++++++++++++++++++++++++++++++++++++++++++++++++++ 10:00 am (02/17/2018) - FBG 72 mg% ->  No Insulin  12:00 pm (02/17/2018) - CBG  202 mg% ->  No Insulin   4:00 pm (02/17/2018) - CBG  299 mg% ->at office visit -     Nov 70/30  30 u At 5:30 pm 11:00 PM (02/17/2018) - CBG  221 mg%   Nov 70/30  15 u At 11:15 pm  ____ 45 units TOTAL for Mon  +++++++++++++++++++++++++++++++++++++++++++++++++++++++++++++++++++++++++++++ 8:30Am (02/18/2018) - FBG147 mg% ->  Nov 70/30 15 u  2:00 Pm (02/18/2018) - CBG 215mg % -> Nov 70/30 15 u 7:00 Pm (02/18/2018) - CBG 194 mg% -> Nov 70/30 30 u 10:00 Pm (02/18/2018) - CBG 145 mg% ->Lantus 20 u     ____ 80Units TOTAL for Tues +++++++++++++++++++++++++++++++++++++++++++++++++++++++++++++++++++++++++++++++ 8:00 am (02/19/2018) - FBG230Mg % ->  Nov 70/3035 u 2:00 Pm (02/19/2018) - CBG 98 Mg% ->  No Insulin 7:00 Pm (02/19/2018) -  CBG183Mg % ->  Nov 70/3020 u 11:00 Pm (02/19/2018) - CBG139Mg % ->Lantus 25 u   ____  80  Units TOTAL for Wed ++++++++++++++++++++++++++++++++++++++++++++++++++++++++++++++++++++++++++++++++ 8:00 AM(02/20/2018) - FBG122Mg % -> Nov 70/3020u 1:00PM(02/20/2018) - CBG 71Mg % -> No Insulin 6:00 PM (02/20/2018) - CBG103Mg % -> No Insulin 10:00 PM (02/20/2018) - CBG154Mg % ->Lantus 20 u   ____40Units totalfor Thurs ++++++++++++++++++++++++++++++++++++++++++++++++++++++++++++++++++++++++++++++++ Restart Piqray 150 mg - 1 tablet  8:00  AM (02/21/2018) - FBG  176Mg %  -> Nov 70/30  15u  2:30PM(02/21/2018) - CBG 149 Mg% ->     Episode of N/V 2 hr after took [place] &Metformin at 2:30 pm  6:30 PM (02/21/2018) - CBG  261Mg %  ->  Nov 70/30  15u 10:00 PM (02/21/2018) - CBG 236Mg % ->Lantus25 u   ____ 55Units totalfor  Fri +++++++++++++++++++++++++++++++++++++++++++++++++++++++++++++++++++++++++++++++  Piqray 150 mg 1 tablet (day 2)(Metformin on hold ->) 8:00  AM  (02/22/2018)  -  FBG  145Mg % ->  Nov 70/30  10u  2:30 PM(02/22/2018) - CBG 112 Mg% ->      6:30 PM (02/22/2018)  -  CBG 213 Mg% ->  Nov 70/30  10u 10:00 PM  (02/22/2018)  -  CBG 235Mg % ->Lantus 25u   ____  45Units total for Sat ++++++++++++++++++++++++++++++++++++++++++++++++++++++++++++++++++++++++++++++++  Piqray 150 mg - 1 tablet (day3) 8:00  AM  (02/23/2018)  -  FBG  171Mg % ->  Nov 70/30   10u  2:00 PM (02/23/2018) - CBG  207Mg % ->     Nov 70/30  10u  6:00 PM  (02/23/2018)  -  CBG  191Mg % ->   Nov 70/30   10u 10:00 PM  (02/23/2018)  -  CBG  245Mg % ->Lantus35 u  Nov 70/30  10u  _____  75Units total  forSun +++++++++++++++++++++++++++++++++++++++++++++++++++++++++++++++++++++++++++++++++  Piqray 150 mg - 1 tablet (day4) 8:00  AM  (02/24/2018)  -  FBG  148 Mg% ->       2:30 PM (02/24/2018)  -  CBG  274Mg % ->    Nov 70/30   30u  6:00 PM  (02/24/2018)  -  CBG  347Mg % ->   Nov 70/30  30u 10:00 PM  (02/24/2018)  -  CBG  419Mg % ->Lantus60u   Nov 70/30  20u  _____ 140Units total forMon +++++++++++++++++++++++++++++++++++++++++++++++++++++++++++++++++++++++++++++++++  Piqray 150 mg - 1 tablet (day5) 8:00  AM  (02/25/2018)  -  FBG 169 Mg% ->      Nov 70/30 30u  2:30 PM (02/25/2018)  -  CBG  176Mg % ->       Nov 70/30  30u  6:00 PM  (02/25/2018)  -  CBG  251 Mg% ->   Nov 70/30  30u 10:00  PM  (02/25/2018)   -  CBG  286Mg % -> Lantus 80u  Nov 70/30  30 u  _____ 200Units total  forTues +++++++++++++++++++++++++++++++++++++++++++++++++++++++++++++++++++++++++++++++++  Piqray 150 mg - 1 tablet (day6) 8:00  AM  (02/26/2018)  - FBG 93 Mg%  ->   Nov 70/30 25u 2:30 PM  (02/26/2018)  -  CBG  265 Mg% ->      Nov 70/30 40u 6:00 PM   (02/26/2018)  - CBG  313 Mg% ->    Nov 70/30  50u 10:00 PM   (02/26/2018)   -   CBG  389Mg % -> Lantus100 u Nov 70/30 40u _____ 255Units total forWed +++++++++++++++++++++++++++++++++++++++++++++++++++++++++++++++++++++++++++++++++  Piqray 150 mg - 1 tablet (day7)  8:00  AM  (02/27/2018)  -  FBG  293Mg %  ->     Nov 70/30  60  u 1:30 PM  (02/27/2018)  -   CBG   117Mg %  ->     Nov 70/30 40u 5:30  PM   (02/27/2018)  -  CBG    81Mg %  ->     10:00 PM   (02/27/2018)   -   CBG   92Mg %  -> Lantus 20 u   _____  120Units total  forThurs +++++++++++++++++++++++++++++++++++++++++++++++++++++++++++++++++++++++++++++++++  Piqray 150 mg - 1 tablet (day8) 8:00  AM  (02/28/2018)  -  FBG  121Mg %   ->    Nov 70/30 10u 1:30 PM  (02/28/2018)  -   CBG  237Mg %  ->     Nov 70/30 25u 5:30  PM   (02/28/2018)  -  CBG   269 Mg%   ->     Nov 70/30 35u 10:00 PM   (02/28/2018)   -   CBG  267Mg %   ->Lantus50u   Nov 70/30 15u   _____  135Units total forFri +++++++++++++++++++++++++++++++++++++++++++++++++++++++++++++++++++++++++++++++++  Piqray 150 mg - 1 tablet (day9) 8:30  AM  (03/01/2018)  -  FBG  307Mg %   ->   Nov 70/30 40u 1:30 PM  (03/01/2018)  -   CBG  157Mg %   ->   Nov 70/30 20u 5:30  PM   (03/01/2018)  -  CBG   139Mg %   ->   Nov 70/30 25u 10:00 PM   (03/01/2018)   -   CBG  193Mg %  ->Lantus70u  Nov 70/30 10u  _____  165Units total  forSat ++++++++++++++++++++++++++++++++++++++++++++++++++++++  Piqray 150 mg - 1 tablet (day10) 8:30  AM  (03/02/2018)  -   FBG  286Mg %   ->    Nov 70/30 35u 1:30 PM  (03/02/2018)  -   CBG  306 Mg%   ->    Nov 70/30 60u 5:30  PM   (03/02/2018)  -   CBG   359 Mg%   ->    Nov 70/30 60u 10:00 PM   (03/02/2018)   -   CBG   330Mg %  ->Lantus100u+ Nov 70/30 60u   _____  315Units total forSun +++++++++++++++++++++++++++++++++++++++++++++++++++++++++++++++++++++++++++++++++++++++++++++++  Piqray 150 mg - 1 tablet (day11) 8:30  AM  (03/03/2018)  -   FBG  121Mg %  ->     Nov 70/30 20u 1:30 PM  (03/03/2018)  -   CBG   112 Mg%   ->     Nov 70/30 20u 5:30 PM  (03/03/2018)  -   CBG   219Mg %   ->    Nov 70/30 35u 10:00 PM   (03/03/2018)   -   CBG   257Mg %  ->Lantus 120u + Nov 70/30 40u  _____  235Units total  forMon +++++++++++++++++++++++++++++++++++++++++++++++++++++++++++++++++++++++++++++++++++++++++++++++  Piqray 150 mg - 1 tablet (day12) 8:30  AM  (03/04/2018)  -   FBG  112Mg %  ->   Nov 70/30 20u 1:30 PM  (03/04/2018)  -   CBG    97 Mg%   ->     5:30 PM  (03/04/2018)  -   CBG   198Mg %   ->    Nov 70/30 10u 10:00 PM   (03/04/2018)   -   CBG   211Mg %  ->Lantus 110 u + Nov 70/30 10u  _____  150Units total forTues +++++++++++++++++++++++++++++++++++++++++++++++++++++++++++++++++++++++++++++++++++++++++++++++  Piqray 150 mg - 1 tablet (day13) 8:30  AM  (03/05/2018)  -   FBG   126  Mg%  ->    Nov 70/30  20 u 1:30  PM  (03/05/2018)  -   CBG    82 Mg%   ->     6:00 PM  (03/05/2018)  -   CBG   188 Mg%   ->     Nov 70/30   20 u 10:00 PM    (03/05/2018)   -   CBG   207 Mg%  ->Lantus 110 u + Nov 70/30   10 u  _____ 160 Units total  forWed +++++++++++++++++++++++++++++++++++++++++++++++++++++++++++++++++++++++++++++++++++++++++++++++  Piqray 150 mg - 1 tablet (day14) 8:30  AM   (03/06/2018)  -   FBG 132  Mg%  ->   Nov 70/30  15 u 1:30 PM  (03/06/2018)  -   CBG 229 Mg%   ->    Nov 70/30  20  u 6:00 PM  (03/06/2018)  -   CBG 187Mg %   ->      10:00 PM    (03/06/2018)   -   CBG 151 Mg%  ->Lantus 110u + Nov 70/30   25 u  _____ 170Units total forThurs +++++++++++++++++++++++++++++++++++++++++++++++++++++++++++++++++++++++++++++++++++++++++++++++  Piqray 150 mg - 1 tablet (day15) 8:30  AM   (03/07/2018)  -   FBG  235Mg %   ->    Nov 70/30  25u 1:30 PM  (03/07/2018)  -   CBG  202Mg %   ->    Nov 70/30  25u 6:00 PM  (03/07/2018)  -   CBG  190Mg %   ->     Nov 70/3025u 10:00 PM    (03/07/2018)   -   CBG  185Mg %  ->Lantus 110u  +   _____ 185Units total  forFri +++++++++++++++++++++++++++++++++++++++++++++++++++++++++++++++++++++++++++++++++++++++++++++++  Piqray 150 mg - 1 tablet (day16 8:30  AM   (03/08/2018)  -   FBG  120  Mg%   ->   Nov 70/30  15 u 1:30 PM  (03/08/2018)  -   CBG   105 Mg%   ->           6:00 PM  (03/08/2018)  -   CBG   103  Mg%   ->          10:00 PM    (03/08/2018)   -   CBG    188Mg %  ->Lantus 110u  +     _____  125 Units total for Sat ++++++++++++++++++++++++++++++++++++++++++++++++++++++++++++++++++++++++++++++++++++++++++++++++  Piqray 150 mg - 1 tablet (day17) 8:30  AM   (03/09/2018)  -   FBG   140  Mg%   ->   Nov 70/30    15 u 1:30 PM  (03/09/2018)  -    CBG          Mg%   ->    Nov 70/30       6:00 PM  (03/09/2018)  -    CBG          Mg%   ->    Nov  70/30      10:00 PM    (03/09/2018)   -    CBG        Mg%  ->Lantus         u  + Nov 70/30    _____        Units  total for Sun ++++++++++++++++++++++++++++++++++++++++++++++++++++++++++++++++++++++++++++++++++++++++++++++++  Piqray 150 mg - 1 tablet (day17) 8:30  AM   (03/10/2018)  -   FBG            Mg%   ->   Nov 70/30  1:30 PM    (03/10/2018)  -    CBG          Mg%   ->    Nov 70/30       6:00 PM   (03/10/2018)  -    CBG          Mg%   ->    Nov  70/30      10:00 PM     (03/10/2018)   -   CBG        Mg%  ->Lantus     u  +     Nov 70/30    _____        Units total for Mon

## 2018-03-10 ENCOUNTER — Other Ambulatory Visit: Payer: Self-pay | Admitting: Internal Medicine

## 2018-03-10 NOTE — Progress Notes (Addendum)
(02/08/2018) - FBG 357 mg% ->  Nov 70/30  15 u  6:30 pm (02/08/2018) - CBG 482 mg% ->  Nov 70/30 30 u 11:00 pm (02/08/2018) - CBG 486 mg% ->Lantus 20 u   ____65 u +++++++++++++++++++++++++++++++++++++++++++++++++++++++++++++++++++++++++ 8:30 am (02/09/2018) - FBG 331 mg% ->  Nov 70/30 45 u  6:30 pm (02/09/2018) - CBG 410 mg% ->  Nov 70/30  50 u 11:00pm (02/09/2018) -CBG426 mg% -> Lantus 20 u+ Nov 70/30  20 u  ____ 135 u  ++++++++++++++++++++++++++++++++++++++++++++++++++++++++++++++++++++++++++ 9:00 am (02/11/2018) - FBG 236mg % ->  Nov 70/30 20u 6:30 pm (02/11/2018) - CBG 329mg % ->  Nov 70/30  20u  11:00 pm (02/11/2018) - CBG428 mg% -> ->  Nov 70/30  60 u  ____100 u  ++++++++++++++++++++++++++++++++++++++++++++++++++++++++++++++++++++++++++ 9:30 am (02/12/2018) - FBG 332  mg% -> Lantus 20 u +    Nov 70/30 60 u  4:00 pm (02/12/2018) - CBG  354 mg% -> 7:00 pm (02/12/2018) - CBG 254 mg% ->  Nov 70/30  20 u  11:00 pm (02/12/2018) - CBG199mg %-> Lantus 60 u  ____ 160 u +++++++++++++++++++++++++++++++++++++++++++++++++++++++++++++++++++++++++++ 9:00 am (02/13/2018) - FBG  501mg % ->  Nov 70/30  60u 3:30pm (02/13/2018) -  10:00 pm(02/13/2018) - CBG  556 mg% -> Lantus 80u +  Nov 70/30   60u  ____200 u +++++++++++++++++++++++++++++++++++++++++++++++++++++++++++++++++++++++++++ 8:30 am (02/14/2018) - FBG 377 mg% ->  Nov 70/30  70u 12:00(02/14/2018)- CBG 451 mg%   Nov 70/30  80 u 6:00 pm (02/14/2018) - CBG 452 mg% ->  Nov 70/30  80 u 9:00 pm (02/14/2018) - CBG 428mg % -> Lantus 120 u ____350 u TOTAL for Friday  +++++++++++++++++++++++++++++++++++++++++++++++++++++++++++++++++++++++++++ 8:00 am (02/15/2018)  - FBG 288mg % ->  Nov 70/30  80 u 3:00 pm (02/15/2018) - CBG 436mg % ->  Nov 70/30  100u 7:00 pm (02/15/2018) - CBG 443 mg% ->  Nov 70/30  100 u  10:30 pm (02/15/2018) - CBG 401 mg% -> Lantus 120 u +  Nov 70/30  100 u   ____500 uTOTAL for Saturday +++++++++++++++++++++++++++++++++++++++++++++++++++++++++++++++++++++++++++ NO Piqray 8:00 am (02/16/2018) - FBG 232 mg% ->   Nov 70/30  80 u 3:00 pm (02/16/2018) - CBG 313 mg% ->   Nov 70/30  80 u       7:00 pm (02/16/2018) - CBG 184 mg% ->   Nov 70/30 20u                      10:00 pm (02/16/2018) - CBG 243 mg% -> No Lantus  Nov 70/30 40 u  ____ 220 u TOTAL for Sunday ++++++++++++++++++++++++++++++++++++++++++++++++++++++++++++++++++++++++++++ 10:00 am (02/17/2018) - FBG 72 mg% ->  No Insulin  12:00 pm (02/17/2018) - CBG  202 mg% ->  No Insulin   4:00 pm (02/17/2018) - CBG  299 mg% ->at office visit -     Nov 70/30  30 u At 5:30 pm 11:00 PM (02/17/2018) - CBG  221 mg%   Nov 70/30  15 u At 11:15 pm  ____ 45 units TOTAL for Mon  +++++++++++++++++++++++++++++++++++++++++++++++++++++++++++++++++++++++++++++ 8:30Am (02/18/2018) - FBG147 mg% ->  Nov 70/30 15 u  2:00 Pm (02/18/2018) - CBG 215mg % -> Nov 70/30 15 u 7:00 Pm (02/18/2018) - CBG 194 mg% -> Nov 70/30 30 u 10:00 Pm (02/18/2018) - CBG 145 mg% ->Lantus 20 u     ____ 80Units TOTAL for Tues +++++++++++++++++++++++++++++++++++++++++++++++++++++++++++++++++++++++++++++++ 8:00 am (02/19/2018) - FBG230Mg % ->  Nov 70/3035 u 2:00 Pm (02/19/2018) - CBG 98 Mg% ->  No Insulin 7:00 Pm (02/19/2018) -  CBG183Mg % ->  Nov 70/3020 u 11:00 Pm (02/19/2018) - CBG139Mg % ->Lantus 25 u   ____  80  Units TOTAL for Wed ++++++++++++++++++++++++++++++++++++++++++++++++++++++++++++++++++++++++++++++++ 8:00 AM(02/20/2018) - FBG122Mg % -> Nov 70/3020u 1:00PM(02/20/2018) - CBG 71Mg % -> No Insulin 6:00 PM (02/20/2018) - CBG103Mg % -> No Insulin 10:00 PM (02/20/2018) - CBG154Mg % ->Lantus 20 u   ____40Units totalfor Thurs ++++++++++++++++++++++++++++++++++++++++++++++++++++++++++++++++++++++++++++++++ Restart Piqray 150 mg - 1 tablet  8:00  AM (02/21/2018) - FBG  176Mg %  -> Nov 70/30  15u  2:30PM(02/21/2018) - CBG 149 Mg% ->     Episode of N/V 2 hr after took [place] &Metformin at 2:30 pm  6:30 PM (02/21/2018) - CBG  261Mg %  ->  Nov 70/30  15u 10:00 PM (02/21/2018) - CBG 236Mg % ->Lantus25 u   ____ 55Units totalfor  Fri +++++++++++++++++++++++++++++++++++++++++++++++++++++++++++++++++++++++++++++++  Piqray 150 mg 1 tablet (day 2)(Metformin on hold ->) 8:00  AM  (02/22/2018)  -  FBG  145Mg % ->  Nov 70/30  10u  2:30 PM(02/22/2018) - CBG 112 Mg% ->      6:30 PM (02/22/2018)  -  CBG 213 Mg% ->  Nov 70/30  10u 10:00 PM  (02/22/2018)  -  CBG 235Mg % ->Lantus 25u   ____  45Units total for Sat ++++++++++++++++++++++++++++++++++++++++++++++++++++++++++++++++++++++++++++++++  Piqray 150 mg - 1 tablet (day3) 8:00  AM  (02/23/2018)  -  FBG  171Mg % ->  Nov 70/30   10u  2:00 PM (02/23/2018) - CBG  207Mg % ->     Nov 70/30  10u  6:00 PM  (02/23/2018)  -  CBG  191Mg % ->   Nov 70/30   10u 10:00 PM  (02/23/2018)  -  CBG  245Mg % ->Lantus35 u  Nov 70/30  10u  _____  75Units total  forSun +++++++++++++++++++++++++++++++++++++++++++++++++++++++++++++++++++++++++++++++++  Piqray 150 mg - 1 tablet (day4) 8:00  AM  (02/24/2018)  -  FBG  148 Mg% ->       2:30 PM (02/24/2018)  -  CBG  274Mg % ->    Nov 70/30   30u  6:00 PM  (02/24/2018)  -  CBG  347Mg % ->   Nov 70/30  30u 10:00 PM  (02/24/2018)  -  CBG  419Mg % ->Lantus60u   Nov 70/30  20u  _____ 140Units total forMon +++++++++++++++++++++++++++++++++++++++++++++++++++++++++++++++++++++++++++++++++  Piqray 150 mg - 1 tablet (day5) 8:00  AM  (02/25/2018)  -  FBG 169 Mg% ->      Nov 70/30 30u  2:30 PM (02/25/2018)  -  CBG  176Mg % ->       Nov 70/30  30u  6:00 PM  (02/25/2018)  -  CBG  251 Mg% ->   Nov 70/30  30u 10:00  PM  (02/25/2018)   -  CBG  286Mg % -> Lantus 80u  Nov 70/30  30 u  _____ 200Units total  forTues +++++++++++++++++++++++++++++++++++++++++++++++++++++++++++++++++++++++++++++++++  Piqray 150 mg - 1 tablet (day6) 8:00  AM  (02/26/2018)  - FBG 93 Mg%  ->   Nov 70/30 25u 2:30 PM  (02/26/2018)  -  CBG  265 Mg% ->      Nov 70/30 40u 6:00 PM   (02/26/2018)  - CBG  313 Mg% ->    Nov 70/30  50u 10:00 PM   (02/26/2018)   -   CBG  389Mg % -> Lantus100 u Nov 70/30 40u _____ 255Units total forWed +++++++++++++++++++++++++++++++++++++++++++++++++++++++++++++++++++++++++++++++++  Piqray 150 mg - 1 tablet (day7)  8:00  AM  (02/27/2018)  -  FBG  293Mg %  ->     Nov 70/30  60  u 1:30 PM  (02/27/2018)  -   CBG   117Mg %  ->     Nov 70/30 40u 5:30  PM   (02/27/2018)  -  CBG    81Mg %  ->     10:00 PM   (02/27/2018)   -   CBG   92Mg %  -> Lantus 20 u   _____  120Units total  forThurs +++++++++++++++++++++++++++++++++++++++++++++++++++++++++++++++++++++++++++++++++  Piqray 150 mg - 1 tablet (day8) 8:00  AM  (02/28/2018)  -  FBG  121Mg %   ->    Nov 70/30 10u 1:30 PM  (02/28/2018)  -   CBG  237Mg %  ->     Nov 70/30 25u 5:30  PM   (02/28/2018)  -  CBG   269 Mg%   ->     Nov 70/30 35u 10:00 PM   (02/28/2018)   -   CBG  267Mg %   ->Lantus50u   Nov 70/30 15u   _____  135Units total forFri +++++++++++++++++++++++++++++++++++++++++++++++++++++++++++++++++++++++++++++++++  Piqray 150 mg - 1 tablet (day9) 8:30  AM  (03/01/2018)  -  FBG  307Mg %   ->   Nov 70/30 40u 1:30 PM  (03/01/2018)  -   CBG  157Mg %   ->   Nov 70/30 20u 5:30  PM   (03/01/2018)  -  CBG   139Mg %   ->   Nov 70/30 25u 10:00 PM   (03/01/2018)   -   CBG  193Mg %  ->Lantus70u  Nov 70/30 10u  _____  165Units total  forSat ++++++++++++++++++++++++++++++++++++++++++++++++++++++  Piqray 150 mg - 1 tablet (day10) 8:30  AM  (03/02/2018)  -   FBG  286Mg %   ->    Nov 70/30 35u 1:30 PM  (03/02/2018)  -   CBG  306 Mg%   ->    Nov 70/30 60u 5:30  PM   (03/02/2018)  -   CBG   359 Mg%   ->    Nov 70/30 60u 10:00 PM   (03/02/2018)   -   CBG   330Mg %  ->Lantus100u+ Nov 70/30 60u   _____  315Units total forSun +++++++++++++++++++++++++++++++++++++++++++++++++++++++++++++++++++++++++++++++++++++++++++++++  Piqray 150 mg - 1 tablet (day11) 8:30  AM  (03/03/2018)  -   FBG  121Mg %  ->     Nov 70/30 20u 1:30 PM  (03/03/2018)  -   CBG   112 Mg%   ->     Nov 70/30 20u 5:30 PM  (03/03/2018)  -   CBG   219Mg %   ->    Nov 70/30 35u 10:00 PM   (03/03/2018)   -   CBG   257Mg %  ->Lantus 120u + Nov 70/30 40u  _____  235Units total  forMon +++++++++++++++++++++++++++++++++++++++++++++++++++++++++++++++++++++++++++++++++++++++++++++++  Piqray 150 mg - 1 tablet (day12) 8:30  AM  (03/04/2018)  -   FBG  112Mg %  ->   Nov 70/30 20u 1:30 PM  (03/04/2018)  -   CBG    97 Mg%   ->     5:30 PM  (03/04/2018)  -   CBG   198Mg %   ->    Nov 70/30 10u 10:00 PM   (03/04/2018)   -   CBG   211Mg %  ->Lantus 110 u + Nov 70/30 10u  _____  150Units total forTues +++++++++++++++++++++++++++++++++++++++++++++++++++++++++++++++++++++++++++++++++++++++++++++++  Piqray 150 mg - 1 tablet (day13) 8:30  AM  (03/05/2018)  -   FBG   126  Mg%  ->    Nov 70/30  20 u 1:30  PM  (03/05/2018)  -   CBG    82 Mg%   ->     6:00 PM  (03/05/2018)  -   CBG   188 Mg%   ->     Nov 70/30  20 u 10:00 PM    (03/05/2018)   -   CBG   207 Mg%  ->Lantus 110 u + Nov 70/30  10 u  _____ 160 Units total  forWed +++++++++++++++++++++++++++++++++++++++++++++++++++++++++++++++++++++++++++++++++++++++++++++++  Piqray 150 mg - 1 tablet (day14) 8:30  AM   (03/06/2018)  -   FBG 132  Mg%  ->   Nov 70/30  15 u 1:30 PM  (03/06/2018)  -   CBG 229 Mg%   ->    Nov 70/30  20 u 6:00 PM  (03/06/2018)  -   CBG 187Mg %   ->      10:00 PM    (03/06/2018)   -   CBG 151 Mg%  ->Lantus 110u + Nov 70/30  25 u  _____ 170Units total forThurs +++++++++++++++++++++++++++++++++++++++++++++++++++++++++++++++++++++++++++++++++++++++++++++++  Piqray 150 mg - 1 tablet (day15) 8:30  AM   (03/07/2018)  -   FBG  235Mg %   ->    Nov 70/30  25u 1:30 PM  (03/07/2018)  -   CBG  202Mg %   ->    Nov 70/30  25u 6:00 PM  (03/07/2018)  -   CBG  190Mg %   ->    Nov 70/3025u 10:00 PM    (03/07/2018)   -   CBG  185Mg %  ->Lantus 110u  +   _____ 185Units total  forFri +++++++++++++++++++++++++++++++++++++++++++++++++++++++++++++++++++++++++++++++++++++++++++++++  Piqray 150 mg - 1 tablet (day16 8:30  AM   (03/08/2018)  -   FBG  120Mg %   ->    Nov 70/30  15u 1:30 PM  (03/08/2018)  -   CBG   105 Mg%   ->      6:00 PM  (03/08/2018)  -   CBG  103 Mg%   ->     10:00 PM    (03/08/2018)   -   CBG  188Mg %  ->Lantus 110u  +   _____  125Units total for Sat ++++++++++++++++++++++++++++++++++++++++++++++++++++++++++++++++++++++++++++++++++++++++++++++++  Piqray 150 mg - 1 tablet (day17) 8:30  AM   (03/09/2018)  -   FBG   140 Mg%   ->   Nov 70/30  15 u 1:30 PM  (03/09/2018)  -    CBG   114 Mg%   ->       6:00 PM  (03/09/2018)  -    CBG    219  Mg%   ->     10:00 PM    (03/09/2018)   -    CBG  255  Mg%  ->Lantus 120u  +   Nov 70/30 10 u  _____ 145  Units total for  Sun ++++++++++++++++++++++++++++++++++++++++++++++++++++++++++++++++++++++++++++++++++++++++++++++++  Piqray 150 mg - 1 tablet (day18) 8:30  AM   (03/10/2018)  -    FBG   224 Mg%   ->   Nov 70/30 25 u          1:30 PM    (03/10/2018)  -    CBG    87 Mg%   ->    Nov 70/30   6:00 PM   (03/10/2018)  -    CBG     79 Mg%   ->    Nov 70/30 10:00 PM     (03/10/2018)   -   CBG  197Mg %  ->Lantus120 u  +  Nov 70/30 20 u        _____ 165Units total for Mon ++++++++++++++++++++++++++++++++++++++++++++++++++++++++++++++++++++++++++++++++++++++++++++++++  Piqray 150 mg - 1 tablet (day19) 8:30  AM   (03/11/2018)  -    FBG   192   Mg%   ->    Nov 70/30  15 u          1:30 PM    (03/11/2018)  -     CBG    82  Mg%   ->     Nov 70/30   6:00 PM   (03/11/2018)  -     CBG  220Mg %   ->    Nov 70/3020 u 10:00 PM     (03/11/2018)   -    CBG  232Mg %  ->Lantus120 u  +     Nov 70/30 10 u         _____  165Units total forTues

## 2018-03-11 MED FILL — PROMETHAZINE 25 MG TABLET: 25 | 5 days supply | Qty: 30 | Fill #3

## 2018-03-11 MED FILL — CELECOXIB 200 MG CAP: 200 | 30 days supply | Qty: 60 | Fill #1

## 2018-03-11 MED FILL — LANTUS 100 UNITS/ML VIAL: 100 | 79 days supply | Qty: 120 | Fill #0

## 2018-03-12 ENCOUNTER — Other Ambulatory Visit: Payer: Self-pay | Admitting: Internal Medicine

## 2018-03-12 NOTE — Progress Notes (Signed)
(02/08/2018) - FBG 357 mg% ->  Nov 70/30  15 u  6:30 pm (02/08/2018) - CBG 482 mg% ->  Nov 70/30 30 u 11:00 pm (02/08/2018) - CBG 486 mg% ->Lantus 20 u   ____65 u +++++++++++++++++++++++++++++++++++++++++++++++++++++++++++++++++++++++++ 8:30 am (02/09/2018) - FBG 331 mg% ->  Nov 70/30 45 u  6:30 pm (02/09/2018) - CBG 410 mg% ->  Nov 70/30  50 u 11:00pm (02/09/2018) -CBG426 mg% -> Lantus 20 u+ Nov 70/30  20 u  ____ 135 u  ++++++++++++++++++++++++++++++++++++++++++++++++++++++++++++++++++++++++++ 9:00 am (02/11/2018) - FBG 236mg % ->  Nov 70/30 20u 6:30 pm (02/11/2018) - CBG 329mg % ->  Nov 70/30  20u  11:00 pm (02/11/2018) - CBG428 mg% -> ->  Nov 70/30  60 u  ____100 u  ++++++++++++++++++++++++++++++++++++++++++++++++++++++++++++++++++++++++++ 9:30 am (02/12/2018) - FBG 332  mg% -> Lantus 20 u +    Nov 70/30 60 u  4:00 pm (02/12/2018) - CBG  354 mg% -> 7:00 pm (02/12/2018) - CBG 254 mg% ->  Nov 70/30  20 u  11:00 pm (02/12/2018) - CBG199mg %-> Lantus 60 u  ____ 160 u +++++++++++++++++++++++++++++++++++++++++++++++++++++++++++++++++++++++++++ 9:00 am (02/13/2018) - FBG  501mg % ->  Nov 70/30  60u 3:30pm (02/13/2018) -  10:00 pm(02/13/2018) - CBG  556 mg% -> Lantus 80u +  Nov 70/30   60u  ____200 u +++++++++++++++++++++++++++++++++++++++++++++++++++++++++++++++++++++++++++ 8:30 am (02/14/2018) - FBG 377 mg% ->  Nov 70/30  70u 12:00(02/14/2018)- CBG 451 mg%   Nov 70/30  80 u 6:00 pm (02/14/2018) - CBG 452 mg% ->  Nov 70/30  80 u 9:00 pm (02/14/2018) - CBG 428mg % -> Lantus 120 u ____350 u TOTAL for Friday  +++++++++++++++++++++++++++++++++++++++++++++++++++++++++++++++++++++++++++ 8:00 am (02/15/2018)  - FBG 288mg % ->  Nov 70/30  80 u 3:00 pm (02/15/2018) - CBG 436mg % ->  Nov 70/30  100u 7:00 pm (02/15/2018) - CBG 443 mg% ->  Nov 70/30  100 u  10:30 pm (02/15/2018) - CBG 401 mg% -> Lantus 120 u +  Nov 70/30  100 u   ____500 uTOTAL for Saturday +++++++++++++++++++++++++++++++++++++++++++++++++++++++++++++++++++++++++++ NO Piqray 8:00 am (02/16/2018) - FBG 232 mg% ->   Nov 70/30  80 u 3:00 pm (02/16/2018) - CBG 313 mg% ->   Nov 70/30  80 u   7:00 pm (02/16/2018) - CBG 184 mg% ->   Nov 70/30 20u   10:00 pm (02/16/2018) - CBG 243 mg% -> No Lantus  Nov 70/30 40 u  ____ 220 u TOTAL for Sunday ++++++++++++++++++++++++++++++++++++++++++++++++++++++++++++++++++++++++++++ 10:00 am (02/17/2018) - FBG 72 mg% ->  No Insulin  12:00 pm (02/17/2018) - CBG  202 mg% ->  No Insulin   4:00 pm (02/17/2018) - CBG  299 mg% ->at office visit -     Nov 70/30  30 u At 5:30 pm 11:00 PM (02/17/2018) - CBG  221 mg%   Nov 70/30  15 u At 11:15 pm  ____ 45 units TOTAL for Mon  +++++++++++++++++++++++++++++++++++++++++++++++++++++++++++++++++++++++++++++ 8:30Am (02/18/2018) - FBG147 mg% ->  Nov 70/30 15 u  2:00 Pm (02/18/2018) - CBG 215mg % -> Nov 70/30 15 u 7:00 Pm (02/18/2018) - CBG 194 mg% -> Nov 70/30 30 u 10:00 Pm (02/18/2018) - CBG 145 mg% ->Lantus 20 u     ____ 80Units TOTAL for Tues +++++++++++++++++++++++++++++++++++++++++++++++++++++++++++++++++++++++++++++++ 8:00 am (02/19/2018) - FBG230Mg % ->  Nov 70/3035 u 2:00 Pm (02/19/2018) - CBG 98 Mg% ->  No Insulin 7:00 Pm (02/19/2018) - CBG183Mg % ->  Nov 70/3020 u 11:00 Pm (02/19/2018) - CBG139Mg % ->Lantus 25 u   ____  80  Units TOTAL for  Wed ++++++++++++++++++++++++++++++++++++++++++++++++++++++++++++++++++++++++++++++++ 8:00 AM(02/20/2018) - FBG122Mg % -> Nov 70/3020u 1:00PM(02/20/2018) - CBG 71Mg % -> No Insulin 6:00 PM (02/20/2018) - CBG103Mg % -> No Insulin 10:00 PM (02/20/2018) - CBG154Mg % ->Lantus 20 u   ____40Units totalfor Thurs ++++++++++++++++++++++++++++++++++++++++++++++++++++++++++++++++++++++++++++++++ Restart Piqray 150 mg - 1 tablet  8:00  AM (02/21/2018) - FBG  176Mg %  -> Nov 70/30  15u  2:30PM(02/21/2018) - CBG 149 Mg% ->     Episode of N/V 2 hr after took [place] &Metformin at 2:30 pm  6:30 PM (02/21/2018) - CBG  261Mg %  ->  Nov 70/30  15u 10:00 PM (02/21/2018) - CBG 236Mg % ->Lantus25 u   ____ 55Units totalfor  Fri +++++++++++++++++++++++++++++++++++++++++++++++++++++++++++++++++++++++++++++++  Piqray 150 mg 1 tablet (day 2)(Metformin on hold ->) 8:00  AM  (02/22/2018)  -  FBG  145Mg % ->  Nov 70/30  10u  2:30 PM(02/22/2018) - CBG 112 Mg% ->      6:30 PM (02/22/2018)  -  CBG 213 Mg% ->  Nov 70/30  10u 10:00 PM  (02/22/2018)  -  CBG 235Mg % ->Lantus 25u   ____  45Units total for Sat ++++++++++++++++++++++++++++++++++++++++++++++++++++++++++++++++++++++++++++++++  Piqray 150 mg - 1 tablet (day3) 8:00  AM  (02/23/2018)  -  FBG  171Mg % ->  Nov 70/30   10u  2:00 PM (02/23/2018) - CBG  207Mg % ->     Nov 70/30  10u  6:00 PM  (02/23/2018)  -  CBG  191Mg % ->   Nov 70/30   10u 10:00 PM  (02/23/2018)  -  CBG  245Mg % ->Lantus35 u  Nov 70/30  10u  _____  75Units total  forSun +++++++++++++++++++++++++++++++++++++++++++++++++++++++++++++++++++++++++++++++++  Piqray 150 mg - 1 tablet (day4) 8:00  AM  (02/24/2018)  -  FBG  148 Mg% ->       2:30 PM (02/24/2018)  -  CBG  274Mg % ->    Nov 70/30   30u  6:00 PM  (02/24/2018)  -  CBG  347Mg % ->   Nov 70/30  30u 10:00 PM  (02/24/2018)  -  CBG  419Mg % ->Lantus60u   Nov 70/30  20u  _____ 140Units total forMon +++++++++++++++++++++++++++++++++++++++++++++++++++++++++++++++++++++++++++++++++  Piqray 150 mg - 1 tablet (day5) 8:00  AM  (02/25/2018)  -  FBG 169 Mg% ->      Nov 70/30 30u  2:30 PM (02/25/2018)  -  CBG  176Mg % ->       Nov 70/30  30u  6:00 PM  (02/25/2018)  -  CBG  251 Mg% ->   Nov 70/30  30u 10:00  PM  (02/25/2018)   -  CBG  286Mg % -> Lantus 80u  Nov 70/30  30 u  _____ 200Units total  forTues +++++++++++++++++++++++++++++++++++++++++++++++++++++++++++++++++++++++++++++++++  Piqray 150 mg - 1 tablet (day6) 8:00  AM  (02/26/2018)  - FBG 93 Mg%  ->   Nov 70/30 25u 2:30 PM  (02/26/2018)  -  CBG  265 Mg% ->      Nov 70/30 40u 6:00 PM   (02/26/2018)  - CBG  313 Mg% ->    Nov 70/30  50u 10:00 PM   (02/26/2018)   -   CBG  389Mg % -> Lantus100 u Nov 70/30 40u _____ 255Units total forWed +++++++++++++++++++++++++++++++++++++++++++++++++++++++++++++++++++++++++++++++++  Piqray 150 mg - 1 tablet (day7)  8:00  AM  (02/27/2018)  -  FBG  293Mg %  ->     Nov 70/30  60 u 1:30 PM  (02/27/2018)  -   CBG   117Mg %  ->     Nov 70/30 40u 5:30  PM   (02/27/2018)  -  CBG    81Mg %  ->     10:00 PM   (02/27/2018)   -   CBG   92Mg %  -> Lantus 20 u   _____  120Units total  forThurs +++++++++++++++++++++++++++++++++++++++++++++++++++++++++++++++++++++++++++++++++  Piqray 150 mg - 1 tablet (day8) 8:00  AM  (02/28/2018)  -  FBG  121Mg %   ->    Nov 70/30 10u 1:30 PM  (02/28/2018)  -   CBG  237Mg %  ->     Nov 70/30 25u 5:30  PM   (02/28/2018)  -  CBG   269 Mg%   ->     Nov 70/30 35u 10:00 PM   (02/28/2018)   -   CBG  267Mg %   ->Lantus50u   Nov 70/30 15u   _____  135Units total forFri +++++++++++++++++++++++++++++++++++++++++++++++++++++++++++++++++++++++++++++++++  Piqray 150 mg - 1 tablet (day9) 8:30  AM  (03/01/2018)  -  FBG  307Mg %   ->   Nov 70/30 40u 1:30 PM  (03/01/2018)  -   CBG  157Mg %   ->   Nov 70/30 20u 5:30  PM   (03/01/2018)  -  CBG   139Mg %   ->   Nov 70/30 25u 10:00 PM   (03/01/2018)   -   CBG  193Mg %  ->Lantus70u  Nov 70/30 10u  _____  165Units total  forSat ++++++++++++++++++++++++++++++++++++++++++++++++++++++  Piqray 150 mg - 1 tablet (day10) 8:30  AM  (03/02/2018)  -   FBG  286Mg %   ->    Nov 70/30 35u 1:30 PM  (03/02/2018)  -   CBG  306 Mg%   ->    Nov 70/30 60u 5:30  PM   (03/02/2018)  -   CBG   359 Mg%   ->    Nov 70/30 60u 10:00 PM   (03/02/2018)   -   CBG   330Mg %  ->Lantus100u+ Nov 70/30 60u   _____  315Units total forSun +++++++++++++++++++++++++++++++++++++++++++++++++++++++++++++++++++++++++++++++++++++++++++++++  Piqray 150 mg - 1 tablet (day11) 8:30  AM  (03/03/2018)  -   FBG  121Mg %  ->     Nov 70/30 20u 1:30 PM  (03/03/2018)  -   CBG   112 Mg%   ->     Nov 70/30 20u 5:30 PM  (03/03/2018)  -   CBG   219Mg %   ->    Nov 70/30 35u 10:00 PM   (03/03/2018)   -   CBG   257Mg %  ->Lantus 120u + Nov 70/30 40u  _____  235Units total  forMon +++++++++++++++++++++++++++++++++++++++++++++++++++++++++++++++++++++++++++++++++++++++++++++++  Piqray 150 mg - 1 tablet (day12) 8:30  AM  (03/04/2018)  -   FBG  112Mg %  ->   Nov 70/30 20u 1:30 PM  (03/04/2018)  -   CBG    97 Mg%   ->     5:30 PM  (03/04/2018)  -   CBG   198Mg %   ->    Nov 70/30 10u 10:00 PM   (03/04/2018)   -   CBG   211Mg %  ->Lantus 110 u + Nov 70/30 10u  _____  150Units total forTues +++++++++++++++++++++++++++++++++++++++++++++++++++++++++++++++++++++++++++++++++++++++++++++++  Piqray 150 mg - 1 tablet (day13) 8:30  AM  (03/05/2018)  -   FBG   126  Mg%  ->    Nov 70/30  20 u 1:30 PM  (03/05/2018)  -   CBG    82 Mg%   ->     6:00 PM  (03/05/2018)  -  CBG   188 Mg%   ->     Nov 70/30  20 u 10:00 PM    (03/05/2018)   -   CBG   207 Mg%  ->Lantus 110 u + Nov 70/30  10 u  _____ 160 Units total  forWed +++++++++++++++++++++++++++++++++++++++++++++++++++++++++++++++++++++++++++++++++++++++++++++++  Piqray 150 mg - 1 tablet (day14) 8:30  AM   (03/06/2018)  -   FBG 132  Mg%  ->   Nov 70/30  15 u 1:30 PM  (03/06/2018)  -   CBG 229 Mg%   ->    Nov 70/30  20 u 6:00 PM  (03/06/2018)  -   CBG 187Mg %   ->      10:00 PM    (03/06/2018)   -   CBG 151 Mg%  ->Lantus 110u + Nov 70/30  25 u  _____ 170Units total forThurs +++++++++++++++++++++++++++++++++++++++++++++++++++++++++++++++++++++++++++++++++++++++++++++++  Piqray 150 mg - 1 tablet (day15) 8:30  AM   (03/07/2018)  -   FBG  235Mg %   ->    Nov 70/30  25u 1:30 PM  (03/07/2018)  -   CBG  202Mg %   ->    Nov 70/30  25u 6:00 PM  (03/07/2018)  -   CBG  190Mg %   ->    Nov 70/3025u 10:00 PM    (03/07/2018)   -   CBG  185Mg %  ->Lantus 110u  +   _____ 185Units total  forFri +++++++++++++++++++++++++++++++++++++++++++++++++++++++++++++++++++++++++++++++++++++++++++++++  Piqray 150 mg - 1 tablet (day16 8:30  AM   (03/08/2018)  -   FBG  120Mg %   ->    Nov 70/30  15u 1:30 PM  (03/08/2018)  -   CBG   105 Mg%   ->      6:00 PM  (03/08/2018)  -   CBG  103Mg %   ->     10:00 PM    (03/08/2018)   -   CBG  188Mg %  ->Lantus 110u  +   _____  125Units total for Sat ++++++++++++++++++++++++++++++++++++++++++++++++++++++++++++++++++++++++++++++++++++++++++++++++  Piqray 150 mg - 1 tablet (day17) 8:30  AM   (03/09/2018)  -   FBG  140 Mg%   ->   Nov 70/30  15 u 1:30 PM  (03/09/2018)  -    CBG   114 Mg%   ->       6:00 PM  (03/09/2018)  -    CBG    219  Mg%   ->     10:00 PM    (03/09/2018)   -    CBG  255  Mg%  ->Lantus 120u  +   Nov 70/30 10 u  _____ 145  Units total for  Sun ++++++++++++++++++++++++++++++++++++++++++++++++++++++++++++++++++++++++++++++++++++++++++++++++  Piqray 150 mg - 1 tablet (day18) 8:30  AM   (03/10/2018)  -    FBG  224Mg %   ->   Nov 70/30 25 u 1:30 PM  (03/10/2018)  -    CBG    87 Mg%   ->    Nov 70/30   6:00 PM   (03/10/2018)  -   CBG     79 Mg%   ->    Nov 70/30 10:00 PM    (03/10/2018)   -   CBG  197Mg %  ->Lantus120 u  + Nov 70/30 20 u          _____ 165Units total for Mon ++++++++++++++++++++++++++++++++++++++++++++++++++++++++++++++++++++++++++++++++++++++++++++++++  Piqray 150 mg - 1 tablet (day19) 8:30  AM   (03/11/2018)  -  FBG  192  Mg%   ->    Nov 70/30  15 u 1:30 PM  (03/11/2018)  -     CBG    82  Mg%   ->     Nov 70/30   6:00 PM   (03/11/2018)  -    CBG  220Mg %   ->    Nov 70/3020 u 10:00 PM    (03/11/2018)   -    CBG  232Mg %  ->Lantus120 u  +    Nov 70/30 10 u         _____  165Units total for Tues ++++++++++++++++++++++++++++++++++++++++++++++++++++++++++++++++++++++++++++++++++++++++++++++++  Piqray 150 mg - 1 tablet (day20) 8:30  AM   (03/12/2018)  -    FBG   231     Mg%   ->       Nov 70/30   20 u  1:30 PM  (03/12/2018)  -     CBG  184   Mg%    ->       Nov 70/30  25 u 6:00 PM   (03/12/2018)  -    CBG   88  Mg%    ->      Nov 70/30  10:00 PM    (03/12/2018)   -    CBG  133 Mg%    ->Lantus  110  u +      Nov 70/30          _____ 155  Units total for  Wed

## 2018-03-13 ENCOUNTER — Other Ambulatory Visit: Payer: Self-pay | Admitting: Internal Medicine

## 2018-03-13 MED ORDER — FUROSEMIDE 40 MG PO TABS: 40.0000 mg | ORAL_TABLET | Freq: Every day | ORAL | 11 refills | Status: DC

## 2018-03-13 MED FILL — PIQRAY 300MG DAILY DOSE 2X1: 2 X 150 | 28 days supply | Qty: 56 | Fill #1

## 2018-03-13 NOTE — Progress Notes (Signed)
(02/08/2018) - FBG 357 mg% ->  Nov 70/30  15 u  6:30 pm (02/08/2018) - CBG 482 mg% ->  Nov 70/30 30 u 11:00 pm (02/08/2018) - CBG 486 mg% ->Lantus 20 u   ____65 u +++++++++++++++++++++++++++++++++++++++++++++++++++++++++++++++++++++++++ 8:30 am (02/09/2018) - FBG 331 mg% ->  Nov 70/30 45 u  6:30 pm (02/09/2018) - CBG 410 mg% ->  Nov 70/30  50 u 11:00pm (02/09/2018) -CBG426 mg% -> Lantus 20 u+ Nov 70/30  20 u  ____ 135 u  ++++++++++++++++++++++++++++++++++++++++++++++++++++++++++++++++++++++++++ 9:00 am (02/11/2018) - FBG 236mg % ->  Nov 70/30 20u 6:30 pm (02/11/2018) - CBG 329mg % ->  Nov 70/30  20u  11:00 pm (02/11/2018) - CBG428 mg% -> ->  Nov 70/30  60 u  ____100 u  ++++++++++++++++++++++++++++++++++++++++++++++++++++++++++++++++++++++++++ 9:30 am (02/12/2018) - FBG 332  mg% -> Lantus 20 u +    Nov 70/30 60 u  4:00 pm (02/12/2018) - CBG  354 mg% -> 7:00 pm (02/12/2018) - CBG 254 mg% ->  Nov 70/30  20 u  11:00 pm (02/12/2018) - CBG199mg %-> Lantus 60 u  ____ 160 u +++++++++++++++++++++++++++++++++++++++++++++++++++++++++++++++++++++++++++ 9:00 am (02/13/2018) - FBG  501mg % ->   Nov 70/30  60u 3:30pm (02/13/2018) -  10:00 pm(02/13/2018) - CBG  556 mg% -> Lantus 80u +  Nov 70/30   60u  ____200 u +++++++++++++++++++++++++++++++++++++++++++++++++++++++++++++++++++++++++++ 8:30 am (02/14/2018) - FBG 377 mg% ->   Nov 70/30  70u 12:00(02/14/2018)- CBG 451 mg%   Nov 70/30  80 u 6:00 pm (02/14/2018) - CBG 452 mg% ->   Nov 70/30  80 u 9:00 pm (02/14/2018) - CBG 428mg % -> Lantus 120 u ____350 u TOTAL for Friday  +++++++++++++++++++++++++++++++++++++++++++++++++++++++++++++++++++++++++++ 8:00 am (02/15/2018)  - FBG 288mg % ->    Nov 70/30  80 u 3:00 pm (02/15/2018) - CBG 436mg % ->  Nov 70/30  100u 7:00 pm (02/15/2018) - CBG 443 mg% ->  Nov 70/30  100 u  10:30 pm (02/15/2018) - CBG 401 mg% -> Lantus 120 u +  Nov 70/30  100 u   ____500 uTOTAL for Saturday +++++++++++++++++++++++++++++++++++++++++++++++++++++++++++++++++++++++++++ NO Piqray 8:00 am (02/16/2018) - FBG 232 mg% ->   Nov 70/30  80 u 3:00 pm (02/16/2018) - CBG 313 mg% ->   Nov 70/30  80 u   7:00 pm (02/16/2018) - CBG 184 mg% ->   Nov 70/30 20u   10:00 pm (02/16/2018) - CBG 243 mg% -> No Lantus       Nov 70/30 40 u  ____ 220 u TOTAL for Sunday ++++++++++++++++++++++++++++++++++++++++++++++++++++++++++++++++++++++++++++ 10:00 am (02/17/2018) - FBG 72 mg% ->  No Insulin  12:00 pm (02/17/2018) - CBG  202 mg% ->   No Insulin   4:00 pm (02/17/2018) - CBG  299 mg% ->at office visit  -       Nov 70/30  30 u At 5:30 pm 11:00 PM (02/17/2018) - CBG  221 mg%       Nov 70/30  15 u At 11:15 pm  ____ 45 units TOTAL for Mon  +++++++++++++++++++++++++++++++++++++++++++++++++++++++++++++++++++++++++++++ 8:30Am (02/18/2018) - FBG147 mg% ->  Nov 70/30 15 u  2:00 Pm (02/18/2018) - CBG 215mg % -> Nov 70/30 15 u 7:00 Pm (02/18/2018) - CBG 194 mg% -> Nov 70/30 30 u 10:00 Pm (02/18/2018) - CBG 145 mg% ->Lantus 20 u     ____ 80Units TOTAL for Tues +++++++++++++++++++++++++++++++++++++++++++++++++++++++++++++++++++++++++++++++ 8:00 am (02/19/2018) - FBG230Mg % ->  Nov 70/3035 u 2:00 Pm (02/19/2018) - CBG 98 Mg% ->  No Insulin 7:00 Pm (02/19/2018) - CBG183Mg % ->  Nov 70/3020  u 11:00 Pm (02/19/2018) - CBG139Mg % ->Lantus 25 u   ____   80 Units TOTAL for Wed ++++++++++++++++++++++++++++++++++++++++++++++++++++++++++++++++++++++++++++++++ 8:00 AM(02/20/2018) - FBG122Mg % -> Nov 70/3020u 1:00PM(02/20/2018) - CBG 71Mg % -> No Insulin 6:00 PM (02/20/2018) - CBG103Mg % -> No Insulin 10:00 PM (02/20/2018) - CBG154Mg % ->Lantus 20 u   ____40Units totalfor Thurs ++++++++++++++++++++++++++++++++++++++++++++++++++++++++++++++++++++++++++++++++ Restart Piqray 150 mg - 1 tablet  8:00  AM (02/21/2018) - FBG  176Mg %  -> Nov 70/30  15u  2:30PM(02/21/2018) - CBG 149 Mg% ->     Episode of N/V 2 hr after took [place] &Metformin at 2:30 pm  6:30 PM (02/21/2018) - CBG  261Mg %  ->  Nov 70/30  15u 10:00 PM (02/21/2018) - CBG 236Mg % ->Lantus25 u   ____ 55Units totalfor  Fri +++++++++++++++++++++++++++++++++++++++++++++++++++++++++++++++++++++++++++++++  Piqray 150 mg 1 tablet (day 2)(Metformin on hold ->) 8:00  AM  (02/22/2018)  -  FBG  145Mg % ->  Nov 70/30  10u  2:30 PM(02/22/2018) - CBG 112 Mg% ->      6:30 PM (02/22/2018)  -  CBG 213 Mg% ->  Nov 70/30  10u 10:00 PM  (02/22/2018)  -  CBG 235Mg % ->Lantus 25u   ____  45Units total for Sat ++++++++++++++++++++++++++++++++++++++++++++++++++++++++++++++++++++++++++++++++  Piqray 150 mg - 1 tablet (day3) 8:00  AM  (02/23/2018)  -  FBG  171Mg % ->  Nov 70/30   10u  2:00 PM (02/23/2018) - CBG  207Mg % ->     Nov 70/30  10u  6:00 PM  (02/23/2018)  -  CBG  191Mg % ->   Nov 70/30   10u 10:00 PM  (02/23/2018)  -  CBG  245Mg % ->Lantus35 u  Nov 70/30  10u  _____  75Units total  forSun +++++++++++++++++++++++++++++++++++++++++++++++++++++++++++++++++++++++++++++++++  Piqray 150 mg - 1 tablet (day4) 8:00  AM  (02/24/2018)  -  FBG  148 Mg% ->       2:30 PM (02/24/2018)  -  CBG  274Mg % ->    Nov 70/30   30u  6:00 PM  (02/24/2018)  -  CBG  347Mg % ->   Nov 70/30  30u 10:00 PM  (02/24/2018)  -  CBG  419Mg % ->Lantus60u   Nov 70/30  20u  _____ 140Units total forMon +++++++++++++++++++++++++++++++++++++++++++++++++++++++++++++++++++++++++++++++++  Piqray 150 mg - 1 tablet (day5) 8:00  AM  (02/25/2018)  -  FBG 169 Mg% ->      Nov 70/30 30u  2:30 PM (02/25/2018)  -  CBG  176Mg % ->       Nov 70/30  30u  6:00 PM  (02/25/2018)  -  CBG  251 Mg% ->   Nov 70/30  30u 10:00  PM  (02/25/2018)   -  CBG  286Mg % -> Lantus 80u  Nov 70/30  30 u  _____ 200Units total  forTues +++++++++++++++++++++++++++++++++++++++++++++++++++++++++++++++++++++++++++++++++  Piqray 150 mg - 1 tablet (day6) 8:00  AM  (02/26/2018)  - FBG 93 Mg%  ->   Nov 70/30 25u 2:30 PM  (02/26/2018)  -  CBG  265 Mg% ->      Nov 70/30 40u 6:00 PM   (02/26/2018)  - CBG  313 Mg% ->    Nov 70/30  50u 10:00 PM   (02/26/2018)   -   CBG  389Mg % -> Lantus100 u Nov 70/30 40u _____ 255Units total forWed +++++++++++++++++++++++++++++++++++++++++++++++++++++++++++++++++++++++++++++++++  Piqray 150 mg - 1 tablet (day7)  8:00  AM  (02/27/2018)  -  FBG  293Mg %  ->     Nov 70/30  60 u 1:30 PM  (02/27/2018)  -  CBG   117Mg %  ->     Nov 70/30 40u 5:30  PM   (02/27/2018)  -  CBG    81Mg %  ->     10:00 PM   (02/27/2018)   -   CBG   92Mg %  -> Lantus 20 u   _____  120Units total  forThurs +++++++++++++++++++++++++++++++++++++++++++++++++++++++++++++++++++++++++++++++++  Piqray 150 mg - 1 tablet (day8) 8:00  AM  (02/28/2018)  -  FBG  121Mg %   ->    Nov 70/30 10u 1:30 PM  (02/28/2018)  -   CBG  237Mg %  ->     Nov 70/30 25u 5:30  PM   (02/28/2018)  -  CBG   269 Mg%   ->     Nov 70/30 35u 10:00 PM   (02/28/2018)   -   CBG  267Mg %   ->Lantus50u   Nov 70/30 15u   _____  135Units total forFri +++++++++++++++++++++++++++++++++++++++++++++++++++++++++++++++++++++++++++++++++  Piqray 150 mg - 1 tablet (day9) 8:30  AM  (03/01/2018)  -  FBG  307Mg %   ->   Nov 70/30 40u 1:30 PM  (03/01/2018)  -   CBG  157Mg %   ->   Nov 70/30 20u 5:30  PM   (03/01/2018)  -  CBG   139Mg %   ->   Nov 70/30 25u 10:00 PM   (03/01/2018)   -   CBG  193Mg %  ->Lantus70u  Nov 70/30 10u  _____  165Units total  forSat ++++++++++++++++++++++++++++++++++++++++++++++++++++++  Piqray 150 mg - 1 tablet (day10) 8:30  AM  (03/02/2018)  -   FBG  286Mg %   ->    Nov 70/30 35u 1:30 PM  (03/02/2018)  -   CBG  306 Mg%   ->    Nov 70/30 60u 5:30  PM   (03/02/2018)  -   CBG   359 Mg%   ->    Nov 70/30 60u 10:00 PM   (03/02/2018)   -   CBG   330Mg %  ->Lantus100u+ Nov 70/30 60u   _____  315Units total forSun +++++++++++++++++++++++++++++++++++++++++++++++++++++++++++++++++++++++++++++++++++++++++++++++  Piqray 150 mg - 1 tablet (day11) 8:30  AM  (03/03/2018)  -   FBG  121Mg %  ->     Nov 70/30 20u 1:30 PM  (03/03/2018)  -   CBG   112 Mg%   ->     Nov 70/30 20u 5:30 PM  (03/03/2018)  -   CBG   219Mg %   ->    Nov 70/30 35u 10:00 PM   (03/03/2018)   -   CBG   257Mg %  ->Lantus 120u + Nov 70/30 40u  _____  235Units total  forMon +++++++++++++++++++++++++++++++++++++++++++++++++++++++++++++++++++++++++++++++++++++++++++++++  Piqray 150 mg - 1 tablet (day12) 8:30  AM  (03/04/2018)  -   FBG  112Mg %  ->   Nov 70/30 20u 1:30 PM  (03/04/2018)  -   CBG    97 Mg%   ->     5:30 PM  (03/04/2018)  -   CBG   198Mg %   ->    Nov 70/30 10u 10:00 PM   (03/04/2018)   -   CBG   211Mg %  ->Lantus 110 u + Nov 70/30 10u  _____  150Units total forTues +++++++++++++++++++++++++++++++++++++++++++++++++++++++++++++++++++++++++++++++++++++++++++++++  Piqray 150 mg - 1 tablet (day13) 8:30  AM  (03/05/2018)  -   FBG   126  Mg%  ->    Nov 70/30  20 u 1:30 PM  (03/05/2018)  -   CBG  82 Mg%   ->     6:00 PM  (03/05/2018)  -   CBG   188 Mg%   ->     Nov 70/30  20 u 10:00 PM    (03/05/2018)   -   CBG   207 Mg%  ->Lantus 110 u + Nov 70/30  10 u  _____ 160 Units total  forWed +++++++++++++++++++++++++++++++++++++++++++++++++++++++++++++++++++++++++++++++++++++++++++++++  Piqray 150 mg - 1 tablet (day14) 8:30  AM   (03/06/2018)  -   FBG 132  Mg%  ->   Nov 70/30  15 u 1:30 PM  (03/06/2018)  -   CBG 229 Mg%   ->    Nov 70/30  20 u 6:00 PM  (03/06/2018)  -   CBG 187Mg %   ->      10:00 PM    (03/06/2018)   -   CBG 151 Mg%  ->Lantus 110u + Nov 70/30  25 u  _____ 170Units total forThurs +++++++++++++++++++++++++++++++++++++++++++++++++++++++++++++++++++++++++++++++++++++++++++++++  Piqray 150 mg - 1 tablet (day15) 8:30  AM   (03/07/2018)  -   FBG  235Mg %   ->    Nov 70/30  25u 1:30 PM  (03/07/2018)  -   CBG  202Mg %   ->    Nov 70/30  25u 6:00 PM  (03/07/2018)  -   CBG  190Mg %   ->    Nov 70/3025u 10:00 PM    (03/07/2018)   -   CBG  185Mg %  ->Lantus 110u  +   _____ 185Units total  forFri +++++++++++++++++++++++++++++++++++++++++++++++++++++++++++++++++++++++++++++++++++++++++++++++  Piqray 150 mg - 1 tablet (day16 8:30  AM   (03/08/2018)  -   FBG  120Mg %   ->    Nov 70/30  15u 1:30 PM  (03/08/2018)  -   CBG   105 Mg%   ->      6:00 PM  (03/08/2018)  -   CBG  103Mg %   ->     10:00 PM    (03/08/2018)   -   CBG  188Mg %  ->Lantus 110u  +   _____  125Units total for Sat ++++++++++++++++++++++++++++++++++++++++++++++++++++++++++++++++++++++++++++++++++++++++++++++++  Piqray 150 mg - 1 tablet (day17) 8:30  AM   (03/09/2018)  -   FBG  140 Mg%   ->   Nov 70/30  15 u 1:30 PM  (03/09/2018)  -    CBG   114 Mg%   ->      6:00 PM  (03/09/2018)  -    CBG  219 Mg%   ->     10:00 PM    (03/09/2018)   -    CBG  255 Mg%  ->Lantus120u  +   Nov 70/30 10 u  _____ 145Units total for  Sun ++++++++++++++++++++++++++++++++++++++++++++++++++++++++++++++++++++++++++++++++++++++++++++++++  Piqray 150 mg - 1 tablet (day18) 8:30  AM   (03/10/2018)  -   FBG  224Mg %   ->     Nov 70/30 25 u 1:30 PM  (03/10/2018)  -    CBG   87 Mg%   ->         6:00 PM   (03/10/2018)  -   CBG  79 Mg%   ->       10:00 PM    (03/10/2018)   -   CBG  197Mg %  ->Lantus120u  +   Nov 70/30 20 u  _____ 165Units total for Mon ++++++++++++++++++++++++++++++++++++++++++++++++++++++++++++++++++++++++++++++++++++++++++++++++  Piqray 150 mg - 1 tablet (day19) 8:30  AM   (03/11/2018)   -  FBG   192 Mg%   ->       Nov 70/30  15 u 1:30 PM  (03/11/2018)  -    CBG   82 Mg%   ->      0   6:00 PM   (03/11/2018)  -   CBG  220Mg %   ->     Nov 70/3020 u 10:00 PM    (03/11/2018)   -   CBG  232Mg %  ->Lantus120u  +  Nov 70/30 10 u   _____ 165Units total  for Tues ++++++++++++++++++++++++++++++++++++++++++++++++++++++++++++++++++++++++++++++++++++++++++++++++  Piqray 150 mg - 1 tablet (day20) 8:30  AM   (03/12/2018)   -    FBG   231    Mg%   ->       Nov 70/30    20 u  1:30 PM  (03/12/2018)  -    CBG  184  Mg%    ->      Nov 70/30  25 u 6:00 PM   (03/12/2018)  -   CBG   88  Mg%    ->     0  10:00 PM    (03/12/2018)   -   CBG  133 Mg%    ->Lantus  110  u +        _____ 155  Units total for   Wed ++++++++++++++++++++++++++++++++++++++++++++++++++++++++++++++++++++++++++++++++++++++++++++++++  Piqray 150 mg - 1 tablet (day21) 8:30  AM   (03/13/2018)   -       FBG     137    Mg%   ->       Nov 70/30  10 u   1:30 PM  (03/13/2018)  -      CBG      89  Mg%    ->         6:00 PM   (03/13/2018)  -     CBG    144 Mg%    ->        10:00 PM    (03/13/2018)   -     CBG   123    Mg%    -> Lantus     u    +         _____  Units total for   Thurs ++++++++++++++++++++++++++++++++++++++++++++++++++++++++++++++++++++++++++++++++++++++++++++++++  Piqray 150 mg - 1 tablet (day22) 8:30  AM   (03/14/2018)   -       FBG               Mg%    ->       Nov 70/30     1:30 PM  (03/14/2018)  -      CBG               Mg%    ->      Nov 70/30   6:00 PM   (03/14/2018)  -     CBG            Mg%    ->      Nov 70/30  10:00 PM    (03/14/2018)   -     CBG              Mg%    -> Lantus     u    +     Nov 70/30    _____  Units total for  Fri ++++++++++++++++++++++++++++++++++++++++++++++++++++++++++++++++++++++++++++++++++++++++++++++++

## 2018-03-14 ENCOUNTER — Other Ambulatory Visit: Payer: Self-pay | Admitting: Internal Medicine

## 2018-03-14 NOTE — Progress Notes (Unsigned)
(02/08/2018) - FBG 357 mg% ->  Nov 70/30  15 u  6:30 pm (02/08/2018) - CBG 482 mg% ->  Nov 70/30 30 u 11:00 pm (02/08/2018) - CBG 486 mg% ->Lantus 20 u   ____65 u +++++++++++++++++++++++++++++++++++++++++++++++++++++++++++++++++++++++++ 8:30 am (02/09/2018) - FBG 331 mg% ->  Nov 70/30 45 u  6:30 pm (02/09/2018) - CBG 410 mg% ->  Nov 70/30  50 u 11:00pm (02/09/2018) -CBG426 mg% -> Lantus 20 u+ Nov 70/30  20 u  ____ 135 u  ++++++++++++++++++++++++++++++++++++++++++++++++++++++++++++++++++++++++++ 9:00 am (02/11/2018) - FBG 236mg % ->  Nov 70/30 20u 6:30 pm (02/11/2018) - CBG 329mg % ->  Nov 70/30  20u  11:00 pm (02/11/2018) - CBG428 mg% -> ->  Nov 70/30  60 u  ____100 u  ++++++++++++++++++++++++++++++++++++++++++++++++++++++++++++++++++++++++++ 9:30 am (02/12/2018) - FBG 332  mg% -> Lantus 20 u +    Nov 70/30 60 u  4:00 pm (02/12/2018) - CBG  354 mg% -> 7:00 pm (02/12/2018) - CBG 254 mg% ->  Nov 70/30  20 u  11:00 pm (02/12/2018) - CBG199mg %-> Lantus 60 u  ____ 160 u +++++++++++++++++++++++++++++++++++++++++++++++++++++++++++++++++++++++++++ 9:00 am (02/13/2018) - FBG  501mg % ->   Nov 70/30  60u 3:30pm (02/13/2018) -  10:00 pm(02/13/2018) - CBG  556 mg% -> Lantus 80u +  Nov 70/30   60u  ____200 u +++++++++++++++++++++++++++++++++++++++++++++++++++++++++++++++++++++++++++ 8:30 am (02/14/2018) - FBG 377 mg% ->   Nov 70/30  70u 12:00(02/14/2018)- CBG 451 mg%   Nov 70/30  80 u 6:00 pm (02/14/2018) - CBG 452 mg% ->   Nov 70/30  80 u 9:00 pm (02/14/2018) - CBG 428mg % -> Lantus 120 u ____350 u TOTAL for Friday  +++++++++++++++++++++++++++++++++++++++++++++++++++++++++++++++++++++++++++ 8:00 am (02/15/2018)  - FBG 288mg % ->    Nov 70/30  80 u 3:00 pm (02/15/2018) - CBG 436mg % ->  Nov 70/30  100u 7:00 pm (02/15/2018) - CBG 443 mg% ->  Nov 70/30  100 u  10:30 pm (02/15/2018) - CBG 401 mg% -> Lantus 120 u +  Nov 70/30  100 u   ____500 uTOTAL for Saturday +++++++++++++++++++++++++++++++++++++++++++++++++++++++++++++++++++++++++++ NO Piqray 8:00 am (02/16/2018) - FBG 232 mg% ->   Nov 70/30  80 u 3:00 pm (02/16/2018) - CBG 313 mg% ->   Nov 70/30  80 u   7:00 pm (02/16/2018) - CBG 184 mg% ->   Nov 70/30 20u   10:00 pm (02/16/2018) - CBG 243 mg% -> No Lantus       Nov 70/30 40 u  ____ 220 u TOTAL for Sunday ++++++++++++++++++++++++++++++++++++++++++++++++++++++++++++++++++++++++++++ 10:00 am (02/17/2018) - FBG 72 mg% ->  No Insulin  12:00 pm (02/17/2018) - CBG  202 mg% ->   No Insulin   4:00 pm (02/17/2018) - CBG  299 mg% ->at office visit  -       Nov 70/30  30 u At 5:30 pm 11:00 PM (02/17/2018) - CBG  221 mg%       Nov 70/30  15 u At 11:15 pm  ____ 45 units TOTAL for Mon  +++++++++++++++++++++++++++++++++++++++++++++++++++++++++++++++++++++++++++++ 8:30Am (02/18/2018) - FBG147 mg% ->  Nov 70/30 15 u  2:00 Pm (02/18/2018) - CBG 215mg % -> Nov 70/30 15 u 7:00 Pm (02/18/2018) - CBG 194 mg% -> Nov 70/30 30 u 10:00 Pm (02/18/2018) - CBG 145 mg% ->Lantus 20 u     ____ 80Units TOTAL for Tues +++++++++++++++++++++++++++++++++++++++++++++++++++++++++++++++++++++++++++++++ 8:00 am (02/19/2018) - FBG230Mg % ->  Nov 70/3035 u 2:00 Pm (02/19/2018) - CBG 98 Mg% ->  No Insulin 7:00 Pm (02/19/2018) - CBG183Mg % ->  Nov 70/3020  u 11:00 Pm (02/19/2018) - CBG139Mg % ->Lantus 25 u   ____   80 Units TOTAL for Wed ++++++++++++++++++++++++++++++++++++++++++++++++++++++++++++++++++++++++++++++++ 8:00 AM(02/20/2018) - FBG122Mg % -> Nov 70/3020u 1:00PM(02/20/2018) - CBG 71Mg % -> No Insulin 6:00 PM (02/20/2018) - CBG103Mg % -> No Insulin 10:00 PM (02/20/2018) - CBG154Mg % ->Lantus 20 u   ____40Units totalfor Thurs ++++++++++++++++++++++++++++++++++++++++++++++++++++++++++++++++++++++++++++++++ Restart Piqray 150 mg - 1 tablet  8:00  AM (02/21/2018) - FBG  176Mg %  -> Nov 70/30  15u  2:30PM(02/21/2018) - CBG 149 Mg% ->     Episode of N/V 2 hr after took [place] &Metformin at 2:30 pm  6:30 PM (02/21/2018) - CBG  261Mg %  ->  Nov 70/30  15u 10:00 PM (02/21/2018) - CBG 236Mg % ->Lantus25 u   ____ 55Units totalfor  Fri +++++++++++++++++++++++++++++++++++++++++++++++++++++++++++++++++++++++++++++++  Piqray 150 mg 1 tablet (day 2)(Metformin on hold ->) 8:00  AM  (02/22/2018)  -  FBG  145Mg % ->  Nov 70/30  10u  2:30 PM(02/22/2018) - CBG 112 Mg% ->      6:30 PM (02/22/2018)  -  CBG 213 Mg% ->  Nov 70/30  10u 10:00 PM  (02/22/2018)  -  CBG 235Mg % ->Lantus 25u   ____  45Units total for Sat ++++++++++++++++++++++++++++++++++++++++++++++++++++++++++++++++++++++++++++++++  Piqray 150 mg - 1 tablet (day3) 8:00  AM  (02/23/2018)  -  FBG  171Mg % ->  Nov 70/30   10u  2:00 PM (02/23/2018) - CBG  207Mg % ->     Nov 70/30  10u  6:00 PM  (02/23/2018)  -  CBG  191Mg % ->   Nov 70/30   10u 10:00 PM  (02/23/2018)  -  CBG  245Mg % ->Lantus35 u  Nov 70/30  10u  _____  75Units total  forSun +++++++++++++++++++++++++++++++++++++++++++++++++++++++++++++++++++++++++++++++++  Piqray 150 mg - 1 tablet (day4) 8:00  AM  (02/24/2018)  -  FBG  148 Mg% ->       2:30 PM (02/24/2018)  -  CBG  274Mg % ->    Nov 70/30   30u  6:00 PM  (02/24/2018)  -  CBG  347Mg % ->   Nov 70/30  30u 10:00 PM  (02/24/2018)  -  CBG  419Mg % ->Lantus60u   Nov 70/30  20u  _____ 140Units total forMon +++++++++++++++++++++++++++++++++++++++++++++++++++++++++++++++++++++++++++++++++  Piqray 150 mg - 1 tablet (day5) 8:00  AM  (02/25/2018)  -  FBG 169 Mg% ->      Nov 70/30 30u  2:30 PM (02/25/2018)  -  CBG  176Mg % ->       Nov 70/30  30u  6:00 PM  (02/25/2018)  -  CBG  251 Mg% ->   Nov 70/30  30u 10:00  PM  (02/25/2018)   -  CBG  286Mg % -> Lantus 80u  Nov 70/30  30 u  _____ 200Units total  forTues +++++++++++++++++++++++++++++++++++++++++++++++++++++++++++++++++++++++++++++++++  Piqray 150 mg - 1 tablet (day6) 8:00  AM  (02/26/2018)  - FBG 93 Mg%  ->   Nov 70/30 25u 2:30 PM  (02/26/2018)  -  CBG  265 Mg% ->      Nov 70/30 40u 6:00 PM   (02/26/2018)  - CBG  313 Mg% ->    Nov 70/30  50u 10:00 PM   (02/26/2018)   -   CBG  389Mg % -> Lantus100 u Nov 70/30 40u _____ 255Units total forWed +++++++++++++++++++++++++++++++++++++++++++++++++++++++++++++++++++++++++++++++++  Piqray 150 mg - 1 tablet (day7)  8:00  AM  (02/27/2018)  -  FBG  293Mg %  ->     Nov 70/30  60 u 1:30 PM  (02/27/2018)  -  CBG   117Mg %  ->     Nov 70/30 40u 5:30  PM   (02/27/2018)  -  CBG    81Mg %  ->     10:00 PM   (02/27/2018)   -   CBG   92Mg %  -> Lantus 20 u   _____  120Units total  forThurs +++++++++++++++++++++++++++++++++++++++++++++++++++++++++++++++++++++++++++++++++  Piqray 150 mg - 1 tablet (day8) 8:00  AM  (02/28/2018)  -  FBG  121Mg %   ->    Nov 70/30 10u 1:30 PM  (02/28/2018)  -   CBG  237Mg %  ->     Nov 70/30 25u 5:30  PM   (02/28/2018)  -  CBG   269 Mg%   ->     Nov 70/30 35u 10:00 PM   (02/28/2018)   -   CBG  267Mg %   ->Lantus50u   Nov 70/30 15u   _____  135Units total forFri +++++++++++++++++++++++++++++++++++++++++++++++++++++++++++++++++++++++++++++++++  Piqray 150 mg - 1 tablet (day9) 8:30  AM  (03/01/2018)  -  FBG  307Mg %   ->   Nov 70/30 40u 1:30 PM  (03/01/2018)  -   CBG  157Mg %   ->   Nov 70/30 20u 5:30  PM   (03/01/2018)  -  CBG   139Mg %   ->   Nov 70/30 25u 10:00 PM   (03/01/2018)   -   CBG  193Mg %  ->Lantus70u  Nov 70/30 10u  _____  165Units total  forSat ++++++++++++++++++++++++++++++++++++++++++++++++++++++  Piqray 150 mg - 1 tablet (day10) 8:30  AM  (03/02/2018)  -   FBG  286Mg %   ->    Nov 70/30 35u 1:30 PM  (03/02/2018)  -   CBG  306 Mg%   ->    Nov 70/30 60u 5:30  PM   (03/02/2018)  -   CBG   359 Mg%   ->    Nov 70/30 60u 10:00 PM   (03/02/2018)   -   CBG   330Mg %  ->Lantus100u+ Nov 70/30 60u   _____  315Units total forSun +++++++++++++++++++++++++++++++++++++++++++++++++++++++++++++++++++++++++++++++++++++++++++++++  Piqray 150 mg - 1 tablet (day11) 8:30  AM  (03/03/2018)  -   FBG  121Mg %  ->     Nov 70/30 20u 1:30 PM  (03/03/2018)  -   CBG   112 Mg%   ->     Nov 70/30 20u 5:30 PM  (03/03/2018)  -   CBG   219Mg %   ->    Nov 70/30 35u 10:00 PM   (03/03/2018)   -   CBG   257Mg %  ->Lantus 120u + Nov 70/30 40u  _____  235Units total  forMon +++++++++++++++++++++++++++++++++++++++++++++++++++++++++++++++++++++++++++++++++++++++++++++++  Piqray 150 mg - 1 tablet (day12) 8:30  AM  (03/04/2018)  -   FBG  112Mg %  ->   Nov 70/30 20u 1:30 PM  (03/04/2018)  -   CBG    97 Mg%   ->     5:30 PM  (03/04/2018)  -   CBG   198Mg %   ->    Nov 70/30 10u 10:00 PM   (03/04/2018)   -   CBG   211Mg %  ->Lantus 110 u + Nov 70/30 10u  _____  150Units total forTues +++++++++++++++++++++++++++++++++++++++++++++++++++++++++++++++++++++++++++++++++++++++++++++++  Piqray 150 mg - 1 tablet (day13) 8:30  AM  (03/05/2018)  -   FBG   126  Mg%  ->    Nov 70/30  20 u 1:30 PM  (03/05/2018)  -   CBG  82 Mg%   ->     6:00 PM  (03/05/2018)  -   CBG   188 Mg%   ->     Nov 70/30  20 u 10:00 PM    (03/05/2018)   -   CBG   207 Mg%  ->Lantus 110 u + Nov 70/30  10 u  _____ 160 Units total  forWed +++++++++++++++++++++++++++++++++++++++++++++++++++++++++++++++++++++++++++++++++++++++++++++++  Piqray 150 mg - 1 tablet (day14) 8:30  AM   (03/06/2018)  -   FBG 132  Mg%  ->   Nov 70/30  15 u 1:30 PM  (03/06/2018)  -   CBG 229 Mg%   ->    Nov 70/30  20 u 6:00 PM  (03/06/2018)  -   CBG 187Mg %   ->      10:00 PM    (03/06/2018)   -   CBG 151 Mg%  ->Lantus 110u + Nov 70/30  25 u  _____ 170Units total forThurs +++++++++++++++++++++++++++++++++++++++++++++++++++++++++++++++++++++++++++++++++++++++++++++++  Piqray 150 mg - 1 tablet (day15) 8:30  AM   (03/07/2018)  -   FBG  235Mg %   ->    Nov 70/30  25u 1:30 PM  (03/07/2018)  -   CBG  202Mg %   ->    Nov 70/30  25u 6:00 PM  (03/07/2018)  -   CBG  190Mg %   ->    Nov 70/3025u 10:00 PM    (03/07/2018)   -   CBG  185Mg %  ->Lantus 110u  +   _____ 185Units total  forFri +++++++++++++++++++++++++++++++++++++++++++++++++++++++++++++++++++++++++++++++++++++++++++++++  Piqray 150 mg - 1 tablet (day16 8:30  AM   (03/08/2018)  -   FBG  120Mg %   ->    Nov 70/30  15u 1:30 PM  (03/08/2018)  -   CBG   105 Mg%   ->      6:00 PM  (03/08/2018)  -   CBG  103Mg %   ->     10:00 PM    (03/08/2018)   -   CBG  188Mg %  ->Lantus 110u  +   _____  125Units total for Sat ++++++++++++++++++++++++++++++++++++++++++++++++++++++++++++++++++++++++++++++++++++++++++++++++  Piqray 150 mg - 1 tablet (day17) 8:30  AM   (03/09/2018)  -   FBG  140 Mg%   ->   Nov 70/30  15 u 1:30 PM  (03/09/2018)  -    CBG   114 Mg%   ->      6:00 PM  (03/09/2018)  -    CBG  219 Mg%   ->     10:00 PM    (03/09/2018)   -    CBG  255 Mg%  ->Lantus120u  +   Nov 70/30 10 u  _____ 145Units total for  Sun ++++++++++++++++++++++++++++++++++++++++++++++++++++++++++++++++++++++++++++++++++++++++++++++++  Piqray 150 mg - 1 tablet (day18) 8:30  AM   (03/10/2018)  -   FBG  224Mg %   ->     Nov 70/30 25 u 1:30 PM  (03/10/2018)  -    CBG   87 Mg%   ->         6:00 PM   (03/10/2018)  -   CBG  79 Mg%   ->       10:00 PM    (03/10/2018)   -   CBG  197Mg %  ->Lantus120u  +   Nov 70/30 20 u  _____ 165Units total for Mon ++++++++++++++++++++++++++++++++++++++++++++++++++++++++++++++++++++++++++++++++++++++++++++++++  Piqray 150 mg - 1 tablet (day19) 8:30  AM   (03/11/2018)   -  FBG   192 Mg%   ->       Nov 70/30  15 u 1:30 PM  (03/11/2018)  -    CBG   82 Mg%   ->      0   6:00 PM   (03/11/2018)  -   CBG  220Mg %   ->     Nov 70/3020 u 10:00 PM    (03/11/2018)   -   CBG  232Mg %  ->Lantus120u  +  Nov 70/30 10 u   _____ 165Units total  for Tues ++++++++++++++++++++++++++++++++++++++++++++++++++++++++++++++++++++++++++++++++++++++++++++++++  Piqray 150 mg - 1 tablet (day20) 8:30  AM   (03/12/2018)   -    FBG   231 Mg%   ->      Nov 70/30   20 u 1:30 PM  (03/12/2018)  -    CBG  184 Mg%    ->     Nov 70/30  25 u 6:00 PM   (03/12/2018)  -   CBG   88Mg %    ->     0  10:00 PM    (03/12/2018)   -   CBG  133Mg %   ->Lantus 110u +     _____ 155Units total for  Wed ++++++++++++++++++++++++++++++++++++++++++++++++++++++++++++++++++++++++++++++++++++++++++++++++  Piqray 150 mg - 1 tablet (day21) 8:30  AM   (03/13/2018)   -       FBG     137 Mg%   ->      Nov 70/30  10 u 1:30 PM  (03/13/2018)  -      CBG      89 Mg%    ->        6:00 PM   (03/13/2018)  -     CBG    144Mg %    ->       10:00 PM    (03/13/2018)   -     CBG  123    Mg%   -> Lantus   u    +         _____ Units total for  Thurs ++++++++++++++++++++++++++++++++++++++++++++++++++++++++++++++++++++++++++++++++++++++++++++++++  Piqray 150 mg - 1 tablet (day22) 8:30  AM   (03/14/2018)   -       FBG    196    Mg%    ->      Nov 70/30  15 u  1:30 PM  (03/14/2018)  -      CBG    139     Mg%    ->     Nov 70/30  10  u 6:00 PM   (03/14/2018)  -     CBG          Mg%    ->     Nov 70/30  10:00 PM    (03/14/2018)   -     CBG            Mg%   -> Lantus   u    +     Nov 70/30    _____ Units total for Fri ++++++++++++++++++++++++++++++++++++++++++++++++++++++++++++++++++++++++++++++++++++++++++++++++

## 2018-03-15 ENCOUNTER — Other Ambulatory Visit: Payer: Self-pay | Admitting: Internal Medicine

## 2018-03-15 NOTE — Progress Notes (Addendum)
(02/08/2018) - FBG 357 mg% ->  Nov 70/30  15 u  6:30 pm (02/08/2018) - CBG 482 mg% ->  Nov 70/30 30 u 11:00 pm (02/08/2018) - CBG 486 mg% ->Lantus 20 u   ____65 u +++++++++++++++++++++++++++++++++++++++++++++++++++++++++++++++++++++++++ 8:30 am (02/09/2018) - FBG 331 mg% ->  Nov 70/30 45 u  6:30 pm (02/09/2018) - CBG 410 mg% ->  Nov 70/30  50 u 11:00pm (02/09/2018) -CBG426 mg% -> Lantus 20 u+ Nov 70/30  20 u  ____ 135 u  ++++++++++++++++++++++++++++++++++++++++++++++++++++++++++++++++++++++++++ 9:00 am (02/11/2018) - FBG 236mg % ->  Nov 70/30 20u 6:30 pm (02/11/2018) - CBG 329mg % ->  Nov 70/30  20u  11:00 pm (02/11/2018) - CBG428 mg% -> ->  Nov 70/30  60 u  ____100 u  ++++++++++++++++++++++++++++++++++++++++++++++++++++++++++++++++++++++++++ 9:30 am (02/12/2018) - FBG 332  mg% -> Lantus 20 u +    Nov 70/30 60 u  4:00 pm (02/12/2018) - CBG  354 mg% -> 7:00 pm (02/12/2018) - CBG 254 mg% ->  Nov 70/30  20 u  11:00 pm (02/12/2018) - CBG199mg %-> Lantus 60 u  ____ 160 u +++++++++++++++++++++++++++++++++++++++++++++++++++++++++++++++++++++++++++ 9:00 am (02/13/2018) - FBG  501mg % ->   Nov 70/30  60u 3:30pm (02/13/2018) -  10:00 pm(02/13/2018) - CBG  556 mg% -> Lantus 80u +  Nov 70/30   60u  ____200 u +++++++++++++++++++++++++++++++++++++++++++++++++++++++++++++++++++++++++++ 8:30 am (02/14/2018) - FBG 377 mg% ->   Nov 70/30  70u 12:00(02/14/2018)- CBG 451 mg%   Nov 70/30  80 u 6:00 pm (02/14/2018) - CBG 452 mg% ->   Nov 70/30  80 u 9:00 pm (02/14/2018) - CBG 428mg % -> Lantus 120 u ____350 u TOTAL for Friday  +++++++++++++++++++++++++++++++++++++++++++++++++++++++++++++++++++++++++++ 8:00 am (02/15/2018)  - FBG 288mg % ->   Nov 70/30  80 u 3:00 pm (02/15/2018) - CBG 436mg % ->  Nov 70/30  100u 7:00 pm (02/15/2018) - CBG 443 mg% ->  Nov 70/30  100 u  10:30 pm (02/15/2018) - CBG 401 mg% -> Lantus 120 u +  Nov 70/30  100 u   ____500 uTOTAL for Saturday +++++++++++++++++++++++++++++++++++++++++++++++++++++++++++++++++++++++++++ NO Piqray 8:00 am (02/16/2018) - FBG 232 mg% ->   Nov 70/30  80 u 3:00 pm (02/16/2018) - CBG 313 mg% ->   Nov 70/30  80 u   7:00 pm (02/16/2018) - CBG 184 mg% ->   Nov 70/30 20u   10:00 pm (02/16/2018) - CBG 243 mg% -> No Lantus   Nov 70/30 40 u  ____ 220 u TOTAL for Sunday ++++++++++++++++++++++++++++++++++++++++++++++++++++++++++++++++++++++++++++ 10:00 am (02/17/2018) - FBG 72 mg% ->  No Insulin  12:00 pm (02/17/2018) - CBG  202 mg% ->   No Insulin   4:00 pm (02/17/2018) - CBG  299 mg% ->at office visit  -    Nov 70/30  30 u At 5:30 pm 11:00 PM (02/17/2018) - CBG  221 mg%    Nov 70/30  15 u At 11:15 pm  ____ 45 units TOTAL for Mon  +++++++++++++++++++++++++++++++++++++++++++++++++++++++++++++++++++++++++++++ 8:30Am (02/18/2018) - FBG147 mg% ->  Nov 70/30 15 u  2:00 Pm (02/18/2018) - CBG 215mg % -> Nov 70/30 15 u 7:00 Pm (02/18/2018) - CBG 194 mg% -> Nov 70/30 30 u 10:00 Pm (02/18/2018) - CBG 145 mg% ->Lantus 20 u     ____ 80Units TOTAL for Tues +++++++++++++++++++++++++++++++++++++++++++++++++++++++++++++++++++++++++++++++ 8:00 am (02/19/2018) - FBG230Mg % ->  Nov 70/3035 u 2:00 Pm (02/19/2018) - CBG 98 Mg% ->  No Insulin 7:00 Pm (02/19/2018) - CBG183Mg % ->  Nov 70/3020 u 11:00 Pm (02/19/2018) - CBG139Mg % ->Lantus 25 u  ____   80 Units TOTAL for Wed ++++++++++++++++++++++++++++++++++++++++++++++++++++++++++++++++++++++++++++++++ 8:00 AM(02/20/2018) - FBG122Mg % -> Nov 70/3020u 1:00PM(02/20/2018) - CBG 71Mg % -> No Insulin 6:00 PM (02/20/2018) - CBG103Mg % -> No Insulin 10:00 PM (02/20/2018) - CBG154Mg % ->Lantus 20 u   ____40Units totalfor Thurs ++++++++++++++++++++++++++++++++++++++++++++++++++++++++++++++++++++++++++++++++ Restart Piqray 150 mg - 1 tablet  8:00  AM (02/21/2018) - FBG  176Mg %  -> Nov 70/30  15u  2:30PM(02/21/2018) - CBG 149 Mg% ->     Episode of N/V 2 hr after took [employer] &Metformin at 2:30 pm  6:30 PM (02/21/2018) - CBG  261Mg %  ->  Nov 70/30  15u 10:00 PM (02/21/2018) - CBG 236Mg % ->Lantus25 u   ____ 55Units totalfor  Fri +++++++++++++++++++++++++++++++++++++++++++++++++++++++++++++++++++++++++++++++  Piqray 150 mg 1 tablet (day 2)(Metformin on hold ->) 8:00  AM  (02/22/2018)  -  FBG  145Mg % ->  Nov 70/30  10u  2:30 PM(02/22/2018) - CBG 112 Mg% ->      6:30 PM (02/22/2018)  -  CBG 213 Mg% ->  Nov 70/30  10u 10:00 PM  (02/22/2018)  -  CBG 235Mg % ->Lantus 25u   ____  45Units total for Sat ++++++++++++++++++++++++++++++++++++++++++++++++++++++++++++++++++++++++++++++++  Piqray 150 mg - 1 tablet (day3) 8:00  AM  (02/23/2018)  -  FBG  171Mg % ->  Nov 70/30   10u  2:00 PM (02/23/2018) - CBG  207Mg % ->     Nov 70/30  10u  6:00 PM  (02/23/2018)  -  CBG  191Mg % ->   Nov 70/30   10u 10:00 PM  (02/23/2018)  -  CBG  245Mg % ->Lantus35 u  Nov 70/30  10u  _____  75Units total  forSun +++++++++++++++++++++++++++++++++++++++++++++++++++++++++++++++++++++++++++++++++  Piqray 150 mg - 1 tablet (day4) 8:00  AM  (02/24/2018)  -  FBG  148 Mg% ->       2:30 PM (02/24/2018)  -  CBG  274Mg % ->    Nov 70/30   30u  6:00 PM  (02/24/2018)  -  CBG  347Mg % ->   Nov 70/30  30u 10:00 PM  (02/24/2018)  -  CBG  419Mg % ->Lantus60u   Nov 70/30  20u  _____ 140Units total forMon +++++++++++++++++++++++++++++++++++++++++++++++++++++++++++++++++++++++++++++++++  Piqray 150 mg - 1 tablet (day5) 8:00  AM  (02/25/2018)  -  FBG 169 Mg% ->      Nov 70/30 30u  2:30 PM (02/25/2018)  -  CBG  176Mg % ->       Nov 70/30  30u  6:00 PM  (02/25/2018)  -  CBG  251 Mg% ->   Nov 70/30  30u 10:00  PM  (02/25/2018)   -  CBG  286Mg % -> Lantus 80u  Nov 70/30  30 u  _____ 200Units total  forTues +++++++++++++++++++++++++++++++++++++++++++++++++++++++++++++++++++++++++++++++++  Piqray 150 mg - 1 tablet (day6) 8:00  AM  (02/26/2018)  - FBG 93 Mg%  ->   Nov 70/30 25u 2:30 PM  (02/26/2018)  -  CBG  265 Mg% ->      Nov 70/30 40u 6:00 PM   (02/26/2018)  - CBG  313 Mg% ->    Nov 70/30  50u 10:00 PM   (02/26/2018)   -   CBG  389Mg % -> Lantus100 u Nov 70/30 40u _____ 255Units total forWed +++++++++++++++++++++++++++++++++++++++++++++++++++++++++++++++++++++++++++++++++  Piqray 150 mg - 1 tablet (day7)  8:00  AM  (02/27/2018)  -  FBG  293Mg %  ->     Nov 70/30  60 u 1:30 PM  (02/27/2018)  -   CBG   117Mg %  ->  Nov 70/30 40u 5:30  PM   (02/27/2018)  -  CBG    81Mg %  ->     10:00 PM   (02/27/2018)   -   CBG   92Mg %  -> Lantus 20 u   _____  120Units total  forThurs +++++++++++++++++++++++++++++++++++++++++++++++++++++++++++++++++++++++++++++++++  Piqray 150 mg - 1 tablet (day8) 8:00  AM  (02/28/2018)  -  FBG  121Mg %   ->    Nov 70/30 10u 1:30 PM  (02/28/2018)  -   CBG  237Mg %  ->     Nov 70/30 25u 5:30  PM   (02/28/2018)  -  CBG   269 Mg%   ->     Nov 70/30 35u 10:00 PM   (02/28/2018)   -   CBG  267Mg %   ->Lantus50u   Nov 70/30 15u   _____  135Units total forFri +++++++++++++++++++++++++++++++++++++++++++++++++++++++++++++++++++++++++++++++++  Piqray 150 mg - 1 tablet (day9) 8:30  AM  (03/01/2018)  -  FBG  307Mg %   ->   Nov 70/30 40u 1:30 PM  (03/01/2018)  -   CBG  157Mg %   ->   Nov 70/30 20u 5:30  PM   (03/01/2018)  -  CBG   139Mg %   ->   Nov 70/30 25u 10:00 PM   (03/01/2018)   -   CBG  193Mg %  ->Lantus70u  Nov 70/30 10u  _____  165Units total  forSat ++++++++++++++++++++++++++++++++++++++++++++++++++++++  Piqray 150 mg - 1 tablet (day10) 8:30  AM  (03/02/2018)  -   FBG  286Mg %   ->    Nov 70/30 35u 1:30 PM  (03/02/2018)  -   CBG  306 Mg%   ->    Nov 70/30 60u 5:30  PM   (03/02/2018)  -   CBG   359 Mg%   ->    Nov 70/30 60u 10:00 PM   (03/02/2018)   -   CBG   330Mg %  ->Lantus100u+ Nov 70/30 60u   _____  315Units total forSun +++++++++++++++++++++++++++++++++++++++++++++++++++++++++++++++++++++++++++++++++++++++++++++++  Piqray 150 mg - 1 tablet (day11) 8:30  AM  (03/03/2018)  -   FBG  121Mg %  ->     Nov 70/30 20u 1:30 PM  (03/03/2018)  -   CBG   112 Mg%   ->     Nov 70/30 20u 5:30 PM  (03/03/2018)  -   CBG   219Mg %   ->    Nov 70/30 35u 10:00 PM   (03/03/2018)   -   CBG   257Mg %  ->Lantus 120u + Nov 70/30 40u  _____  235Units total  forMon +++++++++++++++++++++++++++++++++++++++++++++++++++++++++++++++++++++++++++++++++++++++++++++++  Piqray 150 mg - 1 tablet (day12) 8:30  AM  (03/04/2018)  -   FBG  112Mg %  ->   Nov 70/30 20u 1:30 PM  (03/04/2018)  -   CBG    97 Mg%   ->     5:30 PM  (03/04/2018)  -   CBG   198Mg %   ->    Nov 70/30 10u 10:00 PM   (03/04/2018)   -   CBG   211Mg %  ->Lantus 110 u + Nov 70/30 10u  _____  150Units total forTues +++++++++++++++++++++++++++++++++++++++++++++++++++++++++++++++++++++++++++++++++++++++++++++++  Piqray 150 mg - 1 tablet (day13) 8:30  AM  (03/05/2018)  -   FBG   126  Mg%  ->    Nov 70/30  20 u 1:30 PM  (03/05/2018)  -   CBG    82 Mg%   ->  6:00 PM  (03/05/2018)  -   CBG   188 Mg%   ->     Nov 70/30  20 u 10:00 PM    (03/05/2018)   -   CBG   207 Mg%  ->Lantus 110 u + Nov 70/30  10 u  _____ 160 Units total  forWed +++++++++++++++++++++++++++++++++++++++++++++++++++++++++++++++++++++++++++++++++++++++++++++++  Piqray 150 mg - 1 tablet (day14) 8:30  AM   (03/06/2018)  -   FBG 132  Mg%  ->   Nov 70/30  15 u 1:30 PM  (03/06/2018)  -   CBG 229 Mg%   ->    Nov 70/30  20 u 6:00 PM  (03/06/2018)  -   CBG 187Mg %   ->      10:00 PM    (03/06/2018)   -   CBG 151 Mg%  ->Lantus 110u + Nov 70/30  25 u  _____ 170Units total forThurs +++++++++++++++++++++++++++++++++++++++++++++++++++++++++++++++++++++++++++++++++++++++++++++++  Piqray 150 mg - 1 tablet (day15) 8:30  AM   (03/07/2018)  -   FBG  235Mg %   ->    Nov 70/30  25u 1:30 PM  (03/07/2018)  -   CBG  202Mg %   ->    Nov 70/30  25u 6:00 PM  (03/07/2018)  -   CBG  190Mg %   ->    Nov 70/3025u 10:00 PM    (03/07/2018)   -   CBG  185Mg %  ->Lantus 110u  +   _____ 185Units total  forFri +++++++++++++++++++++++++++++++++++++++++++++++++++++++++++++++++++++++++++++++++++++++++++++++  Piqray 150 mg - 1 tablet (day16 8:30  AM   (03/08/2018)  -   FBG  120Mg %   ->    Nov 70/30  15u 1:30 PM  (03/08/2018)  -   CBG   105 Mg%   ->      6:00 PM  (03/08/2018)  -   CBG  103Mg %   ->     10:00 PM    (03/08/2018)   -   CBG  188Mg %  ->Lantus 110u  +   _____  125Units total for Sat +++++++++++++++++++++++++++++++++++++++++++++++++++++++++++++++++++++++++++++++++++++++++++++++  Piqray 150 mg - 1 tablet (day17) 8:30  AM   (03/09/2018)  -   FBG  140 Mg%   ->   Nov 70/30  15 u 1:30 PM  (03/09/2018)  -    CBG   114 Mg%   ->      6:00 PM  (03/09/2018)  -    CBG  219 Mg%   ->     10:00 PM    (03/09/2018)   -    CBG  255 Mg%  ->Lantus120u  +   Nov 70/30 10 u  _____ 145Units total for  Sun +++++++++++++++++++++++++++++++++++++++++++++++++++++++++++++++++++++++++++++++++++++++++++++++  Piqray 150 mg - 1 tablet (day18) 8:30  AM   (03/10/2018)  -   FBG  224Mg %   ->    Nov 70/30 25 u 1:30 PM  (03/10/2018)  -    CBG   87 Mg%   ->       6:00 PM   (03/10/2018)  -   CBG  79 Mg%   ->      10:00 PM    (03/10/2018)   -   CBG  197Mg %  ->Lantus120u  + Nov 70/30 20 u  _____ 165Units total for Mon +++++++++++++++++++++++++++++++++++++++++++++++++++++++++++++++++++++++++++++++++++++++++++++++  Piqray 150 mg - 1 tablet (day19) 8:30  AM   (03/11/2018)  -   FBG   192 Mg%   ->      Nov 70/30  15 u 1:30 PM  (03/11/2018)  -    CBG   82 Mg%   ->     0   6:00 PM   (03/11/2018)  -   CBG  220Mg %   ->     Nov 70/3020 u 10:00 PM    (03/11/2018)   -   CBG  232Mg %  ->Lantus120u  +  Nov 70/30 10 u   _____ 165Units total for  Tues +++++++++++++++++++++++++++++++++++++++++++++++++++++++++++++++++++++++++++++++++++++++++++++++  Piqray 150 mg - 1 tablet (day20) 8:30  AM   (03/12/2018)  -    FBG   231 Mg%   ->      Nov 70/30  20 u 1:30 PM  (03/12/2018)  -    CBG  184 Mg%    ->     Nov 70/30  25 u 6:00 PM   (03/12/2018)  -   CBG   88Mg %    ->     0  10:00 PM    (03/12/2018)   -   CBG  133Mg %   ->Lantus 110u +    _____ 155Units total for Wed +++++++++++++++++++++++++++++++++++++++++++++++++++++++++++++++++++++++++++++++++++++++++++++++  Piqray 150 mg - 1 tablet (day21) 8:30  AM   (03/13/2018)   -   FBG   179Mg %   ->      Nov 70/30  10 u 1:30 PM  (03/13/2018)  -    CBG  89 Mg%    ->      6:00 PM   (03/13/2018)  -   CBG   144Mg %    ->     10:00 PM    (03/13/2018)   -    CBG  123Mg %   -> Lantus110u +  _____ 120 Units total for  Thurs +++++++++++++++++++++++++++++++++++++++++++++++++++++++++++++++++++++++++++++++++++++++++++++++  Piqray 150 mg - 1 tablet (day22) 8:30  AM   (03/14/2018)   -   FBG   196Mg %   ->    Nov 70/30  15 u  1:30 PM  (03/14/2018)  -    CBG  139Mg %    ->    Nov 70/30  10  u 6:00 PM   (03/14/2018)  -   CBG   211Mg %    ->     Nov 70/30   15 u 10:00 PM    (03/14/2018)   -    CBG  230Mg %   ->Lantus120 u   + Nov 70/30  10 u       _____ 170Units total for Fri +++++++++++++++++++++++++++++++++++++++++++++++++++++++++++++++++++++++++++++++++++++++++++++++  Piqray 150 mg - 1 tablet (day23) 8:30  AM   (03/15/2018)   -   FBG   152 Mg%   ->   Nov 70/30  15 u  1:30 PM  (03/15/2018)  -    CBG     79 Mg%    ->          6:00 PM   (03/15/2018)  -   CBG   180Mg %    ->     Nov 70/30    10 u 10:00 PM    (03/15/2018)   -    CBG  193 Mg%  -> Lantus 120u +     _____ 145 Units total for  Sat +++++++++++++++++++++++++++++++++++++++++++++++++++++++++++++++++++++++++++++++++++++++++++++++  Piqray 150 mg - 1 tablet (day24) 8:30  AM   (03/16/2018)   -   FBG  150  Mg%   ->   Nov 70/30   10 u      1:30 PM  (03/16/2018)  -  CBG  139    Mg%    ->              6:00 PM   (03/16/2018)  -   CBG   189   Mg%    ->     Nov 70/30     10 u  10:00 PM    (03/16/2018)   -    CBG    Mg%  -> Lantus110 u +   _____ 120 Units total for Sun +++++++++++++++++++++++++++++++++++++++++++++++++++++++++++++++++++++++++++++++++++++++++++++++

## 2018-03-17 ENCOUNTER — Other Ambulatory Visit: Payer: Self-pay | Admitting: Internal Medicine

## 2018-03-17 ENCOUNTER — Other Ambulatory Visit: Payer: Self-pay | Admitting: Hematology and Oncology

## 2018-03-17 ENCOUNTER — Telehealth: Payer: Self-pay

## 2018-03-17 ENCOUNTER — Other Ambulatory Visit: Payer: Self-pay

## 2018-03-17 DIAGNOSIS — C50511 Malignant neoplasm of lower-outer quadrant of right female breast: Secondary | ICD-10-CM

## 2018-03-17 DIAGNOSIS — C7800 Secondary malignant neoplasm of unspecified lung: Secondary | ICD-10-CM

## 2018-03-17 DIAGNOSIS — C7951 Secondary malignant neoplasm of bone: Secondary | ICD-10-CM

## 2018-03-17 MED ORDER — FENTANYL 25 MCG/HR TD PT72: 25.0000 ug | MEDICATED_PATCH | TRANSDERMAL | 0 refills | Status: DC

## 2018-03-17 MED ORDER — HYDROMORPHONE HCL 2 MG PO TABS: 2.0000 mg | ORAL_TABLET | ORAL | 0 refills | Status: DC | PRN

## 2018-03-17 MED FILL — fentaNYL 25 MCG/HR PT72: 25 | 15 days supply | Qty: 5 | Fill #0

## 2018-03-17 MED FILL — HYDROmorphone HCL 2 MG TABS: 2 | 15 days supply | Qty: 90 | Fill #0

## 2018-03-17 MED FILL — ACCU-CHEK AVIVA PLUS TEST S: 25 days supply | Qty: 100 | Fill #1

## 2018-03-17 MED FILL — LETROZOLE 2.5 MG TABLET: 2.5 | 90 days supply | Qty: 90 | Fill #0

## 2018-03-17 NOTE — Progress Notes (Signed)
Complete from 02/08/2018 last on 03/15/2018 +++++++++++++++++++++++++++++++++++++++++++++++++++++++++++++++++++++++++++++++++++++++++++++++  Piqray 150 mg - 1 tablet (day14) 8:30  AM   (03/06/2018)  -   FBG 132  Mg%  ->   Nov 70/30  15 u 1:30 PM  (03/06/2018)  -   CBG 229 Mg%   ->    Nov 70/30  20 u 6:00 PM  (03/06/2018)  -   CBG 187Mg %   ->      10:00 PM    (03/06/2018)   -   CBG 151 Mg%  ->Lantus 110u + Nov 70/30  25 u  _____ 170Units total forThurs +++++++++++++++++++++++++++++++++++++++++++++++++++++++++++++++++++++++++++++++++++++++++++++++  Piqray 150 mg - 1 tablet (day15) 8:30  AM   (03/07/2018)  -   FBG  235Mg %   ->    Nov 70/30  25u 1:30 PM  (03/07/2018)  -   CBG  202Mg %   ->    Nov 70/30  25u 6:00 PM  (03/07/2018)  -   CBG  190Mg %   ->    Nov 70/3025u 10:00 PM    (03/07/2018)   -   CBG  185Mg %  ->Lantus 110u  +    _____ 185Units total forFri +++++++++++++++++++++++++++++++++++++++++++++++++++++++++++++++++++++++++++++++++++++++++++++++  Piqray 150 mg - 1 tablet (day16 8:30  AM   (03/08/2018)  -   FBG  120Mg %   ->    Nov 70/30  15u 1:30 PM  (03/08/2018)  -   CBG   105 Mg%   ->      6:00 PM  (03/08/2018)  -   CBG  103Mg %   ->     10:00 PM    (03/08/2018)   -   CBG  188Mg %  ->Lantus 110u  +   _____  125Units total for Sat +++++++++++++++++++++++++++++++++++++++++++++++++++++++++++++++++++++++++++++++++++++++++++++++  Piqray 150 mg - 1 tablet (day17) 8:30  AM   (03/09/2018)  -   FBG  140 Mg%   ->   Nov 70/30  15 u 1:30 PM  (03/09/2018)  -    CBG   114 Mg%   ->      6:00 PM  (03/09/2018)  -    CBG  219 Mg%   ->     10:00 PM    (03/09/2018)   -    CBG  255 Mg%  ->Lantus120u  +   Nov 70/30 10 u  _____ 145Units  total for Sun +++++++++++++++++++++++++++++++++++++++++++++++++++++++++++++++++++++++++++++++++++++++++++++++  Piqray 150 mg - 1 tablet (day18) 8:30  AM   (03/10/2018)  -   FBG  224Mg %   ->    Nov 70/30 25 u 1:30 PM  (03/10/2018)  -    CBG   87 Mg%   ->       6:00 PM   (03/10/2018)  -   CBG  79 Mg%   ->      10:00 PM    (03/10/2018)   -   CBG  197Mg %  ->Lantus120u  + Nov 70/30 20 u  _____ 165Units total for Mon +++++++++++++++++++++++++++++++++++++++++++++++++++++++++++++++++++++++++++++++++++++++++++++++  Piqray 150 mg - 1 tablet (day19) 8:30  AM   (03/11/2018)  -   FBG   192 Mg%   ->      Nov 70/30  15 u 1:30 PM  (03/11/2018)  -    CBG   82 Mg%   ->     0   6:00 PM   (03/11/2018)  -   CBG  220Mg %   ->     Nov 70/3020 u 10:00 PM    (  03/11/2018)   -   CBG  232Mg %  ->Lantus120u  +  Nov 70/30 10 u   _____ 165Units  total for Tues +++++++++++++++++++++++++++++++++++++++++++++++++++++++++++++++++++++++++++++++++++++++++++++++  Piqray 150 mg - 1 tablet (day20) 8:30  AM   (03/12/2018)  -    FBG   231 Mg%   ->      Nov 70/30  20 u 1:30 PM  (03/12/2018)  -    CBG  184 Mg%    ->     Nov 70/30  25 u 6:00 PM   (03/12/2018)  -   CBG   88Mg %    ->     0  10:00 PM    (03/12/2018)   -   CBG  133Mg %   ->Lantus 110u +    _____ 155Units total for Wed +++++++++++++++++++++++++++++++++++++++++++++++++++++++++++++++++++++++++++++++++++++++++++++++  Piqray 150 mg - 1 tablet (day21) 8:30  AM   (03/13/2018)   -   FBG   179Mg %   ->      Nov 70/30  10 u 1:30 PM  (03/13/2018)  -    CBG  89 Mg%    ->      6:00 PM   (03/13/2018)  -   CBG   144Mg %    ->     10:00 PM    (03/13/2018)   -    CBG  123Mg %   -> Lantus110u +  _____ 120 Units total for  Thurs +++++++++++++++++++++++++++++++++++++++++++++++++++++++++++++++++++++++++++++++++++++++++++++++  Piqray 150 mg - 1 tablet (day22) 8:30  AM   (03/14/2018)   -   FBG   196Mg %   ->    Nov 70/30  15 u 1:30 PM  (03/14/2018)  -    CBG  139Mg %    ->    Nov 70/30  10 u 6:00 PM   (03/14/2018)  -   CBG   211Mg %    ->     Nov 70/30   15 u 10:00 PM    (03/14/2018)   -    CBG  230Mg %   ->Lantus120 u   + Nov 70/30  10 u       _____ 170Units total for Fri +++++++++++++++++++++++++++++++++++++++++++++++++++++++++++++++++++++++++++++++++++++++++++++++  Piqray 150 mg - 1 tablet (day23) 8:30  AM   (03/15/2018)   -   FBG   152 Mg%   ->   Nov 70/30  15 u 1:30 PM  (03/15/2018)  -    CBG     79 Mg%   ->           6:00 PM   (03/15/2018)  -   CBG   180Mg %    ->     Nov 70/30    10 u 10:00 PM    (03/15/2018)   -    CBG  193 Mg%    -> Lantus 120u +     _____ 145 Units total for  Sat +++++++++++++++++++++++++++++++++++++++++++++++++++++++++++++++++++++++++++++++++++++++++++++++  Piqray 150 mg - 1 tablet (day24) 8:30  AM   (03/16/2018)   -   FBG  122   Mg%  ->   Nov 70/30   5 u      1:30 PM  (03/16/2018)  -    CBG    97      Mg%    ->              6:00 PM   (03/16/2018)  -   CBG   189   Mg%    ->  10:00 PM    (03/16/2018)   -    CBG  150 Mg%  -> Lantus110 u +    _____   115 Units total for Sun +++++++++++++++++++++++++++++++++++++++++++++++++++++++++++++++++++++++++++++++++++++++++++++++  Piqray 150 mg - 1 tablet (day25) 8:30  AM   (03/17/2018)   -   FBG    174    Mg%  ->    Nov 70/30  10 u      1:30 PM  (03/17/2018)  -    CBG   168      Mg%    ->    Nov 70/30  15 u 6:00 PM   (03/17/2018)  -   CBG   212      Mg%    ->     Nov 70/30  20 u 10:00 PM    (03/17/2018)   -    CBG  252     Mg%  -> Lantus 120   u +Nov 70/30 20 u  _____ 185Units total for  Mon +++++++++++++++++++++++++++++++++++++++++++++++++++++++++++++++++++++++++++++++++++++++++++++++  Piqray 150 mg - 1 tablet (day26) 8:30  AM   (03/18/2018)   -   FBG            Mg%   ->   Nov 70/30        1:30 PM  (03/18/2018)  -    CBG             Mg%    ->     Nov 70/30          6:00 PM   (03/18/2018)  -   CBG              Mg%    ->      Nov 70/30      10:00 PM    (03/18/2018)   -    CBG    Mg%  -> Lantus      u +   Nov 70/30   _____     Units total for Tues

## 2018-03-17 NOTE — Telephone Encounter (Signed)
Returned patient's call regarding prescription refills.    Refills completed by Dr. Lindi Adie.  Patient aware.  Hard script for dilaudid and fentanyl signed and placed in script book in triage.  Patient aware.

## 2018-03-18 ENCOUNTER — Other Ambulatory Visit: Payer: Self-pay | Admitting: Internal Medicine

## 2018-03-18 NOTE — Progress Notes (Signed)
Complete from 02/08/2018 last on 03/15/2018 +++++++++++++++++++++++++++++++++++++++++++++++++++++++++++++++++++++++++++++++++++++++++++++++  Piqray 150 mg - 1 tablet (day14) 8:30  AM   (03/06/2018)  -   FBG 132  Mg%  ->   Nov 70/30  15 u 1:30 PM  (03/06/2018)  -   CBG 229 Mg%   ->    Nov 70/30  20 u 6:00 PM  (03/06/2018)  -   CBG 187Mg %   ->      10:00 PM    (03/06/2018)   -   CBG 151 Mg%  ->Lantus 110u + Nov 70/30  25 u  _____ 170Units total forThurs +++++++++++++++++++++++++++++++++++++++++++++++++++++++++++++++++++++++++++++++++++++++++++++++  Piqray 150 mg - 1 tablet (day15) 8:30  AM   (03/07/2018)  -   FBG  235Mg %   ->    Nov 70/30  25u 1:30 PM  (03/07/2018)  -   CBG  202Mg %   ->    Nov 70/30  25u 6:00 PM  (03/07/2018)  -   CBG  190Mg %   ->    Nov 70/3025u 10:00 PM    (03/07/2018)   -   CBG  185Mg %  ->Lantus 110u  +    _____ 185Units total forFri +++++++++++++++++++++++++++++++++++++++++++++++++++++++++++++++++++++++++++++++++++++++++++++++  Piqray 150 mg - 1 tablet (day16 8:30  AM   (03/08/2018)  -   FBG  120Mg %   ->    Nov 70/30  15u 1:30 PM  (03/08/2018)  -   CBG   105 Mg%   ->      6:00 PM  (03/08/2018)  -   CBG  103Mg %   ->     10:00 PM    (03/08/2018)   -   CBG  188Mg %  ->Lantus 110u  +   _____  125Units total for Sat +++++++++++++++++++++++++++++++++++++++++++++++++++++++++++++++++++++++++++++++++++++++++++++++  Piqray 150 mg - 1 tablet (day17) 8:30  AM   (03/09/2018)  -   FBG  140 Mg%   ->   Nov 70/30  15 u 1:30 PM  (03/09/2018)  -    CBG   114 Mg%   ->      6:00 PM  (03/09/2018)  -    CBG  219 Mg%   ->     10:00 PM    (03/09/2018)   -    CBG  255 Mg%  ->Lantus120u  +   Nov 70/30 10 u  _____ 145Units  total for Sun +++++++++++++++++++++++++++++++++++++++++++++++++++++++++++++++++++++++++++++++++++++++++++++++  Piqray 150 mg - 1 tablet (day18) 8:30  AM   (03/10/2018)  -   FBG  224Mg %   ->    Nov 70/30 25 u 1:30 PM  (03/10/2018)  -    CBG   87 Mg%   ->       6:00 PM   (03/10/2018)  -   CBG  79 Mg%   ->      10:00 PM    (03/10/2018)   -   CBG  197Mg %  ->Lantus120u  + Nov 70/30 20 u  _____ 165Units total for Mon +++++++++++++++++++++++++++++++++++++++++++++++++++++++++++++++++++++++++++++++++++++++++++++++  Piqray 150 mg - 1 tablet (day19) 8:30  AM   (03/11/2018)  -   FBG   192 Mg%   ->      Nov 70/30  15 u 1:30 PM  (03/11/2018)  -    CBG   82 Mg%   ->     0   6:00 PM   (03/11/2018)  -   CBG  220Mg %   ->     Nov 70/3020 u 10:00 PM    (  03/11/2018)   -   CBG  232Mg %  ->Lantus120u  +  Nov 70/30 10 u   _____ 165Units  total for Tues +++++++++++++++++++++++++++++++++++++++++++++++++++++++++++++++++++++++++++++++++++++++++++++++  Piqray 150 mg - 1 tablet (day20) 8:30  AM   (03/12/2018)  -    FBG   231 Mg%   ->      Nov 70/30  20 u 1:30 PM  (03/12/2018)  -    CBG  184 Mg%    ->     Nov 70/30  25 u 6:00 PM   (03/12/2018)  -   CBG   88Mg %    ->     0  10:00 PM    (03/12/2018)   -   CBG  133Mg %   ->Lantus 110u +    _____ 155Units total for Wed +++++++++++++++++++++++++++++++++++++++++++++++++++++++++++++++++++++++++++++++++++++++++++++++  Piqray 150 mg - 1 tablet (day21) 8:30  AM   (03/13/2018)   -   FBG   179Mg %   ->      Nov 70/30  10 u 1:30 PM  (03/13/2018)  -    CBG  89 Mg%    ->      6:00 PM   (03/13/2018)  -   CBG   144Mg %    ->     10:00 PM    (03/13/2018)   -    CBG  123Mg %   -> Lantus110u +  _____ 120Units total for  Thurs +++++++++++++++++++++++++++++++++++++++++++++++++++++++++++++++++++++++++++++++++++++++++++++++  Piqray 150 mg - 1 tablet (day22) 8:30  AM   (03/14/2018)   -   FBG   196Mg %   ->    Nov 70/30  15 u 1:30 PM  (03/14/2018)  -    CBG  139Mg %    ->    Nov 70/30  10 u 6:00 PM   (03/14/2018)  -   CBG   211Mg %    ->     Nov 70/30 15 u 10:00 PM    (03/14/2018)   -    CBG  230Mg %   ->Lantus120u + Nov 70/30  10 u   _____ 170Units total for Fri +++++++++++++++++++++++++++++++++++++++++++++++++++++++++++++++++++++++++++++++++++++++++++++++  Piqray 150 mg - 1 tablet (day23) 8:30  AM   (03/15/2018)   -   FBG   152Mg %   ->   Nov 70/30  15 u 1:30 PM  (03/15/2018)  -    CBG   79 Mg%   ->        6:00 PM   (03/15/2018)  -   CBG   180Mg %    ->     Nov 70/30 10 u 10:00 PM    (03/15/2018)   -    CBG  193 Mg%    -> Lantus 120u +  _____ 145Units total for  Sat +++++++++++++++++++++++++++++++++++++++++++++++++++++++++++++++++++++++++++++++++++++++++++++++  Piqray 150 mg - 1 tablet (day24) 8:30  AM   (03/16/2018)   -   FBG  122  Mg%  ->   Nov 70/30   5 u 1:30 PM  (03/16/2018)  -    CBG    97  Mg%    ->      6:00 PM   (03/16/2018)  -   CBG  189  Mg%    ->       10:00 PM    (03/16/2018)   -    CBG  150 Mg%  -> Lantus110 u+   _____   115 Units total for Sun +++++++++++++++++++++++++++++++++++++++++++++++++++++++++++++++++++++++++++++++++++++++++++++++  Piqray 150 mg - 1 tablet (day25) 8:30  AM   (03/17/2018)   -   FBG    174   Mg%  ->    Nov 70/30  10 u 1:30 PM  (03/17/2018)  -    CBG   168     Mg%    ->    Nov 70/30 15 u 6:00 PM   (03/17/2018)  -   CBG  212    Mg%    ->     Nov 70/30  20 u 10:00 PM    (03/17/2018)   -    CBG  252      Mg%  -> Lantus 120   u+Nov 70/3020 u  _____ 185Units total for  Mon +++++++++++++++++++++++++++++++++++++++++++++++++++++++++++++++++++++++++++++++++++++++++++++++  Piqray 150 mg - 1 tablet (day26) 8:30  AM   (03/18/2018)   -   FBG    243  Mg%  ->boost Lantus 20 u+ Nov 70/30   30 u 1:30 PM  (03/18/2018)  -    CBG   155 Mg%   ->        Nov 70/30  15 u  6:00 PM   (03/18/2018)  -   CBG    227  Mg%   ->         Nov 70/30 20 u 10:00 PM    (03/18/2018)   -    CBG  208  Mg%  -> Lantus140    u+   Nov 70/3015  u  _____  240Units total for Tues +++++++++++++++++++++++++++++++++++++++++++++++++++++++++++++++++++++++++++++++++++++++++++++++  Piqray 150 mg - 1 tablet (day27) 8:30  AM   (03/19/2018)   -   FBG           Mg%   ->    Nov 70/30    1:30 PM  (03/19/2018)  -    CBG         Mg%    ->     Nov 70/30  6:00 PM   (03/19/2018)  -   CBG           Mg%    ->      Nov 70/30  10:00 PM    (03/19/2018)   -    CBG    Mg%  -> Lantus      u+   Nov 70/30  _____    Units total for   Wed +++++++++++++++++++++++++++++++++++++++++++++++++++++++++++++++++++++++++++++++++++++++++++++++  Piqray 150 mg - 1 tablet (day28) 8:30  AM   (03/20/2018)   -   FBG           Mg%   ->    Nov 70/30    1:30 PM  (03/20/2018)  -    CBG         Mg%    ->     Nov 70/30  6:00 PM   (03/20/2018)  -   CBG           Mg%    ->      Nov 70/30  10:00 PM    (03/20/2018)   -    CBG    Mg%  -> Lantus      u+   Nov 70/30  _____    Units total for  Thurs

## 2018-03-19 ENCOUNTER — Other Ambulatory Visit: Payer: Self-pay | Admitting: Internal Medicine

## 2018-03-19 DIAGNOSIS — J449 Chronic obstructive pulmonary disease, unspecified: Secondary | ICD-10-CM | POA: Diagnosis not present

## 2018-03-19 NOTE — Progress Notes (Addendum)
Complete from 02/08/2018 last on 03/15/2018 +++++++++++++++++++++++++++++++++++++++++++++++++++++++++++++++++++++++++++++++++++++++++++++++  Piqray 150 mg - 1 tablet (day14) 8:30  AM   (03/06/2018)  -   FBG 132  Mg%  ->   Nov 70/30  15 u 1:30 PM  (03/06/2018)  -   CBG 229 Mg%   ->    Nov 70/30  20 u 6:00 PM  (03/06/2018)  -   CBG 187Mg %   ->      10:00 PM    (03/06/2018)   -   CBG 151 Mg%  ->Lantus 110u + Nov 70/30  25 u  _____ 170Units total forThurs +++++++++++++++++++++++++++++++++++++++++++++++++++++++++++++++++++++++++++++++++++++++++++++++  Piqray 150 mg - 1 tablet (day15) 8:30  AM   (03/07/2018)  -   FBG  235Mg %   ->    Nov 70/30  25u 1:30 PM  (03/07/2018)  -   CBG  202Mg %   ->    Nov 70/30  25u 6:00 PM  (03/07/2018)  -   CBG  190Mg %   ->    Nov 70/3025u 10:00 PM    (03/07/2018)   -   CBG  185Mg %  ->Lantus 110u  +    _____ 185Units total forFri +++++++++++++++++++++++++++++++++++++++++++++++++++++++++++++++++++++++++++++++++++++++++++++++  Piqray 150 mg - 1 tablet (day16 8:30  AM   (03/08/2018)  -   FBG  120Mg %   ->    Nov 70/30  15u 1:30 PM  (03/08/2018)  -   CBG   105 Mg%   ->      6:00 PM  (03/08/2018)  -   CBG  103Mg %   ->     10:00 PM    (03/08/2018)   -   CBG  188Mg %  ->Lantus 110u  +   _____  125Units total for Sat +++++++++++++++++++++++++++++++++++++++++++++++++++++++++++++++++++++++++++++++++++++++++++++++  Piqray 150 mg - 1 tablet (day17) 8:30  AM   (03/09/2018)  -   FBG  140  Mg%   ->   Nov 70/30  15 u 1:30 PM  (03/09/2018)  -    CBG   114 Mg%   ->      6:00 PM  (03/09/2018)  -    CBG  219 Mg%   ->     10:00 PM    (03/09/2018)   -    CBG  255 Mg%  ->Lantus120u  +   Nov 70/30 10 u  _____ 145Units  total for Sun +++++++++++++++++++++++++++++++++++++++++++++++++++++++++++++++++++++++++++++++++++++++++++++++  Piqray 150 mg - 1 tablet (day18) 8:30  AM   (03/10/2018)  -   FBG  224Mg %   ->    Nov 70/30 25 u 1:30 PM  (03/10/2018)  -    CBG   87 Mg%   ->       6:00 PM   (03/10/2018)  -   CBG  79 Mg%   ->      10:00 PM    (03/10/2018)   -   CBG  197Mg %  ->Lantus120u  + Nov 70/30 20 u  _____ 165Units total for Mon +++++++++++++++++++++++++++++++++++++++++++++++++++++++++++++++++++++++++++++++++++++++++++++++  Piqray 150 mg - 1 tablet (day19) 8:30  AM   (03/11/2018)  -   FBG   192 Mg%   ->      Nov 70/30  15 u 1:30 PM  (03/11/2018)  -    CBG   82 Mg%   ->     0   6:00 PM   (03/11/2018)  -   CBG  220Mg %   ->     Nov 70/3020 u 10:00 PM    (  03/11/2018)   -   CBG  232Mg %  ->Lantus120u  +  Nov 70/30 10 u   _____ 165Units  total for Tues +++++++++++++++++++++++++++++++++++++++++++++++++++++++++++++++++++++++++++++++++++++++++++++++  Piqray 150 mg - 1 tablet (day20) 8:30  AM   (03/12/2018)  -    FBG   231 Mg%   ->      Nov 70/30  20 u 1:30 PM  (03/12/2018)  -    CBG  184 Mg%    ->     Nov 70/30  25 u 6:00 PM   (03/12/2018)  -   CBG   88Mg %    ->     0  10:00 PM    (03/12/2018)   -   CBG  133Mg %   ->Lantus 110u +    _____ 155Units total for Wed +++++++++++++++++++++++++++++++++++++++++++++++++++++++++++++++++++++++++++++++++++++++++++++++  Piqray 150 mg - 1 tablet (day21) 8:30  AM   (03/13/2018)   -   FBG   179Mg %   ->      Nov 70/30  10 u 1:30 PM  (03/13/2018)  -    CBG  89 Mg%    ->      6:00 PM   (03/13/2018)  -   CBG   144Mg %    ->     10:00 PM    (03/13/2018)   -    CBG  123Mg %   -> Lantus110u +  _____ 120Units total for  Thurs +++++++++++++++++++++++++++++++++++++++++++++++++++++++++++++++++++++++++++++++++++++++++++++++  Piqray 150 mg - 1 tablet (day22) 8:30  AM   (03/14/2018)   -   FBG   196Mg %   ->    Nov 70/30  15 u 1:30 PM  (03/14/2018)  -    CBG  139Mg %    ->    Nov 70/30  10 u 6:00 PM   (03/14/2018)  -   CBG   211Mg %    ->     Nov 70/30 15 u 10:00 PM    (03/14/2018)   -    CBG  230Mg %   ->Lantus120u + Nov 70/30  10 u   _____ 170Units total for Fri +++++++++++++++++++++++++++++++++++++++++++++++++++++++++++++++++++++++++++++++++++++++++++++++  Piqray 150 mg - 1 tablet (day23) 8:30  AM   (03/15/2018)   -   FBG   152Mg %   ->   Nov 70/30  15 u 1:30 PM  (03/15/2018)  -    CBG   79 Mg%   ->        6:00 PM   (03/15/2018)  -   CBG   180Mg %    ->     Nov 70/30 10 u 10:00 PM    (03/15/2018)   -    CBG  193 Mg%   ->Lantus 120u +  _____ 145Units total for  Sat +++++++++++++++++++++++++++++++++++++++++++++++++++++++++++++++++++++++++++++++++++++++++++++++  Piqray 150 mg - 1 tablet (day24) 8:30  AM   (03/16/2018)   -   FBG  122 Mg%  ->   Nov 70/30   5u 1:30 PM  (03/16/2018)  -    CBG  97Mg %    ->     6:00 PM   (03/16/2018)  -   CBG  189  Mg%    ->       10:00 PM    (03/16/2018)   -    CBG  150 Mg%  -> Lantus110 u+   _____  115Units total for Sun +++++++++++++++++++++++++++++++++++++++++++++++++++++++++++++++++++++++++++++++++++++++++++++++  Piqray 150 mg - 1 tablet (day25) 8:30  AM   (03/17/2018)   -   FBG  174 Mg%  ->    Nov 70/30  10u 1:30 PM  (03/17/2018)  -    CBG  168Mg %    ->    Nov 70/30 15 u 6:00 PM   (03/17/2018)  -   CBG  212 Mg%    ->     Nov 70/30 20 u 10:00 PM    (03/17/2018)   -    CBG  252 Mg%  -> Lantus120u+Nov 70/3020 u _____ 185Units total for  Mon +++++++++++++++++++++++++++++++++++++++++++++++++++++++++++++++++++++++++++++++++++++++++++++++  Piqray 150 mg - 1 tablet (day26) 8:30  AM   (03/18/2018)   -   FBG    243 Mg%  ->boost Lantus 20 u+ Nov 70/30   30 u 1:30 PM  (03/18/2018)  -    CBG   155Mg %   ->       Nov 70/30  15 u  6:00 PM   (03/18/2018)  -   CBG  227 Mg%   ->        Nov 70/30 20 u 10:00 PM    (03/18/2018)   -    CBG  208  Mg%  -> Lantus140   u+ Nov 70/3015  u  _____ 240Units total for Tues +++++++++++++++++++++++++++++++++++++++++++++++++++++++++++++++++++++++++++++++++++++++++++++++  Piqray 150 mg - 1 tablet (day27) 8:30  AM   (03/19/2018)   -   FBG  147Mg %    ->      Nov 70/30  10 u  1:30 PM  (03/19/2018)  -    CBG  193Mg %    ->      Nov 70/30 10 u  6:00 PM   (03/19/2018)  -   CBG  161 Mg%     ->      Nov 70/30 15 u 10:00 PM    (03/19/2018)   -    CBG  143 Mg%  -> Lantus  140 u+  _____ 175Units total for   Wed +++++++++++++++++++++++++++++++++++++++++++++++++++++++++++++++++++++++++++++++++++++++++++++++  Piqray 150 mg - 1 tablet (day28) 8:30  AM   (03/20/2018)   -    FBG  155Mg %     ->     Nov 70/30  10 u  1:30 PM  (03/20/2018)  -    CBG  186Mg %    ->       6:00 PM   (03/20/2018)  -   CBG  178  Mg%    ->     Nov 70/30 20 u 10:00 PM    (03/20/2018)   -    CBG  151  Mg%  -> Lantus140 u+   _____ 170Units total for  Thurs

## 2018-03-20 ENCOUNTER — Telehealth: Payer: Self-pay | Admitting: Hematology and Oncology

## 2018-03-20 ENCOUNTER — Inpatient Hospital Stay: Payer: Medicare Other

## 2018-03-20 ENCOUNTER — Inpatient Hospital Stay: Payer: Medicare Other | Attending: Hematology and Oncology

## 2018-03-20 ENCOUNTER — Inpatient Hospital Stay: Payer: Medicare Other | Admitting: Hematology and Oncology

## 2018-03-20 VITALS — BP 123/85 | HR 96 | Temp 98.5°F | Resp 17 | Ht 62.5 in | Wt 171.1 lb

## 2018-03-20 DIAGNOSIS — G893 Neoplasm related pain (acute) (chronic): Secondary | ICD-10-CM

## 2018-03-20 DIAGNOSIS — Z9011 Acquired absence of right breast and nipple: Secondary | ICD-10-CM | POA: Insufficient documentation

## 2018-03-20 DIAGNOSIS — C50511 Malignant neoplasm of lower-outer quadrant of right female breast: Secondary | ICD-10-CM | POA: Diagnosis not present

## 2018-03-20 DIAGNOSIS — Z5111 Encounter for antineoplastic chemotherapy: Secondary | ICD-10-CM | POA: Insufficient documentation

## 2018-03-20 DIAGNOSIS — R05 Cough: Secondary | ICD-10-CM | POA: Insufficient documentation

## 2018-03-20 DIAGNOSIS — R0602 Shortness of breath: Secondary | ICD-10-CM | POA: Insufficient documentation

## 2018-03-20 DIAGNOSIS — G620 Drug-induced polyneuropathy: Secondary | ICD-10-CM

## 2018-03-20 DIAGNOSIS — Z923 Personal history of irradiation: Secondary | ICD-10-CM

## 2018-03-20 DIAGNOSIS — J90 Pleural effusion, not elsewhere classified: Secondary | ICD-10-CM | POA: Insufficient documentation

## 2018-03-20 DIAGNOSIS — J9811 Atelectasis: Secondary | ICD-10-CM | POA: Diagnosis not present

## 2018-03-20 DIAGNOSIS — C7951 Secondary malignant neoplasm of bone: Secondary | ICD-10-CM

## 2018-03-20 DIAGNOSIS — R739 Hyperglycemia, unspecified: Secondary | ICD-10-CM | POA: Diagnosis not present

## 2018-03-20 DIAGNOSIS — Z170 Estrogen receptor positive status [ER+]: Secondary | ICD-10-CM

## 2018-03-20 DIAGNOSIS — D6959 Other secondary thrombocytopenia: Secondary | ICD-10-CM | POA: Diagnosis not present

## 2018-03-20 DIAGNOSIS — C7802 Secondary malignant neoplasm of left lung: Secondary | ICD-10-CM | POA: Diagnosis not present

## 2018-03-20 DIAGNOSIS — Z9221 Personal history of antineoplastic chemotherapy: Secondary | ICD-10-CM

## 2018-03-20 DIAGNOSIS — C787 Secondary malignant neoplasm of liver and intrahepatic bile duct: Secondary | ICD-10-CM

## 2018-03-20 DIAGNOSIS — C7800 Secondary malignant neoplasm of unspecified lung: Secondary | ICD-10-CM

## 2018-03-20 DIAGNOSIS — R062 Wheezing: Secondary | ICD-10-CM | POA: Diagnosis not present

## 2018-03-20 DIAGNOSIS — R7989 Other specified abnormal findings of blood chemistry: Secondary | ICD-10-CM

## 2018-03-20 LAB — CBC WITH DIFFERENTIAL (CANCER CENTER ONLY)
BASOS ABS: 0 10*3/uL (ref 0.0–0.1)
BASOS PCT: 0 %
EOS ABS: 0 10*3/uL (ref 0.0–0.5)
EOS PCT: 1 %
HCT: 38 % (ref 34.8–46.6)
Hemoglobin: 12.8 g/dL (ref 11.6–15.9)
LYMPHS PCT: 15 %
Lymphs Abs: 1.2 10*3/uL (ref 0.9–3.3)
MCH: 37.2 pg — ABNORMAL HIGH (ref 25.1–34.0)
MCHC: 33.7 g/dL (ref 31.5–36.0)
MCV: 110.5 fL — AB (ref 79.5–101.0)
MONO ABS: 1.1 10*3/uL — AB (ref 0.1–0.9)
Monocytes Relative: 14 %
Neutro Abs: 5.6 10*3/uL (ref 1.5–6.5)
Neutrophils Relative %: 70 %
PLATELETS: 140 10*3/uL — AB (ref 145–400)
RBC: 3.44 MIL/uL — AB (ref 3.70–5.45)
RDW: 14.6 % — AB (ref 11.2–14.5)
WBC: 7.9 10*3/uL (ref 3.9–10.3)

## 2018-03-20 LAB — CMP (CANCER CENTER ONLY)
ALBUMIN: 3.6 g/dL (ref 3.5–5.0)
ALT: 28 U/L (ref 0–44)
AST: 53 U/L — AB (ref 15–41)
Alkaline Phosphatase: 104 U/L (ref 38–126)
Anion gap: 13 (ref 5–15)
BUN: 7 mg/dL (ref 6–20)
CHLORIDE: 89 mmol/L — AB (ref 98–111)
CO2: 34 mmol/L — AB (ref 22–32)
Calcium: 8.8 mg/dL — ABNORMAL LOW (ref 8.9–10.3)
Creatinine: 0.84 mg/dL (ref 0.44–1.00)
GFR, Est AFR Am: >60 mL/min (ref >60)
GFR, Estimated: >60 mL/min (ref >60)
GLUCOSE: 253 mg/dL — AB (ref 70–99)
POTASSIUM: 3.4 mmol/L — AB (ref 3.5–5.1)
SODIUM: 136 mmol/L (ref 135–145)
Total Bilirubin: 1.9 mg/dL — ABNORMAL HIGH (ref 0.3–1.2)
Total Protein: 7.7 g/dL (ref 6.5–8.1)

## 2018-03-20 MED ORDER — FULVESTRANT 250 MG/5ML IM SOLN
INTRAMUSCULAR | Status: AC
  Filled 2018-03-20: qty 10

## 2018-03-20 MED ORDER — ALPELISIB (300 MG DAILY DOSE) 2 X 150 MG PO TBPK: 150.0000 mg | ORAL_TABLET | Freq: Every day | ORAL | 1 refills | Status: DC

## 2018-03-20 MED ORDER — DENOSUMAB 120 MG/1.7ML ~~LOC~~ SOLN
SUBCUTANEOUS | Status: AC
  Filled 2018-03-20: qty 1.7

## 2018-03-20 MED ORDER — FENTANYL 25 MCG/HR TD PT72: 25.0000 ug | MEDICATED_PATCH | TRANSDERMAL | 0 refills | Status: DC

## 2018-03-20 MED ORDER — DENOSUMAB 120 MG/1.7ML ~~LOC~~ SOLN
120.0000 mg | Freq: Once | SUBCUTANEOUS | Status: AC
  Administered 2018-03-20: 120 mg via SUBCUTANEOUS

## 2018-03-20 MED ORDER — FULVESTRANT 250 MG/5ML IM SOLN
500.0000 mg | Freq: Once | INTRAMUSCULAR | Status: AC
  Administered 2018-03-20: 500 mg via INTRAMUSCULAR

## 2018-03-20 NOTE — Patient Instructions (Signed)
Fulvestrant injection What is this medicine? FULVESTRANT (ful VES trant) blocks the effects of estrogen. It is used to treat breast cancer. This medicine may be used for other purposes; ask your health care provider or pharmacist if you have questions. What should I tell my health care provider before I take this medicine? They need to know if you have any of these conditions: -bleeding problems -liver disease -low levels of platelets in the blood -an unusual or allergic reaction to fulvestrant, other medicines, foods, dyes, or preservatives -pregnant or trying to get pregnant -breast-feeding How should I use this medicine? This medicine is for injection into a muscle. It is usually given by a health care professional in a hospital or clinic setting. Talk to your pediatrician regarding the use of this medicine in children. Special care may be needed. Overdosage: If you think you have taken too much of this medicine contact a poison control center or emergency room at once. NOTE: This medicine is only for you. Do not share this medicine with others. What if I miss a dose? It is important not to miss your dose. Call your doctor or health care professional if you are unable to keep an appointment. What may interact with this medicine? -medicines that treat or prevent blood clots like warfarin, enoxaparin, and dalteparin This list may not describe all possible interactions. Give your health care provider a list of all the medicines, herbs, non-prescription drugs, or dietary supplements you use. Also tell them if you smoke, drink alcohol, or use illegal drugs. Some items may interact with your medicine. What should I watch for while using this medicine? Your condition will be monitored carefully while you are receiving this medicine. You will need important blood work done while you are taking this medicine. Do not become pregnant while taking this medicine or for at least 1 year after stopping  it. Women of child-bearing potential will need to have a negative pregnancy test before starting this medicine. Women should inform their doctor if they wish to become pregnant or think they might be pregnant. There is a potential for serious side effects to an unborn child. Men should inform their doctors if they wish to father a child. This medicine may lower sperm counts. Talk to your health care professional or pharmacist for more information. Do not breast-feed an infant while taking this medicine or for 1 year after the last dose. What side effects may I notice from receiving this medicine? Side effects that you should report to your doctor or health care professional as soon as possible: -allergic reactions like skin rash, itching or hives, swelling of the face, lips, or tongue -feeling faint or lightheaded, falls -pain, tingling, numbness, or weakness in the legs -signs and symptoms of infection like fever or chills; cough; flu-like symptoms; sore throat -vaginal bleeding Side effects that usually do not require medical attention (report to your doctor or health care professional if they continue or are bothersome): -aches, pains -constipation -diarrhea -headache -hot flashes -nausea, vomiting -pain at site where injected -stomach pain This list may not describe all possible side effects. Call your doctor for medical advice about side effects. You may report side effects to FDA at 1-800-FDA-1088. Where should I keep my medicine? This drug is given in a hospital or clinic and will not be stored at home. NOTE: This sheet is a summary. It may not cover all possible information. If you have questions about this medicine, talk to your doctor, pharmacist, or health   care provider.    2016, Elsevier/Gold Standard. (2015-02-25 11:03:55)  Denosumab injection What is this medicine? DENOSUMAB (den oh sue mab) slows bone breakdown. Prolia is used to treat osteoporosis in women after menopause  and in men. Xgeva is used to prevent bone fractures and other bone problems caused by cancer bone metastases. Xgeva is also used to treat giant cell tumor of the bone. This medicine may be used for other purposes; ask your health care provider or pharmacist if you have questions. What should I tell my health care provider before I take this medicine? They need to know if you have any of these conditions: -dental disease -eczema -infection or history of infections -kidney disease or on dialysis -low blood calcium or vitamin D -malabsorption syndrome -scheduled to have surgery or tooth extraction -taking medicine that contains denosumab -thyroid or parathyroid disease -an unusual reaction to denosumab, other medicines, foods, dyes, or preservatives -pregnant or trying to get pregnant -breast-feeding How should I use this medicine? This medicine is for injection under the skin. It is given by a health care professional in a hospital or clinic setting. If you are getting Prolia, a special MedGuide will be given to you by the pharmacist with each prescription and refill. Be sure to read this information carefully each time. For Prolia, talk to your pediatrician regarding the use of this medicine in children. Special care may be needed. For Xgeva, talk to your pediatrician regarding the use of this medicine in children. While this drug may be prescribed for children as young as 13 years for selected conditions, precautions do apply. Overdosage: If you think you have taken too much of this medicine contact a poison control center or emergency room at once. NOTE: This medicine is only for you. Do not share this medicine with others. What if I miss a dose? It is important not to miss your dose. Call your doctor or health care professional if you are unable to keep an appointment. What may interact with this medicine? Do not take this medicine with any of the following medications: -other medicines  containing denosumab This medicine may also interact with the following medications: -medicines that suppress the immune system -medicines that treat cancer -steroid medicines like prednisone or cortisone This list may not describe all possible interactions. Give your health care provider a list of all the medicines, herbs, non-prescription drugs, or dietary supplements you use. Also tell them if you smoke, drink alcohol, or use illegal drugs. Some items may interact with your medicine. What should I watch for while using this medicine? Visit your doctor or health care professional for regular checks on your progress. Your doctor or health care professional may order blood tests and other tests to see how you are doing. Call your doctor or health care professional if you get a cold or other infection while receiving this medicine. Do not treat yourself. This medicine may decrease your body's ability to fight infection. You should make sure you get enough calcium and vitamin D while you are taking this medicine, unless your doctor tells you not to. Discuss the foods you eat and the vitamins you take with your health care professional. See your dentist regularly. Brush and floss your teeth as directed. Before you have any dental work done, tell your dentist you are receiving this medicine. Do not become pregnant while taking this medicine or for 5 months after stopping it. Women should inform their doctor if they wish to become pregnant or think   they might be pregnant. There is a potential for serious side effects to an unborn child. Talk to your health care professional or pharmacist for more information. What side effects may I notice from receiving this medicine? Side effects that you should report to your doctor or health care professional as soon as possible: -allergic reactions like skin rash, itching or hives, swelling of the face, lips, or tongue -breathing problems -chest pain -fast,  irregular heartbeat -feeling faint or lightheaded, falls -fever, chills, or any other sign of infection -muscle spasms, tightening, or twitches -numbness or tingling -skin blisters or bumps, or is dry, peels, or red -slow healing or unexplained pain in the mouth or jaw -unusual bleeding or bruising Side effects that usually do not require medical attention (Report these to your doctor or health care professional if they continue or are bothersome.): -muscle pain -stomach upset, gas This list may not describe all possible side effects. Call your doctor for medical advice about side effects. You may report side effects to FDA at 1-800-FDA-1088. Where should I keep my medicine? This medicine is only given in a clinic, doctor's office, or other health care setting and will not be stored at home. NOTE: This sheet is a summary. It may not cover all possible information. If you have questions about this medicine, talk to your doctor, pharmacist, or health care provider.    2016, Elsevier/Gold Standard. (2012-01-28 12:37:47)   

## 2018-03-20 NOTE — Telephone Encounter (Signed)
Gave patient avs and calendar of upcoming appts.  °
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
## 2018-03-21 ENCOUNTER — Other Ambulatory Visit: Payer: Self-pay | Admitting: Internal Medicine

## 2018-03-21 NOTE — Progress Notes (Signed)
+++++++++++++++++++++++++++++++++++++++++++++++++++++++++++++++++++++++++++++++++++++++++++++++  Piqray 150 mg - 1 tablet (day22) 8:30  AM   (03/14/2018)   -   FBG   196Mg %   ->    Nov 70/30  15 u 1:30 PM  (03/14/2018)  -    CBG  139Mg %    ->    Nov 70/30  10 u 6:00 PM   (03/14/2018)  -   CBG   211Mg %    ->     Nov 70/30 15 u 10:00 PM    (03/14/2018)   -    CBG  230Mg %   ->Lantus120u + Nov 70/30  10 u   _____ 170Units total for Fri +++++++++++++++++++++++++++++++++++++++++++++++++++++++++++++++++++++++++++++++++++++++++++++++  Piqray 150 mg - 1 tablet (day23) 8:30  AM   (03/15/2018)   -   FBG   152Mg %   ->   Nov 70/30  15 u 1:30 PM  (03/15/2018)  -    CBG   79 Mg%   ->        6:00 PM   (03/15/2018)  -   CBG   180Mg %    ->     Nov 70/30 10 u 10:00 PM    (03/15/2018)   -    CBG  193 Mg%   ->Lantus 120u +  _____ 145Units total for  Sat +++++++++++++++++++++++++++++++++++++++++++++++++++++++++++++++++++++++++++++++++++++++++++++++  Piqray 150 mg - 1 tablet (day24) 8:30  AM   (03/16/2018)   -   FBG  122 Mg%  ->   Nov 70/30   5u 1:30 PM  (03/16/2018)  -    CBG  97Mg %    ->     6:00 PM   (03/16/2018)  -   CBG  189  Mg%    ->       10:00 PM    (03/16/2018)   -    CBG  150 Mg%  -> Lantus110 u+   _____  115Units total for Sun +++++++++++++++++++++++++++++++++++++++++++++++++++++++++++++++++++++++++++++++++++++++++++++++  Piqray 150 mg - 1 tablet (day25) 8:30  AM   (03/17/2018)   -   FBG  174 Mg%  ->    Nov 70/30  10u 1:30 PM  (03/17/2018)  -    CBG  168Mg %    ->    Nov 70/30 15 u 6:00 PM   (03/17/2018)  -   CBG  212 Mg%    ->     Nov 70/30 20 u 10:00 PM    (03/17/2018)   -    CBG  252Mg %  -> Lantus120u+Nov 70/3020 u _____ 185Units total for  Mon +++++++++++++++++++++++++++++++++++++++++++++++++++++++++++++++++++++++++++++++++++++++++++++++  Piqray 150 mg - 1 tablet (day26) 8:30  AM   (03/18/2018)   -   FBG   243 Mg%  ->boost Lantus 20 u+ Nov 70/30 30 u 1:30 PM  (03/18/2018)  -    CBG   155Mg %   ->     Nov 70/30 15 u  6:00 PM   (03/18/2018)  -   CBG  227 Mg%   ->     Nov 70/30 20 u 10:00 PM    (03/18/2018)   -    CBG  208Mg %  -> Lantus140u+ Nov 70/3015 u  _____ 240Units total for Tues +++++++++++++++++++++++++++++++++++++++++++++++++++++++++++++++++++++++++++++++++++++++++++++++  Piqray 150 mg - 1 tablet (day27) 8:30  AM   (03/19/2018)   -   FBG  147Mg %    ->     Nov 70/30  10 u  1:30 PM  (03/19/2018)  -    CBG  193Mg %    ->  Nov 70/30 10 u  6:00 PM   (03/19/2018)  -   CBG  161 Mg%     ->      Nov 70/30 15 u 10:00 PM    (03/19/2018)   -    CBG  143 Mg%  -> Lantus  140 u+  _____ 175Units total for  Wed +++++++++++++++++++++++++++++++++++++++++++++++++++++++++++++++++++++++++++++++++++++++++++++++  Piqray 150 mg - 1 tablet (day28) 8:30  AM   (03/20/2018)   -    FBG  155Mg %     ->    Nov 70/30  10 u  1:30 PM  (03/20/2018)  -    CBG  186Mg %    ->       6:00 PM   (03/20/2018)  -   CBG  178  Mg%    ->     Nov 70/30 20 u 10:00 PM    (03/20/2018)   -    CBG  151  Mg%  -> Lantus140 u+   _____ 170Units total for Thurs +++++++++++++++++++++++++++++++++++++++++++++++++++++++++++++++++++++++++++++++++++++++++++++++  Piqray 150 mg - 1 tablet (day29) 8:30  AM   (03/21/2018)   -     FBG  133    Mg%    ->      Nov 70/30  10 u  1:30 PM  (03/21/2018)  -      CBG    59  Mg%    ->        6:00 PM   (03/21/2018)  -     CBG  132     Mg%    ->       10:00 PM    (03/21/2018)   -      CBG  146    Mg%  -> Lantus 140  u+    _____    150   Units total for Fri

## 2018-03-22 ENCOUNTER — Other Ambulatory Visit: Payer: Self-pay | Admitting: Internal Medicine

## 2018-03-22 NOTE — Progress Notes (Signed)
++++++++++++++++++++++++++++++++++++++++++++++++++++++++++++++++++++++++++++++++++++++++++++++  Piqray 150 mg - 1 tablet (day22) 8:30  AM   (03/14/2018)   -   FBG   196Mg %   ->    Nov 70/30  15 u 1:30 PM  (03/14/2018)  -    CBG  139Mg %    ->    Nov 70/30  10 u 6:00 PM   (03/14/2018)  -   CBG   211Mg %    ->     Nov 70/30 15 u 10:00 PM    (03/14/2018)   -    CBG  230Mg %   ->Lantus120u + Nov 70/30  10 u   _____ 170Units total for Fri +++++++++++++++++++++++++++++++++++++++++++++++++++++++++++++++++++++++++++++++++++++++++++++++  Piqray 150 mg - 1 tablet (day23) 8:30  AM   (03/15/2018)   -   FBG   152Mg %   ->   Nov 70/30  15 u 1:30 PM  (03/15/2018)  -    CBG   79 Mg%   ->        6:00 PM   (03/15/2018)  -   CBG   180Mg %    ->     Nov 70/30 10 u 10:00 PM    (03/15/2018)   -    CBG  193 Mg%   ->Lantus 120u +  _____ 145Units total for  Sat +++++++++++++++++++++++++++++++++++++++++++++++++++++++++++++++++++++++++++++++++++++++++++++++  Piqray 150 mg - 1 tablet (day24) 8:30  AM   (03/16/2018)   -   FBG  122 Mg%  ->   Nov 70/30   5u 1:30 PM  (03/16/2018)  -    CBG  97Mg %    ->     6:00 PM   (03/16/2018)  -   CBG  189  Mg%    ->       10:00 PM    (03/16/2018)   -    CBG  150 Mg%  -> Lantus110 u+   _____  115Units total for Sun +++++++++++++++++++++++++++++++++++++++++++++++++++++++++++++++++++++++++++++++++++++++++++++++  Piqray 150 mg - 1 tablet (day25) 8:30  AM   (03/17/2018)   -   FBG  174 Mg%  ->    Nov 70/30  10u 1:30 PM  (03/17/2018)  -    CBG  168Mg %    ->    Nov 70/30 15 u 6:00 PM   (03/17/2018)  -   CBG  212 Mg%    ->     Nov 70/30 20 u 10:00 PM    (03/17/2018)   -    CBG  252Mg %  -> Lantus120u+Nov 70/3020 u _____ 185Units total for  Mon +++++++++++++++++++++++++++++++++++++++++++++++++++++++++++++++++++++++++++++++++++++++++++++++  Piqray 150 mg - 1 tablet (day26) 8:30  AM   (03/18/2018)   -   FBG   243 Mg%  ->boost Lantus 20 u+ Nov 70/30 30 u 1:30 PM  (03/18/2018)  -    CBG   155Mg %   ->     Nov 70/30 15 u  6:00 PM   (03/18/2018)  -   CBG  227 Mg%   ->     Nov 70/30 20 u 10:00 PM    (03/18/2018)   -    CBG  208Mg %  -> Lantus140u+ Nov 70/3015 u  _____ 240Units total for Tues +++++++++++++++++++++++++++++++++++++++++++++++++++++++++++++++++++++++++++++++++++++++++++++++  Piqray 150 mg - 1 tablet (day27) 8:30  AM   (03/19/2018)   -   FBG  147Mg %   ->    Nov 70/30  10 u  1:30 PM  (03/19/2018)  -    CBG  193Mg %    ->  Nov 70/30 10 u  6:00 PM   (03/19/2018)  -   CBG  161 Mg%     ->     Nov 70/30 15 u 10:00 PM    (03/19/2018)   -    CBG  143 Mg%  -> Lantus 140u+  _____ 175Units total for  Wed +++++++++++++++++++++++++++++++++++++++++++++++++++++++++++++++++++++++++++++++++++++++++++++++  Piqray 150 mg - 1 tablet (day28) 8:30  AM   (03/20/2018)   -    FBG  155Mg %    ->    Nov 70/30  10 u  1:30 PM  (03/20/2018)  -    CBG  186Mg %    ->     6:00 PM   (03/20/2018)  -   CBG  178 Mg%    ->     Nov 70/30 20 u 10:00 PM    (03/20/2018)   -    CBG  151  Mg%  -> Lantus140u+   _____ 170Units total for Thurs +++++++++++++++++++++++++++++++++++++++++++++++++++++++++++++++++++++++++++++++++++++++++++++++  Piqray 150 mg - 1 tablet (day29) 8:30  AM   (03/21/2018)   -     FBG  133    Mg%   ->      Nov 70/30  10 u  1:30 PM  (03/21/2018)  -      CBG    59  Mg%    ->      6:00 PM   (03/21/2018)  -     CBG  132    Mg%    ->       10:00 PM    (03/21/2018)   -      CBG  146    Mg%  -> Lantus 140  u+    _____    150   Units total for  Fri +++++++++++++++++++++++++++++++++++++++++++++++++++++++++++++++++++++++++++++++++++++++++++++++  Piqray 150 mg - 1 tablet (day30)  8:30  AM   (03/22/2018)   -     FBG  216    Mg%   ->       Nov 70/30  15 u  1:30 PM  (03/22/2018)  -      CBG    81  Mg%    ->       6:00 PM   (03/22/2018)  -     CBG  193    Mg%    ->      Nov 70/30  15 u 10:00 PM    (03/22/2018)   -      CBG  153   Mg%  -> Lantus 140  u+        _____   170   Units total for Sat +++++++++++++++++++++++++++++++++++++++++++++++++++++++++++++++++++++++++++++++++++++++++++++++  Piqray 150 mg - 1 tablet (day31) 8:30  AM   (03/23/2018)   -     FBG          Mg%    ->       Nov 70/30    1:30 PM  (03/23/2018)  -      CBG         Mg%    ->      Nov 70/30  6:00 PM   (03/23/2018)  -     CBG         Mg%    ->        Nov 70/30  10:00 PM    (03/23/2018)   -      CBG         Mg%  -> Lantus  u+    Nov 70/30    _____       Units total  for Sun +++++++++++++++++++++++++++++++++++++++++++++++++++++++++++++++++++++++++++++++++++++++++++++++  Piqray 150 mg - 1 tablet (day32) 8:30  AM   (812/2019)   -      FBG         Mg%   ->      Nov 70/30    1:30 PM  (03/24/2018)  -      CBG        Mg%    ->    Nov 70/30  6:00 PM   (03/24/2018)  -     CBG         Mg%    ->      Nov 70/30  10:00 PM    (03/24/2018)   -      CBG         Mg%  ->  Lantus        u+ Nov 70/30    _____      Units total for Mon

## 2018-03-23 ENCOUNTER — Other Ambulatory Visit: Payer: Self-pay | Admitting: Internal Medicine

## 2018-03-23 NOTE — Progress Notes (Signed)
Piqray 150 mg - 1 tablet (day22) 8:30  AM   (03/14/2018)   -   FBG   196Mg %   ->    Nov 70/30  15 u 1:30 PM  (03/14/2018)  -    CBG  139Mg %    ->    Nov 70/30  10 u 6:00 PM   (03/14/2018)  -   CBG   211Mg %    ->     Nov 70/30 15 u 10:00 PM    (03/14/2018)   -    CBG  230Mg %   ->Lantus120u + Nov 70/30  10 u   _____ 170Units total for Fri +++++++++++++++++++++++++++++++++++++++++++++++++++++++++++++++++++++++++++++++++++++++++++++++  Piqray 150 mg - 1 tablet (day23) 8:30  AM   (03/15/2018)   -   FBG   152Mg %   ->   Nov 70/30  15 u 1:30 PM  (03/15/2018)  -    CBG   79 Mg%   ->        6:00 PM   (03/15/2018)  -   CBG   180Mg %    ->     Nov 70/30 10 u 10:00 PM    (03/15/2018)   -    CBG  193 Mg%   ->Lantus 120u +  _____ 145Units total for  Sat +++++++++++++++++++++++++++++++++++++++++++++++++++++++++++++++++++++++++++++++++++++++++++++++  Piqray 150 mg - 1 tablet (day24) 8:30  AM   (03/16/2018)   -   FBG  122 Mg%  ->   Nov 70/30   5u 1:30 PM  (03/16/2018)  -    CBG  97Mg %    ->     6:00 PM   (03/16/2018)  -   CBG  189  Mg%    ->       10:00 PM    (03/16/2018)   -    CBG  150 Mg%  -> Lantus110 u+   _____  115Units total for Sun +++++++++++++++++++++++++++++++++++++++++++++++++++++++++++++++++++++++++++++++++++++++++++++++  Piqray 150 mg - 1 tablet (day25) 8:30  AM   (03/17/2018)   -   FBG  174 Mg%  ->    Nov 70/30  10u 1:30 PM  (03/17/2018)  -    CBG  168Mg %    ->    Nov 70/30 15 u 6:00 PM   (03/17/2018)  -   CBG  212 Mg%    ->     Nov 70/30 20 u 10:00 PM    (03/17/2018)   -    CBG  252Mg %  -> Lantus120u+Nov 70/3020 u _____ 185Units total for  Mon +++++++++++++++++++++++++++++++++++++++++++++++++++++++++++++++++++++++++++++++++++++++++++++++  Piqray 150 mg - 1 tablet (day26) 8:30  AM   (03/18/2018)   -   FBG   243 Mg%  ->boost Lantus 20 u+ Nov 70/30 30 u 1:30 PM  (03/18/2018)  -    CBG   155Mg %   ->     Nov 70/30 15 u  6:00 PM   (03/18/2018)  -   CBG  227 Mg%   ->     Nov 70/30 20 u 10:00 PM    (03/18/2018)   -    CBG  208Mg %  -> Lantus140u+ Nov 70/3015 u  _____ 240Units total for Tues +++++++++++++++++++++++++++++++++++++++++++++++++++++++++++++++++++++++++++++++++++++++++++++++  Piqray 150 mg - 1 tablet (day27) 8:30  AM   (03/19/2018)   -   FBG  147Mg %   ->    Nov 70/30  10 u  1:30 PM  (03/19/2018)  -    CBG  193Mg %    ->  Nov 70/30 10 u  6:00 PM   (03/19/2018)  -   CBG  161 Mg%     ->     Nov 70/30 15 u 10:00 PM    (03/19/2018)   -    CBG  143 Mg%  -> Lantus 140u+  _____ 175Units total for  Wed +++++++++++++++++++++++++++++++++++++++++++++++++++++++++++++++++++++++++++++++++++++++++++++++  Piqray 150 mg - 1 tablet (day28) 8:30  AM   (03/20/2018)   -    FBG  155Mg %    ->    Nov 70/30  10 u  1:30 PM  (03/20/2018)  -    CBG  186Mg %    ->     6:00 PM   (03/20/2018)  -   CBG  178 Mg%    ->     Nov 70/30 20 u 10:00 PM    (03/20/2018)   -    CBG  151  Mg%  -> Lantus140u+   _____ 170Units total for Thurs +++++++++++++++++++++++++++++++++++++++++++++++++++++++++++++++++++++++++++++++++++++++++++++++  Piqray 150 mg - 1 tablet (day29) 8:30  AM   (03/21/2018)   -    FBG  133 Mg%   ->      Nov 70/30  10 u  1:30 PM  (03/21/2018)  -    CBG  59 Mg%    ->     6:00 PM   (03/21/2018)  -    CBG  132 Mg%    ->       10:00 PM    (03/21/2018)   -     CBG  146 Mg%  -> Lantus140u+   _____ 150Units total for  Fri +++++++++++++++++++++++++++++++++++++++++++++++++++++++++++++++++++++++++++++++++++++++++++++++  Piqray 150 mg - 1 tablet (day30)  8:30  AM   (03/22/2018)   -    FBG  216 Mg%   ->       Nov 70/30  15 u  1:30 PM  (03/22/2018)  -    CBG  81 Mg%    ->      6:00 PM   (03/22/2018)  -    CBG  193 Mg%    ->      Nov 70/30  15 u 10:00 PM    (03/22/2018)   -     CBG  153 Mg%  -> Lantus 140 u+       _____ 170Units total for Sat +++++++++++++++++++++++++++++++++++++++++++++++++++++++++++++++++++++++++++++++++++++++++++++++  Piqray 150 mg - 1 tablet (day31) 8:30  AM   (03/23/2018)   -    FBG    105 Mg%    ->           1:30 PM  (03/23/2018)  -    CBG   113 Mg%      ->       6:00 PM   (03/23/2018)  -    CBG   125 Mg%      ->          10:00 PM    (03/23/2018)   -     CBG  181 Mg%    ->     Lantus140 u+ Nov 70/3010 u   _____ 150Units total for  Sun +++++++++++++++++++++++++++++++++++++++++++++++++++++++++++++++++++++++++++++++++++++++++++++++  Piqray 150 mg - 1 tablet (day32) 8:30  AM   (812/2019)   -     FBG      Mg%   ->        Nov 70/30    1:30 PM  (03/24/2018)  -    CBG     Mg%    ->  Nov 70/30  6:00 PM   (03/24/2018)  -    CBG         Mg%    ->        Nov 70/30  10:00 PM    (03/24/2018)   -     CBG       Mg%  ->  Lantus     u+   Nov 70/30   _____ Units total for Mon

## 2018-03-24 ENCOUNTER — Other Ambulatory Visit: Payer: Self-pay | Admitting: Internal Medicine

## 2018-03-24 NOTE — Progress Notes (Signed)
Piqray 150 mg - 1 tablet (day22) 8:30  AM   (03/14/2018)   -   FBG   196Mg %   ->    Nov 70/30  15 u 1:30 PM  (03/14/2018)  -    CBG  139Mg %    ->    Nov 70/30  10 u 6:00 PM   (03/14/2018)  -   CBG   211Mg %    ->     Nov 70/30 15 u 10:00 PM    (03/14/2018)   -    CBG  230Mg %   ->Lantus120u + Nov 70/30  10 u   _____ 170Units total for Fri +++++++++++++++++++++++++++++++++++++++++++++++++++++++++++++++++++++++++++++++++++++++++++++++  Piqray 150 mg - 1 tablet (day23) 8:30  AM   (03/15/2018)   -   FBG   152Mg %   ->   Nov 70/30  15 u 1:30 PM  (03/15/2018)  -    CBG   79 Mg%   ->        6:00 PM   (03/15/2018)  -   CBG   180Mg %    ->     Nov 70/30 10 u 10:00 PM    (03/15/2018)   -    CBG  193 Mg%   ->Lantus 120u +  _____ 145Units total for  Sat +++++++++++++++++++++++++++++++++++++++++++++++++++++++++++++++++++++++++++++++++++++++++++++++  Piqray 150 mg - 1 tablet (day24) 8:30  AM   (03/16/2018)   -   FBG  122 Mg%  ->   Nov 70/30   5u 1:30 PM  (03/16/2018)  -    CBG  97Mg %    ->     6:00 PM   (03/16/2018)  -   CBG  189  Mg%    ->       10:00 PM    (03/16/2018)   -    CBG  150 Mg%  -> Lantus110 u+   _____  115Units total for Sun +++++++++++++++++++++++++++++++++++++++++++++++++++++++++++++++++++++++++++++++++++++++++++++++  Piqray 150 mg - 1 tablet (day25) 8:30  AM   (03/17/2018)   -   FBG  174 Mg%  ->    Nov 70/30  10u 1:30 PM  (03/17/2018)  -    CBG  168Mg %    ->    Nov 70/30 15 u 6:00 PM   (03/17/2018)  -   CBG  212 Mg%    ->     Nov 70/30 20 u 10:00 PM    (03/17/2018)   -    CBG  252Mg %  -> Lantus120u+Nov 70/3020 u _____ 185Units total for  Mon +++++++++++++++++++++++++++++++++++++++++++++++++++++++++++++++++++++++++++++++++++++++++++++++  Piqray 150 mg - 1 tablet (day26) 8:30  AM   (03/18/2018)   -   FBG   243 Mg%  ->boost Lantus 20 u+ Nov 70/30 30 u 1:30 PM  (03/18/2018)  -    CBG   155Mg %   ->     Nov 70/30 15 u  6:00 PM   (03/18/2018)  -   CBG  227 Mg%   ->     Nov 70/30 20 u 10:00 PM    (03/18/2018)   -    CBG  208Mg %  -> Lantus140u+ Nov 70/3015 u  _____ 240Units total for Tues +++++++++++++++++++++++++++++++++++++++++++++++++++++++++++++++++++++++++++++++++++++++++++++++  Piqray 150 mg - 1 tablet (day27) 8:30  AM   (03/19/2018)   -   FBG  147Mg %   ->    Nov 70/30  10 u  1:30 PM  (03/19/2018)  -    CBG  193Mg %    ->  Nov 70/30 10 u  6:00 PM   (03/19/2018)  -   CBG  161 Mg%     ->     Nov 70/30 15 u 10:00 PM    (03/19/2018)   -    CBG  143 Mg%  -> Lantus 140u+  _____ 175Units total for  Wed +++++++++++++++++++++++++++++++++++++++++++++++++++++++++++++++++++++++++++++++++++++++++++++++  Piqray 150 mg - 1 tablet (day28) 8:30  AM   (03/20/2018)   -    FBG  155Mg %    ->    Nov 70/30  10 u  1:30 PM  (03/20/2018)  -    CBG  186Mg %    ->     6:00 PM   (03/20/2018)  -   CBG  178 Mg%    ->     Nov 70/30 20 u 10:00 PM    (03/20/2018)   -    CBG  151  Mg%  -> Lantus140u+   _____ 170Units total for Thurs +++++++++++++++++++++++++++++++++++++++++++++++++++++++++++++++++++++++++++++++++++++++++++++++  Piqray 150 mg - 1 tablet (day29) 8:30  AM   (03/21/2018)   -    FBG  133 Mg%   ->      Nov 70/30  10 u  1:30 PM  (03/21/2018)  -    CBG  59 Mg%    ->     6:00 PM   (03/21/2018)  -    CBG  132 Mg%    ->       10:00 PM    (03/21/2018)   -     CBG  146 Mg%  -> Lantus140u+   _____ 150Units total for  Fri +++++++++++++++++++++++++++++++++++++++++++++++++++++++++++++++++++++++++++++++++++++++++++++++  Piqray 150 mg - 1 tablet (day30)  8:30  AM   (03/22/2018)   -    FBG  216 Mg%   ->       Nov 70/30  15u  1:30 PM  (03/22/2018)  -    CBG  81 Mg%    ->     6:00 PM   (03/22/2018)  -    CBG  193 Mg%    ->     Nov 70/30  15 u 10:00 PM    (03/22/2018)   -     CBG  153 Mg%  -> Lantus140u+    _____ 170Units total for Sat +++++++++++++++++++++++++++++++++++++++++++++++++++++++++++++++++++++++++++++++++++++++++++++++  Piqray 150 mg - 1 tablet (day31) 8:30  AM   (03/23/2018)   -    FBG    105Mg %    ->           1:30 PM  (03/23/2018)  -    CBG   113 Mg%      ->      6:00 PM   (03/23/2018)  -    CBG  125  Mg%      ->      10:00 PM    (03/23/2018)   -     CBG  181  Mg%    ->     Lantus140u+ Nov 70/3010 u   _____ 150Units total  forSun +++++++++++++++++++++++++++++++++++++++++++++++++++++++++++++++++++++++++++++++++++++++++++++++  Piqray 150 mg - 1 tablet (day32) 8:30  AM   (812/2019)   -     FBG 145 Mg%   ->        Nov 70/30 10 u   1:30 PM  (03/24/2018)  -    CBG 243 Mg%    ->     Nov 70/30 20 u  6:00 PM   (03/24/2018)  -    CBG  163 Mg%    ->       Nov 70/3015 u 10:00 PM    (03/24/2018)   -     CBG  207Mg %  -> Lantus 140u+      _____ 185 Units total for Mon +++++++++++++++++++++++++++++++++++++++++++++++++++++++++++++++++++++++++++++++++++++++++++++++  Piqray 150 mg - 1 tablet (day33) 8:30  AM   (813/2019)   -    FBG     Mg%   ->       Nov 70/30    1:30 PM  (03/25/2018)  -    CBG   Mg%    ->     Nov 70/30  6:00 PM   (03/25/2018)  -    CBG   Mg%    ->       Nov 70/30 10:00 PM    (03/25/2018)   -     CBG   Mg%  -> Lantusu+   Nov 70/30   _____  Units total for Tues +++++++++++++++++++++++++++++++++++++++++++++++++++++++++++++++++++++++++++++++++++++++++++++++  Piqray 150 mg - 1 tablet (day34) 8:30  AM   (814/2019)    -    FBG    Mg%   ->        Nov 70/30    1:30 PM  (03/26/2018)  -     CBG   Mg%    ->     Nov 70/30  6:00 PM   (03/26/2018)  -     CBG   Mg%    ->       Nov 70/30 10:00 PM    (03/26/2018)   -      CBG   Mg%  -> Lantusu+  Nov 70/30   _____ Units total for Wed +++++++++++++++++++++++++++++++++++++++++++++++++++++++++++++++++++++++++++++++++++++++++++++++

## 2018-03-26 ENCOUNTER — Telehealth: Payer: Self-pay | Admitting: Hematology and Oncology

## 2018-03-26 ENCOUNTER — Ambulatory Visit (INDEPENDENT_AMBULATORY_CARE_PROVIDER_SITE_OTHER): Payer: Medicare Other | Admitting: Internal Medicine

## 2018-03-26 VITALS — BP 118/68 | HR 88 | Temp 97.8°F | Resp 16 | Ht 62.5 in | Wt 172.4 lb

## 2018-03-26 DIAGNOSIS — Z79899 Other long term (current) drug therapy: Secondary | ICD-10-CM | POA: Diagnosis not present

## 2018-03-26 DIAGNOSIS — T50905A Adverse effect of unspecified drugs, medicaments and biological substances, initial encounter: Secondary | ICD-10-CM

## 2018-03-26 DIAGNOSIS — R739 Hyperglycemia, unspecified: Secondary | ICD-10-CM | POA: Diagnosis not present

## 2018-03-26 DIAGNOSIS — E109 Type 1 diabetes mellitus without complications: Secondary | ICD-10-CM | POA: Diagnosis not present

## 2018-03-26 DIAGNOSIS — C50919 Malignant neoplasm of unspecified site of unspecified female breast: Secondary | ICD-10-CM | POA: Diagnosis not present

## 2018-03-26 NOTE — Progress Notes (Signed)
[facility]  Unk Pinto, M.D.  Uvaldo Bristle. Silverio Lay, P.A.-C Liane Comber, [place] [address] [place] [address] Telephone [phone] Telefax [phone]  Subjective:    Patient ID: Suzanne [name], female    DOB: Jan 02, 1956, 62 y.o.   MRN: [id number]  HPI      This nice 62 yo MWF with hx/o metastatic Breast Ca returns for 3 week f/u managing severe Insulin Resistance / drug induced Hyperglycemia consequent of Piqray Chemotx initiated 01/29/2018 thru 02/15/2018 then withheld due to sever hyperglycemia requiring 500 units insulin/day. Then Piqray restarted 02/21/2018 at 1/2 dose (150 mg) and now on day 34 at reduced dose with daily insulin requirement ranging 130 - 185 units over the last week.  She averages about Grade 1 Hyperglycemia toxicity.  Early on patient was intolerant to immediate release Metformin & has been reluctant to try extended release Metformin. Patient is still plagued with anorexia & frequent nausea. Patient is on chronic opioid therapy for her metastatic disease.       Patient is also followed by Dr Lamonte Sakai for  O2 dependent obstructive/restrictive Lung Dz. Also followed by Dr Fletcher Anon for non ischemic cardiomyopathy.  Medication Sig  . albuterol HFA 108 inhaler INHALE 1 PUFF EVERY 6 HRS AS NEEDED   . albuterol (2.5 MG/3ML)  neb soln INHALE 1 VIAL  EVERY 6 HRS AS NEEDED  . Alpelisib 300 MG / PIQRAY 150 MG  Take 150 mg by mouth daily.  . OS-CAL 500  MG  Take 1 tablet daily  . VITAMIN D 1000 units tablet Take 1,000 Units  daily.  . fentaNYL 25 MCG/HR patch Place 1 patch onto the skin every 3 days.  Marland Kitchen FLONASE  nasal spray Place 2 sprays into nostrils daily.  . TRELEGY ELLIPTA 100-62.5-25  Inhale 1 puff  daily.  . furosemide  40 MG tablet Take 1 tablet  daily.  Marland Kitchen HYDROmorphone  2 MG tablet Take 1 tablet every 4  hours as needed for severe pain.  Marland Kitchen ibuprofen  200 MG  Take  every 6  hours as  needed.  Marland Kitchen LANTUS 100 UNIT/ML injec Inject up to 150 units once daily as directed for Insulin Resistance  . NOVOLIN 70/30  injec Take 75 units 2 x/day or as directed  . letrozole (FEMARA) 2.5 MG TAKE 1 TABLET BY MOUTH ONCE DAILY  . loratadine  10 MG t Take 1 tablet (10 mg total) by mouth daily.  Marland Kitchen LORazepam2 MG tablet TAKE 1/2 TO 1 TABLET BY MOUTH AT BEDTIME  . metFORMIN-XR 500 MG  Take 2 tablets 2 x/ day for Diabetes  . ondansetron  8 MG tablet Take 1 tablet 3 x /day for Nausea  . K-DUR 20 MEQ Take 1 tablet daily  . prochlorperazine 10 MG tablet Take 1 tablet  every 6 hours as needed for nausea  . promethazine  25 MG tablet Take 1 tablet  every 6  hours as needed for nausea.  Marland Kitchen tiZANidine (ZANAFLEX) 4 MG tablet Take 1 tablet  2  times daily as needed for muscle spasms.   Allergies  Allergen Reactions  . Ace Inhibitors     cough   Past Medical History:  Diagnosis Date  . Alcoholism ([place])   . Cancer [facility])    breast ca - 1994; recurred in 2007. s/p masectomy with flap rconstruction aand chemo '94. local recurrence on chest wall. chemo and XRT '07. now femara  .  Depression   . Dyspnea    myoview 2011: EF 55% question of  mild reverible anterior defect. thought to be breast attenuation. echo 45-50% with global HK. Grade 1 diastolyic dysfunction. RV nml. cardiac MRI with EF 44% with septal HK. no scar   . History of coronary artery stent placement   . Hyperlipidemia   . Hypertension   . Insomnia   . Lung disorder    Past Surgical History:  Procedure Laterality Date  . CARDIAC CATHETERIZATION     [facility]  . MASTECTOMY    . ORIF DISTAL RADIUS FRACTURE    . PORT-A-CATH REMOVAL  07/29/2012   Procedure: REMOVAL PORT-A-CATH;  Surgeon: Haywood Lasso, MD;  Location: [place];  Service: General;  Laterality: Left;  . R masectomy  '94   . R transflap  '94   Review of Systems   10 point systems review negative except as above.    Objective:   Physical  Exam  BP 118/68   Pulse 88   Temp 97.8 F (36.6 C)   Resp 16   Ht 5' 2.5" (1.588 m)   Wt 172 lb 6.4 oz (78.2 kg)   BMI 31.03 kg/m   In No distress on nasal O2.  HEENT - WNL. Neck - supple.  Chest -  BS decreased & clear w/o wheezes or rales. Cor - Nl HS. RRR w/o sig m. PP 1(+). No edema. MS- FROM w/o deformities.  Gait Nl. Skin - exposed w/o  rash, cyanosis or icterus. Neuro -  Nl w/o focal abnormalities.    Assessment & Plan:   1. Type 1 diabetes mellitus without complication (HCC)  - COMPLETE METABOLIC PANEL WITH GFR - Fructosamine  2. Hyperglycemia, drug-induced  - COMPLETE METABOLIC PANEL WITH GFR - Fructosamine - Glutamic acid decarboxylase auto abs  3. Metastatic breast carcinoma ([place])  - CBC with Differential/Platelet - COMPLETE METABOLIC PANEL WITH GFR  4. Medication management  - CBC with Differential/Platelet - COMPLETE METABOLIC PANEL WITH GFR - Fructosamine - Glutamic acid decarboxylase auto abs  - discussed meds/SE's & insulin mgmt. Over 40 minutes of exam, counseling, chart review and  complex critical decision making was performed     .

## 2018-03-26 NOTE — Patient Instructions (Addendum)
Piqray 150 mg - 1 tablet (day22) 8:30  AM   (03/14/2018)   -   FBG   196Mg %   ->    Nov 70/30  15 u 1:30 PM  (03/14/2018)  -    CBG  139Mg %    ->    Nov 70/30  10 u 6:00 PM   (03/14/2018)  -   CBG   211Mg %    ->     Nov 70/30 15 u 10:00 PM    (03/14/2018)   -    CBG  230Mg %   ->Lantus120u + Nov 70/30  10 u   _____ 170Units total for Fri +++++++++++++++++++++++++++++++++++++++++++++++++++++++++++++++++++++++++++++++++++++++++++++++  Piqray 150 mg - 1 tablet (day23) 8:30  AM   (03/15/2018)   -   FBG   152Mg %   ->   Nov 70/30  15 u 1:30 PM  (03/15/2018)  -    CBG   79 Mg%   ->        6:00 PM   (03/15/2018)  -   CBG   180Mg %    ->     Nov 70/30 10 u 10:00 PM    (03/15/2018)   -    CBG  193 Mg%   ->Lantus 120u +  _____ 145Units total for  Sat +++++++++++++++++++++++++++++++++++++++++++++++++++++++++++++++++++++++++++++++++++++++++++++++  Piqray 150 mg - 1 tablet (day24) 8:30  AM   (03/16/2018)   -   FBG  122 Mg%  ->   Nov 70/30   5u 1:30 PM  (03/16/2018)  -    CBG  97Mg %    ->     6:00 PM   (03/16/2018)  -   CBG  189  Mg%    ->       10:00 PM    (03/16/2018)   -    CBG  150 Mg%  -> Lantus110 u+   _____  115Units total for Sun +++++++++++++++++++++++++++++++++++++++++++++++++++++++++++++++++++++++++++++++++++++++++++++++  Piqray 150 mg - 1 tablet (day25) 8:30  AM   (03/17/2018)   -   FBG  174 Mg%  ->    Nov 70/30  10u 1:30 PM  (03/17/2018)  -    CBG  168Mg %    ->    Nov 70/30 15 u 6:00 PM   (03/17/2018)  -   CBG  212 Mg%    ->     Nov 70/30 20 u 10:00 PM    (03/17/2018)   -    CBG  252Mg %  -> Lantus120u+Nov 70/3020 u _____ 185Units total for  Mon +++++++++++++++++++++++++++++++++++++++++++++++++++++++++++++++++++++++++++++++++++++++++++++++  Piqray 150 mg - 1 tablet (day26) 8:30  AM   (03/18/2018)   -   FBG   243 Mg%  ->boost Lantus 20 u+ Nov 70/30 30 u 1:30 PM  (03/18/2018)  -    CBG   155Mg %   ->     Nov 70/30 15 u  6:00 PM   (03/18/2018)  -   CBG  227 Mg%   ->     Nov 70/30 20 u 10:00 PM    (03/18/2018)   -    CBG  208Mg %  -> Lantus140u+ Nov 70/3015 u  _____ 240Units total for Tues +++++++++++++++++++++++++++++++++++++++++++++++++++++++++++++++++++++++++++++++++++++++++++++++  Piqray 150 mg - 1 tablet (day27) 8:30  AM   (03/19/2018)   -   FBG  147Mg %   ->    Nov 70/30  10 u  1:30 PM  (03/19/2018)  -    CBG  193Mg %    ->  Nov 70/30 10 u  6:00 PM   (03/19/2018)  -   CBG  161 Mg%     ->     Nov 70/30 15 u 10:00 PM    (03/19/2018)   -    CBG  143 Mg%  -> Lantus 140u+  _____ 175Units total for  Wed +++++++++++++++++++++++++++++++++++++++++++++++++++++++++++++++++++++++++++++++++++++++++++++++  Piqray 150 mg - 1 tablet (day28) 8:30  AM   (03/20/2018)   -    FBG  155Mg %    ->    Nov 70/30  10 u  1:30 PM  (03/20/2018)  -    CBG  186Mg %    ->     6:00 PM   (03/20/2018)  -   CBG  178 Mg%    ->     Nov 70/30 20 u 10:00 PM    (03/20/2018)   -    CBG  151  Mg%  -> Lantus140u+   _____ 170Units total for Thurs +++++++++++++++++++++++++++++++++++++++++++++++++++++++++++++++++++++++++++++++++++++++++++++++  Piqray 150 mg - 1 tablet (day29) 8:30  AM   (03/21/2018)   -    FBG  133 Mg%   ->      Nov 70/30  10 u  1:30 PM  (03/21/2018)  -    CBG  59 Mg%    ->     6:00 PM   (03/21/2018)  -    CBG  132 Mg%    ->       10:00 PM    (03/21/2018)   -     CBG  146 Mg%  -> Lantus140u+   _____ 150Units total for  Fri +++++++++++++++++++++++++++++++++++++++++++++++++++++++++++++++++++++++++++++++++++++++++++++++  Piqray 150 mg - 1 tablet (day30)  8:30  AM   (03/22/2018)   -    FBG  216 Mg%   ->       Nov 70/30  15u  1:30 PM  (03/22/2018)  -    CBG  81 Mg%    ->     6:00 PM   (03/22/2018)  -    CBG  193 Mg%    ->     Nov 70/30  15 u 10:00 PM    (03/22/2018)   -     CBG  153 Mg%  -> Lantus140u+    _____ 170Units total for Sat +++++++++++++++++++++++++++++++++++++++++++++++++++++++++++++++++++++++++++++++++++++++++++++++  Piqray 150 mg - 1 tablet (day31) 8:30  AM   (03/23/2018)   -    FBG  105Mg %    ->          1:30 PM  (03/23/2018)  -    CBG  113 Mg%     ->      6:00 PM   (03/23/2018)  -    CBG  125  Mg%     ->      10:00 PM    (03/23/2018)   -     CBG  181 Mg%   ->  Lantus140u+ Nov 70/3010 u   _____ 150Units total  forSun +++++++++++++++++++++++++++++++++++++++++++++++++++++++++++++++++++++++++++++++++++++++++++++++  Piqray 150 mg - 1 tablet (day32) 8:30  AM   (812/2019)   -     FBG  145 Mg%   ->      Nov 70/30 10 u   1:30 PM  (03/24/2018)  -    CBG 243 Mg%    ->    Nov 70/30 20 u  6:00 PM   (03/24/2018)  -    CBG 163 Mg%    ->     Nov 70/3015 u  10:00 PM    (03/24/2018)   -     CBG  207Mg %  -> Lantus 140u+    _____ 185 Units total for Mon +++++++++++++++++++++++++++++++++++++++++++++++++++++++++++++++++++++++++++++++++++++++++++++++  Piqray 150 mg - 1 tablet (day33) 8:30  AM   (813/2019)   -    FBG    59  Mg%   ->           1:30 PM  (03/25/2018)  -    CBG   119 Mg%    ->       6:00 PM   (03/25/2018)  -    CBG  148 Mg%    ->      Nov 70/3010 u  10:00 PM    (03/25/2018)   -     CBG  160 Mg%  -> Lantus130 u+    _____ 140   units total for Tues

## 2018-03-26 NOTE — Telephone Encounter (Signed)
Tried to reach regarding vm

## 2018-03-27 ENCOUNTER — Ambulatory Visit: Payer: Self-pay | Admitting: Emergency Medicine

## 2018-03-27 ENCOUNTER — Other Ambulatory Visit: Payer: Self-pay | Admitting: *Deleted

## 2018-03-27 MED ORDER — POTASSIUM CHLORIDE CRYS ER 20 MEQ PO TBCR: EXTENDED_RELEASE_TABLET | ORAL | 1 refills | Status: AC

## 2018-03-28 LAB — CBC WITH DIFFERENTIAL/PLATELET
BASOS ABS: 58 {cells}/uL (ref 0–200)
Basophils Relative: 0.8 %
Eosinophils Absolute: 73 cells/uL (ref 15–500)
Eosinophils Relative: 1 %
HEMATOCRIT: 37.3 % (ref 35.0–45.0)
Hemoglobin: 13.2 g/dL (ref 11.7–15.5)
Lymphs Abs: 1051 cells/uL (ref 850–3900)
MCH: 37.4 pg — AB (ref 27.0–33.0)
MCHC: 35.4 g/dL (ref 32.0–36.0)
MCV: 105.7 fL — AB (ref 80.0–100.0)
MPV: 10 fL (ref 7.5–12.5)
Monocytes Relative: 12.5 %
NEUTROS PCT: 71.3 %
Neutro Abs: 5205 cells/uL (ref 1500–7800)
PLATELETS: 155 10*3/uL (ref 140–400)
RBC: 3.53 10*6/uL — AB (ref 3.80–5.10)
RDW: 13.1 % (ref 11.0–15.0)
TOTAL LYMPHOCYTE: 14.4 %
WBC: 7.3 10*3/uL (ref 3.8–10.8)
WBCMIX: 913 {cells}/uL (ref 200–950)

## 2018-03-28 LAB — COMPLETE METABOLIC PANEL WITH GFR
AG RATIO: 1.1 (calc) (ref 1.0–2.5)
ALKALINE PHOSPHATASE (APISO): 103 U/L (ref 33–130)
ALT: 24 U/L (ref 6–29)
AST: 49 U/L — AB (ref 10–35)
Albumin: 3.7 g/dL (ref 3.6–5.1)
BILIRUBIN TOTAL: 1.6 mg/dL — AB (ref 0.2–1.2)
BUN: 8 mg/dL (ref 7–25)
CALCIUM: 9 mg/dL (ref 8.6–10.4)
CO2: 37 mmol/L — ABNORMAL HIGH (ref 20–32)
Chloride: 95 mmol/L — ABNORMAL LOW (ref 98–110)
Creat: 0.66 mg/dL (ref 0.50–0.99)
GFR, Est African American: 111 mL/min/{1.73_m2} (ref >=60)
GFR, Est Non African American: 96 mL/min/{1.73_m2} (ref >=60)
GLOBULIN: 3.3 g/dL (ref 1.9–3.7)
Glucose, Bld: 136 mg/dL — ABNORMAL HIGH (ref 65–99)
POTASSIUM: 3.2 mmol/L — AB (ref 3.5–5.3)
SODIUM: 139 mmol/L (ref 135–146)
Total Protein: 7 g/dL (ref 6.1–8.1)

## 2018-03-28 LAB — FRUCTOSAMINE: Fructosamine: 349 umol/L — ABNORMAL HIGH (ref 190–270)

## 2018-03-28 LAB — GLUTAMIC ACID DECARBOXYLASE AUTO ABS

## 2018-03-29 ENCOUNTER — Other Ambulatory Visit: Payer: Self-pay | Admitting: Internal Medicine

## 2018-03-29 NOTE — Progress Notes (Signed)
(02/08/2018) - FBG 357 mg% ->  Nov 70/30  15 u  6:30 pm (02/08/2018) - CBG 482 mg% ->  Nov 70/30 30 u 11:00 pm (02/08/2018) - CBG 486 mg% ->Lantus 20 u   ____65 u +++++++++++++++++++++++++++++++++++++++++++++++++++++++++++++++++++++++++ 8:30 am (02/09/2018) - FBG 331 mg% ->  Nov 70/30 45 u  6:30 pm (02/09/2018) - CBG 410 mg% ->  Nov 70/30  50 u 11:00pm (02/09/2018) -CBG426 mg% -> Lantus 20 u+ Nov 70/30  20 u  ____ 135 u  ++++++++++++++++++++++++++++++++++++++++++++++++++++++++++++++++++++++++++ 9:00 am (02/11/2018) - FBG 236mg % ->  Nov 70/30 20u 6:30 pm (02/11/2018) - CBG 329mg % ->  Nov 70/30  20u  11:00 pm (02/11/2018) - CBG428 mg% -> ->  Nov 70/30  60 u  ____100 u  ++++++++++++++++++++++++++++++++++++++++++++++++++++++++++++++++++++++++++ 9:30 am (02/12/2018) - FBG 332  mg% -> Lantus 20 u +    Nov 70/30 60 u  4:00 pm (02/12/2018) - CBG  354 mg% -> 7:00 pm (02/12/2018) - CBG 254 mg% ->  Nov 70/30  20 u  11:00 pm (02/12/2018) - CBG199mg %-> Lantus 60 u  ____ 160 u +++++++++++++++++++++++++++++++++++++++++++++++++++++++++++++++++++++++++++ 9:00 am (02/13/2018) - FBG  501mg % ->   Nov 70/30  60u 3:30pm (02/13/2018) -  10:00 pm(02/13/2018) - CBG  556 mg% -> Lantus 80u +  Nov 70/30   60u  ____200 u +++++++++++++++++++++++++++++++++++++++++++++++++++++++++++++++++++++++++++ 8:30 am (02/14/2018) - FBG 377 mg% ->   Nov 70/30  70u 12:00(02/14/2018)- CBG 451 mg%   Nov 70/30  80 u 6:00 pm (02/14/2018) - CBG 452 mg% ->   Nov 70/30  80 u 9:00 pm (02/14/2018) - CBG 428mg % -> Lantus 120 u ____350 u TOTAL for Friday  +++++++++++++++++++++++++++++++++++++++++++++++++++++++++++++++++++++++++++ 8:00 am (02/15/2018)  - FBG 288mg % ->   Nov 70/30  80 u 3:00 pm (02/15/2018) - CBG 436mg % ->  Nov 70/30  100u 7:00 pm (02/15/2018) - CBG 443 mg% ->  Nov 70/30  100 u  10:30 pm (02/15/2018) - CBG 401 mg% -> Lantus 120 u +  Nov 70/30  100 u   ____500 uTOTAL for Saturday +++++++++++++++++++++++++++++++++++++++++++++++++++++++++++++++++++++++++++ NO Piqray 8:00 am (02/16/2018) - FBG 232 mg% ->   Nov 70/30  80 u 3:00 pm (02/16/2018) - CBG 313 mg% ->   Nov 70/30  80 u   7:00 pm (02/16/2018) - CBG 184 mg% ->   Nov 70/30 20u   10:00 pm (02/16/2018) - CBG 243 mg% -> No Lantus   Nov 70/30 40 u  ____ 220 u TOTAL for Sunday ++++++++++++++++++++++++++++++++++++++++++++++++++++++++++++++++++++++++++++ 10:00 am (02/17/2018) - FBG 72 mg% ->  No Insulin  12:00 pm (02/17/2018) - CBG  202 mg% ->   No Insulin   4:00 pm (02/17/2018) - CBG  299 mg% ->at office visit  -    Nov 70/30  30 u At 5:30 pm 11:00 PM (02/17/2018) - CBG  221 mg%    Nov 70/30  15 u At 11:15 pm  ____ 45 units TOTAL for Mon  +++++++++++++++++++++++++++++++++++++++++++++++++++++++++++++++++++++++++++++ 8:30Am (02/18/2018) - FBG147 mg% ->  Nov 70/30 15 u  2:00 Pm (02/18/2018) - CBG 215mg % -> Nov 70/30 15 u 7:00 Pm (02/18/2018) - CBG 194 mg% -> Nov 70/30 30 u 10:00 Pm (02/18/2018) - CBG 145 mg% ->Lantus 20 u     ____ 80Units TOTAL for Tues +++++++++++++++++++++++++++++++++++++++++++++++++++++++++++++++++++++++++++++++ 8:00 am (02/19/2018) - FBG230Mg % ->  Nov 70/3035 u 2:00 Pm (02/19/2018) - CBG 98 Mg% ->  No Insulin 7:00 Pm (02/19/2018) - CBG183Mg % ->  Nov 70/3020 u 11:00 Pm (02/19/2018) - CBG139Mg % ->Lantus 25 u  ____   80 Units TOTAL for Wed ++++++++++++++++++++++++++++++++++++++++++++++++++++++++++++++++++++++++++++++++ 8:00 AM(02/20/2018) - FBG122Mg % -> Nov 70/3020u 1:00PM(02/20/2018) - CBG 71Mg % -> No Insulin 6:00 PM (02/20/2018) - CBG103Mg % -> No Insulin 10:00 PM (02/20/2018) - CBG154Mg % ->Lantus 20 u   ____40Units totalfor Thurs ++++++++++++++++++++++++++++++++++++++++++++++++++++++++++++++++++++++++++++++++ Restart Piqray 150 mg - 1 tablet  8:00  AM (02/21/2018) - FBG  176Mg %  -> Nov 70/30  15u  2:30PM(02/21/2018) - CBG 149 Mg% ->     Episode of N/V 2 hr after took [place] &Metformin at 2:30 pm  6:30 PM (02/21/2018) - CBG  261Mg %  ->  Nov 70/30  15u 10:00 PM (02/21/2018) - CBG 236Mg % ->Lantus25 u   ____ 55Units totalfor  Fri +++++++++++++++++++++++++++++++++++++++++++++++++++++++++++++++++++++++++++++++  Piqray 150 mg 1 tablet (day 2)(Metformin on hold ->) 8:00  AM  (02/22/2018)  -  FBG  145Mg % ->  Nov 70/30  10u  2:30 PM(02/22/2018) - CBG 112 Mg% ->      6:30 PM (02/22/2018)  -  CBG 213 Mg% ->  Nov 70/30  10u 10:00 PM  (02/22/2018)  -  CBG 235Mg % ->Lantus 25u   ____  45Units total for Sat ++++++++++++++++++++++++++++++++++++++++++++++++++++++++++++++++++++++++++++++++  Piqray 150 mg - 1 tablet (day3) 8:00  AM  (02/23/2018)  -  FBG  171Mg % ->  Nov 70/30   10u  2:00 PM (02/23/2018) - CBG  207Mg % ->     Nov 70/30  10u  6:00 PM  (02/23/2018)  -  CBG  191Mg % ->   Nov 70/30   10u 10:00 PM  (02/23/2018)  -  CBG  245Mg % ->Lantus35 u  Nov 70/30  10u  _____  75Units total  forSun +++++++++++++++++++++++++++++++++++++++++++++++++++++++++++++++++++++++++++++++++  Piqray 150 mg - 1 tablet (day4) 8:00  AM  (02/24/2018)  -  FBG  148 Mg% ->       2:30 PM (02/24/2018)  -  CBG  274Mg % ->    Nov 70/30   30u  6:00 PM  (02/24/2018)  -  CBG  347Mg % ->   Nov 70/30  30u 10:00 PM  (02/24/2018)  -  CBG  419Mg % ->Lantus60u   Nov 70/30  20u  _____ 140Units total forMon +++++++++++++++++++++++++++++++++++++++++++++++++++++++++++++++++++++++++++++++++  Piqray 150 mg - 1 tablet (day5) 8:00  AM  (02/25/2018)  -  FBG 169 Mg% ->      Nov 70/30 30u  2:30 PM (02/25/2018)  -  CBG  176Mg % ->       Nov 70/30  30u  6:00 PM  (02/25/2018)  -  CBG  251 Mg% ->   Nov 70/30  30u 10:00  PM  (02/25/2018)   -  CBG  286Mg % -> Lantus 80u  Nov 70/30  30 u  _____ 200Units total  forTues +++++++++++++++++++++++++++++++++++++++++++++++++++++++++++++++++++++++++++++++++  Piqray 150 mg - 1 tablet (day6) 8:00  AM  (02/26/2018)  - FBG 93 Mg%  ->   Nov 70/30 25u 2:30 PM  (02/26/2018)  -  CBG  265 Mg% ->      Nov 70/30 40u 6:00 PM   (02/26/2018)  - CBG  313 Mg% ->    Nov 70/30  50u 10:00 PM   (02/26/2018)   -   CBG  389Mg % -> Lantus100 u Nov 70/30 40u _____ 255Units total forWed +++++++++++++++++++++++++++++++++++++++++++++++++++++++++++++++++++++++++++++++++  Piqray 150 mg - 1 tablet (day7)  8:00  AM  (02/27/2018)  -  FBG  293Mg %  ->     Nov 70/30  60 u 1:30 PM  (02/27/2018)  -   CBG   117Mg %  ->  Nov 70/30 40u 5:30  PM   (02/27/2018)  -  CBG    81Mg %  ->     10:00 PM   (02/27/2018)   -   CBG   92Mg %  -> Lantus 20 u   _____  120Units total  forThurs +++++++++++++++++++++++++++++++++++++++++++++++++++++++++++++++++++++++++++++++++  Piqray 150 mg - 1 tablet (day8) 8:00  AM  (02/28/2018)  -  FBG  121Mg %   ->    Nov 70/30 10u 1:30 PM  (02/28/2018)  -   CBG  237Mg %  ->     Nov 70/30 25u 5:30  PM   (02/28/2018)  -  CBG   269 Mg%   ->     Nov 70/30 35u 10:00 PM   (02/28/2018)   -   CBG  267Mg %   ->Lantus50u   Nov 70/30 15u   _____  135Units total forFri +++++++++++++++++++++++++++++++++++++++++++++++++++++++++++++++++++++++++++++++++  Piqray 150 mg - 1 tablet (day9) 8:30  AM  (03/01/2018)  -  FBG  307Mg %   ->   Nov 70/30 40u 1:30 PM  (03/01/2018)  -   CBG  157Mg %   ->   Nov 70/30 20u 5:30  PM   (03/01/2018)  -  CBG   139Mg %   ->   Nov 70/30 25u 10:00 PM   (03/01/2018)   -   CBG  193Mg %  ->Lantus70u  Nov 70/30 10u  _____  165Units total  forSat ++++++++++++++++++++++++++++++++++++++++++++++++++++++  Piqray 150 mg - 1 tablet (day10) 8:30  AM  (03/02/2018)  -   FBG  286Mg %   ->    Nov 70/30 35u 1:30 PM  (03/02/2018)  -   CBG  306 Mg%   ->    Nov 70/30 60u 5:30  PM   (03/02/2018)  -   CBG   359 Mg%   ->    Nov 70/30 60u 10:00 PM   (03/02/2018)   -   CBG   330Mg %  ->Lantus100u+ Nov 70/30 60u   _____  315Units total forSun +++++++++++++++++++++++++++++++++++++++++++++++++++++++++++++++++++++++++++++++++++++++++++++++  Piqray 150 mg - 1 tablet (day11) 8:30  AM  (03/03/2018)  -   FBG  121Mg %  ->     Nov 70/30 20u 1:30 PM  (03/03/2018)  -   CBG   112 Mg%   ->     Nov 70/30 20u 5:30 PM  (03/03/2018)  -   CBG   219Mg %   ->    Nov 70/30 35u 10:00 PM   (03/03/2018)   -   CBG   257Mg %  ->Lantus 120u + Nov 70/30 40u  _____  235Units total  forMon +++++++++++++++++++++++++++++++++++++++++++++++++++++++++++++++++++++++++++++++++++++++++++++++  Piqray 150 mg - 1 tablet (day12) 8:30  AM  (03/04/2018)  -   FBG  112Mg %  ->   Nov 70/30 20u 1:30 PM  (03/04/2018)  -   CBG    97 Mg%   ->     5:30 PM  (03/04/2018)  -   CBG   198Mg %   ->    Nov 70/30 10u 10:00 PM   (03/04/2018)   -   CBG   211Mg %  ->Lantus 110 u + Nov 70/30 10u  _____  150Units total forTues +++++++++++++++++++++++++++++++++++++++++++++++++++++++++++++++++++++++++++++++++++++++++++++++  Piqray 150 mg - 1 tablet (day13) 8:30  AM  (03/05/2018)  -   FBG   126  Mg%  ->    Nov 70/30  20 u 1:30 PM  (03/05/2018)  -   CBG    82 Mg%   ->  6:00 PM  (03/05/2018)  -   CBG   188 Mg%   ->     Nov 70/30  20 u 10:00 PM    (03/05/2018)   -   CBG   207 Mg%  ->Lantus 110 u + Nov 70/30  10 u  _____ 160 Units total  forWed +++++++++++++++++++++++++++++++++++++++++++++++++++++++++++++++++++++++++++++++++++++++++++++++  Piqray 150 mg - 1 tablet (day14) 8:30  AM   (03/06/2018)  -   FBG 132  Mg%  ->   Nov 70/30  15 u 1:30 PM  (03/06/2018)  -   CBG 229 Mg%   ->    Nov 70/30  20 u 6:00 PM  (03/06/2018)  -   CBG 187Mg %   ->      10:00 PM    (03/06/2018)   -   CBG 151 Mg%  ->Lantus 110u + Nov 70/30  25 u  _____ 170Units total forThurs +++++++++++++++++++++++++++++++++++++++++++++++++++++++++++++++++++++++++++++++++++++++++++++++  Piqray 150 mg - 1 tablet (day15) 8:30  AM   (03/07/2018)  -   FBG  235Mg %   ->    Nov 70/30  25u 1:30 PM  (03/07/2018)  -   CBG  202Mg %   ->    Nov 70/30  25u 6:00 PM  (03/07/2018)  -   CBG  190Mg %   ->    Nov 70/3025u 10:00 PM    (03/07/2018)   -   CBG  185Mg %  ->Lantus 110u  +   _____ 185Units total  forFri +++++++++++++++++++++++++++++++++++++++++++++++++++++++++++++++++++++++++++++++++++++++++++++++  Piqray 150 mg - 1 tablet (day16 8:30  AM   (03/08/2018)  -   FBG  120Mg %   ->    Nov 70/30  15u 1:30 PM  (03/08/2018)  -   CBG   105 Mg%   ->      6:00 PM  (03/08/2018)  -   CBG  103Mg %   ->     10:00 PM    (03/08/2018)   -   CBG  188Mg %  ->Lantus 110u  +   _____  125Units total for Sat +++++++++++++++++++++++++++++++++++++++++++++++++++++++++++++++++++++++++++++++++++++++++++++++  Piqray 150 mg - 1 tablet (day17) 8:30  AM   (03/09/2018)  -   FBG  140 Mg%   ->   Nov 70/30  15 u 1:30 PM  (03/09/2018)  -    CBG   114 Mg%   ->      6:00 PM  (03/09/2018)  -    CBG  219 Mg%   ->     10:00 PM    (03/09/2018)   -    CBG  255 Mg%  ->Lantus120u  +   Nov 70/30 10 u  _____ 145Units total for  Sun +++++++++++++++++++++++++++++++++++++++++++++++++++++++++++++++++++++++++++++++++++++++++++++++  Piqray 150 mg - 1 tablet (day18) 8:30  AM   (03/10/2018)  -   FBG  224Mg %   ->    Nov 70/30 25 u 1:30 PM  (03/10/2018)  -    CBG   87 Mg%   ->       6:00 PM   (03/10/2018)  -   CBG  79 Mg%   ->      10:00 PM    (03/10/2018)   -   CBG  197Mg %  ->Lantus120u  + Nov 70/30 20 u  _____ 165Units total for Mon +++++++++++++++++++++++++++++++++++++++++++++++++++++++++++++++++++++++++++++++++++++++++++++++  Piqray 150 mg - 1 tablet (day19) 8:30  AM   (03/11/2018)  -   FBG   192 Mg%   ->      Nov 70/30  15 u 1:30 PM  (03/11/2018)  -    CBG   82 Mg%   ->     0   6:00 PM   (03/11/2018)  -   CBG  220Mg %   ->     Nov 70/3020 u 10:00 PM    (03/11/2018)   -   CBG  232Mg %  ->Lantus120u  +  Nov 70/30 10 u   _____ 165Units total for  Tues +++++++++++++++++++++++++++++++++++++++++++++++++++++++++++++++++++++++++++++++++++++++++++++++  Piqray 150 mg - 1 tablet (day20) 8:30  AM   (03/12/2018)  -    FBG   231 Mg%   ->      Nov 70/30  20 u 1:30 PM  (03/12/2018)  -    CBG  184 Mg%    ->     Nov 70/30  25 u 6:00 PM   (03/12/2018)  -   CBG   88Mg %    ->     0  10:00 PM    (03/12/2018)   -   CBG  133Mg %   ->Lantus 110u +    _____ 155Units total for Wed +++++++++++++++++++++++++++++++++++++++++++++++++++++++++++++++++++++++++++++++++++++++++++++++  Piqray 150 mg - 1 tablet (day21) 8:30  AM   (03/13/2018)   -   FBG   179Mg %   ->      Nov 70/30  10 u 1:30 PM  (03/13/2018)  -    CBG  89 Mg%    ->      6:00 PM   (03/13/2018)  -   CBG   144Mg %    ->     10:00 PM    (03/13/2018)   -    CBG  123Mg %   -> Lantus110u +  _____ 120 Units total for  Thurs +++++++++++++++++++++++++++++++++++++++++++++++++++++++++++++++++++++++++++++++++++++++++++++++  Piqray 150 mg - 1 tablet (day22) 8:30  AM   (03/14/2018)   -   FBG   196Mg %   ->    Nov 70/30  15 u 1:30 PM  (03/14/2018)  -    CBG  139Mg %    ->    Nov 70/30  10 u 6:00 PM   (03/14/2018)  -   CBG   211Mg %    ->     Nov 70/30   15 u 10:00 PM    (03/14/2018)   -    CBG  230Mg %   ->Lantus120 u   + Nov 70/30  10 u       _____ 170Units total for Fri +++++++++++++++++++++++++++++++++++++++++++++++++++++++++++++++++++++++++++++++++++++++++++++++  Piqray 150 mg - 1 tablet (day23) 8:30  AM   (03/15/2018)   -   FBG   152 Mg%   ->   Nov 70/30  15 u 1:30 PM  (03/15/2018)  -    CBG     79 Mg%    ->          6:00 PM   (03/15/2018)  -   CBG   180Mg %    ->     Nov 70/30    10 u 10:00 PM    (03/15/2018)   -    CBG  193 Mg%  -> Lantus 120u +     _____ 145 Units total for  Sat +++++++++++++++++++++++++++++++++++++++++++++++++++++++++++++++++++++++++++++++++++++++++++++++  Piqray 150 mg - 1 tablet (day24) 8:30  AM   (03/16/2018)   -   FBG  150  Mg%   ->   Nov 70/30   10 u      1:30 PM  (03/16/2018)  -    CBG  139    Mg%    ->              6:00 PM   (03/16/2018)  -   CBG   189   Mg%    ->     Nov 70/30     10 u  10:00 PM    (03/16/2018)   -    CBG    Mg%  -> Lantus110 u +   _____ 120 Units total for Sun +++++++++++++++++++++++++++++++++++++++++++++++++++++++++++++++++++++++++++++++++++++++++++++++                                                          Piqray 150 mg - 1 tablet       (day  25) 8:30     AM                       (03/17/2018)       -            FBG     174       Mg%           ->                                    Nov  70/30   10 u      1:30     PM                        (03/17/2018)       -            CBG    168       Mg%            ->                                   Nov  70/30   15 u 6:00     PM                        (03/17/2018)       -            CBG    212       Mg%            ->                                   Nov  70/30   20 u 10:00   PM                        (03/17/2018)       -            CBG    252       Mg%            ->    Lantus 120   u  +  Nov  70/30   20 u  _____  185  Units total for   Mon +++++++++++++++++++++++++++++++++++++++++++++++++++++++++++++++++++++++++++++++++++++++++++++++                                                          Piqray 150 mg - 1 tablet       (day  26) 8:30     AM                       (03/18/2018)       -            FBG      243      Mg%         ->   boost Lantus 20 u  +   Nov  70/30   30 u   1:30     PM                        (03/18/2018)       -            CBG     155      Mg%         ->                                       Nov  70/30   15 u           6:00     PM                        (03/18/2018)       -            CBG      227     Mg%         ->                                       Nov  70/30   20 u    10:00   PM                        (03/18/2018)       -            CBG      208     Mg%         ->    Lantus 140    u  +     Nov  70/30   15  u  _____   240 Units total for Tues  +++++++++++++++++++++++++++++++++++++++++++++++++++++++++++++++++++++++++++++++++++++++++++++++                                                          Piqray 150 mg - 1 tablet       (day  27) 8:30     AM                       (03/19/2018)       -            FBG    147     Mg%             ->                                      Nov  70/30   10 u       1:30     PM                        (03/19/2018)       -            CBG    193      Mg%            ->                                     Nov  70/30   10 u          6:00     PM                        (03/19/2018)       -            CBG    161     Mg%             ->                                     Nov  70/30   15 u   10:00   PM                        (03/19/2018)       -            CBG    143      Mg%            ->    Lantus   140  u  +  _____  175  Units total for  Wed   +++++++++++++++++++++++++++++++++++++++++++++++++++++++++++++++++++++++++++++++++++++++++++++++                                                          Piqray 150 mg - 1 tablet       (day  28) 8:30     AM                       (03/20/2018)       -             FBG    155     Mg%             ->                                   Nov  70/30   10 u       1:30     PM                        (03/20/2018)       -            CBG    186      Mg%            ->                                                6:00     PM                        (03/20/2018)       -            CBG    178      Mg%            ->                                    Nov  70/30   20 u 10:00   PM                        (03/20/2018)       -            CBG    151      Mg%            ->    Lantus 140 u  +  _____  170  Units total for  Thurs  +++++++++++++++++++++++++++++++++++++++++++++++++++++++++++++++++++++++++++++++++++++++++++++++                                                          Piqray 150 mg - 1 tablet       (day  29) 8:30     AM                       (03/21/2018)       -              FBG   133      Mg%            ->                                     Nov  70/30   10 u       1:30     PM                        (03/21/2018)       -              CBG     59     Mg%            ->                                                 6:00     PM                        (03/21/2018)       -              CBG   132      Mg%            ->                                        10:00   PM                        (03/21/2018)       -              CBG   146      Mg%            ->    Lantus 140  u  +  _____    150    Units total for  Fri   +++++++++++++++++++++++++++++++++++++++++++++++++++++++++++++++++++++++++++++++++++++++++++++++                                                          Piqray 150 mg - 1 tablet       (day  30)  8:30     AM                       (03/22/2018)       -              FBG   216      Mg%            ->                                      Nov  70/30   15 u       1:30     PM                        (03/22/2018)       -              CBG     81     Mg%            ->                                                  6:00     PM                        (03/22/2018)       -              CBG   193      Mg%            ->                                     Nov  70/30    15 u 10:00   PM                        (03/22/2018)       -              CBG   153     Mg%            ->    Lantus 140  u  +  _____   170    Units total for  Sat  +++++++++++++++++++++++++++++++++++++++++++++++++++++++++++++++++++++++++++++++++++++++++++++++                                                          Piqray 150 mg - 1 tablet       (day  31) 8:30     AM                       (03/23/2018)       -              FBG     105   Mg%             ->                                                1:30     PM                        (03/23/2018)       -              CBG    113   Mg%              ->                                                6:00     PM                        (03/23/2018)       -              CBG    125   Mg%              ->                                           10:00   PM                        (03/23/2018)       -              CBG    181   Mg%              ->        Lantus 140  u  +   Nov  70/30 10 u  _____   150  Units total for Sun   +++++++++++++++++++++++++++++++++++++++++++++++++++++++++++++++++++++++++++++++++++++++++++++++                                                          Piqray 150 mg - 1 tablet       (day  32) 8:30     AM                       (812/2019)       -                FBG   145    Mg%             ->                                       Nov  70/30  10 u        1:30     PM                        (03/24/2018)       -              CBG    243    Mg%            ->                                       Nov  70/30 20 u           6:00     PM                        (03/24/2018)       -              CBG    163    Mg%            ->                                       Nov  70/30   15 u 10:00   PM                        (03/24/2018)       -              CBG     207   Mg%            ->     Lantus 140 u  +  _____  185     Units total for  Mon  +++++++++++++++++++++++++++++++++++++++++++++++++++++++++++++++++++++++++++++++++++++++++++++++                                                          Piqray 150 mg - 1 tablet       (day  33)  8:30     AM                       (813/2019)       -                FBG      59   Mg%            ->                                                 1:30     PM                        (03/25/2018)       -              CBG    119   Mg%            ->                                                   6:00     PM                        (03/25/2018)       -              CBG    148   Mg%            ->                                       Nov  70/30  10 u  10:00   PM                        (03/25/2018)       -              CBG    160  Mg%            ->     Lantus  130  u  +  _____   140    Units total for  Tues   +++++++++++++++++++++++++++++++++++++++++++++++++++++++++++++++++++++++++++++++++++++++++++++++                                                          Piqray 150 mg - 1 tablet       (day  34) 8:30     AM                         (814/2019)        -               FBG     187    Mg%           ->                                      Nov  70/30  15 u       1:30     PM                         (03/26/2018)       -               CBG       76    Mg%            ->                                                  6:00     PM                         (03/26/2018)       -               CBG      127   Mg%            ->                                          10:00   PM                         (03/26/2018)       -               CBG      218   Mg%            ->     Lantus  135  u  +  _____     150   Units total for  Wed  +++++++++++++++++++++++++++++++++++++++++++++++++++++++++++++++++++++++++++++++++++++++++++++++                                                         Piqray 150 mg - 1 tablet       (day  35)  8:30     AM                       (815/2019)       -                FBG     185     Mg%            ->                                     Nov  70/30          1:30     PM                        (03/27/2018)       -              CBG      246    Mg%            ->                                     Nov  70/30   20 u         6:00     PM                        (03/27/2018)       -              CBG      278    Mg%            ->                                     Nov  70/30   25 u  10:00   PM                        (03/27/2018)       -              CBG      199    Mg%            ->     Lantus   140 u  +   Nov  70/30  _____    185  Units total for  Thurs   ++++++++++++++++++++++++++++++++++++++++++++++++++++++++++++++++++++++++++++++++++++++++++++++++                                                         Piqray 150 mg - 1 tablet       (day  36) 8:30     AM                        (03/28/2018)        -              FBG  196   Mg%            ->                                         Nov  70/30  15 u    1:30     PM                        (03/28/2018)        -               CBG  118   Mg%             ->                                                    6:00     PM                        (03/28/2018)        -               CBG    95   Mg%             ->                                            10:00   PM                        (03/28/2018)         -               CBG  209   Mg%              ->        Lantus  140 u  +  _____    155     Units total for Fri  ++++++++++++++++++++++++++++++++++++++++++++++++++++++++++++++++++++++++++++++++++++++++++++++++                                                          Piqray 150 mg - 1 tablet       (day  37) 8:30     AM                       (03/29/2018)        -                FBG    238  Mg%            ->                                       Nov  70/30  20 u 1:30     PM                        (03/29/2018)         -               CBG   194   Mg%            ->                                                   6:00     PM                        (03/29/2018)         -               CBG   138   Mg%             ->                                          10:00   PM                        (03/29/2018)         -               CBG   267   Mg%            ->     Lantus 140 u   +      Nov  70/30  10 u  _____     170    Units total for Sat   +++++++++++++++++++++++++++++++++++++++++++++++++++++++++++++++++++++++++++++++++++++++++++++++                                                           Piqray 150 mg - 1 tablet       (day  38) 8:30     AM                        (03/30/2018)        -               FBG             Mg%           ->                                      Nov  70/30          1:30     PM                        (03/30/2018)         -               CBG            Mg%            ->                                     Nov  70/30            6:00     PM                        (03/30/2018)         -               CBG            Mg%            ->                                     Nov  70/30    10:00   PM                        (03/30/2018)         -               CBG            Mg%            ->     Lantus        u  +     Nov  70/30  _____                  Units total for Sun  +++++++++++++++++++++++++++++++++++++++++++++++++++++++++++++++++++++++++++++++++++++++++++++++                                                                 Piqray 150 mg - 1 tablet       (day  39) 8:30     AM                       (03/31/2018)         -               FBG            Mg%           ->                                       Nov  70/30          1:30     PM                        (03/31/2018)         -               CBG            Mg%            ->                                     Nov  70/30            6:00     PM                        (03/31/2018)         -               CBG            Mg%            ->                                     Nov  70/30    10:00   PM                        (03/31/2018)         -               CBG            Mg%            ->     Lantus        u  +     Nov  70/30  _____                 Units total for Mon  +++++++++++++++++++++++++++++++++++++++++++++++++++++++++++++++++++++++++++++++++++++++++++++++

## 2018-03-30 ENCOUNTER — Encounter: Payer: Self-pay | Admitting: Internal Medicine

## 2018-03-30 ENCOUNTER — Other Ambulatory Visit: Payer: Self-pay | Admitting: Internal Medicine

## 2018-03-30 NOTE — Progress Notes (Unsigned)
02/08/2018) - FBG 357 mg% ->  Nov 70/30  15 u  6:30 pm (02/08/2018) - CBG 482 mg% ->  Nov 70/30 30 u 11:00 pm (02/08/2018) - CBG 486 mg% ->Lantus 20 u   ____65 u +++++++++++++++++++++++++++++++++++++++++++++++++++++++++++++++++++++++++ 8:30 am (02/09/2018) - FBG 331 mg% ->  Nov 70/30 45 u  6:30 pm (02/09/2018) - CBG 410 mg% ->  Nov 70/30  50 u 11:00pm (02/09/2018) -CBG426 mg% -> Lantus 20 u+ Nov 70/30  20 u  ____ 135 u  ++++++++++++++++++++++++++++++++++++++++++++++++++++++++++++++++++++++++++ 9:00 am (02/11/2018) - FBG 236mg % ->  Nov 70/30 20u 6:30 pm (02/11/2018) - CBG 329mg % ->  Nov 70/30  20u  11:00 pm (02/11/2018) - CBG428 mg% -> ->  Nov 70/30  60 u  ____100 u  ++++++++++++++++++++++++++++++++++++++++++++++++++++++++++++++++++++++++++ 9:30 am (02/12/2018) - FBG 332  mg% -> Lantus 20 u +    Nov 70/30 60 u  4:00 pm (02/12/2018) - CBG  354 mg% -> 7:00 pm (02/12/2018) - CBG 254 mg% ->  Nov 70/30  20 u  11:00 pm (02/12/2018) - CBG199mg %-> Lantus 60 u  ____ 160 u +++++++++++++++++++++++++++++++++++++++++++++++++++++++++++++++++++++++++++ 9:00 am (02/13/2018) - FBG  501mg % ->   Nov 70/30  60u 3:30pm (02/13/2018) -  10:00 pm(02/13/2018) - CBG  556 mg% -> Lantus 80u +  Nov 70/30   60u  ____200 u +++++++++++++++++++++++++++++++++++++++++++++++++++++++++++++++++++++++++++ 8:30 am (02/14/2018) - FBG 377 mg% ->   Nov 70/30  70u 12:00(02/14/2018)- CBG 451 mg%   Nov 70/30  80 u 6:00 pm (02/14/2018) - CBG 452 mg% ->   Nov 70/30  80 u 9:00 pm (02/14/2018) - CBG 428mg % -> Lantus 120 u ____350 u TOTAL for Friday  +++++++++++++++++++++++++++++++++++++++++++++++++++++++++++++++++++++++++++ 8:00 am (02/15/2018)  - FBG 288mg % ->   Nov 70/30  80 u 3:00 pm (02/15/2018) - CBG 436mg % ->  Nov 70/30  100u 7:00 pm (02/15/2018) - CBG 443 mg% ->  Nov 70/30  100 u  10:30 pm (02/15/2018) - CBG 401 mg% -> Lantus 120 u +  Nov 70/30  100 u   ____500 uTOTAL for Saturday +++++++++++++++++++++++++++++++++++++++++++++++++++++++++++++++++++++++++++ NO Piqray 8:00 am (02/16/2018) - FBG 232 mg% ->   Nov 70/30  80 u 3:00 pm (02/16/2018) - CBG 313 mg% ->   Nov 70/30  80 u   7:00 pm (02/16/2018) - CBG 184 mg% ->   Nov 70/30 20u   10:00 pm (02/16/2018) - CBG 243 mg% -> No Lantus   Nov 70/30 40 u  ____ 220 u TOTAL for Sunday ++++++++++++++++++++++++++++++++++++++++++++++++++++++++++++++++++++++++++++ 10:00 am (02/17/2018) - FBG 72 mg% ->  No Insulin  12:00 pm (02/17/2018) - CBG  202 mg% ->   No Insulin   4:00 pm (02/17/2018) - CBG  299 mg% ->at office visit  -    Nov 70/30  30 u At 5:30 pm 11:00 PM (02/17/2018) - CBG  221 mg%    Nov 70/30  15 u At 11:15 pm  ____ 45 units TOTAL for Mon  +++++++++++++++++++++++++++++++++++++++++++++++++++++++++++++++++++++++++++++ 8:30Am (02/18/2018) - FBG147 mg% ->  Nov 70/30 15 u  2:00 Pm (02/18/2018) - CBG 215mg % -> Nov 70/30 15 u 7:00 Pm (02/18/2018) - CBG 194 mg% -> Nov 70/30 30 u 10:00 Pm (02/18/2018) - CBG 145 mg% ->Lantus 20 u     ____ 80Units TOTAL for Tues +++++++++++++++++++++++++++++++++++++++++++++++++++++++++++++++++++++++++++++++ 8:00 am (02/19/2018) - FBG230Mg % ->  Nov 70/3035 u 2:00 Pm (02/19/2018) - CBG 98 Mg% ->  No Insulin 7:00 Pm (02/19/2018) - CBG183Mg % ->  Nov 70/3020 u 11:00 Pm (02/19/2018) - CBG139Mg % ->Lantus 25 u  ____   80 Units TOTAL for Wed ++++++++++++++++++++++++++++++++++++++++++++++++++++++++++++++++++++++++++++++++ 8:00 AM(02/20/2018) - FBG122Mg % -> Nov 70/3020u 1:00PM(02/20/2018) - CBG 71Mg % -> No Insulin 6:00 PM (02/20/2018) - CBG103Mg % -> No Insulin 10:00 PM (02/20/2018) - CBG154Mg % ->Lantus 20 u   ____40Units totalfor Thurs ++++++++++++++++++++++++++++++++++++++++++++++++++++++++++++++++++++++++++++++++ Restart Piqray 150 mg - 1 tablet  8:00  AM (02/21/2018) - FBG  176Mg %  -> Nov 70/30  15u  2:30PM(02/21/2018) - CBG 149 Mg% ->     Episode of N/V 2 hr after took [place] &Metformin at 2:30 pm  6:30 PM (02/21/2018) - CBG  261Mg %  ->  Nov 70/30  15u 10:00 PM (02/21/2018) - CBG 236Mg % ->Lantus25 u   ____ 55Units totalfor  Fri +++++++++++++++++++++++++++++++++++++++++++++++++++++++++++++++++++++++++++++++  Piqray 150 mg 1 tablet (day 2)(Metformin on hold ->) 8:00  AM  (02/22/2018)  -  FBG  145Mg % ->  Nov 70/30  10u  2:30 PM(02/22/2018) - CBG 112 Mg% ->      6:30 PM (02/22/2018)  -  CBG 213 Mg% ->  Nov 70/30  10u 10:00 PM  (02/22/2018)  -  CBG 235Mg % ->Lantus 25u   ____  45Units total for Sat ++++++++++++++++++++++++++++++++++++++++++++++++++++++++++++++++++++++++++++++++  Piqray 150 mg - 1 tablet (day3) 8:00  AM  (02/23/2018)  -  FBG  171Mg % ->  Nov 70/30   10u  2:00 PM (02/23/2018) - CBG  207Mg % ->     Nov 70/30  10u  6:00 PM  (02/23/2018)  -  CBG  191Mg % ->   Nov 70/30   10u 10:00 PM  (02/23/2018)  -  CBG  245Mg % ->Lantus35 u  Nov 70/30  10u  _____  75Units total  forSun +++++++++++++++++++++++++++++++++++++++++++++++++++++++++++++++++++++++++++++++++  Piqray 150 mg - 1 tablet (day4) 8:00  AM  (02/24/2018)  -  FBG  148 Mg% ->       2:30 PM (02/24/2018)  -  CBG  274Mg % ->    Nov 70/30   30u  6:00 PM  (02/24/2018)  -  CBG  347Mg % ->   Nov 70/30  30u 10:00 PM  (02/24/2018)  -  CBG  419Mg % ->Lantus60u   Nov 70/30  20u  _____ 140Units total forMon +++++++++++++++++++++++++++++++++++++++++++++++++++++++++++++++++++++++++++++++++  Piqray 150 mg - 1 tablet (day5) 8:00  AM  (02/25/2018)  -  FBG 169 Mg% ->      Nov 70/30 30u  2:30 PM (02/25/2018)  -  CBG  176Mg % ->       Nov 70/30  30u  6:00 PM  (02/25/2018)  -  CBG  251 Mg% ->   Nov 70/30  30u 10:00  PM  (02/25/2018)   -  CBG  286Mg % -> Lantus 80u  Nov 70/30  30 u  _____ 200Units total  forTues +++++++++++++++++++++++++++++++++++++++++++++++++++++++++++++++++++++++++++++++++  Piqray 150 mg - 1 tablet (day6) 8:00  AM  (02/26/2018)  - FBG 93 Mg%  ->   Nov 70/30 25u 2:30 PM  (02/26/2018)  -  CBG  265 Mg% ->      Nov 70/30 40u 6:00 PM   (02/26/2018)  - CBG  313 Mg% ->    Nov 70/30  50u 10:00 PM   (02/26/2018)   -   CBG  389Mg % -> Lantus100 u Nov 70/30 40u _____ 255Units total forWed +++++++++++++++++++++++++++++++++++++++++++++++++++++++++++++++++++++++++++++++++  Piqray 150 mg - 1 tablet (day7)  8:00  AM  (02/27/2018)  -  FBG  293Mg %  ->     Nov 70/30  60 u 1:30 PM  (02/27/2018)  -   CBG   117Mg %  ->  Nov 70/30 40u 5:30  PM   (02/27/2018)  -  CBG    81Mg %  ->     10:00 PM   (02/27/2018)   -   CBG   92Mg %  -> Lantus 20 u   _____  120Units total  forThurs +++++++++++++++++++++++++++++++++++++++++++++++++++++++++++++++++++++++++++++++++  Piqray 150 mg - 1 tablet (day8) 8:00  AM  (02/28/2018)  -  FBG  121Mg %   ->    Nov 70/30 10u 1:30 PM  (02/28/2018)  -   CBG  237Mg %  ->     Nov 70/30 25u 5:30  PM   (02/28/2018)  -  CBG   269 Mg%   ->     Nov 70/30 35u 10:00 PM   (02/28/2018)   -   CBG  267Mg %   ->Lantus50u   Nov 70/30 15u   _____  135Units total forFri +++++++++++++++++++++++++++++++++++++++++++++++++++++++++++++++++++++++++++++++++  Piqray 150 mg - 1 tablet (day9) 8:30  AM  (03/01/2018)  -  FBG  307Mg %   ->   Nov 70/30 40u 1:30 PM  (03/01/2018)  -   CBG  157Mg %   ->   Nov 70/30 20u 5:30  PM   (03/01/2018)  -  CBG   139Mg %   ->   Nov 70/30 25u 10:00 PM   (03/01/2018)   -   CBG  193Mg %  ->Lantus70u  Nov 70/30 10u  _____  165Units total  forSat ++++++++++++++++++++++++++++++++++++++++++++++++++++++  Piqray 150 mg - 1 tablet (day10) 8:30  AM  (03/02/2018)  -   FBG  286Mg %   ->    Nov 70/30 35u 1:30 PM  (03/02/2018)  -   CBG  306 Mg%   ->    Nov 70/30 60u 5:30  PM   (03/02/2018)  -   CBG   359 Mg%   ->    Nov 70/30 60u 10:00 PM   (03/02/2018)   -   CBG   330Mg %  ->Lantus100u+ Nov 70/30 60u   _____  315Units total forSun +++++++++++++++++++++++++++++++++++++++++++++++++++++++++++++++++++++++++++++++++++++++++++++++  Piqray 150 mg - 1 tablet (day11) 8:30  AM  (03/03/2018)  -   FBG  121Mg %  ->     Nov 70/30 20u 1:30 PM  (03/03/2018)  -   CBG   112 Mg%   ->     Nov 70/30 20u 5:30 PM  (03/03/2018)  -   CBG   219Mg %   ->    Nov 70/30 35u 10:00 PM   (03/03/2018)   -   CBG   257Mg %  ->Lantus 120u + Nov 70/30 40u  _____  235Units total  forMon +++++++++++++++++++++++++++++++++++++++++++++++++++++++++++++++++++++++++++++++++++++++++++++++  Piqray 150 mg - 1 tablet (day12) 8:30  AM  (03/04/2018)  -   FBG  112Mg %  ->   Nov 70/30 20u 1:30 PM  (03/04/2018)  -   CBG    97 Mg%   ->     5:30 PM  (03/04/2018)  -   CBG   198Mg %   ->    Nov 70/30 10u 10:00 PM   (03/04/2018)   -   CBG   211Mg %  ->Lantus 110 u + Nov 70/30 10u  _____  150Units total forTues +++++++++++++++++++++++++++++++++++++++++++++++++++++++++++++++++++++++++++++++++++++++++++++++  Piqray 150 mg - 1 tablet (day13) 8:30  AM  (03/05/2018)  -   FBG   126  Mg%  ->    Nov 70/30  20 u 1:30 PM  (03/05/2018)  -   CBG    82 Mg%   ->  6:00 PM  (03/05/2018)  -   CBG   188 Mg%   ->     Nov 70/30  20 u 10:00 PM    (03/05/2018)   -   CBG   207 Mg%  ->Lantus 110 u + Nov 70/30  10 u  _____ 160 Units total  forWed +++++++++++++++++++++++++++++++++++++++++++++++++++++++++++++++++++++++++++++++++++++++++++++++  Piqray 150 mg - 1 tablet (day14) 8:30  AM   (03/06/2018)  -   FBG 132  Mg%  ->   Nov 70/30  15 u 1:30 PM  (03/06/2018)  -   CBG 229 Mg%   ->    Nov 70/30  20 u 6:00 PM  (03/06/2018)  -   CBG 187Mg %   ->      10:00 PM    (03/06/2018)   -   CBG 151 Mg%  ->Lantus 110u + Nov 70/30  25 u  _____ 170Units total forThurs +++++++++++++++++++++++++++++++++++++++++++++++++++++++++++++++++++++++++++++++++++++++++++++++  Piqray 150 mg - 1 tablet (day15) 8:30  AM   (03/07/2018)  -   FBG  235Mg %   ->    Nov 70/30  25u 1:30 PM  (03/07/2018)  -   CBG  202Mg %   ->    Nov 70/30  25u 6:00 PM  (03/07/2018)  -   CBG  190Mg %   ->    Nov 70/3025u 10:00 PM    (03/07/2018)   -   CBG  185Mg %  ->Lantus 110u  +   _____ 185Units total  forFri +++++++++++++++++++++++++++++++++++++++++++++++++++++++++++++++++++++++++++++++++++++++++++++++  Piqray 150 mg - 1 tablet (day16 8:30  AM   (03/08/2018)  -   FBG  120Mg %   ->    Nov 70/30  15u 1:30 PM  (03/08/2018)  -   CBG   105 Mg%   ->      6:00 PM  (03/08/2018)  -   CBG  103Mg %   ->     10:00 PM    (03/08/2018)   -   CBG  188Mg %  ->Lantus 110u  +   _____  125Units total for Sat +++++++++++++++++++++++++++++++++++++++++++++++++++++++++++++++++++++++++++++++++++++++++++++++  Piqray 150 mg - 1 tablet (day17) 8:30  AM   (03/09/2018)  -   FBG  140 Mg%   ->   Nov 70/30  15 u 1:30 PM  (03/09/2018)  -    CBG   114 Mg%   ->      6:00 PM  (03/09/2018)  -    CBG  219 Mg%   ->     10:00 PM    (03/09/2018)   -    CBG  255 Mg%  ->Lantus120u  +   Nov 70/30 10 u  _____ 145Units total for  Sun +++++++++++++++++++++++++++++++++++++++++++++++++++++++++++++++++++++++++++++++++++++++++++++++  Piqray 150 mg - 1 tablet (day18) 8:30  AM   (03/10/2018)  -   FBG  224Mg %   ->    Nov 70/30 25 u 1:30 PM  (03/10/2018)  -    CBG   87 Mg%   ->       6:00 PM   (03/10/2018)  -   CBG  79 Mg%   ->      10:00 PM    (03/10/2018)   -   CBG  197Mg %  ->Lantus120u  + Nov 70/30 20 u  _____ 165Units total for Mon +++++++++++++++++++++++++++++++++++++++++++++++++++++++++++++++++++++++++++++++++++++++++++++++  Piqray 150 mg - 1 tablet (day19) 8:30  AM   (03/11/2018)  -   FBG   192 Mg%   ->      Nov 70/30  15 u 1:30 PM  (03/11/2018)  -    CBG   82 Mg%   ->     0   6:00 PM   (03/11/2018)  -   CBG  220Mg %   ->     Nov 32/2025 u 10:00 PM    (03/11/2018)   -   CBG  232Mg %  ->Lantus120u  +  Nov 70/30 10 u   _____ 165Units total for  Tues +++++++++++++++++++++++++++++++++++++++++++++++++++++++++++++++++++++++++++++++++++++++++++++++  Piqray 150 mg - 1 tablet (day20) 8:30  AM   (03/12/2018)  -    FBG   231 Mg%   ->      Nov 70/30  20 u 1:30 PM  (03/12/2018)  -    CBG  184 Mg%    ->     Nov 70/30  25 u 6:00 PM   (03/12/2018)  -   CBG   88Mg %    ->     0  10:00 PM    (03/12/2018)   -   CBG  133Mg %   ->Lantus 110u +    _____ 155Units total for Wed +++++++++++++++++++++++++++++++++++++++++++++++++++++++++++++++++++++++++++++++++++++++++++++++  Piqray 150 mg - 1 tablet (day21) 8:30  AM   (03/13/2018)   -   FBG   179Mg %   ->      Nov 70/30  10 u 1:30 PM  (03/13/2018)  -    CBG  89 Mg%    ->      6:00 PM   (03/13/2018)  -   CBG   144Mg %    ->     10:00 PM    (03/13/2018)   -    CBG  123Mg %   -> Lantus110u +  _____ 120Units total for  Thurs +++++++++++++++++++++++++++++++++++++++++++++++++++++++++++++++++++++++++++++++++++++++++++++++  Piqray 150 mg - 1 tablet (day22) 8:30  AM   (03/14/2018)   -   FBG   196Mg %   ->    Nov 70/30  15 u 1:30 PM  (03/14/2018)  -    CBG  139Mg %    ->    Nov 70/30  10 u 6:00 PM   (03/14/2018)  -   CBG   211Mg %    ->     Nov 70/30 15 u 10:00 PM    (03/14/2018)   -    CBG  230Mg %   ->Lantus120u + Nov 70/30  10 u   _____ 170Units total for Fri +++++++++++++++++++++++++++++++++++++++++++++++++++++++++++++++++++++++++++++++++++++++++++++++  Piqray 150 mg - 1 tablet (day23) 8:30  AM   (03/15/2018)   -   FBG   152Mg %   ->   Nov 70/30  15 u 1:30 PM  (03/15/2018)  -    CBG   79 Mg%    ->       6:00 PM   (03/15/2018)  -   CBG   180Mg %    ->     Nov 70/30 10 u 10:00 PM    (03/15/2018)   -    CBG  193 Mg%  -> Lantus 120u +  _____ 145Units total for  Sat +++++++++++++++++++++++++++++++++++++++++++++++++++++++++++++++++++++++++++++++++++++++++++++++  Piqray 150 mg - 1 tablet (day24) 8:30  AM   (03/16/2018)   -   FBG  150Mg %   ->   Nov 70/30   10 u 1:30 PM  (03/16/2018)  -    CBG  139Mg %    ->      6:00 PM   (03/16/2018)  -   CBG  189 Mg%    ->  Nov 70/30 10 u 10:00 PM    (03/16/2018)   -    CBG    Mg%  -> Lantus110 u+  _____ 120Units total for Sun +++++++++++++++++++++++++++++++++++++++++++++++++++++++++++++++++++++++++++++++++++++++++++++++  Piqray 150 mg - 1 tablet (day25) 8:30  AM   (03/17/2018)   -   FBG  174 Mg%  ->    Nov 70/30  10u 1:30 PM  (03/17/2018)  -    CBG  168Mg %    ->    Nov 70/30 15 u 6:00 PM   (03/17/2018)  -   CBG  212 Mg%    ->     Nov 70/30 20 u 10:00 PM    (03/17/2018)   -    CBG  252Mg %  -> Lantus120u+Nov 70/3020 u _____ 185Units total for  Mon +++++++++++++++++++++++++++++++++++++++++++++++++++++++++++++++++++++++++++++++++++++++++++++++  Piqray 150 mg - 1 tablet (day26) 8:30  AM   (03/18/2018)   -   FBG   243 Mg%  ->boost Lantus 20 u+ Nov 70/30 30 u 1:30 PM  (03/18/2018)  -    CBG   155Mg %   ->     Nov 70/30 15 u  6:00 PM   (03/18/2018)  -   CBG  227 Mg%   ->     Nov 70/30 20 u 10:00 PM    (03/18/2018)   -    CBG  208Mg %  -> Lantus140u+ Nov 70/3015 u  _____ 240Units total for Tues +++++++++++++++++++++++++++++++++++++++++++++++++++++++++++++++++++++++++++++++++++++++++++++++  Piqray 150 mg - 1 tablet (day27) 8:30  AM   (03/19/2018)   -   FBG  147Mg %   ->    Nov 70/30  10 u  1:30 PM  (03/19/2018)  -    CBG  193Mg %    ->    Nov 70/30 10 u  6:00 PM   (03/19/2018)  -   CBG  161 Mg%     ->     Nov 70/30 15 u 10:00 PM    (03/19/2018)   -    CBG  143 Mg%  -> Lantus 140u+  _____ 175Units total for  Wed +++++++++++++++++++++++++++++++++++++++++++++++++++++++++++++++++++++++++++++++++++++++++++++++  Piqray 150 mg - 1 tablet (day28) 8:30  AM   (03/20/2018)   -    FBG  155Mg %    ->    Nov 70/30  10 u  1:30 PM  (03/20/2018)  -    CBG  186Mg %    ->     6:00 PM   (03/20/2018)  -   CBG  178 Mg%    ->     Nov 70/30 20 u 10:00 PM    (03/20/2018)   -    CBG  151  Mg%  -> Lantus140u+   _____ 170Units total for Thurs +++++++++++++++++++++++++++++++++++++++++++++++++++++++++++++++++++++++++++++++++++++++++++++++  Piqray 150 mg - 1 tablet (day29) 8:30  AM   (03/21/2018)   -    FBG  133 Mg%   ->      Nov 70/30  10 u  1:30 PM  (03/21/2018)  -    CBG  59 Mg%    ->     6:00 PM   (03/21/2018)  -    CBG  132 Mg%    ->       10:00 PM    (03/21/2018)   -     CBG  146 Mg%  -> Lantus140u+   _____ 150Units total for  Fri +++++++++++++++++++++++++++++++++++++++++++++++++++++++++++++++++++++++++++++++++++++++++++++++  Piqray 150 mg - 1 tablet (day30)  8:30  AM   (03/22/2018)   -  FBG  216 Mg%   ->       Nov 70/30  15u  1:30 PM  (03/22/2018)  -    CBG  81 Mg%    ->     6:00 PM   (03/22/2018)  -    CBG  193 Mg%    ->     Nov 70/30  15 u 10:00 PM    (03/22/2018)   -     CBG  153 Mg%  -> Lantus140u+    _____ 170Units total for Sat +++++++++++++++++++++++++++++++++++++++++++++++++++++++++++++++++++++++++++++++++++++++++++++++  Piqray 150 mg - 1 tablet (day31) 8:30  AM   (03/23/2018)   -    FBG  105Mg %    ->          1:30 PM  (03/23/2018)  -    CBG  113 Mg%     ->      6:00 PM   (03/23/2018)  -    CBG  125  Mg%     ->      10:00 PM    (03/23/2018)   -     CBG  181 Mg%   ->  Lantus140u+ Nov 70/3010 u   _____ 150Units total  forSun +++++++++++++++++++++++++++++++++++++++++++++++++++++++++++++++++++++++++++++++++++++++++++++++  Piqray 150 mg - 1 tablet (day32) 8:30  AM   (812/2019)   -     FBG   145 Mg%    ->       Nov 70/30 10 u   1:30 PM  (03/24/2018)  -    CBG   243 Mg%    ->    Nov 70/3020 u  6:00 PM   (03/24/2018)  -    CBG   163 Mg%    ->     Nov 70/3015 u 10:00 PM    (03/24/2018)   -     CBG    207Mg %  -> Lantus 140u+    _____ 185 Units total for Mon +++++++++++++++++++++++++++++++++++++++++++++++++++++++++++++++++++++++++++++++++++++++++++++++  Piqray 150 mg - 1 tablet (day33)  8:30  AM   (813/2019)   -     FBG     59 Mg%   ->               1:30 PM  (03/25/2018)  -    CBG  119Mg %    ->      6:00 PM   (03/25/2018)  -    CBG  148Mg %    ->     Nov 70/3010 u 10:00 PM    (03/25/2018)   -     CBG  160Mg %  -> Lantus130 u+    _____ 140 Units total for  Tues +++++++++++++++++++++++++++++++++++++++++++++++++++++++++++++++++++++++++++++++++++++++++++++++  Piqray 150 mg - 1 tablet (day34) 8:30  AM     (814/2019)    -    FBG    187Mg %   ->        Nov 70/30 15 u  1:30 PM   (03/26/2018)  -     CBG   76 Mg%    ->      6:00 PM    (03/26/2018)  -     CBG  127Mg %    ->      10:00 PM     (03/26/2018)   -      CBG  218Mg %  -> Lantus135  u+  _____    150Units total for Wed +++++++++++++++++++++++++++++++++++++++++++++++++++++++++++++++++++++++++++++++++++++++++++++++  Piqray 150 mg - 1 tablet (day35)  8:30  AM   (815/2019)   -     FBG    185   Mg%   ->         Nov 70/30    1:30 PM  (03/27/2018)  -    CBG    246  Mg%    ->    Nov 70/3020 u  6:00 PM   (03/27/2018)  -    CBG    278  Mg%    ->    Nov 70/3025 u  10:00 PM    (03/27/2018)   -     CBG    199  Mg%  -> Lantus  140u+Nov 70/30   _____  185Units total for  Thurs ++++++++++++++++++++++++++++++++++++++++++++++++++++++++++++++++++++++++++++++++++++++++++++++++  Piqray 150 mg - 1 tablet (day36) 8:30  AM    (03/28/2018)    -   FBG 196 Mg%    ->           Nov 70/30 15 u 1:30 PM  (03/28/2018)   -     CBG  118Mg %      ->        6:00 PM   (03/28/2018)   -     CBG   95Mg %     ->        10:00 PM    (03/28/2018)     -      CBG  209Mg %    ->    Lantus 140u+    _____   155Units total for Fri ++++++++++++++++++++++++++++++++++++++++++++++++++++++++++++++++++++++++++++++++++++++++++++++++  Piqray 150 mg - 1 tablet (day37) 8:30  AM     (03/29/2018)    -     FBG  238Mg %    ->            Nov 70/30  20 u 1:30 PM  (03/29/2018)    -     CBG  194Mg %    ->        6:00 PM   (03/29/2018)    -     CBG  138 Mg%     ->      10:00 PM    (03/29/2018)      -      CBG  267Mg %  -> Lantus140u +  Nov 70/30 10 u  _____  170 Units total for  Sat +++++++++++++++++++++++++++++++++++++++++++++++++++++++++++++++++++++++++++++++++++++++++++++++                                                           Piqray 150 mg - 1 tablet (day38) 8:30  AM     (03/30/2018)    -    FBG    Mg%   ->        Nov 70/30    4:30 PM  (03/30/2018)    -     CBG   183Mg %    ->    Nov 70/3015 u  6:00 PM   (03/30/2018)    -     CBG   Mg%    ->    Nov 70/30 10:00 PM    (03/30/2018)     -      CBG   Mg%  -> Lantusu+  Nov 70/30  _____  Units total for Sun +++++++++++++++++++++++++++++++++++++++++++++++++++++++++++++++++++++++++++++++++++++++++++++++                                                                 Piqray 150 mg - 1 tablet (day39) 8:30  AM    (03/31/2018)     -    FBG    Mg%     ->        Nov 70/30    1:30 PM  (03/31/2018)    -     CBG   Mg%    ->    Nov 70/30  6:00 PM   (03/31/2018)    -     CBG   Mg%    ->    Nov 70/30 10:00 PM    (03/31/2018)      -      CBG   Mg%  -> Lantusu+ Nov 70/30    _____          Units total for Mon +++++++++++++++++++++++++++++++++++++++++++++++++++++++++++++++++++++++++++++++++++++++++++++++                                                                 Piqray 150 mg - 1 tablet (day40) 8:30  AM    (04/01/2018)     -    FBG    Mg%     ->        Nov 70/30    1:30 PM  (04/01/2018)    -     CBG   Mg%    ->    Nov 70/30  6:00 PM   (04/01/2018)    -     CBG   Mg%    ->    Nov 70/30 10:00 PM    (04/01/2018)      -      CBG   Mg%  -> Lantusu+ Nov 70/30   _____          Units total for Tues +++++++++++++++++++++++++++++++++++++++++++++++++++++++++++++++++++++++++++++++++++++++++++++++                                                                 Piqray 150 mg - 1 tablet (day41) 8:30  AM    (04/02/2018)     -    FBG    Mg%     ->        Nov 70/30    1:30 PM  (04/02/2018)    -     CBG   Mg%    ->    Nov 70/30  6:00 PM   (04/02/2018)    -     CBG   Mg%    ->    Nov 70/30 10:00 PM    (04/02/2018)      -      CBG   Mg%  -> [place]+ Nov  70/30  _____           Units total for Wed +++++++++++++++++++++++++++++++++++++++++++++++++++++++++++++++++++++++++++++++++++++++++++++++                                                                 Piqray 150 mg - 1 tablet (day42) 8:30  AM    (04/03/2018)     -    FBG    Mg%     ->        Nov 70/30    1:30 PM  (04/03/2018)    -     CBG   Mg%    ->    Nov 70/30  6:00 PM   (04/03/2018)    -     CBG   Mg%    ->    Nov 70/30 10:00 PM    (04/03/2018)      -      CBG   Mg%  -> [place]+ Nov 70/30   _____         Units total for Thurs +++++++++++++++++++++++++++++++++++++++++++++++++++++++++++++++++++++++++++++++++++++++++++++++

## 2018-03-31 ENCOUNTER — Encounter (HOSPITAL_COMMUNITY): Payer: Self-pay

## 2018-03-31 ENCOUNTER — Other Ambulatory Visit: Payer: Self-pay | Admitting: Hematology and Oncology

## 2018-03-31 ENCOUNTER — Ambulatory Visit (HOSPITAL_COMMUNITY)
Admission: RE | Admit: 2018-03-31 | Discharge: 2018-03-31 | Disposition: A | Discharge status: Home / Self Care | Payer: Medicare Other | Source: Ambulatory Visit | Attending: Hematology and Oncology | Admitting: Hematology and Oncology

## 2018-03-31 ENCOUNTER — Telehealth: Payer: Self-pay

## 2018-03-31 ENCOUNTER — Other Ambulatory Visit: Payer: Self-pay | Admitting: Internal Medicine

## 2018-03-31 DIAGNOSIS — C7800 Secondary malignant neoplasm of unspecified lung: Secondary | ICD-10-CM

## 2018-03-31 DIAGNOSIS — C7951 Secondary malignant neoplasm of bone: Secondary | ICD-10-CM

## 2018-03-31 DIAGNOSIS — C50911 Malignant neoplasm of unspecified site of right female breast: Secondary | ICD-10-CM | POA: Diagnosis not present

## 2018-03-31 DIAGNOSIS — J90 Pleural effusion, not elsewhere classified: Secondary | ICD-10-CM | POA: Diagnosis not present

## 2018-03-31 DIAGNOSIS — C50511 Malignant neoplasm of lower-outer quadrant of right female breast: Secondary | ICD-10-CM | POA: Diagnosis not present

## 2018-03-31 DIAGNOSIS — Z5111 Encounter for antineoplastic chemotherapy: Secondary | ICD-10-CM | POA: Diagnosis not present

## 2018-03-31 DIAGNOSIS — C787 Secondary malignant neoplasm of liver and intrahepatic bile duct: Secondary | ICD-10-CM | POA: Insufficient documentation

## 2018-03-31 MED ORDER — IOHEXOL 300 MG/ML  SOLN
100.0000 mL | Freq: Once | INTRAMUSCULAR | Status: AC | PRN
  Administered 2018-03-31: 100 mL via INTRAVENOUS

## 2018-03-31 MED FILL — HYDROmorphone HCL 2 MG TABS: 2 | 15 days supply | Qty: 90 | Fill #0

## 2018-03-31 MED FILL — fentaNYL 25 MCG/HR PT72: 25 | 30 days supply | Qty: 10 | Fill #0

## 2018-03-31 MED FILL — PROMETHAZINE 25 MG TABLET: 25 | 7 days supply | Qty: 30 | Fill #0

## 2018-03-31 MED FILL — LORazepam 2 MG TABS: 2 | 30 days supply | Qty: 30 | Fill #1

## 2018-03-31 NOTE — Telephone Encounter (Signed)
Notified pt that her dilaudid request refill is ready for pick up at the [facility]. Pt will be picking it up this afternoon , after her CT scan.

## 2018-03-31 NOTE — Progress Notes (Signed)
(02/08/2018) - FBG 357 mg% ->  Nov 70/30  15 u  6:30 pm (02/08/2018) - CBG 482 mg% ->  Nov 70/30 30 u 11:00 pm (02/08/2018) - CBG 486 mg% ->Lantus 20 u   ____65 u +++++++++++++++++++++++++++++++++++++++++++++++++++++++++++++++++++++++++ 8:30 am (02/09/2018) - FBG 331 mg% ->  Nov 70/30 45 u  6:30 pm (02/09/2018) - CBG 410 mg% ->  Nov 70/30  50 u 11:00pm (02/09/2018) -CBG426 mg% -> Lantus 20 u+ Nov 70/30  20 u  ____ 135 u  ++++++++++++++++++++++++++++++++++++++++++++++++++++++++++++++++++++++++++ 9:00 am (02/11/2018) - FBG 236mg % ->  Nov 70/30 20u 6:30 pm (02/11/2018) - CBG 329mg % ->  Nov 70/30  20u  11:00 pm (02/11/2018) - CBG428 mg% -> ->  Nov 70/30  60 u  ____100 u  ++++++++++++++++++++++++++++++++++++++++++++++++++++++++++++++++++++++++++ 9:30 am (02/12/2018) - FBG  332 mg% -> Lantus 20 u +    Nov 70/30 60 u  4:00 pm (02/12/2018) - CBG  354 mg% -> 7:00 pm (02/12/2018) - CBG 254 mg% ->  Nov 70/30  20 u  11:00 pm (02/12/2018) - CBG199mg %-> Lantus 60 u   ____ 160 u +++++++++++++++++++++++++++++++++++++++++++++++++++++++++++++++++++++++++++ 9:00 am (02/13/2018) - FBG  501mg % ->   Nov 70/30  60u 3:30pm (02/13/2018) -  10:00 pm(02/13/2018) - CBG  556 mg% -> Lantus 80u +  Nov 70/30   60u                                                                ____200 u +++++++++++++++++++++++++++++++++++++++++++++++++++++++++++++++++++++++++++ 8:30 am (02/14/2018) - FBG 377 mg% ->   Nov 70/30  70u 12:00(02/14/2018)- CBG 451 mg%   Nov 70/30  80 u 6:00 pm (02/14/2018) - CBG 452 mg% ->   Nov 70/30  80 u 9:00 pm (02/14/2018) - CBG 428mg % -> Lantus 120 u ____350 u TOTAL for Friday  +++++++++++++++++++++++++++++++++++++++++++++++++++++++++++++++++++++++++++ 8:00 am  (02/15/2018) - FBG 288mg % ->   Nov 70/30  80 u 3:00 pm (02/15/2018) - CBG 436mg % ->  Nov 70/30  100u 7:00 pm (02/15/2018) - CBG 443 mg% ->  Nov 70/30  100 u  10:30 pm (02/15/2018) - CBG 401 mg% -> Lantus 120 u +  Nov 70/30  100 u  ____500 uTOTAL for Saturday +++++++++++++++++++++++++++++++++++++++++++++++++++++++++++++++++++++++++++ NO Piqray 8:00 am (02/16/2018) - FBG 232 mg% ->   Nov 70/30  80 u 3:00 pm (02/16/2018) - CBG 313 mg% ->   Nov 70/30  80 u   7:00 pm (02/16/2018) - CBG 184 mg% ->   Nov 70/30 20u   10:00 pm (02/16/2018) - CBG 243 mg% -> No Lantus   Nov 70/30 40 u  ____ 220 u TOTAL for Sunday ++++++++++++++++++++++++++++++++++++++++++++++++++++++++++++++++++++++++++++ 10:00 am (02/17/2018) - FBG 72 mg% ->  No Insulin  12:00 pm (02/17/2018) - CBG  202 mg% ->   No Insulin   4:00 pm (02/17/2018) - CBG  299 mg% ->at office visit -     Nov 70/30  30 u At 5:30 pm 11:00 PM (02/17/2018) - CBG  221 mg%    Nov 70/30  15 u At 11:15 pm  ____ 45 units TOTAL for Mon  +++++++++++++++++++++++++++++++++++++++++++++++++++++++++++++++++++++++++++++ 8:30Am (02/18/2018) - FBG147 mg% ->  Nov 70/30 15 u  2:00 Pm (02/18/2018) - CBG 215mg % -> Nov 70/30 15 u 7:00 Pm (02/18/2018) - CBG 194 mg% -> Nov 70/30 30  u 10:00 Pm (02/18/2018) - CBG 145 mg% ->Lantus 20 u     ____ 80Units TOTAL for Tues +++++++++++++++++++++++++++++++++++++++++++++++++++++++++++++++++++++++++++++++ 8:00 am (02/19/2018) - FBG230Mg % ->  Nov 70/3035 u 2:00 Pm (02/19/2018) - CBG 98 Mg% ->  No Insulin 7:00 Pm (02/19/2018) - CBG183Mg % ->  Nov 70/3020 u 11:00 Pm (02/19/2018) - CBG139Mg % ->Lantus 25 u   ____  80  Units TOTAL for Wed ++++++++++++++++++++++++++++++++++++++++++++++++++++++++++++++++++++++++++++++++ 8:00 AM(02/20/2018) - FBG122Mg % -> Nov 70/3020u 1:00PM(02/20/2018) - CBG 71Mg % -> No Insulin 6:00 PM (02/20/2018) - CBG103Mg % -> No Insulin 10:00 PM (02/20/2018) - CBG154Mg % ->Lantus 20 u   ____40Units totalfor Thurs ++++++++++++++++++++++++++++++++++++++++++++++++++++++++++++++++++++++++++++++++ Restart Piqray 150 mg - 1 tablet  8:00  AM (02/21/2018) - FBG  176Mg %  -> Nov 70/30  15u  2:30PM(02/21/2018) - CBG 149 Mg% ->     Episode of N/V 2 hr after took [place] &Metformin at 2:30 pm  6:30 PM (02/21/2018) - CBG  261Mg %  ->  Nov 70/30  15u 10:00 PM (02/21/2018) - CBG 236Mg % ->Lantus25 u   ____ 55Units totalfor  Fri +++++++++++++++++++++++++++++++++++++++++++++++++++++++++++++++++++++++++++++++  Piqray 150 mg 1 tablet (day 2)(Metformin on hold ->) 8:00  AM  (02/22/2018)  -  FBG  145Mg % ->  Nov 70/30  10u  2:30 PM(02/22/2018) - CBG 112 Mg% ->      6:30 PM (02/22/2018)  -  CBG 213 Mg% ->  Nov 70/30  10u 10:00 PM  (02/22/2018)  -  CBG 235Mg % ->Lantus 25u   ____  45Units total for Sat ++++++++++++++++++++++++++++++++++++++++++++++++++++++++++++++++++++++++++++++++  Piqray 150 mg - 1 tablet (day3) 8:00  AM  (02/23/2018)  -  FBG  171Mg % ->  Nov 70/30   10u  2:00 PM (02/23/2018) - CBG  207Mg % ->     Nov 70/30  10u  6:00 PM  (02/23/2018)  -  CBG  191Mg % ->   Nov 70/30   10u 10:00 PM  (02/23/2018)  -  CBG  245Mg % ->Lantus35 u  Nov 70/30  10u  _____  75Units total  forSun +++++++++++++++++++++++++++++++++++++++++++++++++++++++++++++++++++++++++++++++++  Piqray 150 mg - 1 tablet (day4) 8:00  AM  (02/24/2018)  -  FBG  148 Mg% ->       2:30 PM (02/24/2018)  -  CBG  274Mg % ->    Nov 70/30   30u  6:00 PM  (02/24/2018)  -  CBG  347Mg % ->   Nov 70/30  30u 10:00 PM  (02/24/2018)  -  CBG  419Mg % ->Lantus60u   Nov 70/30  20u  _____ 140Units total forMon +++++++++++++++++++++++++++++++++++++++++++++++++++++++++++++++++++++++++++++++++  Piqray 150 mg - 1 tablet (day5) 8:00  AM  (02/25/2018)  -  FBG 169 Mg% ->      Nov 70/30 30u  2:30 PM (02/25/2018)  -  CBG  176Mg % ->       Nov 70/30  30u  6:00 PM  (02/25/2018)  -  CBG  251 Mg% ->   Nov 70/30  30u 10:00  PM  (02/25/2018)   -  CBG  286Mg % -> Lantus 80u  Nov 70/30  30 u  _____ 200Units total  forTues +++++++++++++++++++++++++++++++++++++++++++++++++++++++++++++++++++++++++++++++++  Piqray 150 mg - 1 tablet (day6) 8:00  AM  (02/26/2018)  - FBG 93 Mg%  ->   Nov 70/30 25u 2:30 PM  (02/26/2018)  -  CBG  265 Mg% ->      Nov 70/30 40u 6:00 PM   (02/26/2018)  - CBG  313 Mg% ->    Nov 70/30  50u 10:00 PM   (02/26/2018)   -  CBG  389Mg % -> Lantus100 u Nov 70/30 40u _____ 255Units total forWed +++++++++++++++++++++++++++++++++++++++++++++++++++++++++++++++++++++++++++++++++  Piqray 150 mg - 1 tablet (day7)  8:00  AM  (02/27/2018)  -  FBG  293Mg %  ->     Nov 70/30  60 u 1:30 PM  (02/27/2018)  -   CBG   117Mg %  ->     Nov 70/30 40u 5:30  PM   (02/27/2018)  -  CBG    81Mg %  ->     10:00 PM   (02/27/2018)   -   CBG   92Mg %  -> Lantus 20 u   _____  120Units total  forThurs +++++++++++++++++++++++++++++++++++++++++++++++++++++++++++++++++++++++++++++++++  Piqray 150 mg - 1 tablet (day8) 8:00  AM  (02/28/2018)  -  FBG  121Mg %   ->    Nov 70/30 10u 1:30 PM  (02/28/2018)  -   CBG  237Mg %  ->     Nov 70/30 25u 5:30  PM   (02/28/2018)  -  CBG   269 Mg%   ->     Nov 70/30 35u 10:00 PM   (02/28/2018)   -   CBG  267Mg %   ->Lantus50u   Nov 70/30 15u   _____  135Units total forFri +++++++++++++++++++++++++++++++++++++++++++++++++++++++++++++++++++++++++++++++++  Piqray 150 mg - 1 tablet (day9) 8:30  AM  (03/01/2018)  -  FBG  307Mg %   ->   Nov 70/30 40u 1:30 PM  (03/01/2018)  -   CBG  157Mg %   ->   Nov 70/30 20u 5:30  PM   (03/01/2018)  -  CBG   139Mg %   ->   Nov 70/30 25u 10:00 PM   (03/01/2018)   -   CBG  193Mg %  ->Lantus70u  Nov 70/30 10u  _____  165Units total  forSat ++++++++++++++++++++++++++++++++++++++++++++++++++++++  Piqray 150 mg - 1 tablet (day10) 8:30  AM  (03/02/2018)  -   FBG  286Mg %   ->    Nov 70/30 35u 1:30 PM  (03/02/2018)  -   CBG  306 Mg%   ->    Nov 70/30 60u 5:30  PM   (03/02/2018)  -   CBG   359 Mg%   ->    Nov 70/30 60u 10:00 PM   (03/02/2018)   -   CBG   330Mg %  ->Lantus100u+ Nov 70/30 60u   _____  315Units total forSun +++++++++++++++++++++++++++++++++++++++++++++++++++++++++++++++++++++++++++++++++++++++++++++++  Piqray 150 mg - 1 tablet (day11) 8:30  AM  (03/03/2018)  -   FBG  121Mg %  ->     Nov 70/30 20u 1:30 PM  (03/03/2018)  -   CBG   112 Mg%   ->     Nov 70/30 20u 5:30 PM  (03/03/2018)  -   CBG   219Mg %   ->    Nov 70/30 35u 10:00 PM   (03/03/2018)   -   CBG   257Mg %  ->Lantus 120u + Nov 70/30 40u  _____  235Units total  forMon +++++++++++++++++++++++++++++++++++++++++++++++++++++++++++++++++++++++++++++++++++++++++++++++  Piqray 150 mg - 1 tablet (day12) 8:30  AM  (03/04/2018)  -   FBG  112Mg %  ->   Nov 70/30 20u 1:30 PM  (03/04/2018)  -   CBG    97 Mg%   ->     5:30 PM  (03/04/2018)  -   CBG   198Mg %   ->    Nov 70/30 10u 10:00 PM   (03/04/2018)   -   CBG   211Mg %  ->Lantus 110 u +  Nov 70/30 10u  _____  150Units total forTues +++++++++++++++++++++++++++++++++++++++++++++++++++++++++++++++++++++++++++++++++++++++++++++++  Piqray 150 mg - 1 tablet (day13) 8:30  AM  (03/05/2018)  -   FBG   126  Mg%  ->    Nov 70/30  20 u 1:30 PM  (03/05/2018)  -   CBG    82 Mg%   ->     6:00 PM  (03/05/2018)  -   CBG   188 Mg%   ->     Nov 70/30  20 u 10:00 PM    (03/05/2018)   -   CBG   207 Mg%  ->Lantus 110 u + Nov 70/30  10 u  _____ 160 Units total  forWed +++++++++++++++++++++++++++++++++++++++++++++++++++++++++++++++++++++++++++++++++++++++++++++++  Piqray 150 mg - 1 tablet (day14) 8:30  AM   (03/06/2018)  -   FBG 132  Mg%  ->   Nov 70/30  15 u 1:30 PM  (03/06/2018)  -   CBG 229 Mg%   ->    Nov 70/30  20 u 6:00 PM  (03/06/2018)  -   CBG 187Mg %   ->      10:00 PM    (03/06/2018)   -   CBG 151 Mg%  ->Lantus 110u + Nov 70/30  25 u  _____ 170Units total forThurs +++++++++++++++++++++++++++++++++++++++++++++++++++++++++++++++++++++++++++++++++++++++++++++++  Piqray 150 mg - 1 tablet (day15) 8:30  AM   (03/07/2018)  -   FBG  235Mg %   ->    Nov 70/30  25u 1:30 PM  (03/07/2018)  -   CBG  202Mg %   ->    Nov 70/30  25u 6:00 PM  (03/07/2018)  -   CBG  190Mg %   ->    Nov 70/3025u 10:00 PM    (03/07/2018)   -   CBG  185Mg %  ->Lantus 110u  +   _____ 185Units total  forFri +++++++++++++++++++++++++++++++++++++++++++++++++++++++++++++++++++++++++++++++++++++++++++++++  Piqray 150 mg - 1 tablet (day16 8:30  AM   (03/08/2018)  -   FBG  120Mg %   ->    Nov 70/30  15u 1:30 PM  (03/08/2018)  -   CBG   105 Mg%   ->      6:00 PM  (03/08/2018)  -   CBG  103Mg %   ->     10:00 PM    (03/08/2018)   -   CBG  188Mg %  ->Lantus 110u  +   _____  125Units total for Sat +++++++++++++++++++++++++++++++++++++++++++++++++++++++++++++++++++++++++++++++++++++++++++++++  Piqray 150 mg - 1 tablet (day17) 8:30  AM   (03/09/2018)  -   FBG  140 Mg%   ->   Nov 70/30  15 u 1:30 PM  (03/09/2018)  -    CBG   114 Mg%   ->      6:00 PM  (03/09/2018)  -    CBG  219 Mg%   ->     10:00 PM    (03/09/2018)   -    CBG  255 Mg%  ->Lantus120u  +   Nov 70/30 10 u  _____ 145Units total for  Sun +++++++++++++++++++++++++++++++++++++++++++++++++++++++++++++++++++++++++++++++++++++++++++++++  Piqray 150 mg - 1 tablet (day18) 8:30  AM   (03/10/2018)  -   FBG  224Mg %   ->    Nov 70/30 25 u 1:30 PM  (03/10/2018)  -    CBG   87 Mg%   ->       6:00 PM   (03/10/2018)  -   CBG  79 Mg%   ->  10:00 PM    (03/10/2018)   -   CBG  197Mg %  ->Lantus120u  + Nov 70/30 20 u  _____ 165Units total for Mon +++++++++++++++++++++++++++++++++++++++++++++++++++++++++++++++++++++++++++++++++++++++++++++++  Piqray 150 mg - 1 tablet (day19) 8:30  AM   (03/11/2018)  -   FBG   192 Mg%   ->      Nov 70/30  15 u 1:30 PM  (03/11/2018)  -    CBG   82 Mg%   ->     0   6:00 PM   (03/11/2018)  -   CBG  220Mg %   ->     Nov 70/3020 u 10:00 PM    (03/11/2018)   -   CBG  232Mg %  ->Lantus120u  +  Nov 70/30 10 u   _____ 165Units total for  Tues +++++++++++++++++++++++++++++++++++++++++++++++++++++++++++++++++++++++++++++++++++++++++++++++  Piqray 150 mg - 1 tablet (day20) 8:30  AM   (03/12/2018)  -    FBG   231 Mg%   ->      Nov 70/30  20 u 1:30 PM  (03/12/2018)  -    CBG  184 Mg%    ->     Nov 70/30  25 u 6:00 PM   (03/12/2018)  -   CBG   88Mg %    ->     0  10:00 PM    (03/12/2018)   -   CBG  133Mg %   ->Lantus 110u +    _____ 155Units total for Wed +++++++++++++++++++++++++++++++++++++++++++++++++++++++++++++++++++++++++++++++++++++++++++++++  Piqray 150 mg - 1 tablet (day21) 8:30  AM   (03/13/2018)   -   FBG   179Mg %   ->      Nov 70/30  10 u 1:30 PM  (03/13/2018)  -    CBG  89 Mg%    ->      6:00 PM   (03/13/2018)  -   CBG   144Mg %    ->     10:00 PM    (03/13/2018)   -    CBG  123Mg %   -> Lantus110u +  _____ 120Units total for  Thurs +++++++++++++++++++++++++++++++++++++++++++++++++++++++++++++++++++++++++++++++++++++++++++++++  Piqray 150 mg - 1 tablet (day22) 8:30  AM   (03/14/2018)   -   FBG   196Mg %   ->    Nov 70/30  15 u 1:30 PM  (03/14/2018)  -    CBG  139Mg %    ->    Nov 70/30  10 u 6:00 PM   (03/14/2018)  -   CBG   211Mg %    ->     Nov 70/30 15 u 10:00 PM    (03/14/2018)   -    CBG  230Mg %   ->Lantus120u + Nov 70/30  10 u   _____ 170Units total for Fri +++++++++++++++++++++++++++++++++++++++++++++++++++++++++++++++++++++++++++++++++++++++++++++++  Piqray 150 mg - 1 tablet (day23) 8:30  AM   (03/15/2018)   -   FBG   152Mg %   ->   Nov 70/30  15 u 1:30 PM  (03/15/2018)  -    CBG   79 Mg%    ->       6:00 PM   (03/15/2018)  -   CBG   180Mg %    ->     Nov 70/30 10 u 10:00 PM    (03/15/2018)   -    CBG  193 Mg%  -> Lantus 120u +  _____ 145Units total for  Sat +++++++++++++++++++++++++++++++++++++++++++++++++++++++++++++++++++++++++++++++++++++++++++++++  Piqray 150 mg - 1 tablet (day24) 8:30  AM   (03/16/2018)   -   FBG  150Mg %   ->   Nov 70/30   10 u 1:30 PM  (03/16/2018)  -    CBG  139Mg %    ->      6:00 PM   (03/16/2018)  -   CBG  189 Mg%    ->     Nov 70/30 10 u 10:00 PM    (03/16/2018)   -    CBG    Mg%  -> Lantus110 u+  _____ 120Units total for Sun +++++++++++++++++++++++++++++++++++++++++++++++++++++++++++++++++++++++++++++++++++++++++++++++  Piqray 150 mg - 1 tablet (day25) 8:30  AM   (03/17/2018)   -   FBG  174 Mg%  ->    Nov 70/30  10u 1:30 PM  (03/17/2018)  -    CBG  168Mg %    ->    Nov 70/30 15 u 6:00 PM   (03/17/2018)  -   CBG  212 Mg%    ->     Nov 70/30 20 u 10:00 PM    (03/17/2018)   -    CBG  252Mg %  -> Lantus120u+Nov 70/3020 u _____ 185Units total for  Mon +++++++++++++++++++++++++++++++++++++++++++++++++++++++++++++++++++++++++++++++++++++++++++++++  Piqray 150 mg - 1 tablet (day26) 8:30  AM   (03/18/2018)   -   FBG   243 Mg%  ->boost Lantus 20 u+ Nov 70/30 30 u 1:30 PM  (03/18/2018)  -    CBG   155Mg %   ->     Nov 70/30 15 u  6:00 PM   (03/18/2018)  -   CBG  227 Mg%   ->     Nov 70/30 20 u 10:00 PM    (03/18/2018)   -    CBG  208Mg %  -> Lantus140u+ Nov 70/3015 u  _____ 240Units total for Tues +++++++++++++++++++++++++++++++++++++++++++++++++++++++++++++++++++++++++++++++++++++++++++++++  Piqray 150 mg - 1 tablet (day27) 8:30  AM   (03/19/2018)   -   FBG  147Mg %   ->    Nov 70/30  10 u  1:30 PM  (03/19/2018)  -    CBG  193Mg %    ->    Nov 70/30 10 u  6:00 PM   (03/19/2018)  -   CBG  161 Mg%     ->     Nov 70/30 15 u 10:00 PM    (03/19/2018)   -    CBG  143 Mg%  -> Lantus 140u+  _____ 175Units total for  Wed +++++++++++++++++++++++++++++++++++++++++++++++++++++++++++++++++++++++++++++++++++++++++++++++  Piqray 150 mg - 1 tablet (day28) 8:30  AM   (03/20/2018)   -    FBG  155Mg %    ->    Nov 70/30  10 u  1:30 PM  (03/20/2018)  -    CBG  186Mg %    ->     6:00 PM   (03/20/2018)  -   CBG  178 Mg%    ->     Nov 70/30 20 u 10:00 PM    (03/20/2018)   -    CBG  151  Mg%  -> Lantus140u+   _____ 170Units total for Thurs +++++++++++++++++++++++++++++++++++++++++++++++++++++++++++++++++++++++++++++++++++++++++++++++  Piqray 150 mg - 1 tablet (day29) 8:30  AM   (03/21/2018)   -    FBG  133 Mg%   ->      Nov 70/30  10 u  1:30 PM  (03/21/2018)  -    CBG  59 Mg%    ->     6:00 PM   (03/21/2018)  -  CBG  132 Mg%    ->       10:00 PM    (03/21/2018)   -     CBG  146 Mg%  -> Lantus140u+   _____ 150Units total for  Fri +++++++++++++++++++++++++++++++++++++++++++++++++++++++++++++++++++++++++++++++++++++++++++++++  Piqray 150 mg - 1 tablet (day30)  8:30  AM   (03/22/2018)   -    FBG  216 Mg%   ->       Nov 70/30  15u  1:30 PM  (03/22/2018)  -    CBG  81 Mg%    ->     6:00 PM   (03/22/2018)  -    CBG  193 Mg%    ->     Nov 70/30  15 u 10:00 PM    (03/22/2018)   -     CBG  153 Mg%  -> Lantus140u+    _____ 170Units total for Sat +++++++++++++++++++++++++++++++++++++++++++++++++++++++++++++++++++++++++++++++++++++++++++++++  Piqray 150 mg - 1 tablet (day31) 8:30  AM   (03/23/2018)   -    FBG  105Mg %    ->          1:30 PM  (03/23/2018)  -    CBG  113 Mg%     ->      6:00 PM   (03/23/2018)  -    CBG  125  Mg%     ->      10:00 PM    (03/23/2018)   -     CBG  181 Mg%   ->  Lantus140u+ Nov 70/3010 u   _____ 150Units total  forSun +++++++++++++++++++++++++++++++++++++++++++++++++++++++++++++++++++++++++++++++++++++++++++++++  Piqray 150 mg - 1 tablet (day32) 8:30  AM   (812/2019)   -     FBG   145 Mg%    ->       Nov 70/30 10 u   1:30 PM  (03/24/2018)  -    CBG   243 Mg%    ->    Nov 70/3020 u  6:00 PM   (03/24/2018)  -    CBG   163 Mg%    ->     Nov 70/3015 u 10:00 PM    (03/24/2018)   -     CBG    207Mg %  -> Lantus 140u+    _____ 185 Units total for Mon +++++++++++++++++++++++++++++++++++++++++++++++++++++++++++++++++++++++++++++++++++++++++++++++  Piqray 150 mg - 1 tablet (day33)  8:30  AM   (813/2019)   -     FBG     59 Mg%   ->               1:30 PM  (03/25/2018)  -    CBG  119Mg %    ->      6:00 PM   (03/25/2018)  -    CBG  148Mg %    ->     Nov 70/3010 u 10:00 PM    (03/25/2018)   -     CBG  160Mg %  -> Lantus130 u+    _____ 140 Units total for  Tues +++++++++++++++++++++++++++++++++++++++++++++++++++++++++++++++++++++++++++++++++++++++++++++++  Piqray 150 mg - 1 tablet (day34) 8:30  AM     (814/2019)    -    FBG    187Mg %   ->        Nov 70/30 15 u  1:30 PM   (03/26/2018)  -     CBG   76 Mg%    ->      6:00 PM    (03/26/2018)  -  CBG  127Mg %    ->      10:00 PM     (03/26/2018)   -      CBG  218Mg %  -> Lantus135  u+  _____    150Units total for Wed +++++++++++++++++++++++++++++++++++++++++++++++++++++++++++++++++++++++++++++++++++++++++++++++                                                    Piqray 150 mg - 1 tablet (day35)  8:30  AM   (815/2019)   -     FBG    185   Mg%   ->         Nov 70/30    1:30 PM  (03/27/2018)  -    CBG    246  Mg%    ->    Nov 70/3020 u  6:00 PM   (03/27/2018)  -    CBG    278  Mg%    ->    Nov 70/3025 u  10:00 PM    (03/27/2018)   -     CBG    199  Mg%  -> Lantus  140u+Nov 70/30   _____  185Units total for  Thurs ++++++++++++++++++++++++++++++++++++++++++++++++++++++++++++++++++++++++++++++++++++++++++++++++  Piqray 150 mg - 1 tablet (day36) 8:30  AM    (03/28/2018)    -   FBG 196 Mg%    ->           Nov 70/30 15 u 1:30 PM  (03/28/2018)   -     CBG  118Mg %      ->        6:00 PM   (03/28/2018)   -     CBG   95Mg %     ->        10:00 PM    (03/28/2018)     -      CBG  209Mg %    ->    Lantus 140u+    _____   155Units total for Fri ++++++++++++++++++++++++++++++++++++++++++++++++++++++++++++++++++++++++++++++++++++++++++++++++  Piqray 150 mg - 1 tablet (day37) 8:30  AM     (03/29/2018)    -     FBG  238Mg %    ->            Nov 70/30  20 u 1:30 PM  (03/29/2018)    -     CBG  194Mg %    ->        6:00 PM   (03/29/2018)    -     CBG  138 Mg%     ->      10:00 PM    (03/29/2018)      -      CBG  267Mg %  -> Lantus140u +  Nov 70/30 10 u  _____  170 Units total for  Sat ++++++++++++++++++++++++++++++++++++++++++++++++++++++++++++++++++++++++++++++++++++++++++++++++                                                           Piqray 150 mg - 1 tablet (day38) 8:30  AM     (03/30/2018)    -     FBG  119 Mg%   ->  1:30 PM  (03/30/2018)    -     CBG  ---- Mg%    ->      6:00 PM   (03/30/2018)    -     CBG  183 Mg%    ->     Nov 70/3015 u  10:00 PM    (03/30/2018)     -      CBG   234Mg %  -> Lantus140  u+  Nov 70/30  5 u   _____  160    Units total for Sun ++++++++++++++++++++++++++++++++++++++++++++++++++++++++++++++++++++++++++++++++++++++++++++++++                                                        Piqray 150 mg - 1 tablet (day39) 8:30 AM (03/31/2018) - FBG 325  Mg%  ->Nov 70/30 30 u u12:00PM(03/31/2018) - CBG 260Mg % ->   Nov 70/30    10 u    2:00     PM   (04/01/2018)    -     CBG  144Mg %    ->    6:00PM(03/31/2018) - CBG 67 Mg% -> 10:00 PM (03/31/2018) - CBG 148   Mg% ->Lantus70u+   _____ Units total forMon +++++++++++++++++++++++++++++++++++++++++++++++++++++++++++++++++++++++++++++++++++++++++++++++                                                                 Piqray 150 mg - 1 tablet (day40) 8:30  AM    (04/01/2018)     -    FBG         Mg%     ->        Nov 70/30     1:00   PM  (04/01/2018)    -     CBG         Mg%    ->    Nov 70/30  2:00     PM   (04/01/2018)    -     CBG   Mg%    ->    Nov 70/30 10:00 PM    (04/01/2018)      -      CBG   Mg%  -> Lantusu+ Nov 70/30  _____            Units total for Tues +++++++++++++++++++++++++++++++++++++++++++++++++++++++++++++++++++++++++++++++++++++++++++++++++                                                                  Piqray 150 mg - 1 tablet (day41) 8:30  AM    (04/02/2018)     -    FBG           Mg%     ->         Nov 70/30   1:30 PM  (04/02/2018)    -     CBG        Mg%    ->  Nov 70/30     6:00 PM   (04/02/2018)    -     CBG   Mg%    ->    Nov 70/30 10:00 PM    (04/02/2018)      -      CBG    Mg%  -> Lantusu+ Nov 70/30   _____            Units total for  Wed +++++++++++++++++++++++++++++++++++++++++++++++++++++++++++++++++++++++++++++++++++++++++++++++++                                                                  Piqray 150 mg - 1 tablet (day42) 8:30  AM    (04/03/2018)     -    FBG          Mg%     ->        Nov 70/30   1:30 PM  (04/03/2018)    -     CBG        Mg%    ->    Nov 70/30  6:00 PM   (04/03/2018)    -     CBG   Mg%    ->    Nov 70/30 10:00 PM    (04/03/2018)      -      CBG   Mg%  -> Lantusu+ Nov 70/30   _____            Units total for Thurs +++++++++++++++++++++++++++++++++++++++++++++++++++++++++++++++++++++++++++++++++++++++++++++++++

## 2018-04-02 ENCOUNTER — Other Ambulatory Visit: Payer: Self-pay | Admitting: Internal Medicine

## 2018-04-02 NOTE — Progress Notes (Signed)
(02/08/2018) - FBG 357 mg% ->  Nov 70/30  15 u   6:30  pm (02/08/2018) - CBG 482 mg% ->  Nov 70/30 30 u 11:00 pm (02/08/2018) - CBG 486 mg% ->Lantus 20 u   ____65 u +++++++++++++++++++++++++++++++++++++++++++++++++++++++++++++++++++++++++ 8:30 am (02/09/2018) - FBG 331 mg% ->  Nov 70/30 45 u  6:30 pm (02/09/2018) - CBG 410 mg% ->  Nov 70/30  50 u 11:00pm (02/09/2018) -CBG426 mg% -> Lantus 20 u+ Nov 70/30  20 u  ____ 135 u  ++++++++++++++++++++++++++++++++++++++++++++++++++++++++++++++++++++++++++ 9:00 am (02/11/2018) - FBG 236mg % ->  Nov 70/30 20u 6:30 pm (02/11/2018) - CBG 329mg % ->  Nov 70/30  20u  11:00 pm (02/11/2018) - CBG428 mg% -> ->  Nov 70/30  60 u  ____100 u  ++++++++++++++++++++++++++++++++++++++++++++++++++++++++++++++++++++++++++ 9:30 am (02/12/2018) - FBG  332 mg% -> Lantus 20 u +    Nov 70/30 60 u  4:00 pm (02/12/2018) - CBG  354 mg% -> 7:00 pm (02/12/2018) - CBG 254 mg% ->  Nov 70/30  20 u  11:00 pm (02/12/2018) - CBG199mg %-> Lantus 60 u   ____ 160 u +++++++++++++++++++++++++++++++++++++++++++++++++++++++++++++++++++++++++++ 9:00 am (02/13/2018) - FBG  501mg % ->   Nov 70/30  60u 3:30pm (02/13/2018) -  10:00 pm(02/13/2018) - CBG  556 mg% -> Lantus 80u +  Nov 70/30   60u                                                                ____200 u +++++++++++++++++++++++++++++++++++++++++++++++++++++++++++++++++++++++++++ 8:30 am (02/14/2018) - FBG 377 mg% ->   Nov 70/30  70u 12:00(02/14/2018)- CBG 451 mg%   Nov 70/30  80 u 6:00 pm (02/14/2018) - CBG 452 mg% ->   Nov 70/30  80 u 9:00 pm (02/14/2018) - CBG 428mg % -> Lantus 120 u ____350 u TOTAL for Friday  +++++++++++++++++++++++++++++++++++++++++++++++++++++++++++++++++++++++++++ 8:00 am  (02/15/2018) - FBG 288mg % ->   Nov 70/30  80 u 3:00 pm (02/15/2018) - CBG 436mg % ->  Nov 70/30  100u 7:00 pm (02/15/2018) - CBG 443 mg% ->  Nov 70/30  100 u  10:30 pm (02/15/2018) - CBG 401 mg% -> Lantus 120 u +  Nov 70/30  100 u  ____500 uTOTAL for Saturday +++++++++++++++++++++++++++++++++++++++++++++++++++++++++++++++++++++++++++ NO Piqray 8:00 am (02/16/2018) - FBG 232 mg% ->   Nov 70/30  80 u 3:00 pm (02/16/2018) - CBG 313 mg% ->   Nov 70/30  80 u   7:00 pm (02/16/2018) - CBG 184 mg% ->   Nov 70/30 20u   10:00 pm (02/16/2018) - CBG 243 mg% -> No Lantus   Nov 70/30 40 u ____ 220 u TOTAL for Sunday ++++++++++++++++++++++++++++++++++++++++++++++++++++++++++++++++++++++++++++ 10:00 am (02/17/2018) - FBG 72 mg% ->  No Insulin  12:00 pm (02/17/2018) - CBG  202 mg% ->   No Insulin   4:00 pm (02/17/2018) - CBG  299 mg% ->at office visit -    Nov 70/30   30 u At 5:30 pm 11:00 PM (02/17/2018) - CBG  221 mg%    Nov 70/30  15 u At 11:15 pm   ____ 45 units TOTAL for Mon  +++++++++++++++++++++++++++++++++++++++++++++++++++++++++++++++++++++++++++++ 8:30Am (02/18/2018) - FBG147 mg% ->  Nov 70/30 15 u  2:00 Pm (02/18/2018) - CBG 215mg % -> Nov 70/30 15 u 7:00 Pm (02/18/2018) - CBG 194 mg% -> Nov  70/30 30 u 10:00 Pm (02/18/2018) - CBG 145 mg% ->Lantus 20 u   ____ 80Units TOTAL for Tues +++++++++++++++++++++++++++++++++++++++++++++++++++++++++++++++++++++++++++++++ 8:00 am (02/19/2018) - FBG230Mg % ->  Nov 70/3035 u 2:00 Pm (02/19/2018) - CBG 98 Mg% ->  No Insulin 7:00 Pm (02/19/2018) - CBG183Mg % ->  Nov 70/3020 u 11:00 Pm (02/19/2018) - CBG139Mg % ->Lantus 25 u ____  80 Units TOTAL for  Wed ++++++++++++++++++++++++++++++++++++++++++++++++++++++++++++++++++++++++++++++++ 8:00 AM(02/20/2018) - FBG122Mg % -> Nov 70/3020u 1:00PM(02/20/2018) - CBG 71Mg % -> No Insulin 6:00 PM (02/20/2018) - CBG103Mg % -> No Insulin 10:00 PM (02/20/2018) - CBG154Mg % ->Lantus 20 u  ____40Units totalfor Thurs ++++++++++++++++++++++++++++++++++++++++++++++++++++++++++++++++++++++++++++++++ Restart Piqray 150 mg - 1 tablet  8:00  AM (02/21/2018) - FBG  176Mg %  -> Nov 70/30  15u  2:30PM(02/21/2018) - CBG 149 Mg% ->     Episode of N/V 2 hr after took N[place] &Metformin at 2:30 pm  6:30 PM (02/21/2018) - CBG  261Mg %  ->  Nov 70/30  15u 10:00 PM (02/21/2018) - CBG 236Mg % ->Lantus25 u   ____ 55Units totalfor Fri +++++++++++++++++++++++++++++++++++++++++++++++++++++++++++++++++++++++++++++++  Piqray 150 mg 1 tablet  (day 2)(Metformin on hold ->) 8:00  AM  (02/22/2018)  -  FBG  145Mg % ->  Nov 70/30  10u  2:30 PM(02/22/2018) - CBG 112 Mg% ->      6:30 PM (02/22/2018)  -  CBG 213 Mg% ->  Nov 70/30  10u 10:00 PM  (02/22/2018)  -  CBG 235Mg % ->Lantus 25u   ____  45Units total for Sat ++++++++++++++++++++++++++++++++++++++++++++++++++++++++++++++++++++++++++++++++  Piqray 150 mg - 1 tablet (day3) 8:00  AM  (02/23/2018)  -  FBG  171Mg % ->  Nov 70/30   10u  2:00 PM (02/23/2018) - CBG  207Mg % ->     Nov 70/30  10u  6:00 PM  (02/23/2018)  -  CBG  191Mg % ->   Nov 70/30   10u 10:00 PM  (02/23/2018)  -  CBG  245Mg % ->Lantus35 u  Nov 70/30  10u _____  75Units total forSun +++++++++++++++++++++++++++++++++++++++++++++++++++++++++++++++++++++++++++++++++  Piqray 150 mg - 1 tablet (day4) 8:00  AM  (02/24/2018)  -  FBG   148 Mg% ->       2:30 PM (02/24/2018)  -  CBG  274Mg % ->    Nov 70/30   30u  6:00 PM  (02/24/2018)  -  CBG  347Mg % ->   Nov 70/30  30u 10:00 PM  (02/24/2018)  -  CBG  419Mg % ->Lantus60u   Nov 70/30  20u _____ 140Units total forMon +++++++++++++++++++++++++++++++++++++++++++++++++++++++++++++++++++++++++++++++++  Piqray 150 mg - 1 tablet (day5) 8:00  AM  (02/25/2018)  -  FBG 169 Mg% ->      Nov 70/30 30u  2:30 PM (02/25/2018)  -  CBG  176Mg % ->       Nov 70/30  30u  6:00 PM  (02/25/2018)  -  CBG  251 Mg% ->   Nov 70/30  30u 10:00  PM  (02/25/2018)   -  CBG  286Mg % -> Lantus 80u  Nov 70/30  30 u  _____ 200Units total forTues +++++++++++++++++++++++++++++++++++++++++++++++++++++++++++++++++++++++++++++++++  Piqray 150 mg - 1 tablet (day6) 8:00  AM  (02/26/2018)  - FBG 93 Mg%  ->   Nov 70/30  25u 2:30 PM  (02/26/2018)  -  CBG  265 Mg% ->      Nov 70/30 40u 6:00 PM   (02/26/2018)  - CBG  313 Mg% ->    Nov 70/30  50u 10:00 PM   (02/26/2018)   -   CBG  389Mg % ->  Lantus100 u Nov 70/30 40u _____ 255Units total forWed +++++++++++++++++++++++++++++++++++++++++++++++++++++++++++++++++++++++++++++++++  Piqray 150 mg - 1 tablet (day7)  8:00  AM  (02/27/2018)  -  FBG  293Mg %  ->     Nov 70/30  60 u 1:30 PM  (02/27/2018)  -   CBG   117Mg %  ->     Nov 70/30 40u 5:30  PM   (02/27/2018)  -  CBG    81Mg %  ->     10:00 PM   (02/27/2018)   -   CBG   92Mg %  -> Lantus 20 u   _____  120Units total forThurs +++++++++++++++++++++++++++++++++++++++++++++++++++++++++++++++++++++++++++++++++  Piqray 150 mg - 1 tablet (day8) 8:00  AM  (02/28/2018)  -  FBG  121Mg %   ->    Nov 70/30 10u 1:30 PM  (02/28/2018)  -   CBG  237Mg %  ->      Nov 70/30 25u 5:30  PM   (02/28/2018)  -  CBG   269 Mg%   ->     Nov 70/30 35u 10:00 PM   (02/28/2018)   -   CBG  267Mg %   ->Lantus50u   Nov 70/30 15u   _____  135Units total forFri +++++++++++++++++++++++++++++++++++++++++++++++++++++++++++++++++++++++++++++++++  Piqray 150 mg - 1 tablet (day9) 8:30  AM  (03/01/2018)  -  FBG  307Mg %   ->   Nov 70/30 40u 1:30 PM  (03/01/2018)  -   CBG  157Mg %   ->   Nov 70/30 20u 5:30  PM   (03/01/2018)  -  CBG   139Mg %   ->   Nov 70/30 25u 10:00 PM   (03/01/2018)   -   CBG  193Mg %  ->Lantus70u  Nov 70/30 10u  _____  165Units total forSat ++++++++++++++++++++++++++++++++++++++++++++++++++++++  Piqray 150 mg - 1 tablet (day10) 8:30  AM  (03/02/2018)  -   FBG  286Mg %   ->    Nov 70/30 35u 1:30 PM  (03/02/2018)  -   CBG  306 Mg%   ->    Nov 70/30 60u 5:30  PM   (03/02/2018)  -   CBG    359 Mg%   ->    Nov 70/30 60u 10:00 PM   (03/02/2018)   -   CBG   330Mg %  ->Lantus100u+ Nov 70/30 60u   _____  315Units total forSun +++++++++++++++++++++++++++++++++++++++++++++++++++++++++++++++++++++++++++++++++++++++++++++++  Piqray 150 mg - 1 tablet (day11) 8:30  AM  (03/03/2018)  -   FBG  121Mg %  ->     Nov 70/30 20u 1:30 PM  (03/03/2018)  -   CBG   112 Mg%   ->     Nov 70/30 20u 5:30 PM  (03/03/2018)  -   CBG   219Mg %   ->    Nov 70/30 35u 10:00 PM   (03/03/2018)   -   CBG   257Mg %  ->Lantus 120u + Nov 70/30 40u  _____  235Units total forMon +++++++++++++++++++++++++++++++++++++++++++++++++++++++++++++++++++++++++++++++++++++++++++++++  Piqray 150 mg - 1 tablet (day12) 8:30  AM  (03/04/2018)  -   FBG  112Mg %  ->   Nov 70/30 20u 1:30 PM  (03/04/2018)  -   CBG    97 Mg%   ->     5:30 PM  (03/04/2018)  -   CBG   198Mg %    ->    Nov 70/30 10u 10:00 PM   (03/04/2018)   -   CBG   211Mg %  ->Lantus 110 u + Nov 70/30 10u  _____  150Units total forTues +++++++++++++++++++++++++++++++++++++++++++++++++++++++++++++++++++++++++++++++++++++++++++++++  Piqray 150 mg - 1 tablet (day13) 8:30  AM  (03/05/2018)  -   FBG   126  Mg%  ->    Nov 70/30  20 u 1:30 PM  (03/05/2018)  -   CBG    82 Mg%   ->     6:00 PM  (03/05/2018)  -   CBG   188 Mg%   ->     Nov 70/30  20 u 10:00 PM    (03/05/2018)   -   CBG   207 Mg%  ->Lantus 110 u + Nov 70/30  10 u  _____ 160 Units total forWed +++++++++++++++++++++++++++++++++++++++++++++++++++++++++++++++++++++++++++++++++++++++++++++++  Piqray 150 mg - 1 tablet (day14) 8:30  AM   (03/06/2018)  -   FBG 132  Mg%  ->   Nov 70/30  15u 1:30 PM  (03/06/2018)  -   CBG 229 Mg%   ->    Nov 70/30  20u 6:00 PM  (03/06/2018)  -   CBG 187Mg %   ->       10:00 PM    (03/06/2018)   -   CBG 151 Mg%  ->Lantus 110u + Nov 70/30  25u  _____ 170Units total forThurs +++++++++++++++++++++++++++++++++++++++++++++++++++++++++++++++++++++++++++++++++++++++++++++++  Piqray 150 mg - 1 tablet (day15) 8:30  AM   (03/07/2018)  -   FBG  235Mg %   ->    Nov 70/30  25u 1:30 PM  (03/07/2018)  -   CBG  202Mg %   ->    Nov 70/30  25u 6:00 PM  (03/07/2018)  -   CBG  190Mg %   ->    Nov 70/3025u 10:00 PM    (03/07/2018)   -   CBG  185Mg %  ->Lantus 110u  +   _____ 185Units total forFri +++++++++++++++++++++++++++++++++++++++++++++++++++++++++++++++++++++++++++++++++++++++++++++++  Piqray 150 mg - 1 tablet (day16 8:30  AM   (03/08/2018)  -   FBG  120Mg %   ->    Nov 70/30  15u 1:30 PM  (03/08/2018)  -   CBG   105 Mg%   ->      6:00 PM  (03/08/2018)  -   CBG  103Mg %   ->      10:00 PM    (03/08/2018)   -   CBG  188Mg %  ->Lantus 110u  +   _____  125Units total for Sat +++++++++++++++++++++++++++++++++++++++++++++++++++++++++++++++++++++++++++++++++++++++++++++++  Piqray 150 mg - 1 tablet (day17) 8:30  AM   (03/09/2018)  -   FBG  140 Mg%   ->   Nov 70/30  15 u 1:30 PM  (03/09/2018)  -    CBG   114 Mg%   ->      6:00 PM  (03/09/2018)  -    CBG  219 Mg%   ->     10:00 PM    (03/09/2018)   -    CBG  255 Mg%  ->Lantus120u  +   Nov 70/30 10 u  _____ 145Units total for Sun +++++++++++++++++++++++++++++++++++++++++++++++++++++++++++++++++++++++++++++++++++++++++++++++  Piqray 150 mg - 1 tablet (day18) 8:30  AM   (03/10/2018)  -   FBG  224Mg %   ->    Nov 70/30 25 u 1:30 PM  (03/10/2018)  -    CBG   87 Mg%   ->       6:00 PM   (03/10/2018)  -   CBG  79 Mg%   ->      10:00 PM     (  03/10/2018)   -   CBG  197Mg %  ->Lantus120u  + Nov 70/30 20 u  _____ 165Units total for Mon +++++++++++++++++++++++++++++++++++++++++++++++++++++++++++++++++++++++++++++++++++++++++++++++  Piqray 150 mg - 1 tablet (day19) 8:30  AM   (03/11/2018)  -   FBG   192 Mg%   ->      Nov 70/30 15 u 1:30 PM  (03/11/2018)  -    CBG   82 Mg%   ->     0   6:00 PM   (03/11/2018)  -   CBG  220Mg %   ->     Nov 70/30 20 u 10:00 PM    (03/11/2018)   -   CBG  232Mg %  ->Lantus120u  +  Nov 70/30 10 u   _____ 165Units total for Tues +++++++++++++++++++++++++++++++++++++++++++++++++++++++++++++++++++++++++++++++++++++++++++++++  Piqray 150 mg - 1 tablet (day20) 8:30  AM   (03/12/2018)  -    FBG   231 Mg%   ->      Nov 70/30  20 u 1:30 PM  (03/12/2018)  -    CBG  184 Mg%    ->     Nov 70/30  25 u 6:00 PM   (03/12/2018)  -   CBG   88Mg %    ->     0   10:00 PM    (03/12/2018)   -   CBG  133Mg %   ->Lantus 110u +    _____ 155Units total for Wed +++++++++++++++++++++++++++++++++++++++++++++++++++++++++++++++++++++++++++++++++++++++++++++++  Piqray 150 mg - 1 tablet (day21) 8:30  AM   (03/13/2018)   -   FBG   179Mg %   ->      Nov 70/30  10 u 1:30 PM  (03/13/2018)  -    CBG  89 Mg%    ->      6:00 PM   (03/13/2018)  -   CBG   144Mg %    ->     10:00 PM    (03/13/2018)   -    CBG  123Mg %   -> Lantus110u +  _____ 120Units total for Thurs +++++++++++++++++++++++++++++++++++++++++++++++++++++++++++++++++++++++++++++++++++++++++++++++  Piqray 150 mg - 1 tablet (day22) 8:30  AM   (03/14/2018)   -   FBG   196Mg %   ->    Nov 70/30  15 u 1:30 PM  (03/14/2018)  -    CBG  139Mg %    ->    Nov 70/30  10 u 6:00 PM   (03/14/2018)  -   CBG   211Mg %    ->     Nov 70/30 15 u 10:00 PM    (03/14/2018)    -    CBG  230Mg %   ->Lantus120u + Nov 70/30  10 u   _____ 170Units total for Fri +++++++++++++++++++++++++++++++++++++++++++++++++++++++++++++++++++++++++++++++++++++++++++++++  Piqray 150 mg - 1 tablet (day23) 8:30  AM   (03/15/2018)   -   FBG   152Mg %   ->   Nov 70/30  15 u 1:30 PM  (03/15/2018)  -    CBG   79 Mg%    ->       6:00 PM   (03/15/2018)  -   CBG   180Mg %    ->     Nov 70/30 10 u 10:00 PM    (03/15/2018)   -    CBG  193 Mg%  -> Lantus 120u +  _____ 145Units total for Sat +++++++++++++++++++++++++++++++++++++++++++++++++++++++++++++++++++++++++++++++++++++++++++++++  Piqray 150 mg - 1 tablet (day24) 8:30  AM   (03/16/2018)   -  FBG  150Mg %   ->   Nov 70/30   10 u 1:30 PM  (03/16/2018)  -    CBG  139Mg %    ->      6:00 PM   (03/16/2018)  -   CBG  189 Mg%    ->     Nov 70/30 10 u 10:00 PM    (03/16/2018)   -    CBG     Mg%  -> Lantus110 u+  _____ 120Units total for Sun +++++++++++++++++++++++++++++++++++++++++++++++++++++++++++++++++++++++++++++++++++++++++++++++ Piqray 150 mg - 1 tablet (day25) 8:30 AM (03/17/2018) - FBG 174Mg % ->Nov 70/30 10u 1:30PM(03/17/2018) - CBG 168Mg % ->Nov 70/3015 u 6:00PM(03/17/2018) - CBG 212Mg % ->Nov 70/3020 u 10:00 PM (03/17/2018) - CBG 252Mg % ->Lantus120u+Nov 70/3020 u_____ 185Units total for Mon +++++++++++++++++++++++++++++++++++++++++++++++++++++++++++++++++++++++++++++++++++++++++++++++ Piqray 150 mg - 1 tablet (day26) 8:30 AM (03/18/2018) - FBG 243Mg % ->boost Lantus 20 u+Nov 70/30 30 u 1:30PM(03/18/2018) - CBG 155Mg % ->Nov 70/3015 u 6:00PM(03/18/2018) - CBG 227Mg % ->Nov 70/3020 u 10:00 PM (03/18/2018) - CBG 208Mg %  ->Lantus140u+Nov 70/3015 u _____ 240Units total for Tues +++++++++++++++++++++++++++++++++++++++++++++++++++++++++++++++++++++++++++++++++++++++++++++++ Piqray 150 mg - 1 tablet (day27) 8:30 AM (03/19/2018) - FBG 147Mg % ->Nov 70/30 10 u 1:30PM(03/19/2018) - CBG 193Mg % ->Nov 70/3010 u 6:00PM(03/19/2018) - CBG 161Mg % ->Nov 70/3015 u 10:00 PM (03/19/2018) - CBG 143Mg % ->Lantus140u+ _____ 175Units total for Wed +++++++++++++++++++++++++++++++++++++++++++++++++++++++++++++++++++++++++++++++++++++++++++++++ Piqray 150 mg - 1 tablet (day28) 8:30 AM (03/20/2018) - FBG 155Mg % ->Nov 70/30 10 u 1:30PM(03/20/2018) - CBG 186Mg % -> 6:00PM(03/20/2018) - CBG 178Mg % ->Nov 70/3020 u 10:00 PM (03/20/2018) - CBG 151Mg %  ->Lantus140u+ _____ 170Units total for Thurs +++++++++++++++++++++++++++++++++++++++++++++++++++++++++++++++++++++++++++++++++++++++++++++++ Piqray 150 mg - 1 tablet (day29) 8:30 AM (03/21/2018) - FBG 133Mg % ->Nov 70/30 10 u 1:30PM(03/21/2018) - CBG 59Mg % -> 6:00PM(03/21/2018) - CBG 132Mg % -> 10:00 PM (03/21/2018) - CBG 146Mg % ->Lantus140u+ _____ 150Units total for Fri +++++++++++++++++++++++++++++++++++++++++++++++++++++++++++++++++++++++++++++++++++++++++++++++ Piqray 150 mg - 1 tablet (day30)  8:30 AM (03/22/2018) - FBG 216Mg % ->Nov 70/30 15u 1:30PM(03/22/2018) - CBG 81Mg % -> 6:00PM(03/22/2018) - CBG 193Mg % ->Nov 70/3015 u 10:00 PM (03/22/2018) - CBG 153Mg %  ->Lantus140u+ _____ 170Units total for Sat +++++++++++++++++++++++++++++++++++++++++++++++++++++++++++++++++++++++++++++++++++++++++++++++ Piqray 150 mg - 1 tablet (day31) 8:30 AM (03/23/2018) - FBG 105Mg % -> 1:30PM(03/23/2018) - CBG 113Mg % -> 6:00PM(03/23/2018) - CBG 125Mg % -> 10:00 PM (03/23/2018) - CBG 181Mg % ->Lantus140u+Nov 70/3010 u _____ 150Units total forSun +++++++++++++++++++++++++++++++++++++++++++++++++++++++++++++++++++++++++++++++++++++++++++++++ Piqray 150 mg - 1 tablet (day32) 8:30 AM (812/2019) - FBG 145Mg % ->Nov 70/30 10 u  1:30PM(03/24/2018) - CBG 243Mg % ->Nov 70/3020 u 6:00PM(03/24/2018) - CBG 163Mg % ->Nov 70/3015 u 10:00 PM (03/24/2018) - CBG 207Mg %  ->Lantus 140u+ _____ 185 Units total for Mon +++++++++++++++++++++++++++++++++++++++++++++++++++++++++++++++++++++++++++++++++++++++++++++++ Piqray 150 mg - 1 tablet (day33)  8:30 AM (813/2019) - FBG59Mg % -> 1:30PM(03/25/2018) - CBG119Mg % -> 6:00PM(03/25/2018) - CBG148Mg % ->Nov 70/3010 u 10:00 PM (03/25/2018) - CBG160Mg % ->Lantus130u+ _____140Units total for Tues +++++++++++++++++++++++++++++++++++++++++++++++++++++++++++++++++++++++++++++++++++++++++++++++ Piqray 150 mg - 1 tablet (day34) 8:30 AM (814/2019) - FBG187Mg % ->Nov 70/3015 u 1:30PM(03/26/2018) - CBG76Mg % -> 6:00PM(03/26/2018) - CBG127Mg % -> 10:00 PM (03/26/2018) - CBG218Mg %  ->Lantus135u+ _____150Units total for Wed +++++++++++++++++++++++++++++++++++++++++++++++++++++++++++++++++++++++++++++++++++++++++++++++ Piqray 150 mg - 1 tablet (day35)  8:30 AM (815/2019) - FBG185Mg % ->Nov 70/30  1:30PM(03/27/2018) - CBG246Mg % ->Nov 70/3020 u 6:00PM(03/27/2018) - CBG278Mg % ->Nov 70/3025 u 10:00 PM (03/27/2018) - CBG199Mg % ->Lantus140u+Nov 70/30 _____185Units total forThurs ++++++++++++++++++++++++++++++++++++++++++++++++++++++++++++++++++++++++++++++++++++++++++++++++ Piqray 150 mg - 1 tablet (day36) 8:30 AM (03/28/2018) - FBG196Mg % ->Nov 70/3015 u 1:30PM(03/28/2018) - CBG118Mg % -> 6:00PM(03/28/2018) - CBG95Mg % -> 10:00 PM (03/28/2018) - CBG209Mg %  ->Lantus140u+ _____155Units total forFri ++++++++++++++++++++++++++++++++++++++++++++++++++++++++++++++++++++++++++++++++++++++++++++++++ Piqray 150 mg - 1 tablet (day37) 8:30 AM (03/29/2018) - FBG238Mg % ->Nov 70/3020 u 1:30PM(03/29/2018) - CBG194Mg % ->  6:00PM(03/29/2018)-CBG138Mg % -> 10:00 PM (03/29/2018) - CBG267Mg % ->Lantus140u+Nov 70/3010 u _____170Units total forSat ++++++++++++++++++++++++++++++++++++++++++++++++++++++++++++++++++++++++++++++++++++++++++++++++ Piqray 150 mg - 1 tablet (day38) 8:30 AM (03/30/2018) -  FBG  119Mg % -> 1:30PM(03/30/2018) - CBG ---- Mg% -> 6:00PM(03/30/2018) - CBG 183 Mg% -> Nov 70/3015 u  10:00 PM (03/30/2018) - CBG   234Mg %  ->Lantus140  u+ Nov 70/30  5 u  _____  160Units total forSun ++++++++++++++++++++++++++++++++++++++++++++++++++++++++++++++++++++++++++++++++++++++++++++++++                                                            No  Piqray   (day39) 8:30 AM (03/31/2018) - FBG 325  Mg% ->Nov 70/30 30 u u12:00PM(03/31/2018) - CBG 260Mg % ->   Nov 70/30    10 u    2:00     PM(04/01/2018) - CBG 144Mg % -> 6:00PM(03/31/2018) - CBG 67 Mg% -> 10:00 PM (03/31/2018) - CBG 148   Mg% ->Lantus70u+  _____ 110  uUnits total forMon +++++++++++++++++++++++++++++++++++++++++++++++++++++++++++++++++++++++++++++++++++++++++++++++   NoPiqray      (day40) 8:30 AM (04/01/2018) - FBG    55 Mg%   ->    1:00  PM(04/01/2018) - CBG   220Mg % ->   2:00     PM(04/01/2018) - CBG  120 Mg% -> 10:00  PM (04/01/2018) - CBG  111Mg % ->+         _____ 0Units total for Tues +++++++++++++++++++++++++++++++++++++++++++++++++++++++++++++++++++++++++++++++++++++++++++++++++ No   Piqray    (day41) 8:30 AM (04/02/2018) - FBG   64   Mg%   ->  Nov 70/30   1:30PM(04/02/2018) - CBG       Mg% ->Nov 70/30    6:00PM(04/02/2018) - CBG Mg% ->Nov 70/30 10:00 PM (04/02/2018) - CBG Mg% ->Lantusu+   Nov 70/30 _____ Units total for Wed +++++++++++++++++++++++++++++++++++++++++++++++++++++++++++++++++++++++++++++++++++++++++++++++++ No Piqray  (day42) 8:30 AM (04/03/2018) - FBG         Mg%   ->Nov 70/30   1:30PM(04/03/2018) - CBG       Mg% ->Nov 70/30  6:00PM(04/03/2018) - CBG Mg% ->Nov 70/30 10:00 PM  (04/03/2018) - CBG Mg% ->Lantusu+Nov 70/30 _____ Units total forThurs +++++++++++++++++++++++++++++++++++++++++++++++++++++++++++++++++++++++++++++++++++++++++++++++++ No Piqray  (day43) 8:30 AM (04/04/2018) - FBG   Mg%  ->Nov 70/30  1:30PM(04/04/2018) - CBG Mg% ->Nov 70/30  6:00PM(04/04/2018) - CBG Mg% ->Nov 70/30 10:00 PM (8/232019) - CBG Mg% ->Lantusu+Nov 70/30 _____ Units total forFri +++++++++++++++++++++++++++++++++++++++++++++++++++++++++++++++++++++++++++++++++++++++++++++++++

## 2018-04-04 ENCOUNTER — Ambulatory Visit: Payer: Self-pay | Admitting: Hematology and Oncology

## 2018-04-07 ENCOUNTER — Telehealth: Payer: Self-pay

## 2018-04-07 ENCOUNTER — Inpatient Hospital Stay: Payer: Medicare Other | Admitting: Hematology and Oncology

## 2018-04-07 NOTE — Telephone Encounter (Signed)
LVM on pt's cell phone number regarding missed appt today.  Instructed pt to call back to reschedule.
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
## 2018-04-09 ENCOUNTER — Inpatient Hospital Stay: Payer: Medicare Other | Admitting: Hematology and Oncology

## 2018-04-09 ENCOUNTER — Telehealth: Payer: Self-pay | Admitting: Hematology and Oncology

## 2018-04-09 DIAGNOSIS — C7951 Secondary malignant neoplasm of bone: Secondary | ICD-10-CM

## 2018-04-09 DIAGNOSIS — Z9011 Acquired absence of right breast and nipple: Secondary | ICD-10-CM | POA: Diagnosis not present

## 2018-04-09 DIAGNOSIS — R739 Hyperglycemia, unspecified: Secondary | ICD-10-CM | POA: Diagnosis not present

## 2018-04-09 DIAGNOSIS — C787 Secondary malignant neoplasm of liver and intrahepatic bile duct: Secondary | ICD-10-CM

## 2018-04-09 DIAGNOSIS — Z9221 Personal history of antineoplastic chemotherapy: Secondary | ICD-10-CM

## 2018-04-09 DIAGNOSIS — C7802 Secondary malignant neoplasm of left lung: Secondary | ICD-10-CM | POA: Diagnosis not present

## 2018-04-09 DIAGNOSIS — C50511 Malignant neoplasm of lower-outer quadrant of right female breast: Secondary | ICD-10-CM

## 2018-04-09 DIAGNOSIS — G620 Drug-induced polyneuropathy: Secondary | ICD-10-CM | POA: Diagnosis not present

## 2018-04-09 DIAGNOSIS — J90 Pleural effusion, not elsewhere classified: Secondary | ICD-10-CM

## 2018-04-09 DIAGNOSIS — D6959 Other secondary thrombocytopenia: Secondary | ICD-10-CM | POA: Diagnosis not present

## 2018-04-09 DIAGNOSIS — Z923 Personal history of irradiation: Secondary | ICD-10-CM | POA: Diagnosis not present

## 2018-04-09 DIAGNOSIS — R7989 Other specified abnormal findings of blood chemistry: Secondary | ICD-10-CM | POA: Diagnosis not present

## 2018-04-09 DIAGNOSIS — G893 Neoplasm related pain (acute) (chronic): Secondary | ICD-10-CM | POA: Diagnosis not present

## 2018-04-09 DIAGNOSIS — J9811 Atelectasis: Secondary | ICD-10-CM

## 2018-04-09 DIAGNOSIS — Z170 Estrogen receptor positive status [ER+]: Principal | ICD-10-CM

## 2018-04-09 DIAGNOSIS — R0602 Shortness of breath: Secondary | ICD-10-CM | POA: Diagnosis not present

## 2018-04-09 DIAGNOSIS — R05 Cough: Secondary | ICD-10-CM | POA: Diagnosis not present

## 2018-04-09 DIAGNOSIS — Z5111 Encounter for antineoplastic chemotherapy: Secondary | ICD-10-CM | POA: Diagnosis not present

## 2018-04-09 DIAGNOSIS — R062 Wheezing: Secondary | ICD-10-CM | POA: Diagnosis not present

## 2018-04-09 MED ORDER — CAPECITABINE 500 MG PO TABS: 800.0000 mg/m2 | ORAL_TABLET | Freq: Two times a day (BID) | ORAL | 3 refills | Status: DC

## 2018-04-09 NOTE — Telephone Encounter (Signed)
Gave patient avs and calendar.   °
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
## 2018-04-15 ENCOUNTER — Telehealth: Payer: Self-pay | Admitting: Pharmacist

## 2018-04-15 DIAGNOSIS — Z170 Estrogen receptor positive status [ER+]: Principal | ICD-10-CM

## 2018-04-15 DIAGNOSIS — C50511 Malignant neoplasm of lower-outer quadrant of right female breast: Secondary | ICD-10-CM

## 2018-04-15 MED ORDER — CAPECITABINE 500 MG PO TABS: 800.0000 mg/m2 | ORAL_TABLET | Freq: Two times a day (BID) | ORAL | 3 refills | Status: DC

## 2018-04-15 NOTE — Telephone Encounter (Signed)
Oral Oncology Pharmacist Encounter  Received new prescription for Xeloda (capecitabine) for the treatment of metastatic breast cancer, planned duration until disease progression or unacceptable toxitiy.  Labs from 03/26/18 assessed, OK for treatment. Noted total bilirubin elevated to 1.6 (<1.5 x ULN), no dose adjustment recommended by manufacturer  Xeloda will be dosed at 810 mg/m2 BID for 14 days on, 7 days off, repeat every 21 days, as monotherapy  Current medication list in Epic reviewed, no significant DDIs with Xeloda identified.  Prescription has been e-scribed to the [facility] for benefits analysis and approval. No insurance authorization is required. Test claim at the [facility] revealed copayment $56.08 Patient with existing copayment grant from [employer]. Fatima Sanger remains active and will cover all out of pocket expenses for Xeloda through 08/12/18  Oral Oncology Clinic will continue to follow for initial counseling and start date.  Johny Drilling, PharmD, BCPS, BCOP  04/15/2018 11:48 AM Oral Oncology Clinic [phone]

## 2018-04-15 NOTE — Telephone Encounter (Signed)
Oral Chemotherapy Pharmacist Encounter   Attempted to reach patient to provide update and offer for initial counseling on oral medication: Xeloda.  No answer. Left VM for patient to call back for initial counseling and coordination of medication acquisition.   Johny Drilling, PharmD, BCPS, BCOP  04/15/2018   12:07 PM Oral Oncology Clinic [phone]

## 2018-04-16 MED FILL — CAPECITABINE 500 MG TABS: 500 | 21 days supply | Qty: 84 | Fill #0

## 2018-04-16 NOTE — Telephone Encounter (Signed)
[name]  Confirmed with [place] that Xeloda was shipped to the patient on 04/16/18.  [place] [facility] [place] Phone [phone] Fax [phone]

## 2018-04-16 NOTE — Telephone Encounter (Signed)
Oral Chemotherapy Pharmacist Encounter   I spoke with patient for overview of: Xeloda (capecitabine).   Counseled patient on administration, dosing, side effects, monitoring, drug-food interactions, safe handling, storage, and disposal.  Patient will take Xeloda 500mg  tablets, 3 tablets (1500mg ) by mouth in AM and 3 tabs (1500mg ) by mouth in PM, within 30 minutes of finishing meals, on days 1-14 of each 21 day cycle.   Xeloda start date: 04/22/18  Adverse effects include but are not limited to: fatigue, decreased blood counts, GI upset, diarrhea, and hand-foot syndrome.  Patient has anti-emetic on hand and knows to take it if nausea develops.   Patient will obtain anti diarrheal and alert the office of 4 or more loose stools above baseline.  Reviewed with patient importance of keeping a medication schedule and plan for any missed doses.  Mrs. Kneisley voiced understanding and appreciation.   All questions answered. Medication reconciliation performed and medication/allergy list updated.  Patient understands Femara and Faslodex are discontinued.  No insurance authorization is required. Test claim at the pharmacy revealed copayment $56.08 Patient with existing copayment grant from [employer]. Fatima Sanger remains active and will cover all out of pocket expenses for Xeloda through 08/12/18  We will coordinate with the [place] to [place] on 04/16/18 for delivery to patient's home on 04/17/18. Xeloda start date planned for 04/22/18  Patient knows to call the office with questions or concerns. Oral Oncology Clinic will continue to follow.  Johny Drilling, PharmD, BCPS, BCOP  04/16/2018   9:24 AM Oral Oncology Clinic [phone]

## 2018-04-19 DIAGNOSIS — J449 Chronic obstructive pulmonary disease, unspecified: Secondary | ICD-10-CM | POA: Diagnosis not present

## 2018-04-23 ENCOUNTER — Ambulatory Visit: Payer: Self-pay | Admitting: Emergency Medicine
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
## 2018-04-28 ENCOUNTER — Telehealth: Payer: Self-pay | Admitting: Hematology and Oncology

## 2018-04-28 ENCOUNTER — Inpatient Hospital Stay: Payer: Medicare Other | Attending: Hematology and Oncology

## 2018-04-28 ENCOUNTER — Other Ambulatory Visit: Payer: Self-pay | Admitting: Cardiovascular Disease

## 2018-04-28 ENCOUNTER — Inpatient Hospital Stay: Payer: Medicare Other | Admitting: Hematology and Oncology

## 2018-04-28 DIAGNOSIS — Z9221 Personal history of antineoplastic chemotherapy: Secondary | ICD-10-CM | POA: Diagnosis not present

## 2018-04-28 DIAGNOSIS — C787 Secondary malignant neoplasm of liver and intrahepatic bile duct: Secondary | ICD-10-CM

## 2018-04-28 DIAGNOSIS — C7802 Secondary malignant neoplasm of left lung: Secondary | ICD-10-CM

## 2018-04-28 DIAGNOSIS — Z9011 Acquired absence of right breast and nipple: Secondary | ICD-10-CM

## 2018-04-28 DIAGNOSIS — Z79899 Other long term (current) drug therapy: Secondary | ICD-10-CM | POA: Diagnosis not present

## 2018-04-28 DIAGNOSIS — Z923 Personal history of irradiation: Secondary | ICD-10-CM | POA: Diagnosis not present

## 2018-04-28 DIAGNOSIS — C7951 Secondary malignant neoplasm of bone: Secondary | ICD-10-CM

## 2018-04-28 DIAGNOSIS — J90 Pleural effusion, not elsewhere classified: Secondary | ICD-10-CM

## 2018-04-28 DIAGNOSIS — C7800 Secondary malignant neoplasm of unspecified lung: Secondary | ICD-10-CM

## 2018-04-28 DIAGNOSIS — C50511 Malignant neoplasm of lower-outer quadrant of right female breast: Secondary | ICD-10-CM

## 2018-04-28 DIAGNOSIS — Z5111 Encounter for antineoplastic chemotherapy: Secondary | ICD-10-CM | POA: Diagnosis present

## 2018-04-28 DIAGNOSIS — Z170 Estrogen receptor positive status [ER+]: Secondary | ICD-10-CM | POA: Diagnosis not present

## 2018-04-28 LAB — CMP (CANCER CENTER ONLY)
ALBUMIN: 3.5 g/dL (ref 3.5–5.0)
ALT: 22 U/L (ref 0–44)
AST: 48 U/L — AB (ref 15–41)
Alkaline Phosphatase: 100 U/L (ref 38–126)
Anion gap: 9 (ref 5–15)
BUN: 6 mg/dL (ref 6–20)
CHLORIDE: 98 mmol/L (ref 98–111)
CO2: 32 mmol/L (ref 22–32)
Calcium: 8.9 mg/dL (ref 8.9–10.3)
Creatinine: 0.75 mg/dL (ref 0.44–1.00)
GFR, Est AFR Am: >60 mL/min (ref >60)
Glucose, Bld: 170 mg/dL — ABNORMAL HIGH (ref 70–99)
POTASSIUM: 3.2 mmol/L — AB (ref 3.5–5.1)
SODIUM: 139 mmol/L (ref 135–145)
Total Bilirubin: 1.2 mg/dL (ref 0.3–1.2)
Total Protein: 7.6 g/dL (ref 6.5–8.1)

## 2018-04-28 LAB — CBC WITH DIFFERENTIAL (CANCER CENTER ONLY)
Basophils Absolute: 0 10*3/uL (ref 0.0–0.1)
Basophils Relative: 0 %
EOS PCT: 2 %
Eosinophils Absolute: 0.1 10*3/uL (ref 0.0–0.5)
HCT: 41.2 % (ref 34.8–46.6)
HEMOGLOBIN: 14 g/dL (ref 11.6–15.9)
LYMPHS ABS: 1.6 10*3/uL (ref 0.9–3.3)
LYMPHS PCT: 22 %
MCH: 36.2 pg — AB (ref 25.1–34.0)
MCHC: 34 g/dL (ref 31.5–36.0)
MCV: 106.5 fL — AB (ref 79.5–101.0)
MONOS PCT: 12 %
Monocytes Absolute: 0.8 10*3/uL (ref 0.1–0.9)
Neutro Abs: 4.6 10*3/uL (ref 1.5–6.5)
Neutrophils Relative %: 64 %
Platelet Count: 175 10*3/uL (ref 145–400)
RBC: 3.87 MIL/uL (ref 3.70–5.45)
RDW: 14 % (ref 11.2–14.5)
WBC Count: 7.2 10*3/uL (ref 3.9–10.3)

## 2018-04-28 MED ORDER — HYDROMORPHONE HCL 2 MG PO TABS: ORAL_TABLET | ORAL | 0 refills | Status: DC

## 2018-04-28 MED ORDER — FENTANYL 25 MCG/HR TD PT72: 25.0000 ug | MEDICATED_PATCH | TRANSDERMAL | 0 refills | Status: DC

## 2018-04-28 MED FILL — fentaNYL 25 MCG/HR PT72: 25 | 30 days supply | Qty: 10 | Fill #0

## 2018-04-28 MED FILL — LORazepam 2 MG TABS: 2 | 30 days supply | Qty: 30 | Fill #2

## 2018-04-28 MED FILL — HYDROmorphone HCL 2 MG TABS: 2 | 15 days supply | Qty: 90 | Fill #0
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
## 2018-04-28 NOTE — Telephone Encounter (Signed)
Gave avs and calendar ° °

## 2018-04-29 NOTE — Telephone Encounter (Signed)
Patient was last seen 11/08/2016.  Medication is no longer on medication list.  Please advise for refill.

## 2018-05-02 NOTE — Telephone Encounter (Signed)
Left a message with the patient to call back regarding the Bisoprolol.

## 2018-05-07 DIAGNOSIS — H5213 Myopia, bilateral: Secondary | ICD-10-CM | POA: Diagnosis not present

## 2018-05-07 NOTE — Telephone Encounter (Signed)
Left a message for the patient to call back.  

## 2018-05-08 MED FILL — PROAIR HFA 90 MCG INHALER: 108 (90 BAS | 50 days supply | Qty: 9 | Fill #2

## 2018-05-08 MED FILL — ALBUTEROL 0.083% INHAL SOLN: (2.5 MG/3ML | 7 days supply | Qty: 90 | Fill #2

## 2018-05-08 MED FILL — CAPECITABINE 500 MG TABS: 500 | 21 days supply | Qty: 84 | Fill #1

## 2018-05-08 NOTE — Telephone Encounter (Signed)
Encounter closed. Patient never returned the call pertaining to Bisoprolol. This medication is no longer on her medication list.

## 2018-05-08 NOTE — Telephone Encounter (Signed)
Left a message for the patient to call back.  

## 2018-05-12 ENCOUNTER — Ambulatory Visit: Payer: Self-pay | Admitting: Internal Medicine

## 2018-05-13 ENCOUNTER — Other Ambulatory Visit: Payer: Self-pay | Admitting: Cardiovascular Disease

## 2018-05-14 NOTE — Telephone Encounter (Signed)
Please contact pt for an appointment last appointment 3/18

## 2018-05-14 NOTE — Telephone Encounter (Signed)
lmov to schedule appt  °

## 2018-05-19 DIAGNOSIS — J449 Chronic obstructive pulmonary disease, unspecified: Secondary | ICD-10-CM | POA: Diagnosis not present

## 2018-05-20 NOTE — Telephone Encounter (Signed)
Patient is scheduled for 07/29/18 to see Dr Fletcher Anon

## 2018-05-27 ENCOUNTER — Inpatient Hospital Stay: Payer: Medicare Other | Admitting: Hematology and Oncology

## 2018-05-27 ENCOUNTER — Inpatient Hospital Stay: Payer: Medicare Other
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
## 2018-05-30 MED FILL — CAPECITABINE 500 MG TABS: 500 | 21 days supply | Qty: 84 | Fill #2

## 2018-06-02 ENCOUNTER — Telehealth: Payer: Self-pay | Admitting: Hematology and Oncology

## 2018-06-02 ENCOUNTER — Inpatient Hospital Stay: Payer: Medicare Other | Attending: Hematology and Oncology

## 2018-06-02 ENCOUNTER — Inpatient Hospital Stay (HOSPITAL_BASED_OUTPATIENT_CLINIC_OR_DEPARTMENT_OTHER): Payer: Medicare Other | Admitting: Hematology and Oncology

## 2018-06-02 DIAGNOSIS — Z923 Personal history of irradiation: Secondary | ICD-10-CM | POA: Diagnosis not present

## 2018-06-02 DIAGNOSIS — C787 Secondary malignant neoplasm of liver and intrahepatic bile duct: Secondary | ICD-10-CM | POA: Diagnosis not present

## 2018-06-02 DIAGNOSIS — C7800 Secondary malignant neoplasm of unspecified lung: Secondary | ICD-10-CM

## 2018-06-02 DIAGNOSIS — Z170 Estrogen receptor positive status [ER+]: Secondary | ICD-10-CM | POA: Diagnosis not present

## 2018-06-02 DIAGNOSIS — Z9221 Personal history of antineoplastic chemotherapy: Secondary | ICD-10-CM | POA: Diagnosis not present

## 2018-06-02 DIAGNOSIS — Z79811 Long term (current) use of aromatase inhibitors: Secondary | ICD-10-CM | POA: Diagnosis not present

## 2018-06-02 DIAGNOSIS — Z9011 Acquired absence of right breast and nipple: Secondary | ICD-10-CM | POA: Insufficient documentation

## 2018-06-02 DIAGNOSIS — C7951 Secondary malignant neoplasm of bone: Secondary | ICD-10-CM

## 2018-06-02 DIAGNOSIS — C50511 Malignant neoplasm of lower-outer quadrant of right female breast: Secondary | ICD-10-CM

## 2018-06-02 LAB — CMP (CANCER CENTER ONLY)
ALK PHOS: 122 U/L (ref 38–126)
ALT: 41 U/L (ref 0–44)
AST: 99 U/L — ABNORMAL HIGH (ref 15–41)
Albumin: 3.3 g/dL — ABNORMAL LOW (ref 3.5–5.0)
Anion gap: 9 (ref 5–15)
BILIRUBIN TOTAL: 1 mg/dL (ref 0.3–1.2)
BUN: 7 mg/dL (ref 6–20)
CALCIUM: 8.8 mg/dL — AB (ref 8.9–10.3)
CO2: 33 mmol/L — AB (ref 22–32)
CREATININE: 0.76 mg/dL (ref 0.44–1.00)
Chloride: 99 mmol/L (ref 98–111)
GFR, Estimated: >60 mL/min (ref >60)
Glucose, Bld: 179 mg/dL — ABNORMAL HIGH (ref 70–99)
Potassium: 3.6 mmol/L (ref 3.5–5.1)
SODIUM: 141 mmol/L (ref 135–145)
Total Protein: 7.6 g/dL (ref 6.5–8.1)

## 2018-06-02 LAB — CBC WITH DIFFERENTIAL (CANCER CENTER ONLY)
Abs Immature Granulocytes: 0.02 10*3/uL (ref 0.00–0.07)
BASOS ABS: 0 10*3/uL (ref 0.0–0.1)
Basophils Relative: 0 %
EOS PCT: 1 %
Eosinophils Absolute: 0 10*3/uL (ref 0.0–0.5)
HEMATOCRIT: 35.9 % — AB (ref 36.0–46.0)
Hemoglobin: 12.6 g/dL (ref 12.0–15.0)
Immature Granulocytes: 0 %
LYMPHS ABS: 1.2 10*3/uL (ref 0.7–4.0)
Lymphocytes Relative: 16 %
MCH: 38.4 pg — ABNORMAL HIGH (ref 26.0–34.0)
MCHC: 35.1 g/dL (ref 30.0–36.0)
MCV: 109.5 fL — AB (ref 80.0–100.0)
Monocytes Absolute: 0.8 10*3/uL (ref 0.1–1.0)
Monocytes Relative: 10 %
NRBC: 0 % (ref 0.0–0.2)
Neutro Abs: 5.3 10*3/uL (ref 1.7–7.7)
Neutrophils Relative %: 73 %
Platelet Count: 138 10*3/uL — ABNORMAL LOW (ref 150–400)
RBC: 3.28 MIL/uL — ABNORMAL LOW (ref 3.87–5.11)
RDW: 18.5 % — AB (ref 11.5–15.5)
WBC Count: 7.3 10*3/uL (ref 4.0–10.5)

## 2018-06-02 MED ORDER — HYDROMORPHONE HCL 2 MG PO TABS: ORAL_TABLET | ORAL | 0 refills | Status: DC

## 2018-06-02 MED ORDER — FENTANYL 25 MCG/HR TD PT72: 25.0000 ug | MEDICATED_PATCH | TRANSDERMAL | 0 refills | Status: DC

## 2018-06-02 MED FILL — HYDROmorphone HCL 2 MG TABS: 2 | 15 days supply | Qty: 90 | Fill #0

## 2018-06-02 MED FILL — LORazepam 2 MG TABS: 2 | 30 days supply | Qty: 30 | Fill #3

## 2018-06-02 MED FILL — PROMETHAZINE 25 MG TABLET: 25 | 7 days supply | Qty: 30 | Fill #1

## 2018-06-02 MED FILL — BISOPROLOL FUMARATE 5 MG TA: 5 | 90 days supply | Qty: 90 | Fill #0

## 2018-06-02 MED FILL — fentaNYL 25 MCG/HR PT72: 25 | 30 days supply | Qty: 10 | Fill #0

## 2018-06-02 MED FILL — ALBUTEROL 0.083% INHAL SOLN: (2.5 MG/3ML | 7 days supply | Qty: 90 | Fill #3

## 2018-06-02 NOTE — Telephone Encounter (Signed)
Gave avs and calendar ° °
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
## 2018-06-19 DIAGNOSIS — J449 Chronic obstructive pulmonary disease, unspecified: Secondary | ICD-10-CM | POA: Diagnosis not present

## 2018-06-26 ENCOUNTER — Other Ambulatory Visit: Payer: Self-pay | Admitting: Emergency Medicine

## 2018-06-26 ENCOUNTER — Other Ambulatory Visit: Payer: Self-pay | Admitting: Hematology and Oncology

## 2018-06-26 DIAGNOSIS — C7951 Secondary malignant neoplasm of bone: Secondary | ICD-10-CM

## 2018-06-26 DIAGNOSIS — C50511 Malignant neoplasm of lower-outer quadrant of right female breast: Secondary | ICD-10-CM

## 2018-06-26 DIAGNOSIS — C7800 Secondary malignant neoplasm of unspecified lung: Secondary | ICD-10-CM

## 2018-06-26 MED FILL — CAPECITABINE 500 MG TABS: 500 | 21 days supply | Qty: 84 | Fill #3

## 2018-06-27 ENCOUNTER — Telehealth: Payer: Self-pay

## 2018-06-27 NOTE — Telephone Encounter (Signed)
Pt called to request refill of her fentanyl and dilaudid medication. She normally gets this shipped to her home. Told pt that Dr.Gudena is not in the office today and will have to wait until next week to send in request electronically. Pt verbalized understanding.

## 2018-06-30 ENCOUNTER — Ambulatory Visit: Admission: RE | Admit: 2018-06-30 | Payer: Medicare Other | Source: Ambulatory Visit

## 2018-06-30 ENCOUNTER — Other Ambulatory Visit: Payer: Self-pay | Admitting: Hematology and Oncology

## 2018-06-30 ENCOUNTER — Ambulatory Visit: Payer: Self-pay | Admitting: Internal Medicine

## 2018-06-30 ENCOUNTER — Telehealth: Payer: Self-pay

## 2018-06-30 DIAGNOSIS — C50919 Malignant neoplasm of unspecified site of unspecified female breast: Secondary | ICD-10-CM | POA: Insufficient documentation

## 2018-06-30 DIAGNOSIS — C7951 Secondary malignant neoplasm of bone: Secondary | ICD-10-CM

## 2018-06-30 DIAGNOSIS — E88819 Insulin resistance, unspecified: Secondary | ICD-10-CM | POA: Insufficient documentation

## 2018-06-30 DIAGNOSIS — C7800 Secondary malignant neoplasm of unspecified lung: Secondary | ICD-10-CM

## 2018-06-30 DIAGNOSIS — E559 Vitamin D deficiency, unspecified: Secondary | ICD-10-CM | POA: Insufficient documentation

## 2018-06-30 DIAGNOSIS — E8881 Metabolic syndrome: Secondary | ICD-10-CM | POA: Insufficient documentation

## 2018-06-30 DIAGNOSIS — E782 Mixed hyperlipidemia: Secondary | ICD-10-CM | POA: Insufficient documentation

## 2018-06-30 DIAGNOSIS — R030 Elevated blood-pressure reading, without diagnosis of hypertension: Secondary | ICD-10-CM | POA: Insufficient documentation

## 2018-06-30 DIAGNOSIS — C50511 Malignant neoplasm of lower-outer quadrant of right female breast: Secondary | ICD-10-CM

## 2018-06-30 MED ORDER — HYDROMORPHONE HCL 2 MG PO TABS: ORAL_TABLET | ORAL | 0 refills | Status: DC

## 2018-06-30 MED ORDER — FENTANYL 25 MCG/HR TD PT72: 25.0000 ug | MEDICATED_PATCH | TRANSDERMAL | 0 refills | Status: DC

## 2018-06-30 MED FILL — TRELEGY ELLIPTA 100-62.5-25: 100-62.5-25 | 30 days supply | Qty: 60 | Fill #0

## 2018-06-30 MED FILL — LORazepam 2 MG TABS: 2 | 30 days supply | Qty: 30 | Fill #4

## 2018-06-30 MED FILL — HYDROmorphone HCL 2 MG TABS: 2 | 15 days supply | Qty: 90 | Fill #0

## 2018-06-30 MED FILL — PROMETHAZINE 25 MG TABLET: 25 | 7 days supply | Qty: 30 | Fill #2

## 2018-06-30 MED FILL — ALBUTEROL 0.083 MG/ML SOLN: (2.5 MG/3ML | 7 days supply | Qty: 90 | Fill #4

## 2018-06-30 MED FILL — PROAIR HFA 90 MCG INHALER: 108 (90 BAS | 50 days supply | Qty: 9 | Fill #3

## 2018-06-30 MED FILL — FUROSEMIDE 40 MG TAB: 40 | 30 days supply | Qty: 30 | Fill #0

## 2018-06-30 MED FILL — FENTANYL 25 MCG/HR PT72: 25 | 30 days supply | Qty: 10 | Fill #0

## 2018-06-30 NOTE — Progress Notes (Signed)
RESCHEDULED

## 2018-06-30 NOTE — Telephone Encounter (Signed)
Called pt and lvm with call back number to let her know that [facility] Radiology dept left a message stating that she had missed her PET scan appt today and they are unable to get a hold of her. Told pt to call and get PET scan rescheduled and she'll need to call our office to change her appt tomorrow until PET scan rescheduled.

## 2018-07-01 ENCOUNTER — Ambulatory Visit: Payer: Self-pay | Admitting: Hematology and Oncology

## 2018-07-01 ENCOUNTER — Other Ambulatory Visit: Payer: Self-pay

## 2018-07-15 ENCOUNTER — Encounter (HOSPITAL_COMMUNITY): Admission: RE | Admit: 2018-07-15 | Payer: Medicare Other | Source: Ambulatory Visit

## 2018-07-17 ENCOUNTER — Telehealth: Payer: Self-pay

## 2018-07-17 ENCOUNTER — Inpatient Hospital Stay: Payer: Medicare Other

## 2018-07-17 ENCOUNTER — Inpatient Hospital Stay: Payer: Medicare Other | Admitting: Hematology and Oncology

## 2018-07-17 MED FILL — PROMETHAZINE 25 MG TABLET: 25 | 7 days supply | Qty: 30 | Fill #3
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
## 2018-07-17 NOTE — Telephone Encounter (Signed)
Pt called to cancel appt today. She is not feeling well and is under the weather with a cold. She just wants to stay in bed and rest for today. Pt still needs to reschedule her scans. Advised that pt call back to reschedule, once she has a date set for her scans.

## 2018-07-18 ENCOUNTER — Other Ambulatory Visit: Payer: Self-pay | Admitting: Hematology and Oncology

## 2018-07-18 DIAGNOSIS — C7800 Secondary malignant neoplasm of unspecified lung: Secondary | ICD-10-CM

## 2018-07-18 DIAGNOSIS — C50511 Malignant neoplasm of lower-outer quadrant of right female breast: Secondary | ICD-10-CM

## 2018-07-18 DIAGNOSIS — C7951 Secondary malignant neoplasm of bone: Secondary | ICD-10-CM

## 2018-07-18 DIAGNOSIS — Z170 Estrogen receptor positive status [ER+]: Principal | ICD-10-CM

## 2018-07-18 MED ORDER — FENTANYL 25 MCG/HR TD PT72: 25.0000 ug | MEDICATED_PATCH | TRANSDERMAL | 0 refills | Status: DC

## 2018-07-18 MED FILL — CAPECITABINE 500 MG TABS: 500 | 21 days supply | Qty: 84 | Fill #0

## 2018-07-19 DIAGNOSIS — J449 Chronic obstructive pulmonary disease, unspecified: Secondary | ICD-10-CM | POA: Diagnosis not present
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
## 2018-07-28 ENCOUNTER — Ambulatory Visit (HOSPITAL_COMMUNITY)
Admission: RE | Admit: 2018-07-28 | Discharge: 2018-07-28 | Disposition: A | Discharge status: Home / Self Care | Payer: Medicare Other | Source: Ambulatory Visit | Attending: Hematology and Oncology | Admitting: Hematology and Oncology

## 2018-07-28 DIAGNOSIS — C50511 Malignant neoplasm of lower-outer quadrant of right female breast: Secondary | ICD-10-CM | POA: Diagnosis not present

## 2018-07-28 DIAGNOSIS — C50919 Malignant neoplasm of unspecified site of unspecified female breast: Secondary | ICD-10-CM | POA: Diagnosis not present

## 2018-07-28 DIAGNOSIS — Z170 Estrogen receptor positive status [ER+]: Secondary | ICD-10-CM | POA: Insufficient documentation

## 2018-07-28 LAB — GLUCOSE, CAPILLARY: Glucose-Capillary: 120 mg/dL — ABNORMAL HIGH (ref 70–99)

## 2018-07-28 MED ORDER — FLUDEOXYGLUCOSE F - 18 (FDG) INJECTION
8.9500 | Freq: Once | INTRAVENOUS | Status: AC
  Administered 2018-07-28: 8.95 via INTRAVENOUS

## 2018-07-28 MED FILL — TRELEGY ELLIPTA 100-62.5-25: 100-62.5-25 | 30 days supply | Qty: 60 | Fill #1

## 2018-07-28 MED FILL — LORazepam 2 MG TABS: 2 | 30 days supply | Qty: 30 | Fill #5

## 2018-07-28 MED FILL — FENTANYL 25 MCG/HR PT72: 25 | 30 days supply | Qty: 10 | Fill #0

## 2018-07-28 MED FILL — ALBUTEROL 0.083 MG/ML SOLN: (2.5 MG/3ML | 7 days supply | Qty: 90 | Fill #5

## 2018-07-29 ENCOUNTER — Ambulatory Visit: Payer: Self-pay | Admitting: Cardiovascular Disease

## 2018-07-29 ENCOUNTER — Telehealth: Payer: Self-pay

## 2018-07-29 ENCOUNTER — Inpatient Hospital Stay: Payer: Medicare Other | Attending: Hematology and Oncology | Admitting: Hematology and Oncology

## 2018-07-29 ENCOUNTER — Inpatient Hospital Stay: Payer: Medicare Other

## 2018-07-29 VITALS — BP 122/86 | HR 109 | Temp 98.4°F | Resp 17 | Ht 62.5 in | Wt 166.6 lb

## 2018-07-29 DIAGNOSIS — Z923 Personal history of irradiation: Secondary | ICD-10-CM | POA: Diagnosis not present

## 2018-07-29 DIAGNOSIS — Z9011 Acquired absence of right breast and nipple: Secondary | ICD-10-CM | POA: Diagnosis not present

## 2018-07-29 DIAGNOSIS — C7951 Secondary malignant neoplasm of bone: Secondary | ICD-10-CM

## 2018-07-29 DIAGNOSIS — C7800 Secondary malignant neoplasm of unspecified lung: Secondary | ICD-10-CM | POA: Diagnosis not present

## 2018-07-29 DIAGNOSIS — C50511 Malignant neoplasm of lower-outer quadrant of right female breast: Secondary | ICD-10-CM | POA: Insufficient documentation

## 2018-07-29 DIAGNOSIS — C787 Secondary malignant neoplasm of liver and intrahepatic bile duct: Secondary | ICD-10-CM | POA: Insufficient documentation

## 2018-07-29 DIAGNOSIS — Z23 Encounter for immunization: Secondary | ICD-10-CM | POA: Insufficient documentation

## 2018-07-29 DIAGNOSIS — Z170 Estrogen receptor positive status [ER+]: Secondary | ICD-10-CM

## 2018-07-29 DIAGNOSIS — Z9221 Personal history of antineoplastic chemotherapy: Secondary | ICD-10-CM | POA: Insufficient documentation

## 2018-07-29 LAB — CMP (CANCER CENTER ONLY)
ALBUMIN: 3.5 g/dL (ref 3.5–5.0)
ALT: 50 U/L — ABNORMAL HIGH (ref 0–44)
ANION GAP: 11 (ref 5–15)
AST: 128 U/L — ABNORMAL HIGH (ref 15–41)
Alkaline Phosphatase: 123 U/L (ref 38–126)
BUN: 12 mg/dL (ref 8–23)
CALCIUM: 9.6 mg/dL (ref 8.9–10.3)
CHLORIDE: 94 mmol/L — AB (ref 98–111)
CO2: 34 mmol/L — AB (ref 22–32)
Creatinine: 0.76 mg/dL (ref 0.44–1.00)
GFR, Est AFR Am: >60 mL/min (ref >60)
GFR, Estimated: >60 mL/min (ref >60)
Glucose, Bld: 154 mg/dL — ABNORMAL HIGH (ref 70–99)
POTASSIUM: 3.6 mmol/L (ref 3.5–5.1)
SODIUM: 139 mmol/L (ref 135–145)
Total Bilirubin: 1.4 mg/dL — ABNORMAL HIGH (ref 0.3–1.2)
Total Protein: 7.9 g/dL (ref 6.5–8.1)

## 2018-07-29 LAB — CBC WITH DIFFERENTIAL (CANCER CENTER ONLY)
ABS IMMATURE GRANULOCYTES: 0.02 10*3/uL (ref 0.00–0.07)
BASOS ABS: 0 10*3/uL (ref 0.0–0.1)
Basophils Relative: 1 %
EOS ABS: 0 10*3/uL (ref 0.0–0.5)
Eosinophils Relative: 0 %
HEMATOCRIT: 36 % (ref 36.0–46.0)
Hemoglobin: 12.7 g/dL (ref 12.0–15.0)
IMMATURE GRANULOCYTES: 0 %
LYMPHS ABS: 1.1 10*3/uL (ref 0.7–4.0)
LYMPHS PCT: 16 %
MCH: 41.4 pg — ABNORMAL HIGH (ref 26.0–34.0)
MCHC: 35.3 g/dL (ref 30.0–36.0)
MCV: 117.3 fL — ABNORMAL HIGH (ref 80.0–100.0)
Monocytes Absolute: 0.9 10*3/uL (ref 0.1–1.0)
Monocytes Relative: 12 %
NEUTROS ABS: 5 10*3/uL (ref 1.7–7.7)
NEUTROS PCT: 71 %
Platelet Count: 222 10*3/uL (ref 150–400)
RBC: 3.07 MIL/uL — ABNORMAL LOW (ref 3.87–5.11)
RDW: 15.3 % (ref 11.5–15.5)
WBC Count: 7.1 10*3/uL (ref 4.0–10.5)
nRBC: 0 % (ref 0.0–0.2)

## 2018-07-29 MED ORDER — FENTANYL 50 MCG/HR TD PT72: 50.0000 ug | MEDICATED_PATCH | TRANSDERMAL | 0 refills | Status: DC

## 2018-07-29 MED ORDER — HYDROMORPHONE HCL 4 MG PO TABS: ORAL_TABLET | ORAL | 0 refills | Status: DC

## 2018-07-29 MED ORDER — INFLUENZA VAC SPLIT QUAD 0.5 ML IM SUSY
PREFILLED_SYRINGE | INTRAMUSCULAR | Status: AC
  Filled 2018-07-29: qty 0.5

## 2018-07-29 MED ORDER — INFLUENZA VAC SPLIT QUAD 0.5 ML IM SUSY
0.5000 mL | PREFILLED_SYRINGE | Freq: Once | INTRAMUSCULAR | Status: AC
  Administered 2018-07-29: 0.5 mL via INTRAMUSCULAR

## 2018-07-29 MED FILL — HYDROmorphone HCL 4 MG TABS: 4 | 20 days supply | Qty: 120 | Fill #0

## 2018-07-29 MED FILL — fentaNYL 50 MCG/HR PT72: 50 | 30 days supply | Qty: 10 | Fill #0

## 2018-07-29 NOTE — Assessment & Plan Note (Addendum)
Metastatic breast cancer with liver, bone, lung metastases Foundation one: ESR1 mutation (faslodex), FLT3 (ponatinib and sorafenib) mutation and PI3ca mutation (everolimus) BRCA: negative  Current treatment: Xeloda 1500 mg p.o. twice daily 2 weeks on 1 week off started 04/22/2018 stopped 07/29/2018 due to progression.  Prior treatment:  1.  Ibrance with Faslodex started 05/03/2016 to July 2019 2. Alpelisib started July 2019 stopped 04/06/2018 due to severe hyperglycemia uncontrolled.  Treatment options: I discussed with the patient about going on systemic chemotherapy with Abraxane day 1 day 8 and day 15 every 4 weeks. We will send for PDL 1 testing. If she is positive then she could get pembrolizumab.  Patient is fully understanding that her prognosis is poor.  Her main goal is to live until she can see her son's marriage 4 months from now.  This wedding is going to be in [place].  Severe pain in the left shoulder: I increase the dosage of fentanyl to 50 mcg and the Dilaudid to 4 mg every 6 hours as needed.  I sent new prescriptions for both of these.  Severe shortness of breath and air hunger: Continues on home oxygen.  Return to clinic based upon PD-L1 testing.  I will call her with these results and make further appointments depending on what treatment she would get.

## 2018-07-29 NOTE — Telephone Encounter (Signed)
Per Dr.Gudena, request to run PDL1 if possible today. Called [facility] and spoke with pathologist, but will need to request PDL1 to be sent out for case # [id number]. May take some time to receive results. Dr.Gudena was made aware.  [facility]: [phone], for follow up.

## 2018-07-30 ENCOUNTER — Telehealth: Payer: Self-pay | Admitting: Hematology and Oncology

## 2018-07-30 NOTE — Telephone Encounter (Signed)
Per 12/17 no los °

## 2018-08-04 ENCOUNTER — Other Ambulatory Visit (HOSPITAL_COMMUNITY)
Admission: RE | Admit: 2018-08-04 | Discharge: 2018-08-04 | Disposition: A | Discharge status: Home / Self Care | Payer: Medicare Other | Source: Ambulatory Visit | Attending: Oncology | Admitting: Oncology

## 2018-08-04 DIAGNOSIS — C7981 Secondary malignant neoplasm of breast: Secondary | ICD-10-CM | POA: Diagnosis not present

## 2018-08-07 ENCOUNTER — Other Ambulatory Visit: Payer: Self-pay

## 2018-08-08 ENCOUNTER — Telehealth: Payer: Self-pay

## 2018-08-08 NOTE — Telephone Encounter (Signed)
Patient left voicemail inquiring about what "plan" Dr. Lindi Adie would like to try.   Nurse reviewed chart. Treatment plan will be determined by PDL1 results.  Nurse called, PDL1 results are still pending. Case number [id number].   Nurse informed patient of above. Patient voiced understanding and will anticipate call once PDL1 is final.  No further needs at this time.

## 2018-08-11 ENCOUNTER — Telehealth: Payer: Self-pay

## 2018-08-11 NOTE — Telephone Encounter (Signed)
Reviewed previous TC with Dr. Gudena.  Patient wanting to initiate Letrozole and Ibrance again until PDL1 results are final.  Dr. Gudena in agreement.  Nurse notified patient.  Patient voiced understanding.  Pt has medications at home, already.  No further needs at this time.  

## 2018-08-15 ENCOUNTER — Other Ambulatory Visit: Payer: Self-pay | Admitting: Oncology

## 2018-08-18 ENCOUNTER — Telehealth: Payer: Self-pay

## 2018-08-18 NOTE — Telephone Encounter (Signed)
Pt called to follow up on pdl1 result. Called [facility] and its still currently pending. Should be back and resulted by the end of week or early next week. Asked if pt initiated letrozole and ibrance yet. Pt states that she tried it x 3 days but had been experiencing severe nausea, so she stopped it all together. She would like to wait for the results and see what Dr.Gudena would like to do for treatment. Suggested that pt call back towards the end of the week and follow up again. Pt verbalized understanding and has no further needs at this time.

## 2018-08-19 DIAGNOSIS — J449 Chronic obstructive pulmonary disease, unspecified: Secondary | ICD-10-CM | POA: Diagnosis not present

## 2018-08-21 ENCOUNTER — Other Ambulatory Visit: Payer: Self-pay | Admitting: Hematology and Oncology

## 2018-08-21 DIAGNOSIS — C50511 Malignant neoplasm of lower-outer quadrant of right female breast: Secondary | ICD-10-CM

## 2018-08-21 DIAGNOSIS — C7800 Secondary malignant neoplasm of unspecified lung: Secondary | ICD-10-CM

## 2018-08-21 DIAGNOSIS — C7951 Secondary malignant neoplasm of bone: Secondary | ICD-10-CM

## 2018-08-22 MED FILL — POTASSIUM CHLORIDE CRYS ER: 20 | 90 days supply | Qty: 180 | Fill #0

## 2018-08-22 MED FILL — PROAIR HFA 90 MCG INHALER: 108 (90 BAS | 50 days supply | Qty: 9 | Fill #4

## 2018-08-22 MED FILL — FUROSEMIDE 40 MG TAB: 40 | 30 days supply | Qty: 30 | Fill #1

## 2018-08-22 MED FILL — TRELEGY ELLIPTA 100-62.5-25: 100-62.5-25 | 30 days supply | Qty: 60 | Fill #2

## 2018-08-22 MED FILL — ALBUTEROL 0.083 MG/ML SOLN: (2.5 MG/3ML | 7 days supply | Qty: 90 | Fill #6

## 2018-08-25 ENCOUNTER — Other Ambulatory Visit: Payer: Self-pay | Admitting: Hematology and Oncology

## 2018-08-25 DIAGNOSIS — C50511 Malignant neoplasm of lower-outer quadrant of right female breast: Secondary | ICD-10-CM

## 2018-08-25 DIAGNOSIS — C7951 Secondary malignant neoplasm of bone: Secondary | ICD-10-CM

## 2018-08-25 DIAGNOSIS — C7800 Secondary malignant neoplasm of unspecified lung: Secondary | ICD-10-CM

## 2018-08-25 MED ORDER — HYDROMORPHONE HCL 4 MG PO TABS: ORAL_TABLET | ORAL | 0 refills | Status: DC

## 2018-08-25 MED ORDER — FENTANYL 50 MCG/HR TD PT72: 50.0000 ug | MEDICATED_PATCH | TRANSDERMAL | 0 refills | Status: DC

## 2018-08-25 MED FILL — HYDROmorphone HCL 4 MG TABS: 4 | 20 days supply | Qty: 120 | Fill #0

## 2018-08-25 MED FILL — PROMETHAZINE 25 MG TABLET: 25 | 7 days supply | Qty: 30 | Fill #0

## 2018-08-26 MED FILL — fentaNYL 50 MCG/HR PT72: 50 | 30 days supply | Qty: 10 | Fill #0

## 2018-08-26 MED FILL — LORazepam 2 MG TABS: 2 | 30 days supply | Qty: 30 | Fill #0

## 2018-08-27 ENCOUNTER — Telehealth: Payer: Self-pay | Admitting: Hematology and Oncology

## 2018-08-27 NOTE — Telephone Encounter (Signed)
I called and left a message with the patient that the PD-L1 test is -0%. Immunotherapy would not have any benefit for her. Because of this I will recommend chemotherapy with Abraxane.  Abraxane to be given days 1 and 8 every 3 weeks. I instructed her to call us back to determine when we can start chemotherapy.

## 2018-08-28 ENCOUNTER — Telehealth: Payer: Self-pay

## 2018-08-28 NOTE — Telephone Encounter (Signed)
Called pt to see when she would like to start her abraxane chemo infusion. Pt states that she would like to start week of 1/27 so she can arrange for transport. Will notify Dr.Gudena to initiate prior auth prior to starting. Pt prefers mid morning infusions if possible. Sent message to scheduling dept to make appt for pt.  Pt to call by midweek, next week, to follow up on appt if she doesn't hear anything from scheduling. No further needs at this time.

## 2018-08-29 ENCOUNTER — Telehealth: Payer: Self-pay | Admitting: Hematology and Oncology

## 2018-08-29 NOTE — Telephone Encounter (Signed)
Scheduled appt per 1/16 sch message - pt is aware of appt date and time   

## 2018-09-04 ENCOUNTER — Telehealth: Payer: Self-pay

## 2018-09-04 NOTE — Telephone Encounter (Signed)
Pt called reporting worsening pain from shoulder, neck and spine, over the past 3-4 days. Pt taking her scheduled pain medications on time. Pt reports increased activity the last 2 days, but minimal exertion. Pt denies any fall over the past week. Denies vision, numbness/tingling, weakness. Pt does report increased sob and pain in the spine. Pt is taking ibuprofen to help. Suggested heat pack over the site. Pt also increased fentanyl patch to 58mcg.   Will report symptoms to [place] and seek advice if pt needs a stat xray or ct, as well as appt visit with Symptom management tomorrow. Pt verbalized understanding and has no further needs today.

## 2018-09-05 ENCOUNTER — Telehealth: Payer: Self-pay

## 2018-09-05 ENCOUNTER — Inpatient Hospital Stay: Payer: Medicare Other

## 2018-09-05 ENCOUNTER — Other Ambulatory Visit: Payer: Self-pay

## 2018-09-05 ENCOUNTER — Inpatient Hospital Stay: Payer: Medicare Other | Attending: Hematology and Oncology | Admitting: Medical

## 2018-09-05 ENCOUNTER — Ambulatory Visit (HOSPITAL_COMMUNITY)
Admission: RE | Admit: 2018-09-05 | Discharge: 2018-09-05 | Disposition: A | Discharge status: Home / Self Care | Payer: Medicare Other | Source: Ambulatory Visit | Attending: Medical | Admitting: Medical

## 2018-09-05 VITALS — BP 104/71 | HR 113 | Resp 20 | Ht 62.5 in | Wt 163.3 lb

## 2018-09-05 DIAGNOSIS — C50511 Malignant neoplasm of lower-outer quadrant of right female breast: Secondary | ICD-10-CM | POA: Diagnosis not present

## 2018-09-05 DIAGNOSIS — C787 Secondary malignant neoplasm of liver and intrahepatic bile duct: Secondary | ICD-10-CM | POA: Diagnosis not present

## 2018-09-05 DIAGNOSIS — J90 Pleural effusion, not elsewhere classified: Secondary | ICD-10-CM | POA: Diagnosis not present

## 2018-09-05 DIAGNOSIS — Z923 Personal history of irradiation: Secondary | ICD-10-CM | POA: Diagnosis not present

## 2018-09-05 DIAGNOSIS — R0602 Shortness of breath: Secondary | ICD-10-CM | POA: Diagnosis not present

## 2018-09-05 DIAGNOSIS — G893 Neoplasm related pain (acute) (chronic): Secondary | ICD-10-CM | POA: Insufficient documentation

## 2018-09-05 DIAGNOSIS — Z79811 Long term (current) use of aromatase inhibitors: Secondary | ICD-10-CM

## 2018-09-05 DIAGNOSIS — C7951 Secondary malignant neoplasm of bone: Secondary | ICD-10-CM

## 2018-09-05 DIAGNOSIS — Z9221 Personal history of antineoplastic chemotherapy: Secondary | ICD-10-CM

## 2018-09-05 DIAGNOSIS — Z170 Estrogen receptor positive status [ER+]: Secondary | ICD-10-CM

## 2018-09-05 DIAGNOSIS — Z9011 Acquired absence of right breast and nipple: Secondary | ICD-10-CM | POA: Insufficient documentation

## 2018-09-05 DIAGNOSIS — C7800 Secondary malignant neoplasm of unspecified lung: Secondary | ICD-10-CM | POA: Diagnosis not present

## 2018-09-05 LAB — CMP (CANCER CENTER ONLY)
ALT: 30 U/L (ref 0–44)
AST: 75 U/L — ABNORMAL HIGH (ref 15–41)
Albumin: 3.2 g/dL — ABNORMAL LOW (ref 3.5–5.0)
Alkaline Phosphatase: 165 U/L — ABNORMAL HIGH (ref 38–126)
Anion gap: 13 (ref 5–15)
BUN: 8 mg/dL (ref 8–23)
CO2: 34 mmol/L — ABNORMAL HIGH (ref 22–32)
CREATININE: 0.73 mg/dL (ref 0.44–1.00)
Calcium: 11.6 mg/dL — ABNORMAL HIGH (ref 8.9–10.3)
Chloride: 92 mmol/L — ABNORMAL LOW (ref 98–111)
GFR, Estimated: >60 mL/min (ref >60)
Glucose, Bld: 117 mg/dL — ABNORMAL HIGH (ref 70–99)
Potassium: 3.7 mmol/L (ref 3.5–5.1)
Sodium: 139 mmol/L (ref 135–145)
Total Bilirubin: 1.2 mg/dL (ref 0.3–1.2)
Total Protein: 7.8 g/dL (ref 6.5–8.1)

## 2018-09-05 LAB — CBC WITH DIFFERENTIAL (CANCER CENTER ONLY)
Abs Immature Granulocytes: 0.03 10*3/uL (ref 0.00–0.07)
Basophils Absolute: 0.1 10*3/uL (ref 0.0–0.1)
Basophils Relative: 1 %
Eosinophils Absolute: 0 10*3/uL (ref 0.0–0.5)
Eosinophils Relative: 0 %
HCT: 36.6 % (ref 36.0–46.0)
Hemoglobin: 12.7 g/dL (ref 12.0–15.0)
Immature Granulocytes: 0 %
Lymphocytes Relative: 19 %
Lymphs Abs: 1.3 10*3/uL (ref 0.7–4.0)
MCH: 38.6 pg — ABNORMAL HIGH (ref 26.0–34.0)
MCHC: 34.7 g/dL (ref 30.0–36.0)
MCV: 111.2 fL — AB (ref 80.0–100.0)
MONOS PCT: 15 %
Monocytes Absolute: 1 10*3/uL (ref 0.1–1.0)
Neutro Abs: 4.6 10*3/uL (ref 1.7–7.7)
Neutrophils Relative %: 65 %
Platelet Count: 211 10*3/uL (ref 150–400)
RBC: 3.29 MIL/uL — ABNORMAL LOW (ref 3.87–5.11)
RDW: 14.9 % (ref 11.5–15.5)
WBC Count: 7.1 10*3/uL (ref 4.0–10.5)
nRBC: 0 % (ref 0.0–0.2)

## 2018-09-05 NOTE — Patient Instructions (Signed)
Increase fentanyl patch to 2 (50 mcg) patches on Sunday (1/26) if needed.

## 2018-09-05 NOTE — Telephone Encounter (Signed)
Spoke with pt to follow up and see how she is doing with her pain control. Pt states that she is still having severe back and spinal pain from neck, shoulders and mid back. Per Dr.Gudena review, pt to see [facility] ([facility]) today and get evaluated. Set up labs and confirmed time/date to see SM. Labs placed prior to [facility] appt. Discussed pt symptoms with Lucianne Lei, [place] yesterday and is aware of pt possible appt today.

## 2018-09-08 ENCOUNTER — Telehealth: Payer: Self-pay

## 2018-09-08 NOTE — Telephone Encounter (Signed)
Returned patient's call. Left message for patient to return call.

## 2018-09-09 ENCOUNTER — Inpatient Hospital Stay: Payer: Medicare Other

## 2018-09-09 ENCOUNTER — Encounter: Payer: Self-pay | Admitting: Internal Medicine

## 2018-09-09 ENCOUNTER — Inpatient Hospital Stay: Payer: Self-pay | Attending: Hematology and Oncology

## 2018-09-09 ENCOUNTER — Inpatient Hospital Stay (HOSPITAL_BASED_OUTPATIENT_CLINIC_OR_DEPARTMENT_OTHER): Payer: Medicare Other | Admitting: Hematology and Oncology

## 2018-09-09 ENCOUNTER — Other Ambulatory Visit: Payer: Self-pay

## 2018-09-09 ENCOUNTER — Inpatient Hospital Stay (HOSPITAL_COMMUNITY)
Admission: AD | Admit: 2018-09-09 | Discharge: 2018-09-14 | DRG: 948 | Disposition: A | Discharge status: Hospice | Payer: Medicare Other | Source: Ambulatory Visit | Attending: Internal Medicine | Admitting: Internal Medicine

## 2018-09-09 ENCOUNTER — Telehealth: Payer: Self-pay | Admitting: *Deleted

## 2018-09-09 ENCOUNTER — Ambulatory Visit (HOSPITAL_COMMUNITY)
Admission: RE | Admit: 2018-09-09 | Discharge: 2018-09-09 | Disposition: A | Discharge status: Home / Self Care | Payer: Medicare Other | Source: Ambulatory Visit | Attending: Hematology and Oncology | Admitting: Hematology and Oncology

## 2018-09-09 ENCOUNTER — Encounter (HOSPITAL_COMMUNITY): Payer: Self-pay

## 2018-09-09 ENCOUNTER — Telehealth: Payer: Self-pay

## 2018-09-09 VITALS — BP 128/75 | HR 100 | Temp 98.4°F | Resp 18 | Ht 62.5 in | Wt 162.2 lb

## 2018-09-09 DIAGNOSIS — Z7189 Other specified counseling: Secondary | ICD-10-CM | POA: Diagnosis not present

## 2018-09-09 DIAGNOSIS — E785 Hyperlipidemia, unspecified: Secondary | ICD-10-CM | POA: Diagnosis present

## 2018-09-09 DIAGNOSIS — C7800 Secondary malignant neoplasm of unspecified lung: Secondary | ICD-10-CM

## 2018-09-09 DIAGNOSIS — C50511 Malignant neoplasm of lower-outer quadrant of right female breast: Secondary | ICD-10-CM | POA: Insufficient documentation

## 2018-09-09 DIAGNOSIS — Z170 Estrogen receptor positive status [ER+]: Secondary | ICD-10-CM | POA: Diagnosis not present

## 2018-09-09 DIAGNOSIS — M4850XA Collapsed vertebra, not elsewhere classified, site unspecified, initial encounter for fracture: Secondary | ICD-10-CM | POA: Diagnosis not present

## 2018-09-09 DIAGNOSIS — M2550 Pain in unspecified joint: Secondary | ICD-10-CM | POA: Diagnosis not present

## 2018-09-09 DIAGNOSIS — E876 Hypokalemia: Secondary | ICD-10-CM

## 2018-09-09 DIAGNOSIS — J90 Pleural effusion, not elsewhere classified: Secondary | ICD-10-CM

## 2018-09-09 DIAGNOSIS — Z955 Presence of coronary angioplasty implant and graft: Secondary | ICD-10-CM

## 2018-09-09 DIAGNOSIS — C7951 Secondary malignant neoplasm of bone: Secondary | ICD-10-CM

## 2018-09-09 DIAGNOSIS — Z7951 Long term (current) use of inhaled steroids: Secondary | ICD-10-CM

## 2018-09-09 DIAGNOSIS — Z9221 Personal history of antineoplastic chemotherapy: Secondary | ICD-10-CM | POA: Insufficient documentation

## 2018-09-09 DIAGNOSIS — Z923 Personal history of irradiation: Secondary | ICD-10-CM | POA: Diagnosis not present

## 2018-09-09 DIAGNOSIS — R0602 Shortness of breath: Secondary | ICD-10-CM | POA: Diagnosis not present

## 2018-09-09 DIAGNOSIS — G4700 Insomnia, unspecified: Secondary | ICD-10-CM | POA: Diagnosis not present

## 2018-09-09 DIAGNOSIS — M84550A Pathological fracture in neoplastic disease, pelvis, initial encounter for fracture: Secondary | ICD-10-CM | POA: Diagnosis present

## 2018-09-09 DIAGNOSIS — Z515 Encounter for palliative care: Secondary | ICD-10-CM | POA: Diagnosis present

## 2018-09-09 DIAGNOSIS — K219 Gastro-esophageal reflux disease without esophagitis: Secondary | ICD-10-CM | POA: Diagnosis not present

## 2018-09-09 DIAGNOSIS — Z79891 Long term (current) use of opiate analgesic: Secondary | ICD-10-CM | POA: Diagnosis not present

## 2018-09-09 DIAGNOSIS — Z9011 Acquired absence of right breast and nipple: Secondary | ICD-10-CM | POA: Insufficient documentation

## 2018-09-09 DIAGNOSIS — I10 Essential (primary) hypertension: Secondary | ICD-10-CM | POA: Diagnosis not present

## 2018-09-09 DIAGNOSIS — K210 Gastro-esophageal reflux disease with esophagitis: Secondary | ICD-10-CM | POA: Diagnosis not present

## 2018-09-09 DIAGNOSIS — C50919 Malignant neoplasm of unspecified site of unspecified female breast: Secondary | ICD-10-CM | POA: Diagnosis present

## 2018-09-09 DIAGNOSIS — Z853 Personal history of malignant neoplasm of breast: Secondary | ICD-10-CM | POA: Diagnosis not present

## 2018-09-09 DIAGNOSIS — R112 Nausea with vomiting, unspecified: Secondary | ICD-10-CM | POA: Diagnosis not present

## 2018-09-09 DIAGNOSIS — G893 Neoplasm related pain (acute) (chronic): Secondary | ICD-10-CM | POA: Diagnosis not present

## 2018-09-09 DIAGNOSIS — Z79811 Long term (current) use of aromatase inhibitors: Secondary | ICD-10-CM | POA: Insufficient documentation

## 2018-09-09 DIAGNOSIS — K5900 Constipation, unspecified: Secondary | ICD-10-CM | POA: Diagnosis not present

## 2018-09-09 DIAGNOSIS — J449 Chronic obstructive pulmonary disease, unspecified: Secondary | ICD-10-CM | POA: Diagnosis present

## 2018-09-09 DIAGNOSIS — S22070A Wedge compression fracture of T9-T10 vertebra, initial encounter for closed fracture: Secondary | ICD-10-CM | POA: Diagnosis not present

## 2018-09-09 DIAGNOSIS — M8458XA Pathological fracture in neoplastic disease, other specified site, initial encounter for fracture: Secondary | ICD-10-CM | POA: Diagnosis present

## 2018-09-09 DIAGNOSIS — R531 Weakness: Secondary | ICD-10-CM | POA: Diagnosis not present

## 2018-09-09 DIAGNOSIS — Z803 Family history of malignant neoplasm of breast: Secondary | ICD-10-CM

## 2018-09-09 DIAGNOSIS — R52 Pain, unspecified: Secondary | ICD-10-CM

## 2018-09-09 DIAGNOSIS — R0902 Hypoxemia: Secondary | ICD-10-CM | POA: Diagnosis not present

## 2018-09-09 DIAGNOSIS — C787 Secondary malignant neoplasm of liver and intrahepatic bile duct: Secondary | ICD-10-CM | POA: Insufficient documentation

## 2018-09-09 DIAGNOSIS — R590 Localized enlarged lymph nodes: Secondary | ICD-10-CM | POA: Diagnosis not present

## 2018-09-09 DIAGNOSIS — S22000A Wedge compression fracture of unspecified thoracic vertebra, initial encounter for closed fracture: Secondary | ICD-10-CM | POA: Diagnosis not present

## 2018-09-09 DIAGNOSIS — Z8249 Family history of ischemic heart disease and other diseases of the circulatory system: Secondary | ICD-10-CM | POA: Diagnosis not present

## 2018-09-09 DIAGNOSIS — Z66 Do not resuscitate: Secondary | ICD-10-CM | POA: Diagnosis not present

## 2018-09-09 DIAGNOSIS — Z7401 Bed confinement status: Secondary | ICD-10-CM | POA: Diagnosis not present

## 2018-09-09 DIAGNOSIS — I2510 Atherosclerotic heart disease of native coronary artery without angina pectoris: Secondary | ICD-10-CM | POA: Diagnosis present

## 2018-09-09 DIAGNOSIS — T451X5A Adverse effect of antineoplastic and immunosuppressive drugs, initial encounter: Secondary | ICD-10-CM

## 2018-09-09 LAB — CBC WITH DIFFERENTIAL (CANCER CENTER ONLY)
Abs Immature Granulocytes: 0.02 10*3/uL (ref 0.00–0.07)
Basophils Absolute: 0 10*3/uL (ref 0.0–0.1)
Basophils Relative: 1 %
Eosinophils Absolute: 0 10*3/uL (ref 0.0–0.5)
Eosinophils Relative: 0 %
HCT: 36.3 % (ref 36.0–46.0)
Hemoglobin: 12.2 g/dL (ref 12.0–15.0)
Immature Granulocytes: 0 %
Lymphocytes Relative: 15 %
Lymphs Abs: 1 10*3/uL (ref 0.7–4.0)
MCH: 37.7 pg — ABNORMAL HIGH (ref 26.0–34.0)
MCHC: 33.6 g/dL (ref 30.0–36.0)
MCV: 112 fL — ABNORMAL HIGH (ref 80.0–100.0)
MONOS PCT: 16 %
Monocytes Absolute: 1 10*3/uL (ref 0.1–1.0)
Neutro Abs: 4.5 10*3/uL (ref 1.7–7.7)
Neutrophils Relative %: 68 %
Platelet Count: 220 10*3/uL (ref 150–400)
RBC: 3.24 MIL/uL — ABNORMAL LOW (ref 3.87–5.11)
RDW: 14.8 % (ref 11.5–15.5)
WBC Count: 6.5 10*3/uL (ref 4.0–10.5)
nRBC: 0 % (ref 0.0–0.2)

## 2018-09-09 LAB — CMP (CANCER CENTER ONLY)
ALT: 29 U/L (ref 0–44)
AST: 76 U/L — ABNORMAL HIGH (ref 15–41)
Albumin: 3 g/dL — ABNORMAL LOW (ref 3.5–5.0)
Alkaline Phosphatase: 171 U/L — ABNORMAL HIGH (ref 38–126)
Anion gap: 15 (ref 5–15)
BUN: 9 mg/dL (ref 8–23)
CO2: 36 mmol/L — ABNORMAL HIGH (ref 22–32)
Calcium: 11.3 mg/dL — ABNORMAL HIGH (ref 8.9–10.3)
Chloride: 88 mmol/L — ABNORMAL LOW (ref 98–111)
Creatinine: 0.76 mg/dL (ref 0.44–1.00)
GFR, Est AFR Am: >60 mL/min (ref >60)
Glucose, Bld: 142 mg/dL — ABNORMAL HIGH (ref 70–99)
POTASSIUM: 3 mmol/L — AB (ref 3.5–5.1)
Sodium: 139 mmol/L (ref 135–145)
Total Bilirubin: 1.1 mg/dL (ref 0.3–1.2)
Total Protein: 7.5 g/dL (ref 6.5–8.1)

## 2018-09-09 MED ORDER — LORAZEPAM 1 MG PO TABS
1.0000 mg | ORAL_TABLET | Freq: Every day | ORAL | Status: DC
  Administered 2018-09-10: 1 mg via ORAL
  Administered 2018-09-11 – 2018-09-13 (×3): 2 mg via ORAL
  Filled 2018-09-09: qty 1
  Filled 2018-09-09 (×5): qty 2
  Filled 2018-09-09: qty 1
  Filled 2018-09-09: qty 2

## 2018-09-09 MED ORDER — HYDROMORPHONE HCL 1 MG/ML IJ SOLN: 2.0000 mg | Freq: Once | INTRAMUSCULAR | Status: DC

## 2018-09-09 MED ORDER — DIPHENHYDRAMINE HCL 12.5 MG/5ML PO ELIX: 12.5000 mg | ORAL_SOLUTION | Freq: Four times a day (QID) | ORAL | Status: DC | PRN

## 2018-09-09 MED ORDER — HYDROMORPHONE HCL 2 MG/ML IJ SOLN
2.0000 mg | Freq: Once | INTRAMUSCULAR | Status: AC
  Administered 2018-09-09: 2 mg via INTRAVENOUS
  Filled 2018-09-09: qty 1

## 2018-09-09 MED ORDER — ENOXAPARIN SODIUM 40 MG/0.4ML ~~LOC~~ SOLN
40.0000 mg | SUBCUTANEOUS | Status: DC
  Filled 2018-09-09: qty 0.4

## 2018-09-09 MED ORDER — LORATADINE 10 MG PO TABS
10.0000 mg | ORAL_TABLET | Freq: Every day | ORAL | Status: DC
  Administered 2018-09-13 – 2018-09-14 (×2): 10 mg via ORAL
  Filled 2018-09-09 (×6): qty 1

## 2018-09-09 MED ORDER — IOHEXOL 300 MG/ML  SOLN
100.0000 mL | Freq: Once | INTRAMUSCULAR | Status: AC | PRN
  Administered 2018-09-09: 100 mL via INTRAVENOUS

## 2018-09-09 MED ORDER — HYDROMORPHONE 1 MG/ML IV SOLN
INTRAVENOUS | Status: DC
  Administered 2018-09-09: 23:00:00 via INTRAVENOUS
  Administered 2018-09-10: 1.2 mg via INTRAVENOUS
  Administered 2018-09-10: 0.5 mg via INTRAVENOUS
  Filled 2018-09-09: qty 30

## 2018-09-09 MED ORDER — POTASSIUM CHLORIDE IN NACL 20-0.45 MEQ/L-% IV SOLN
INTRAVENOUS | Status: DC
  Administered 2018-09-09 – 2018-09-11 (×3): via INTRAVENOUS
  Filled 2018-09-09 (×8): qty 1000

## 2018-09-09 MED ORDER — HYDROMORPHONE HCL 2 MG/ML IJ SOLN
INTRAMUSCULAR | Status: AC
  Filled 2018-09-09: qty 1

## 2018-09-09 MED ORDER — FENTANYL 50 MCG/HR TD PT72: 1.0000 | MEDICATED_PATCH | TRANSDERMAL | Status: DC

## 2018-09-09 MED ORDER — HYDROMORPHONE HCL 2 MG/ML IJ SOLN
2.0000 mg | INTRAMUSCULAR | Status: DC | PRN
  Administered 2018-09-09 (×3): 2 mg via INTRAVENOUS

## 2018-09-09 MED ORDER — PROCHLORPERAZINE EDISYLATE 10 MG/2ML IJ SOLN
10.0000 mg | Freq: Two times a day (BID) | INTRAMUSCULAR | Status: DC | PRN
  Administered 2018-09-10: 10 mg via INTRAVENOUS
  Filled 2018-09-09: qty 2

## 2018-09-09 MED ORDER — ACETAMINOPHEN 650 MG RE SUPP: 650.0000 mg | Freq: Four times a day (QID) | RECTAL | Status: DC | PRN

## 2018-09-09 MED ORDER — NALOXONE HCL 0.4 MG/ML IJ SOLN: 0.4000 mg | INTRAMUSCULAR | Status: DC | PRN

## 2018-09-09 MED ORDER — HYDROMORPHONE HCL 1 MG/ML IJ SOLN
1.0000 mg | INTRAMUSCULAR | Status: DC | PRN
  Administered 2018-09-09: 1 mg via INTRAVENOUS
  Filled 2018-09-09 (×2): qty 1

## 2018-09-09 MED ORDER — ONDANSETRON HCL 4 MG/2ML IJ SOLN: 4.0000 mg | Freq: Four times a day (QID) | INTRAMUSCULAR | Status: DC | PRN

## 2018-09-09 MED ORDER — HYDROMORPHONE HCL 4 MG/ML IJ SOLN
2.0000 mg | INTRAMUSCULAR | Status: DC | PRN
  Filled 2018-09-09: qty 1

## 2018-09-09 MED ORDER — SODIUM CHLORIDE 0.9% FLUSH: 9.0000 mL | INTRAVENOUS | Status: DC | PRN

## 2018-09-09 MED ORDER — DIPHENHYDRAMINE HCL 50 MG/ML IJ SOLN: 12.5000 mg | Freq: Four times a day (QID) | INTRAMUSCULAR | Status: DC | PRN

## 2018-09-09 MED ORDER — LORAZEPAM 2 MG/ML IJ SOLN
1.0000 mg | Freq: Once | INTRAMUSCULAR | Status: AC
  Administered 2018-09-09: 1 mg via INTRAVENOUS

## 2018-09-09 MED ORDER — PROCHLORPERAZINE MALEATE 10 MG PO TABS: 5.0000 mg | ORAL_TABLET | Freq: Four times a day (QID) | ORAL | Status: DC | PRN

## 2018-09-09 MED ORDER — ACETAMINOPHEN 325 MG PO TABS: 650.0000 mg | ORAL_TABLET | Freq: Four times a day (QID) | ORAL | Status: DC | PRN

## 2018-09-09 MED ORDER — PROCHLORPERAZINE 25 MG RE SUPP
25.0000 mg | Freq: Two times a day (BID) | RECTAL | Status: DC | PRN
  Filled 2018-09-09: qty 1

## 2018-09-09 MED ORDER — MILK AND MOLASSES ENEMA
1.0000 | Freq: Once | RECTAL | Status: AC | PRN
  Administered 2018-09-10: 250 mL via RECTAL
  Filled 2018-09-09 (×2): qty 250

## 2018-09-09 MED ORDER — ALBUTEROL SULFATE (2.5 MG/3ML) 0.083% IN NEBU
2.5000 mg | INHALATION_SOLUTION | RESPIRATORY_TRACT | Status: DC | PRN
  Administered 2018-09-10 – 2018-09-14 (×4): 2.5 mg via RESPIRATORY_TRACT
  Filled 2018-09-09 (×4): qty 3

## 2018-09-09 MED ORDER — SODIUM CHLORIDE (PF) 0.9 % IJ SOLN
INTRAMUSCULAR | Status: AC
  Filled 2018-09-09: qty 50

## 2018-09-09 MED ORDER — LORAZEPAM 2 MG/ML IJ SOLN
INTRAMUSCULAR | Status: AC
  Filled 2018-09-09: qty 1

## 2018-09-09 MED ORDER — HYDROMORPHONE HCL 4 MG/ML IJ SOLN
2.0000 mg | Freq: Once | INTRAMUSCULAR | Status: DC
  Administered 2018-09-09: 2 mg via INTRAVENOUS

## 2018-09-09 MED ORDER — POTASSIUM CHLORIDE IN NACL 20-0.9 MEQ/L-% IV SOLN
Freq: Once | INTRAVENOUS | Status: DC
  Filled 2018-09-09: qty 1000

## 2018-09-09 MED ORDER — POTASSIUM CHLORIDE IN NACL 20-0.9 MEQ/L-% IV SOLN
Freq: Once | INTRAVENOUS | Status: AC
  Administered 2018-09-09: 16:00:00 via INTRAVENOUS
  Filled 2018-09-09: qty 1000

## 2018-09-09 MED ORDER — PROMETHAZINE HCL 25 MG PO TABS: 25.0000 mg | ORAL_TABLET | Freq: Four times a day (QID) | ORAL | Status: DC | PRN

## 2018-09-09 MED ORDER — MAGNESIUM CITRATE PO SOLN: 0.5000 | Freq: Once | ORAL | Status: DC | PRN
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
## 2018-09-09 NOTE — Telephone Encounter (Signed)
This RN received pt back from CT-at 1320 via wheelchair with husband and 2 techs .  Pt put in MD room to await bed in treatment room - for holding until bed availability for admission.  Per inquiry pt's pain post scan has returned to " about an 8 or 9 "  Hydromorphone 2 mg IV  repeated at 1338.  Per transfer to treatment room - with pt stating pain presently at "5" with some noted guarding in body position- order obtained for ativan IV and if pain not relieved - may repeat hydromorphone.  Pt assisted into bed with side rails up x 2 and bed in low position.  Ativan 1 mg IV given at 1440.  Report given to attending RN in the treatment room.

## 2018-09-09 NOTE — Telephone Encounter (Signed)
[place]. Tech call report.  Today's K+  = 3.0 mmol/L.  Message left for nurse.  Noted patient F/U visit today.  Spoke with provider with results.

## 2018-09-09 NOTE — Telephone Encounter (Signed)
Obtained bed assignment at 6pm for 1610.  Pt transported via wheelchair with O2 via pt's concentrator at 3L/min via [place].  Accompanied by husband with all belongings verified as obtained upon leaving cancer center.  Pt assisted into bed with side rails up x 2 and bed in low position.  Report given to inpt nurse.  No futher questions or needs for this RN.  Transfer of care complete.

## 2018-09-09 NOTE — Progress Notes (Signed)
AMBULATED PT TO BEDSIDE COMMODE, PT HAD LABORED BREATHING ON 3L OF O2. ASKED PT IF SHE COULD USE BEDSIDE COMMODE FOR THE REST OF NIGHT. PT INSISTED THAT SHE WOULD WALK TO THE BATHROOM.

## 2018-09-09 NOTE — H&P (Signed)
History and Physical    Suzanne [name] QMG:[id number] DOB: Dec 14, 1955 DOA: 09/09/2018  PCP: Unk Pinto, MD   Patient coming from: Cancer center.  I have personally briefly reviewed patient's old medical records in [place]  Chief Complaint: Back pain.  HPI: Suzanne [name] is a 63 y.o. female with medical history significant of EtOH abuse, metastatic breast cancer, depression, COPD, CAD, history of stent placement, hyperlipidemia, hypertension, insomnia who is referred from the cancer center due to progressively worse back pain since last week after the patient had a fall while trying going to the bathroom in her house at night with very daily illumination.  She denies head trauma or LOC.  No fever, chills, rhinorrhea, sore throat, chest pain, dyspnea, hemoptysis, no recent or current lower extremity edema, abdominal pain, emesis, diarrhea, melena or hematochezia.  She is currently constipated.  She gets frequent nausea.  She denies dysuria, frequency or hematuria.  No polyuria, polydipsia, polyphagia or blurred vision.  No skin rashes or pruritus.  Initial vital signs on arrival to the floor temperature 98 F, pulse 99, respiration 18, blood pressure 123/90 and O2 sat 96% on nasal cannula oxygen.  Review of Systems: As per HPI otherwise 10 point review of systems negative.   Past Medical History:  Diagnosis Date  . Alcoholism ([place])   . Cancer [facility])    breast ca - 1994; recurred in 2007. s/p masectomy with flap rconstruction aand chemo '94. local recurrence on chest wall. chemo and XRT '07. now femara  . Depression   . Dyspnea    myoview 2011: EF 55% question of  mild reverible anterior defect. thought to be breast attenuation. echo 45-50% with global HK. Grade 1 diastolyic dysfunction. RV nml. cardiac MRI with EF 44% with septal HK. no scar   . History of coronary artery stent placement   . Hyperlipidemia   . Hypertension   . Insomnia   . Lung disorder     Past  Surgical History:  Procedure Laterality Date  . CARDIAC CATHETERIZATION     [facility]  . MASTECTOMY    . ORIF DISTAL RADIUS FRACTURE    . PORT-A-CATH REMOVAL  07/29/2012   Procedure: REMOVAL PORT-A-CATH;  Surgeon: Haywood Lasso, MD;  Location: [facility];  Service: General;  Laterality: Left;  . R masectomy  '94   . R transflap  '94     reports that she has never smoked. She has never used smokeless tobacco. She reports current alcohol use. She reports that she does not use drugs.  Allergies  Allergen Reactions  . Ace Inhibitors     cough    Family History  Problem Relation Age of Onset  . Hypertension Mother   . Heart attack Father   . Alcohol abuse Father   . Coronary artery disease Other        family hx  . Hyperlipidemia Other        family hx  . Thyroid disease Other        family hx   . Alcohol abuse Paternal Grandfather   . Alcohol abuse Paternal Uncle   . Cancer Paternal Grandmother        breast  . Alcohol abuse Brother    Prior to Admission medications   Medication Sig Start Date End Date Taking? Authorizing Provider  albuterol (PROAIR HFA) 108 (90 Base) MCG/ACT inhaler INHALE 1 PUFF BY MOUTH EVERY 6 HOURS AS NEEDED FOR WHEEZING OR SHORTNESS OF  BREATH 01/30/18  Yes Nicholas Lose, MD  albuterol (PROVENTIL) (2.5 MG/3ML) 0.083% nebulizer solution INHALE 1 VIAL VIA NEBULIZER EVERY 6 HOURS AS NEEDED FOR WHEEZING OR SHORTNESS OF BREATH 09/09/17  Yes Nicholas Lose, MD  calcium carbonate (OS-CAL - DOSED IN MG OF ELEMENTAL CALCIUM) 1250 (500 Ca) MG tablet Take 1 tablet by mouth.   Yes [provider]  cholecalciferol (VITAMIN D) 1000 units tablet Take 1,000 Units by mouth daily.   Yes [provider]  fentaNYL (DURAGESIC - DOSED MCG/HR) 50 MCG/HR Place 1 patch (50 mcg total) onto the skin every 3 (three) days. 08/25/18  Yes Nicholas Lose, MD  furosemide (LASIX) 40 MG tablet Take 1 tablet (40 mg total) by mouth daily. 03/13/18 03/13/19  Yes Unk Pinto, MD  HYDROmorphone (DILAUDID) 4 MG tablet TAKE 1 TABLET (2 MG TOTAL) BY MOUTH EVERY 4 HOURS AS NEEDED FOR SEVERE PAIN 08/25/18  Yes Nicholas Lose, MD  ibuprofen (ADVIL,MOTRIN) 200 MG tablet Take 200 mg by mouth every 6 (six) hours as needed.   Yes [provider]  loratadine (CLARITIN) 10 MG tablet Take 1 tablet (10 mg total) by mouth daily. 06/06/17  Yes Collene Gobble, MD  LORazepam (ATIVAN) 2 MG tablet TAKE 1/2 TO 1 TABLET BY MOUTH AT BEDTIME 08/25/18  Yes Nicholas Lose, MD  ondansetron (ZOFRAN) 8 MG tablet Take 1 tablet 3 x /day for Nausea 02/23/18  Yes Unk Pinto, MD  potassium chloride SA (K-DUR,KLOR-CON) 20 MEQ tablet Take 1 tablet twice daily. 03/27/18  Yes Unk Pinto, MD  promethazine (PHENERGAN) 25 MG tablet TAKE 1 TABLET BY MOUTH EVERY 6 HOURS AS NEEDED FOR NAUSEA. 08/25/18  Yes Nicholas Lose, MD  TRELEGY ELLIPTA 100-62.5-25 MCG/INH AEPB INHALE 1 PUFF INTO THE LUNGS DAILY. 06/26/18  Yes Collene Gobble, MD  bisoprolol (ZEBETA) 5 MG tablet TAKE 1 TABLET BY MOUTH ONCE DAILY. PATIENT NEEDS APPOINTMENT FOR FURTHER REFILLS. Patient not taking: Reported on 09/05/2018 05/20/18   Wellington Hampshire, MD  blood glucose meter kit and supplies KIT Dispense based on patient and insurance preference. Use up to four times daily as directed. (FOR ICD-9 250.00, 250.01). 02/05/18   Nicholas Lose, MD  fluticasone (FLONASE) 50 MCG/ACT nasal spray Place 2 sprays into both nostrils daily. Patient not taking: Reported on 09/09/2018 06/06/17   Collene Gobble, MD  prochlorperazine (COMPAZINE) 10 MG tablet Take 1 tablet (10 mg total) by mouth every 6 (six) hours as needed for nausea or vomiting. Patient not taking: Reported on 09/09/2018 01/02/18   Nicholas Lose, MD    Physical Exam: Vitals:   09/09/18 1814 09/09/18 1855  BP: 123/90 123/90  Pulse: 99 99  Resp: 18 18  Temp: 98 F (36.7 C) 98 F (36.7 C)  TempSrc: Oral Oral  SpO2: 96%   Weight:  73.6 kg  Height:  5' 2.5"  (1.588 m)    Constitutional: Looks chronically ill, but in NAD, calm, comfortable Eyes: PERRL, lids and conjunctivae are mildly pale. ENMT: Mucous membranes are mildly dry. Posterior pharynx clear of any exudate or lesions. Neck: normal, supple, no masses, no thyromegaly Respiratory: clear to auscultation bilaterally, no wheezing, no crackles. Normal respiratory effort. No accessory muscle use.  Cardiovascular: Regular rate and rhythm, no murmurs / rubs / gallops. No extremity edema. 2+ pedal pulses. No carotid bruits.  Abdomen: Soft, no tenderness, no masses palpated. No hepatosplenomegaly. Bowel sounds positive.  Musculoskeletal: no clubbing / cyanosis. Good ROM, no contractures. Normal muscle tone.  Skin: no rashes,  lesions, ulcers. No induration on limited dermatological examination. Neurologic: CN 2-12 grossly intact. Sensation intact, DTR normal. Strength 5/5 in all 4.  Psychiatric: Normal judgment and insight. Alert and oriented x 3. Normal mood.   Labs on Admission: I have personally reviewed following labs and imaging studies  CBC: Recent Labs  Lab 09/05/18 1101 09/09/18 1059  WBC 7.1 6.5  NEUTROABS 4.6 4.5  HGB 12.7 12.2  HCT 36.6 36.3  MCV 111.2* 112.0*  PLT 211 782   Basic Metabolic Panel: Recent Labs  Lab 09/05/18 1101 09/09/18 1059  NA 139 139  K 3.7 3.0*  CL 92* 88*  CO2 34* 36*  GLUCOSE 117* 142*  BUN 8 9  CREATININE 0.73 0.76  CALCIUM 11.6* 11.3*   GFR: Estimated Creatinine Clearance: 70.2 mL/min (by C-G formula based on SCr of 0.76 mg/dL). Liver Function Tests: Recent Labs  Lab 09/05/18 1101 09/09/18 1059  AST 75* 76*  ALT 30 29  ALKPHOS 165* 171*  BILITOT 1.2 1.1  PROT 7.8 7.5  ALBUMIN 3.2* 3.0*   No results for input(s): LIPASE, AMYLASE in the last 168 hours. No results for input(s): AMMONIA in the last 168 hours. Coagulation Profile: No results for input(s): INR, PROTIME in the last 168 hours. Cardiac Enzymes: No results for input(s):  CKTOTAL, CKMB, CKMBINDEX, TROPONINI in the last 168 hours. BNP (last 3 results) No results for input(s): PROBNP in the last 8760 hours. HbA1C: No results for input(s): HGBA1C in the last 72 hours. CBG: No results for input(s): GLUCAP in the last 168 hours. Lipid Profile: No results for input(s): CHOL, HDL, LDLCALC, TRIG, CHOLHDL, LDLDIRECT in the last 72 hours. Thyroid Function Tests: No results for input(s): TSH, T4TOTAL, FREET4, T3FREE, THYROIDAB in the last 72 hours. Anemia Panel: No results for input(s): VITAMINB12, FOLATE, FERRITIN, TIBC, IRON, RETICCTPCT in the last 72 hours. Urine analysis:    Component Value Date/Time   COLORURINE DARK YELLOW 02/10/2018 1537   APPEARANCEUR TURBID (A) 02/10/2018 1537   APPEARANCEUR Clear 12/07/2013 1843   LABSPEC 1.024 02/10/2018 1537   LABSPEC 1.003 12/07/2013 1843   LABSPEC 1.020 06/27/2006 1640   PHURINE 5.5 02/10/2018 1537   GLUCOSEU 2+ (A) 02/10/2018 1537   GLUCOSEU Negative 12/07/2013 1843   HGBUR NEGATIVE 02/10/2018 1537   BILIRUBINUR NEGATIVE 08/29/2015 1517   BILIRUBINUR Negative 12/07/2013 1843   BILIRUBINUR Negative 06/27/2006 1640   KETONESUR 1+ (A) 02/10/2018 1537   PROTEINUR TRACE (A) 02/10/2018 1537   UROBILINOGEN 0.2 08/26/2014 1759   NITRITE NEGATIVE 08/29/2015 1517   LEUKOCYTESUR NEGATIVE 08/29/2015 1517   LEUKOCYTESUR Negative 12/07/2013 1843   LEUKOCYTESUR Negative 06/27/2006 1640    Radiological Exams on Admission: Ct Chest W Contrast  Result Date: 09/09/2018 CLINICAL DATA:  Metastatic breast cancer currently on Xeloda palliative chemotherapy. Worsening dyspnea. Bone pain. EXAM: CT CHEST, ABDOMEN, AND PELVIS WITH CONTRAST TECHNIQUE: Multidetector CT imaging of the chest, abdomen and pelvis was performed following the standard protocol during bolus administration of intravenous contrast. CONTRAST:  1102m OMNIPAQUE IOHEXOL 300 MG/ML  SOLN COMPARISON:  PET-CT 07/28/2018 FINDINGS: CT CHEST FINDINGS Cardiovascular:  Unremarkable Mediastinum/Nodes: Left infrahilar node or nodule measuring 1.9 by 1.9 cm on image 25/2 is observed. There were previously smaller hypermetabolic lymph nodes in this vicinity on 07/28/2018. Lungs/Pleura: Moderate right pleural effusion without a well-defined pleural mass, but potentially with some mild loculation. Considerable atelectatic portions of the right middle lobe and right lower lobe which previously had hypermetabolic elements compatible with underlying metastatic lesions. A left  lower lobe nodule was previously hypermetabolic and currently measures 1.2 cm in short axis thickness on image 83/6, formerly the same on prior PET-CT where the lesion was hypermetabolic. Trace left pleural effusion. Musculoskeletal: Widespread osseous metastatic disease is primarily sclerotic. Compared to 03/31/2018, there is a new 40% compression fracture at T9 and a new 15% superior endplate compression fracture at T11. Old healed sternal fracture. Primarily similar distribution of osseous metastatic disease compared to previous. Reconstruction related findings in the right breast, including a fluid collection along the right pectoralis muscle. CT ABDOMEN PELVIS FINDINGS Hepatobiliary: Scattered hepatic metastatic lesions are similar in distribution to the 07/28/2018 exam, but increased from the 03/31/2018 diagnostic CT examination. Cholelithiasis present potentially with mild gallbladder wall thickening. Extrahepatic biliary dilatation with CBD at 1.3 cm. This is significantly increased from 07/28/2018, raise the possibility of choledocholithiasis. No overt intrahepatic biliary dilatation. An index segment 4 enhancing metastatic lesion measures 1.5 by 1.5 cm on image 34/2. Pancreas: Unremarkable Spleen: Unremarkable Adrenals/Urinary Tract: Unremarkable Stomach/Bowel: Unremarkable Vascular/Lymphatic: Mild aortoiliac atherosclerotic vascular calcification. Reproductive: Uterus absent.  Adnexa unremarkable. Other: No  supplemental non-categorized findings. Musculoskeletal: Prior left tram flap procedure. Worsening metastatic disease in the left ischium with periostitis and suspicion for a small nondisplaced pathologic fracture of the left ischium on image 116/2. Worsening lytic lesions of the right superior pubic ramus and anterior wall of the right acetabulum. New 1.7 cm lytic lesion of the left femoral head. Widespread and increased sclerotic osseous metastatic disease in the bony pelvis. New periosteal reaction along the posterior left iliac bone likely related to a lytic mass on image 85/2. Metastatic burden in the lumbar spine has likewise mildly increased. New superior endplate compression fracture and inferior endplate compression fracture at the L4 level. IMPRESSION: 1. Worsening bony metastatic disease especially perceptible in the pelvis and lumbar spine. New compression fractures at T9, T11, and L4. 2. Probable pathologic fracture in the left ischium, nondisplaced. Worsening lytic metastatic lesions of the right superior pubic ramus and anterior wall of the right acetabulum as well as the left femoral head. 3. Enlarging left infrahilar mass/conglomerate adenopathy, 1.9 cm in diameter. Stable previously hypermetabolic left lower lobe pulmonary nodule. 4. Stable distribution of scattered hepatic metastatic lesions compared to the prior MRI. 5. Considerable atelectatic portions the right middle lobe and right lower lobe due to the right pleural effusion. These atelectatic regions were shown to harbor metastatic lesions on the prior PET-CT. 6. New extrahepatic biliary dilatation up to 1.3 cm. There is cholelithiasis and strictly speaking I can not exclude choledocholithiasis as a potential cause. Hepatobiliary sonography may be warranted. 7. Other imaging findings of potential clinical significance: Moderate right and trace left pleural effusion. Aortic Atherosclerosis (ICD10-I70.0). Electronically Signed   By: Van Clines M.D.   On: 09/09/2018 14:14   Ct Abdomen Pelvis W Contrast  Result Date: 09/09/2018 CLINICAL DATA:  Metastatic breast cancer currently on Xeloda palliative chemotherapy. Worsening dyspnea. Bone pain. EXAM: CT CHEST, ABDOMEN, AND PELVIS WITH CONTRAST TECHNIQUE: Multidetector CT imaging of the chest, abdomen and pelvis was performed following the standard protocol during bolus administration of intravenous contrast. CONTRAST:  160m OMNIPAQUE IOHEXOL 300 MG/ML  SOLN COMPARISON:  PET-CT 07/28/2018 FINDINGS: CT CHEST FINDINGS Cardiovascular: Unremarkable Mediastinum/Nodes: Left infrahilar node or nodule measuring 1.9 by 1.9 cm on image 25/2 is observed. There were previously smaller hypermetabolic lymph nodes in this vicinity on 07/28/2018. Lungs/Pleura: Moderate right pleural effusion without a well-defined pleural mass, but potentially with some mild loculation. Considerable  atelectatic portions of the right middle lobe and right lower lobe which previously had hypermetabolic elements compatible with underlying metastatic lesions. A left lower lobe nodule was previously hypermetabolic and currently measures 1.2 cm in short axis thickness on image 83/6, formerly the same on prior PET-CT where the lesion was hypermetabolic. Trace left pleural effusion. Musculoskeletal: Widespread osseous metastatic disease is primarily sclerotic. Compared to 03/31/2018, there is a new 40% compression fracture at T9 and a new 15% superior endplate compression fracture at T11. Old healed sternal fracture. Primarily similar distribution of osseous metastatic disease compared to previous. Reconstruction related findings in the right breast, including a fluid collection along the right pectoralis muscle. CT ABDOMEN PELVIS FINDINGS Hepatobiliary: Scattered hepatic metastatic lesions are similar in distribution to the 07/28/2018 exam, but increased from the 03/31/2018 diagnostic CT examination. Cholelithiasis present potentially  with mild gallbladder wall thickening. Extrahepatic biliary dilatation with CBD at 1.3 cm. This is significantly increased from 07/28/2018, raise the possibility of choledocholithiasis. No overt intrahepatic biliary dilatation. An index segment 4 enhancing metastatic lesion measures 1.5 by 1.5 cm on image 34/2. Pancreas: Unremarkable Spleen: Unremarkable Adrenals/Urinary Tract: Unremarkable Stomach/Bowel: Unremarkable Vascular/Lymphatic: Mild aortoiliac atherosclerotic vascular calcification. Reproductive: Uterus absent.  Adnexa unremarkable. Other: No supplemental non-categorized findings. Musculoskeletal: Prior left tram flap procedure. Worsening metastatic disease in the left ischium with periostitis and suspicion for a small nondisplaced pathologic fracture of the left ischium on image 116/2. Worsening lytic lesions of the right superior pubic ramus and anterior wall of the right acetabulum. New 1.7 cm lytic lesion of the left femoral head. Widespread and increased sclerotic osseous metastatic disease in the bony pelvis. New periosteal reaction along the posterior left iliac bone likely related to a lytic mass on image 85/2. Metastatic burden in the lumbar spine has likewise mildly increased. New superior endplate compression fracture and inferior endplate compression fracture at the L4 level. IMPRESSION: 1. Worsening bony metastatic disease especially perceptible in the pelvis and lumbar spine. New compression fractures at T9, T11, and L4. 2. Probable pathologic fracture in the left ischium, nondisplaced. Worsening lytic metastatic lesions of the right superior pubic ramus and anterior wall of the right acetabulum as well as the left femoral head. 3. Enlarging left infrahilar mass/conglomerate adenopathy, 1.9 cm in diameter. Stable previously hypermetabolic left lower lobe pulmonary nodule. 4. Stable distribution of scattered hepatic metastatic lesions compared to the prior MRI. 5. Considerable atelectatic  portions the right middle lobe and right lower lobe due to the right pleural effusion. These atelectatic regions were shown to harbor metastatic lesions on the prior PET-CT. 6. New extrahepatic biliary dilatation up to 1.3 cm. There is cholelithiasis and strictly speaking I can not exclude choledocholithiasis as a potential cause. Hepatobiliary sonography may be warranted. 7. Other imaging findings of potential clinical significance: Moderate right and trace left pleural effusion. Aortic Atherosclerosis (ICD10-I70.0). Electronically Signed   By: Van Clines M.D.   On: 09/09/2018 14:14    EKG: Independently reviewed.   Assessment/Plan Principal Problem:   Compression fracture of thoracic vertebra (HCC)   Bone metastases (HCC) Observation/MedSurg. Supplemental oxygen as needed. Continue IV fluids. Start hydromorphone PCA. Continue fentanyl patches 50 mcg every 72 hours.  Active Problems:   Metastatic breast cancer ([place]) Treatment is going to be palliative at this point. Radiation may be an option for bone mets. Further recommendation per palliative team.    GERD (gastroesophageal reflux disease) Protonix 40 mg p.o. daily.    COPD with asthma ([place]) Supplemental oxygen and bronchodilators  as needed.    Hypokalemia Replacing. Follow-up potassium level.    Hypercalcemia Continue IV fluids. Follow-up calcium level.    DVT prophylaxis: Lovenox SQ Code Status: Full code. Family Communication: Disposition Plan: Observation for intractable pain management. Consults called:  Admission status: Observation/MedSurg.   Reubin Milan MD [facility]  09/09/2018, 9:36 PM   This document was prepared using Dragon voice recognition software and may contain some unintended transcription errors.

## 2018-09-09 NOTE — Telephone Encounter (Signed)
Called pt to follow up on her pain and symptoms. She is coming in tomorrow for first time chemo. Pt states that she is not doing well. She has uncontrolled pain, and sob getting worse. She does not know what to do and if she is able to tolerate getting chemo tomorrow. Discussed with Dr.Gudena to see if she can be seen today. Dr.Gudena would like to see pt today and discuss pt symptoms and plan of care. Pt agreeable and will add lab/MD appt for today. Pt verbally confirmed time/date today.

## 2018-09-09 NOTE — Assessment & Plan Note (Addendum)
Metastatic breast cancer with liver, bone, lung metastases Foundation one: ESR1 mutation (faslodex), FLT3 (ponatinib and sorafenib) mutation and PI3ca mutation (everolimus) BRCA: negative PDL1: Neg  Current treatment: Xeloda 1500 mg p.o. twice daily 2 weeks on 1 week off started 04/22/2018 stopped 07/29/2018 due to progression.  Prior treatment:  1.  Ibrance with Faslodex started 05/03/2016 to July 2019 2. Alpelisib started July 2019 stopped 04/06/2018 due to severe hyperglycemia uncontrolled. -------------------------------------------------------------- Severe/intractable back and pelvic pain: Patient has 100 mcg patch of fentanyl and takes Dilaudid orally 4 mg every 2-3 hours and in spite of that the pain has not been under good control.  Plan: 1.  Stat CT CAP: To assess if there are any areas in the bones that are amenable for palliative radiation.   2.  Pain management: We will administer IV Dilaudid in the clinic.  She will need to be admitted to the hospital for intractable pain issues. 3.  Goals of care: I discussed with her that further chemotherapy would be futile and that we have to focus on comfort care only.  She is agreeable with that. 4.  DNR CC 5.  We will need to consult hospice and palliative care to assist with her end-of-life needs. Her husband was also in the room when we discussed the goals. 6.  Recent chest x-ray showing pneumonia: On antibiotics currently.  They are both in agreement with the treatment plan.

## 2018-09-09 NOTE — Telephone Encounter (Signed)
Maelin states pain prior to administration of diluadid as "9" on scale of 1-10 .  2 mg hydromorphone given IV at 1210 pm.  At [place] stated her pain has decreased to " 6" , and stating " much better ".  Pt noted as drowsy per above inquiry.  Pt is sitting in wheelchair with husband at side.  Pt transported to CT with nurse and tech as well as husband per MD request for scan.

## 2018-09-10 ENCOUNTER — Other Ambulatory Visit: Payer: Self-pay

## 2018-09-10 ENCOUNTER — Ambulatory Visit: Payer: Self-pay

## 2018-09-10 ENCOUNTER — Ambulatory Visit: Payer: Self-pay | Admitting: Hematology and Oncology

## 2018-09-10 ENCOUNTER — Telehealth: Payer: Self-pay | Admitting: Hematology and Oncology

## 2018-09-10 ENCOUNTER — Ambulatory Visit
Admit: 2018-09-10 | Discharge: 2018-09-10 | Disposition: A | Discharge status: Home / Self Care | Payer: Medicare Other | Attending: Radiation Oncology | Admitting: Radiation Oncology

## 2018-09-10 DIAGNOSIS — Z803 Family history of malignant neoplasm of breast: Secondary | ICD-10-CM | POA: Diagnosis not present

## 2018-09-10 DIAGNOSIS — C7951 Secondary malignant neoplasm of bone: Secondary | ICD-10-CM

## 2018-09-10 DIAGNOSIS — C50919 Malignant neoplasm of unspecified site of unspecified female breast: Secondary | ICD-10-CM

## 2018-09-10 DIAGNOSIS — R112 Nausea with vomiting, unspecified: Secondary | ICD-10-CM | POA: Diagnosis not present

## 2018-09-10 DIAGNOSIS — M8458XA Pathological fracture in neoplastic disease, other specified site, initial encounter for fracture: Secondary | ICD-10-CM | POA: Diagnosis present

## 2018-09-10 DIAGNOSIS — I2510 Atherosclerotic heart disease of native coronary artery without angina pectoris: Secondary | ICD-10-CM | POA: Diagnosis present

## 2018-09-10 DIAGNOSIS — G893 Neoplasm related pain (acute) (chronic): Secondary | ICD-10-CM | POA: Diagnosis present

## 2018-09-10 DIAGNOSIS — Z515 Encounter for palliative care: Secondary | ICD-10-CM

## 2018-09-10 DIAGNOSIS — K5900 Constipation, unspecified: Secondary | ICD-10-CM | POA: Diagnosis present

## 2018-09-10 DIAGNOSIS — Z66 Do not resuscitate: Secondary | ICD-10-CM | POA: Diagnosis present

## 2018-09-10 DIAGNOSIS — Z8249 Family history of ischemic heart disease and other diseases of the circulatory system: Secondary | ICD-10-CM | POA: Diagnosis not present

## 2018-09-10 DIAGNOSIS — K210 Gastro-esophageal reflux disease with esophagitis: Secondary | ICD-10-CM

## 2018-09-10 DIAGNOSIS — G4700 Insomnia, unspecified: Secondary | ICD-10-CM | POA: Diagnosis present

## 2018-09-10 DIAGNOSIS — Z79891 Long term (current) use of opiate analgesic: Secondary | ICD-10-CM | POA: Diagnosis not present

## 2018-09-10 DIAGNOSIS — T451X5A Adverse effect of antineoplastic and immunosuppressive drugs, initial encounter: Secondary | ICD-10-CM | POA: Diagnosis not present

## 2018-09-10 DIAGNOSIS — M84550A Pathological fracture in neoplastic disease, pelvis, initial encounter for fracture: Secondary | ICD-10-CM | POA: Diagnosis present

## 2018-09-10 DIAGNOSIS — Z923 Personal history of irradiation: Secondary | ICD-10-CM | POA: Diagnosis not present

## 2018-09-10 DIAGNOSIS — C7800 Secondary malignant neoplasm of unspecified lung: Secondary | ICD-10-CM | POA: Diagnosis present

## 2018-09-10 DIAGNOSIS — S22000A Wedge compression fracture of unspecified thoracic vertebra, initial encounter for closed fracture: Secondary | ICD-10-CM | POA: Diagnosis not present

## 2018-09-10 DIAGNOSIS — J449 Chronic obstructive pulmonary disease, unspecified: Secondary | ICD-10-CM | POA: Diagnosis present

## 2018-09-10 DIAGNOSIS — M4850XA Collapsed vertebra, not elsewhere classified, site unspecified, initial encounter for fracture: Secondary | ICD-10-CM

## 2018-09-10 DIAGNOSIS — E876 Hypokalemia: Secondary | ICD-10-CM | POA: Diagnosis present

## 2018-09-10 DIAGNOSIS — I10 Essential (primary) hypertension: Secondary | ICD-10-CM | POA: Diagnosis present

## 2018-09-10 DIAGNOSIS — K219 Gastro-esophageal reflux disease without esophagitis: Secondary | ICD-10-CM | POA: Diagnosis present

## 2018-09-10 DIAGNOSIS — E785 Hyperlipidemia, unspecified: Secondary | ICD-10-CM | POA: Diagnosis present

## 2018-09-10 DIAGNOSIS — Z170 Estrogen receptor positive status [ER+]: Secondary | ICD-10-CM

## 2018-09-10 DIAGNOSIS — C787 Secondary malignant neoplasm of liver and intrahepatic bile duct: Secondary | ICD-10-CM

## 2018-09-10 DIAGNOSIS — C50511 Malignant neoplasm of lower-outer quadrant of right female breast: Secondary | ICD-10-CM

## 2018-09-10 DIAGNOSIS — Z853 Personal history of malignant neoplasm of breast: Secondary | ICD-10-CM | POA: Diagnosis not present

## 2018-09-10 DIAGNOSIS — Z955 Presence of coronary angioplasty implant and graft: Secondary | ICD-10-CM | POA: Diagnosis not present

## 2018-09-10 DIAGNOSIS — Z9221 Personal history of antineoplastic chemotherapy: Secondary | ICD-10-CM | POA: Diagnosis not present

## 2018-09-10 DIAGNOSIS — Z7189 Other specified counseling: Secondary | ICD-10-CM | POA: Diagnosis not present

## 2018-09-10 LAB — HIV ANTIBODY (ROUTINE TESTING W REFLEX): HIV Screen 4th Generation wRfx: NONREACTIVE

## 2018-09-10 MED ORDER — HYDROMORPHONE 1 MG/ML IV SOLN
INTRAVENOUS | Status: DC
  Administered 2018-09-10: 2.16 mg via INTRAVENOUS
  Administered 2018-09-11: 4 mg via INTRAVENOUS
  Administered 2018-09-11: 30 mg via INTRAVENOUS
  Administered 2018-09-11 (×2): 3 mg via INTRAVENOUS
  Administered 2018-09-11: 1 mg via INTRAVENOUS
  Administered 2018-09-11 – 2018-09-12 (×3): 3 mg via INTRAVENOUS
  Administered 2018-09-12: 0 mg via INTRAVENOUS
  Administered 2018-09-12: 5 mg via INTRAVENOUS
  Administered 2018-09-12: 1 mg via INTRAVENOUS
  Administered 2018-09-12: 2 mg via INTRAVENOUS
  Administered 2018-09-12: 1 mg via INTRAVENOUS
  Administered 2018-09-12: 5 mg via INTRAVENOUS
  Administered 2018-09-13: 30 mg via INTRAVENOUS
  Administered 2018-09-13: 1 mg via INTRAVENOUS
  Administered 2018-09-13: 3 mg via INTRAVENOUS
  Administered 2018-09-13: 5 mg via INTRAVENOUS
  Administered 2018-09-13: 4.7 mg via INTRAVENOUS
  Administered 2018-09-14 (×2): 1 mg via INTRAVENOUS
  Administered 2018-09-14: 3 mg via INTRAVENOUS
  Filled 2018-09-10 (×2): qty 30

## 2018-09-10 MED ORDER — FENTANYL 50 MCG/HR TD PT72
1.0000 | MEDICATED_PATCH | TRANSDERMAL | Status: DC
  Administered 2018-09-10: 1 via TRANSDERMAL
  Filled 2018-09-10 (×2): qty 1

## 2018-09-10 MED ORDER — SODIUM CHLORIDE 0.9 % IV SOLN
90.0000 mg | Freq: Once | INTRAVENOUS | Status: AC
  Administered 2018-09-10: 90 mg via INTRAVENOUS
  Filled 2018-09-10: qty 10

## 2018-09-10 MED ORDER — LORAZEPAM 1 MG PO TABS
1.0000 mg | ORAL_TABLET | Freq: Four times a day (QID) | ORAL | Status: DC | PRN
  Administered 2018-09-10 – 2018-09-12 (×4): 2 mg via ORAL
  Administered 2018-09-13: 1 mg via ORAL
  Administered 2018-09-13: 2 mg via ORAL
  Filled 2018-09-10 (×3): qty 2
  Filled 2018-09-10: qty 1
  Filled 2018-09-10: qty 2

## 2018-09-10 MED ORDER — NALOXONE HCL 0.4 MG/ML IJ SOLN: 0.4000 mg | INTRAMUSCULAR | Status: DC | PRN

## 2018-09-10 MED ORDER — IPRATROPIUM-ALBUTEROL 0.5-2.5 (3) MG/3ML IN SOLN
3.0000 mL | Freq: Three times a day (TID) | RESPIRATORY_TRACT | Status: DC
  Administered 2018-09-10 – 2018-09-12 (×7): 3 mL via RESPIRATORY_TRACT
  Filled 2018-09-10 (×8): qty 3

## 2018-09-10 MED ORDER — DEXAMETHASONE SODIUM PHOSPHATE 10 MG/ML IJ SOLN
8.0000 mg | Freq: Once | INTRAMUSCULAR | Status: AC
  Administered 2018-09-10: 8 mg via INTRAVENOUS
  Filled 2018-09-10: qty 1

## 2018-09-10 MED ORDER — HYDROMORPHONE 1 MG/ML IV SOLN: INTRAVENOUS | Status: DC

## 2018-09-10 MED ORDER — DEXAMETHASONE SODIUM PHOSPHATE 10 MG/ML IJ SOLN
4.0000 mg | Freq: Two times a day (BID) | INTRAMUSCULAR | Status: DC
  Administered 2018-09-11 – 2018-09-14 (×7): 4 mg via INTRAVENOUS
  Filled 2018-09-10 (×7): qty 1

## 2018-09-10 MED ORDER — PANTOPRAZOLE SODIUM 40 MG PO TBEC
40.0000 mg | DELAYED_RELEASE_TABLET | Freq: Every day | ORAL | Status: DC
  Administered 2018-09-11 – 2018-09-14 (×4): 40 mg via ORAL
  Filled 2018-09-10 (×5): qty 1

## 2018-09-10 MED ORDER — SODIUM CHLORIDE 0.9 % IV SOLN: 60.0000 mg | Freq: Once | INTRAVENOUS | Status: DC

## 2018-09-10 MED ORDER — SENNA 8.6 MG PO TABS
2.0000 | ORAL_TABLET | Freq: Every day | ORAL | Status: DC
  Administered 2018-09-10 – 2018-09-13 (×3): 17.2 mg via ORAL
  Filled 2018-09-10 (×3): qty 2

## 2018-09-10 MED ORDER — SODIUM CHLORIDE 0.9% FLUSH: 9.0000 mL | INTRAVENOUS | Status: DC | PRN

## 2018-09-10 MED ORDER — POTASSIUM CHLORIDE CRYS ER 20 MEQ PO TBCR
40.0000 meq | EXTENDED_RELEASE_TABLET | Freq: Once | ORAL | Status: AC
  Administered 2018-09-10: 40 meq via ORAL
  Filled 2018-09-10: qty 2

## 2018-09-10 NOTE — Consult Note (Addendum)
Radiation Oncology         [phone] ________________________________  Name: Suzanne [name]        MRN: [id number]  Date of Service: 09/10/2018         DOB: Jun 27, 1956  CC:Unk Pinto, MD    REFERRING PHYSICIAN: Dr. Lindi Adie  DIAGNOSIS: Metastatic Breast Cancer with Bone metastases  HISTORY OF PRESENT ILLNESS: Suzanne [name] is a 63 y.o. female seen at the request of Dr. Lindi Adie with a history of right breast cancer diagnosed originally in 1994 treated with mastectomy. She was found to have recurrent disease in 2007 in the chest wall. She had repeat excision in the right breast followed by interstitial radiotherapy and whole breast radiotherapy though details are not known. She had additional treatment with antiestrogen therapy but had recurrence in 2017 in the hilar and mediastinal nodes, as well as bone metastases in T1, T10, L5 and her left femoral shaft. She continued with Leslee Home, Faslodex, and Xgeva. This is stopped in July 20199, and she started on Alpelisib, but discontinued due to metabolic issues. She presented yesterday despite 100 mcg/hr Fentanyl and 4 mg Dilaudid prn. A CT C/A/P revealed progressive disease in the left femur, compression fracture as well as sclerotic changes in L4, and similar findings with compression fractures in T9, and T11. She was admitted for pain crisis. We're asked to see the patient to consider palliative radiotherapy.   PREVIOUS RADIATION THERAPY: Yes   10/23/2006-01/09/2007:  Interstital brachytherapy to the chest, and radiation to the right breast   PAST MEDICAL HISTORY:  Past Medical History:  Diagnosis Date  . Alcoholism ([place])   . Cancer [facility])    breast ca - 1994; recurred in 2007. s/p masectomy with flap rconstruction aand chemo '94. local recurrence on chest wall. chemo and XRT '07. now femara  . Depression   . Dyspnea    myoview 2011: EF 55% question of  mild reverible anterior defect. thought to be breast attenuation. echo 45-50% with  global HK. Grade 1 diastolyic dysfunction. RV nml. cardiac MRI with EF 44% with septal HK. no scar   . History of coronary artery stent placement   . Hyperlipidemia   . Hypertension   . Insomnia   . Lung disorder        PAST SURGICAL HISTORY: Past Surgical History:  Procedure Laterality Date  . CARDIAC CATHETERIZATION     [facility]  . MASTECTOMY    . ORIF DISTAL RADIUS FRACTURE    . PORT-A-CATH REMOVAL  07/29/2012   Procedure: REMOVAL PORT-A-CATH;  Surgeon: Haywood Lasso, MD;  Location: [place];  Service: General;  Laterality: Left;  . R masectomy  '94   . R transflap  '94     FAMILY HISTORY:  Family History  Problem Relation Age of Onset  . Hypertension Mother   . Heart attack Father   . Alcohol abuse Father   . Coronary artery disease Other        family hx  . Hyperlipidemia Other        family hx  . Thyroid disease Other        family hx   . Alcohol abuse Paternal Grandfather   . Alcohol abuse Paternal Uncle   . Cancer Paternal Grandmother        breast  . Alcohol abuse Brother      SOCIAL HISTORY:  reports that she has never smoked. She has never used smokeless tobacco. She reports current alcohol use.  She reports that she does not use drugs. The patient is a former Marine scientist. She lives in [place].    ALLERGIES: Ace inhibitors   MEDICATIONS:  Current Facility-Administered Medications  Medication Dose Route Frequency Provider Last Rate Last Dose  . 0.45 % NaCl with KCl 20 mEq / L infusion   Intravenous Continuous Reubin Milan, MD 125 mL/hr at 09/09/18 2230    . acetaminophen (TYLENOL) tablet 650 mg  650 mg Oral Q6H PRN Reubin Milan, MD       Or  . acetaminophen (TYLENOL) suppository 650 mg  650 mg Rectal Q6H PRN Reubin Milan, MD      . albuterol (PROVENTIL) (2.5 MG/3ML) 0.083% nebulizer solution 2.5 mg  2.5 mg Inhalation Q4H PRN Reubin Milan, MD   2.5 mg at 09/10/18 1157  . [START ON 09/11/2018]  dexamethasone (DECADRON) injection 4 mg  4 mg Intravenous T90Z Pershing Proud, NP      . dexamethasone (DECADRON) injection 8 mg  8 mg Intravenous Once Pershing Proud, NP      . enoxaparin (LOVENOX) injection 40 mg  40 mg Subcutaneous Q24H Reubin Milan, MD      . fentaNYL ([place]) 50 MCG/HR 1 patch  1 patch Transdermal E09Q Vinie Sill C, NP      . ipratropium-albuterol (DUONEB) 0.5-2.5 (3) MG/3ML nebulizer solution 3 mL  3 mL Nebulization TID Rai, Ripudeep K, MD      . loratadine (CLARITIN) tablet 10 mg  10 mg Oral Daily Reubin Milan, MD   10 mg at 09/10/18 0035  . LORazepam (ATIVAN) tablet 1-2 mg  1-2 mg Oral QHS Reubin Milan, MD   1 mg at 09/10/18 0035  . magnesium citrate solution 0.5 Bottle  0.5 Bottle Oral Once PRN Reubin Milan, MD      . milk and molasses enema  1 enema Rectal Once PRN Reubin Milan, MD      . ondansetron [facility]) injection 4 mg  4 mg Intravenous Q6H PRN Reubin Milan, MD      . pamidronate (AREDIA) 90 mg in sodium chloride 0.9 % 500 mL IVPB  90 mg Intravenous Once Rai, Ripudeep K, MD      . pantoprazole (PROTONIX) EC tablet 40 mg  40 mg Oral Daily Reubin Milan, MD      . potassium chloride SA (K-DUR,KLOR-CON) CR tablet 40 mEq  40 mEq Oral Once Rai, Ripudeep K, MD      . prochlorperazine (COMPAZINE) tablet 5 mg  5 mg Oral Q6H PRN Reubin Milan, MD       Or  . prochlorperazine (COMPAZINE) suppository 25 mg  25 mg Rectal Q12H PRN Reubin Milan, MD       Or  . prochlorperazine (COMPAZINE) injection 10 mg  10 mg Intravenous Q12H PRN Reubin Milan, MD   10 mg at 09/10/18 1336  . promethazine (PHENERGAN) tablet 25 mg  25 mg Oral Q6H PRN Reubin Milan, MD      . senna [facility]) tablet 17.2 mg  2 tablet Oral QHS Pershing Proud, NP       Facility-Administered Medications Ordered in Other Encounters  Medication Dose Route Frequency Provider Last Rate Last Dose  . HYDROmorphone (DILAUDID) injection  2 mg  2 mg Intravenous PRN Nicholas Lose, MD   2 mg at 09/09/18 1717  . HYDROmorphone (DILAUDID) injection 2 mg  2 mg Intravenous PRN Nicholas Lose, MD  REVIEW OF SYSTEMS: On review of systems, the patient reports that she is having terrible pain in the middle of her middle back radiating to the sides at the level of about T9-T10. She reports left hip pain as well as low pelvic pain left greater than right. She is short of breath, but with deep breathing is able to slow her respiratory rate back to normal. No other complaints are verbalized.    PHYSICAL EXAM:  Wt Readings from Last 3 Encounters:  09/09/18 162 lb 3.2 oz (73.6 kg)  09/09/18 162 lb 3.2 oz (73.6 kg)  09/05/18 163 lb 4.8 oz (74.1 kg)   Temp Readings from Last 3 Encounters:  09/10/18 98.9 F (37.2 C) (Oral)  09/09/18 98.2 F (36.8 C) (Oral)  09/09/18 98.4 F (36.9 C) (Oral)   BP Readings from Last 3 Encounters:  09/10/18 (!) 159/98  09/09/18 122/82  09/09/18 128/75   Pulse Readings from Last 3 Encounters:  09/10/18 95  09/09/18 95  09/09/18 100   Pain Assessment Pain Score: 8 /10  In general this is a chronically ill appearing caucasian female in no acute distress. She is alert and oriented x4 and appropriate throughout the examination. HEENT reveals that the patient is normocephalic, atraumatic. EOMs are intact. Skin is intact without any evidence of gross lesions. She has a respiratory rate at bedside between 25-40 breaths per minute. She is able to take deep breaths that seem to slow her rate. She does not appear to be in any acute distress otherwise.   ECOG = 3  0 - Asymptomatic (Fully active, able to carry on all predisease activities without restriction)  1 - Symptomatic but completely ambulatory (Restricted in physically strenuous activity but ambulatory and able to carry out work of a light or sedentary nature. For example, light housework, office work)  2 - Symptomatic, <50% in bed during the day  (Ambulatory and capable of all self care but unable to carry out any work activities. Up and about more than 50% of waking hours)  3 - Symptomatic, >50% in bed, but not bedbound (Capable of only limited self-care, confined to bed or chair 50% or more of waking hours)  4 - Bedbound (Completely disabled. Cannot carry on any self-care. Totally confined to bed or chair)  5 - Death   Eustace Pen MM, Creech RH, Tormey DC, et al. [phone]). "Toxicity and response criteria of the [facility]". [place] Oncol. 5 (6): 649-55    LABORATORY DATA:  Lab Results  Component Value Date   WBC 6.5 09/09/2018   HGB 12.2 09/09/2018   HCT 36.3 09/09/2018   MCV 112.0 (H) 09/09/2018   PLT 220 09/09/2018   Lab Results  Component Value Date   NA 139 09/09/2018   K 3.0 (LL) 09/09/2018   CL 88 (L) 09/09/2018   CO2 36 (H) 09/09/2018   Lab Results  Component Value Date   ALT 29 09/09/2018   AST 76 (H) 09/09/2018   ALKPHOS 171 (H) 09/09/2018   BILITOT 1.1 09/09/2018      RADIOGRAPHY: Dg Chest 2 View  Result Date: 09/05/2018 CLINICAL DATA:  History of breast cancer with wheezing and shortness of breath. EXAM: CHEST - 2 VIEW COMPARISON:  June 25, 2016 FINDINGS: The heart size and mediastinal contours are stable. Moderate to large right pleural effusion with consolidation of right mid to lung base are noted. The left lung is clear. The visualized skeletal structures are stable. IMPRESSION: Moderate to large  right pleural effusion with consolidation of right mid to lung base are noted. Underlying pneumonia is not excluded. Electronically Signed   By: Abelardo Diesel M.D.   On: 09/05/2018 13:34   Ct Chest W Contrast  Result Date: 09/09/2018 CLINICAL DATA:  Metastatic breast cancer currently on Xeloda palliative chemotherapy. Worsening dyspnea. Bone pain. EXAM: CT CHEST, ABDOMEN, AND PELVIS WITH CONTRAST TECHNIQUE: Multidetector CT imaging of the chest, abdomen and pelvis was performed  following the standard protocol during bolus administration of intravenous contrast. CONTRAST:  156mL OMNIPAQUE IOHEXOL 300 MG/ML  SOLN COMPARISON:  PET-CT 07/28/2018 FINDINGS: CT CHEST FINDINGS Cardiovascular: Unremarkable Mediastinum/Nodes: Left infrahilar node or nodule measuring 1.9 by 1.9 cm on image 25/2 is observed. There were previously smaller hypermetabolic lymph nodes in this vicinity on 07/28/2018. Lungs/Pleura: Moderate right pleural effusion without a well-defined pleural mass, but potentially with some mild loculation. Considerable atelectatic portions of the right middle lobe and right lower lobe which previously had hypermetabolic elements compatible with underlying metastatic lesions. A left lower lobe nodule was previously hypermetabolic and currently measures 1.2 cm in short axis thickness on image 83/6, formerly the same on prior PET-CT where the lesion was hypermetabolic. Trace left pleural effusion. Musculoskeletal: Widespread osseous metastatic disease is primarily sclerotic. Compared to 03/31/2018, there is a new 40% compression fracture at T9 and a new 15% superior endplate compression fracture at T11. Old healed sternal fracture. Primarily similar distribution of osseous metastatic disease compared to previous. Reconstruction related findings in the right breast, including a fluid collection along the right pectoralis muscle. CT ABDOMEN PELVIS FINDINGS Hepatobiliary: Scattered hepatic metastatic lesions are similar in distribution to the 07/28/2018 exam, but increased from the 03/31/2018 diagnostic CT examination. Cholelithiasis present potentially with mild gallbladder wall thickening. Extrahepatic biliary dilatation with CBD at 1.3 cm. This is significantly increased from 07/28/2018, raise the possibility of choledocholithiasis. No overt intrahepatic biliary dilatation. An index segment 4 enhancing metastatic lesion measures 1.5 by 1.5 cm on image 34/2. Pancreas: Unremarkable Spleen:  Unremarkable Adrenals/Urinary Tract: Unremarkable Stomach/Bowel: Unremarkable Vascular/Lymphatic: Mild aortoiliac atherosclerotic vascular calcification. Reproductive: Uterus absent.  Adnexa unremarkable. Other: No supplemental non-categorized findings. Musculoskeletal: Prior left tram flap procedure. Worsening metastatic disease in the left ischium with periostitis and suspicion for a small nondisplaced pathologic fracture of the left ischium on image 116/2. Worsening lytic lesions of the right superior pubic ramus and anterior wall of the right acetabulum. New 1.7 cm lytic lesion of the left femoral head. Widespread and increased sclerotic osseous metastatic disease in the bony pelvis. New periosteal reaction along the posterior left iliac bone likely related to a lytic mass on image 85/2. Metastatic burden in the lumbar spine has likewise mildly increased. New superior endplate compression fracture and inferior endplate compression fracture at the L4 level. IMPRESSION: 1. Worsening bony metastatic disease especially perceptible in the pelvis and lumbar spine. New compression fractures at T9, T11, and L4. 2. Probable pathologic fracture in the left ischium, nondisplaced. Worsening lytic metastatic lesions of the right superior pubic ramus and anterior wall of the right acetabulum as well as the left femoral head. 3. Enlarging left infrahilar mass/conglomerate adenopathy, 1.9 cm in diameter. Stable previously hypermetabolic left lower lobe pulmonary nodule. 4. Stable distribution of scattered hepatic metastatic lesions compared to the prior MRI. 5. Considerable atelectatic portions the right middle lobe and right lower lobe due to the right pleural effusion. These atelectatic regions were shown to harbor metastatic lesions on the prior PET-CT. 6. New extrahepatic biliary dilatation up to  1.3 cm. There is cholelithiasis and strictly speaking I can not exclude choledocholithiasis as a potential cause. Hepatobiliary  sonography may be warranted. 7. Other imaging findings of potential clinical significance: Moderate right and trace left pleural effusion. Aortic Atherosclerosis (ICD10-I70.0). Electronically Signed   By: Van Clines M.D.   On: 09/09/2018 14:14   Ct Abdomen Pelvis W Contrast  Result Date: 09/09/2018 CLINICAL DATA:  Metastatic breast cancer currently on Xeloda palliative chemotherapy. Worsening dyspnea. Bone pain. EXAM: CT CHEST, ABDOMEN, AND PELVIS WITH CONTRAST TECHNIQUE: Multidetector CT imaging of the chest, abdomen and pelvis was performed following the standard protocol during bolus administration of intravenous contrast. CONTRAST:  153mL OMNIPAQUE IOHEXOL 300 MG/ML  SOLN COMPARISON:  PET-CT 07/28/2018 FINDINGS: CT CHEST FINDINGS Cardiovascular: Unremarkable Mediastinum/Nodes: Left infrahilar node or nodule measuring 1.9 by 1.9 cm on image 25/2 is observed. There were previously smaller hypermetabolic lymph nodes in this vicinity on 07/28/2018. Lungs/Pleura: Moderate right pleural effusion without a well-defined pleural mass, but potentially with some mild loculation. Considerable atelectatic portions of the right middle lobe and right lower lobe which previously had hypermetabolic elements compatible with underlying metastatic lesions. A left lower lobe nodule was previously hypermetabolic and currently measures 1.2 cm in short axis thickness on image 83/6, formerly the same on prior PET-CT where the lesion was hypermetabolic. Trace left pleural effusion. Musculoskeletal: Widespread osseous metastatic disease is primarily sclerotic. Compared to 03/31/2018, there is a new 40% compression fracture at T9 and a new 15% superior endplate compression fracture at T11. Old healed sternal fracture. Primarily similar distribution of osseous metastatic disease compared to previous. Reconstruction related findings in the right breast, including a fluid collection along the right pectoralis muscle. CT ABDOMEN  PELVIS FINDINGS Hepatobiliary: Scattered hepatic metastatic lesions are similar in distribution to the 07/28/2018 exam, but increased from the 03/31/2018 diagnostic CT examination. Cholelithiasis present potentially with mild gallbladder wall thickening. Extrahepatic biliary dilatation with CBD at 1.3 cm. This is significantly increased from 07/28/2018, raise the possibility of choledocholithiasis. No overt intrahepatic biliary dilatation. An index segment 4 enhancing metastatic lesion measures 1.5 by 1.5 cm on image 34/2. Pancreas: Unremarkable Spleen: Unremarkable Adrenals/Urinary Tract: Unremarkable Stomach/Bowel: Unremarkable Vascular/Lymphatic: Mild aortoiliac atherosclerotic vascular calcification. Reproductive: Uterus absent.  Adnexa unremarkable. Other: No supplemental non-categorized findings. Musculoskeletal: Prior left tram flap procedure. Worsening metastatic disease in the left ischium with periostitis and suspicion for a small nondisplaced pathologic fracture of the left ischium on image 116/2. Worsening lytic lesions of the right superior pubic ramus and anterior wall of the right acetabulum. New 1.7 cm lytic lesion of the left femoral head. Widespread and increased sclerotic osseous metastatic disease in the bony pelvis. New periosteal reaction along the posterior left iliac bone likely related to a lytic mass on image 85/2. Metastatic burden in the lumbar spine has likewise mildly increased. New superior endplate compression fracture and inferior endplate compression fracture at the L4 level. IMPRESSION: 1. Worsening bony metastatic disease especially perceptible in the pelvis and lumbar spine. New compression fractures at T9, T11, and L4. 2. Probable pathologic fracture in the left ischium, nondisplaced. Worsening lytic metastatic lesions of the right superior pubic ramus and anterior wall of the right acetabulum as well as the left femoral head. 3. Enlarging left infrahilar mass/conglomerate  adenopathy, 1.9 cm in diameter. Stable previously hypermetabolic left lower lobe pulmonary nodule. 4. Stable distribution of scattered hepatic metastatic lesions compared to the prior MRI. 5. Considerable atelectatic portions the right middle lobe and right lower lobe due to  the right pleural effusion. These atelectatic regions were shown to harbor metastatic lesions on the prior PET-CT. 6. New extrahepatic biliary dilatation up to 1.3 cm. There is cholelithiasis and strictly speaking I can not exclude choledocholithiasis as a potential cause. Hepatobiliary sonography may be warranted. 7. Other imaging findings of potential clinical significance: Moderate right and trace left pleural effusion. Aortic Atherosclerosis (ICD10-I70.0). Electronically Signed   By: Van Clines M.D.   On: 09/09/2018 14:14       IMPRESSION/PLAN: 1. Metastatic Breast Cancer with bone metastases resulting in pain. Dr. Lisbeth Renshaw has reviewed the patients case and radiology reports and imaging from her recent CT scans. He would offer a course of palliative radiotherapy to the thoracic spine and low pelvis on the left into the left femur. I spent time explaining the rationale for radiotherapy, and I spoke with the patient's husband who joined [place] later into the discussion. We have the ability to consider simulation today or tomorrow with the intention of being able to start treatment as early as tomorrow. He would anticipate a course of 5 fractions so she can move forward with palliative/hospice based care.  We discussed the risks, benefits, short, and long term effects of radiotherapy, and the patient would like to consider her options this morning before making a decision.   In a visit lasting 70 minutes, greater than 50% of the time was spent face to face discussing her case, and coordinating the patient's care.     Carola Rhine, PAC   Addendum:  I called the patient's husband back at 11:00 am and they wanted to wait to  speak with Dr. Lindi Adie first before planning to proceed with any radiotherapy.     Carola Rhine, PAC

## 2018-09-10 NOTE — Telephone Encounter (Signed)
No los °

## 2018-09-10 NOTE — Progress Notes (Signed)
Patient continues to get OOB without assistance after being advised by staff to call for assistance. Patient has been advised numerous times. Bed alarm is on. Will continue to monitor.

## 2018-09-10 NOTE — Progress Notes (Signed)
[facility] Progress Note   Suzanne [name] [id number] [name] 63 y.o.  Suzanne [name] is managed by Dr. Lindi Adie  Actively treated with chemotherapy/immunotherapy/hormonal therapy: yes  Current Therapy: Xeloda  Assessment: Plan:    Neoplasm related pain - Plan: DG Chest 2 View  Malignant neoplasm metastatic to lung, unspecified laterality ([place])  Malignant neoplasm of lower-outer quadrant of right female breast, unspecified estrogen receptor status ([place])  Bone metastases (H[facility])  Shortness of breath - Plan: DG Chest 2 View   Shortness of breath: Patient was referred for a chest x-ray which returned showing: Moderate to large right pleural effusion with consolidation of right mid to lung base are noted. Underlying pneumonia is not excluded.  Metastatic breast cancer with bone metastasis: Patient was told that she could use Motrin.  She has increased her fentanyl patch to 75 mcg on her own and is been instructed that she can increase this to 100 mcg on 09/07/2018.  She was told that she could not have a refill of Dilaudid at this time given that she was given 120 Dilaudid on 01 13 and has taken most of these within a 10-day.  Please see After Visit Summary for patient specific instructions.  Future Appointments  Date Time Provider [place]  09/10/2018  5:00 PM Kyung Rudd, MD [facility] None  12/30/2018  3:45 PM Liane Comber, NP [facility] None    Orders Placed This Encounter  Procedures  . DG Chest 2 View       Subjective:   Patient ID:  Suzanne [name] is a 63 y.o. (DOB 1956-04-10) female.  Chief Complaint:  Chief Complaint  Patient presents with  . Back Pain    HPI Suzanne [name] is a 62 year old female with a history of metastatic breast cancer who is managed by Dr. Lindi Adie and is treated with palliative Xeloda.  She presents to the clinic today with increasing back and leg pain.  She also reports having shortness of breath.  She was given  Dilaudid 4 mg and was instructed to take 1 every 4 hours as needed for severe pain.  She was given 120 on 08/25/2018.  She reports that she is taking 6 a day and has almost completely taken this prescription.  It was reviewed with the patient that that would indicate that she has taken double the amount of Dilaudid that she was directed to take.  She increased her fentanyl patch to 75 mcg last evening on her own.  She denies fevers, chills, or sweats.  She continues on 3 L of oxygen via nasal cannula and has shortness of breath.  She has a history of COPD.  Medications: I have reviewed the patient's current medications.  Allergies:  Allergies  Allergen Reactions  . Ace Inhibitors     cough    Past Medical History:  Diagnosis Date  . Alcoholism ([place])   . Cancer [facility])    breast ca - 1994; recurred in 2007. s/p masectomy with flap rconstruction aand chemo '94. local recurrence on chest wall. chemo and XRT '07. now femara  . Depression   . Dyspnea    myoview 2011: EF 55% question of  mild reverible anterior defect. thought to be breast attenuation. echo 45-50% with global HK. Grade 1 diastolyic dysfunction. RV nml. cardiac MRI with EF 44% with septal HK. no scar   . History of coronary artery stent placement   . Hyperlipidemia   . Hypertension   . Insomnia   .  Lung disorder     Past Surgical History:  Procedure Laterality Date  . CARDIAC CATHETERIZATION     [facility]  . MASTECTOMY    . ORIF DISTAL RADIUS FRACTURE    . PORT-A-CATH REMOVAL  07/29/2012   Procedure: REMOVAL PORT-A-CATH;  Surgeon: Haywood Lasso, MD;  Location: [place];  Service: General;  Laterality: Left;  . R masectomy  '94   . R transflap  '94    Family History  Problem Relation Age of Onset  . Hypertension Mother   . Heart attack Father   . Alcohol abuse Father   . Coronary artery disease Other        family hx  . Hyperlipidemia Other        family hx  . Thyroid disease Other         family hx   . Alcohol abuse Paternal Grandfather   . Alcohol abuse Paternal Uncle   . Cancer Paternal Grandmother        breast  . Alcohol abuse Brother     Social History   Socioeconomic History  . Marital status: Married    Spouse name: Not on file  . Number of children: Not on file  . Years of education: Not on file  . Highest education level: Not on file  Occupational History  . Occupation: Disabilty  Social Needs  . Financial resource strain: Not on file  . Food insecurity:    Worry: Not on file    Inability: Not on file  . Transportation needs:    Medical: Not on file    Non-medical: Not on file  Tobacco Use  . Smoking status: Never Smoker  . Smokeless tobacco: Never Used  . Tobacco comment: just socially   Substance and Sexual Activity  . Alcohol use: Yes    Comment: No alcohol since 05/22/2012  . Drug use: No  . Sexual activity: Yes    Birth control/protection: Surgical  Lifestyle  . Physical activity:    Days per week: Not on file    Minutes per session: Not on file  . Stress: Not on file  Relationships  . Social connections:    Talks on phone: Not on file    Gets together: Not on file    Attends religious service: Not on file    Active member of club or organization: Not on file    Attends meetings of clubs or organizations: Not on file    Relationship status: Not on file  . Intimate partner violence:    Fear of current or ex partner: Not on file    Emotionally abused: Not on file    Physically abused: Not on file    Forced sexual activity: Not on file  Other Topics Concern  . Not on file  Social History Narrative   Chen was born in [place] and lived there until high school, when she moved to [facility]. She graduated from the [employer] with a bachelor of science in nursing. 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 She has been married for 27 years, and  has 2 children, a 89 year old son, and a 15 year old daughter. She denies any legal difficulties. She associates as a Engineer, manufacturing.     Past Medical History, Surgical history, Social history, and Family history were reviewed and updated as appropriate.   Please see review of systems for further details  on the patient's review from today.   Review of Systems:  Review of Systems  Constitutional: Negative for chills, diaphoresis and fever.  HENT: Negative for trouble swallowing and voice change.   Respiratory: Positive for shortness of breath. Negative for cough, choking, chest tightness, wheezing and stridor.   Cardiovascular: Negative for chest pain and palpitations.  Gastrointestinal: Negative for abdominal pain, constipation, diarrhea, nausea and vomiting.  Musculoskeletal: Positive for arthralgias and back pain. Negative for myalgias.  Neurological: Negative for dizziness, light-headedness and headaches.    Objective:   Physical Exam:  BP 104/71 (BP Location: Left Arm, Patient Position: Sitting)   Pulse (!) 113   Resp 20   Ht 5' 2.5" (1.588 m)   Wt 163 lb 4.8 oz (74.1 kg)   SpO2 96%   BMI 29.39 kg/m  ECOG: 2  Physical Exam Constitutional:      General: She is not in acute distress.    Appearance: She is not diaphoretic.     Comments: The patient is an adult female who appears to be lethargic but in no acute distress.  She is receiving oxygen via nasal cannula.  She stands during the entire exam.  HENT:     Head: Normocephalic and atraumatic.  Cardiovascular:     Rate and Rhythm: Regular rhythm. Tachycardia present.     Heart sounds: Normal heart sounds. No murmur. No friction rub.  Pulmonary:     Effort: Pulmonary effort is normal. No respiratory distress.     Breath sounds: No wheezing or rales.  Skin:    General: Skin is warm and dry.     Coloration: Skin is not pale.     Findings: No erythema.  Neurological:     Gait: Gait normal.     Lab Review:     Component  Value Date/Time   NA 139 09/09/2018 1059   NA 138 08/16/2017 1206   K 3.0 (LL) 09/09/2018 1059   K 3.6 08/16/2017 1206   CL 88 (L) 09/09/2018 1059   CL 101 12/07/2013 1656   CL 100 12/19/2012 1528   CO2 36 (H) 09/09/2018 1059   CO2 32 (H) 08/16/2017 1206   GLUCOSE 142 (H) 09/09/2018 1059   GLUCOSE 119 08/16/2017 1206   GLUCOSE 108 (H) 12/19/2012 1528   BUN 9 09/09/2018 1059   BUN 8.3 08/16/2017 1206   CREATININE 0.76 09/09/2018 1059   CREATININE 0.66 03/26/2018 1647   CREATININE 0.8 08/16/2017 1206   CALCIUM 11.3 (H) 09/09/2018 1059   CALCIUM 8.8 08/16/2017 1206   PROT 7.5 09/09/2018 1059   PROT 7.5 08/16/2017 1206   ALBUMIN 3.0 (L) 09/09/2018 1059   ALBUMIN 3.0 (L) 08/16/2017 1206   AST 76 (H) 09/09/2018 1059   AST 109 (H) 08/16/2017 1206   ALT 29 09/09/2018 1059   ALT 38 08/16/2017 1206   ALKPHOS 171 (H) 09/09/2018 1059   ALKPHOS 114 08/16/2017 1206   BILITOT 1.1 09/09/2018 1059   BILITOT 1.56 (H) 08/16/2017 1206   GFRNONAA >60 09/09/2018 1059   GFRNONAA 96 03/26/2018 1647   GFRAA >60 09/09/2018 1059   GFRAA 111 03/26/2018 1647       Component Value Date/Time   WBC 6.5 09/09/2018 1059   WBC 7.3 03/26/2018 1647   RBC 3.24 (L) 09/09/2018 1059   HGB 12.2 09/09/2018 1059   HGB 13.3 08/16/2017 1206   HCT 36.3 09/09/2018 1059   HCT 38.5 08/16/2017 1206   PLT 220 09/09/2018 1059   PLT 125 (L)  08/16/2017 1206   MCV 112.0 (H) 09/09/2018 1059   MCV 125.0 (H) 08/16/2017 1206   MCH 37.7 (H) 09/09/2018 1059   MCHC 33.6 09/09/2018 1059   RDW 14.8 09/09/2018 1059   RDW 13.7 08/16/2017 1206   LYMPHSABS 1.0 09/09/2018 1059   LYMPHSABS 0.9 08/16/2017 1206   MONOABS 1.0 09/09/2018 1059   MONOABS 0.3 08/16/2017 1206   EOSABS 0.0 09/09/2018 1059   EOSABS 0.1 08/16/2017 1206   BASOSABS 0.0 09/09/2018 1059   BASOSABS 0.1 08/16/2017 1206   -------------------------------  Imaging from last 24 hours (if applicable):  Radiology interpretation: Dg Chest 2 View  Result  Date: 09/05/2018 CLINICAL DATA:  History of breast cancer with wheezing and shortness of breath. EXAM: CHEST - 2 VIEW COMPARISON:  June 25, 2016 FINDINGS: The heart size and mediastinal contours are stable. Moderate to large right pleural effusion with consolidation of right mid to lung base are noted. The left lung is clear. The visualized skeletal structures are stable. IMPRESSION: Moderate to large right pleural effusion with consolidation of right mid to lung base are noted. Underlying pneumonia is not excluded. Electronically Signed   By: Abelardo Diesel M.D.   On: 09/05/2018 13:34   Ct Chest W Contrast  Result Date: 09/09/2018 CLINICAL DATA:  Metastatic breast cancer currently on Xeloda palliative chemotherapy. Worsening dyspnea. Bone pain. EXAM: CT CHEST, ABDOMEN, AND PELVIS WITH CONTRAST TECHNIQUE: Multidetector CT imaging of the chest, abdomen and pelvis was performed following the standard protocol during bolus administration of intravenous contrast. CONTRAST:  186mL OMNIPAQUE IOHEXOL 300 MG/ML  SOLN COMPARISON:  PET-CT 07/28/2018 FINDINGS: CT CHEST FINDINGS Cardiovascular: Unremarkable Mediastinum/Nodes: Left infrahilar node or nodule measuring 1.9 by 1.9 cm on image 25/2 is observed. There were previously smaller hypermetabolic lymph nodes in this vicinity on 07/28/2018. Lungs/Pleura: Moderate right pleural effusion without a well-defined pleural mass, but potentially with some mild loculation. Considerable atelectatic portions of the right middle lobe and right lower lobe which previously had hypermetabolic elements compatible with underlying metastatic lesions. A left lower lobe nodule was previously hypermetabolic and currently measures 1.2 cm in short axis thickness on image 83/6, formerly the same on prior PET-CT where the lesion was hypermetabolic. Trace left pleural effusion. Musculoskeletal: Widespread osseous metastatic disease is primarily sclerotic. Compared to 03/31/2018, there is a new  40% compression fracture at T9 and a new 15% superior endplate compression fracture at T11. Old healed sternal fracture. Primarily similar distribution of osseous metastatic disease compared to previous. Reconstruction related findings in the right breast, including a fluid collection along the right pectoralis muscle. CT ABDOMEN PELVIS FINDINGS Hepatobiliary: Scattered hepatic metastatic lesions are similar in distribution to the 07/28/2018 exam, but increased from the 03/31/2018 diagnostic CT examination. Cholelithiasis present potentially with mild gallbladder wall thickening. Extrahepatic biliary dilatation with CBD at 1.3 cm. This is significantly increased from 07/28/2018, raise the possibility of choledocholithiasis. No overt intrahepatic biliary dilatation. An index segment 4 enhancing metastatic lesion measures 1.5 by 1.5 cm on image 34/2. Pancreas: Unremarkable Spleen: Unremarkable Adrenals/Urinary Tract: Unremarkable Stomach/Bowel: Unremarkable Vascular/Lymphatic: Mild aortoiliac atherosclerotic vascular calcification. Reproductive: Uterus absent.  Adnexa unremarkable. Other: No supplemental non-categorized findings. Musculoskeletal: Prior left tram flap procedure. Worsening metastatic disease in the left ischium with periostitis and suspicion for a small nondisplaced pathologic fracture of the left ischium on image 116/2. Worsening lytic lesions of the right superior pubic ramus and anterior wall of the right acetabulum. New 1.7 cm lytic lesion of the left femoral head. Widespread and increased  sclerotic osseous metastatic disease in the bony pelvis. New periosteal reaction along the posterior left iliac bone likely related to a lytic mass on image 85/2. Metastatic burden in the lumbar spine has likewise mildly increased. New superior endplate compression fracture and inferior endplate compression fracture at the L4 level. IMPRESSION: 1. Worsening bony metastatic disease especially perceptible in the  pelvis and lumbar spine. New compression fractures at T9, T11, and L4. 2. Probable pathologic fracture in the left ischium, nondisplaced. Worsening lytic metastatic lesions of the right superior pubic ramus and anterior wall of the right acetabulum as well as the left femoral head. 3. Enlarging left infrahilar mass/conglomerate adenopathy, 1.9 cm in diameter. Stable previously hypermetabolic left lower lobe pulmonary nodule. 4. Stable distribution of scattered hepatic metastatic lesions compared to the prior MRI. 5. Considerable atelectatic portions the right middle lobe and right lower lobe due to the right pleural effusion. These atelectatic regions were shown to harbor metastatic lesions on the prior PET-CT. 6. New extrahepatic biliary dilatation up to 1.3 cm. There is cholelithiasis and strictly speaking I can not exclude choledocholithiasis as a potential cause. Hepatobiliary sonography may be warranted. 7. Other imaging findings of potential clinical significance: Moderate right and trace left pleural effusion. Aortic Atherosclerosis (ICD10-I70.0). Electronically Signed   By: Griselda Bramblett Clines M.D.   On: 09/09/2018 14:14   Ct Abdomen Pelvis W Contrast  Result Date: 09/09/2018 CLINICAL DATA:  Metastatic breast cancer currently on Xeloda palliative chemotherapy. Worsening dyspnea. Bone pain. EXAM: CT CHEST, ABDOMEN, AND PELVIS WITH CONTRAST TECHNIQUE: Multidetector CT imaging of the chest, abdomen and pelvis was performed following the standard protocol during bolus administration of intravenous contrast. CONTRAST:  145mL OMNIPAQUE IOHEXOL 300 MG/ML  SOLN COMPARISON:  PET-CT 07/28/2018 FINDINGS: CT CHEST FINDINGS Cardiovascular: Unremarkable Mediastinum/Nodes: Left infrahilar node or nodule measuring 1.9 by 1.9 cm on image 25/2 is observed. There were previously smaller hypermetabolic lymph nodes in this vicinity on 07/28/2018. Lungs/Pleura: Moderate right pleural effusion without a well-defined pleural  mass, but potentially with some mild loculation. Considerable atelectatic portions of the right middle lobe and right lower lobe which previously had hypermetabolic elements compatible with underlying metastatic lesions. A left lower lobe nodule was previously hypermetabolic and currently measures 1.2 cm in short axis thickness on image 83/6, formerly the same on prior PET-CT where the lesion was hypermetabolic. Trace left pleural effusion. Musculoskeletal: Widespread osseous metastatic disease is primarily sclerotic. Compared to 03/31/2018, there is a new 40% compression fracture at T9 and a new 15% superior endplate compression fracture at T11. Old healed sternal fracture. Primarily similar distribution of osseous metastatic disease compared to previous. Reconstruction related findings in the right breast, including a fluid collection along the right pectoralis muscle. CT ABDOMEN PELVIS FINDINGS Hepatobiliary: Scattered hepatic metastatic lesions are similar in distribution to the 07/28/2018 exam, but increased from the 03/31/2018 diagnostic CT examination. Cholelithiasis present potentially with mild gallbladder wall thickening. Extrahepatic biliary dilatation with CBD at 1.3 cm. This is significantly increased from 07/28/2018, raise the possibility of choledocholithiasis. No overt intrahepatic biliary dilatation. An index segment 4 enhancing metastatic lesion measures 1.5 by 1.5 cm on image 34/2. Pancreas: Unremarkable Spleen: Unremarkable Adrenals/Urinary Tract: Unremarkable Stomach/Bowel: Unremarkable Vascular/Lymphatic: Mild aortoiliac atherosclerotic vascular calcification. Reproductive: Uterus absent.  Adnexa unremarkable. Other: No supplemental non-categorized findings. Musculoskeletal: Prior left tram flap procedure. Worsening metastatic disease in the left ischium with periostitis and suspicion for a small nondisplaced pathologic fracture of the left ischium on image 116/2. Worsening lytic lesions of the  right superior pubic ramus and anterior wall of the right acetabulum. New 1.7 cm lytic lesion of the left femoral head. Widespread and increased sclerotic osseous metastatic disease in the bony pelvis. New periosteal reaction along the posterior left iliac bone likely related to a lytic mass on image 85/2. Metastatic burden in the lumbar spine has likewise mildly increased. New superior endplate compression fracture and inferior endplate compression fracture at the L4 level. IMPRESSION: 1. Worsening bony metastatic disease especially perceptible in the pelvis and lumbar spine. New compression fractures at T9, T11, and L4. 2. Probable pathologic fracture in the left ischium, nondisplaced. Worsening lytic metastatic lesions of the right superior pubic ramus and anterior wall of the right acetabulum as well as the left femoral head. 3. Enlarging left infrahilar mass/conglomerate adenopathy, 1.9 cm in diameter. Stable previously hypermetabolic left lower lobe pulmonary nodule. 4. Stable distribution of scattered hepatic metastatic lesions compared to the prior MRI. 5. Considerable atelectatic portions the right middle lobe and right lower lobe due to the right pleural effusion. These atelectatic regions were shown to harbor metastatic lesions on the prior PET-CT. 6. New extrahepatic biliary dilatation up to 1.3 cm. There is cholelithiasis and strictly speaking I can not exclude choledocholithiasis as a potential cause. Hepatobiliary sonography may be warranted. 7. Other imaging findings of potential clinical significance: Moderate right and trace left pleural effusion. Aortic Atherosclerosis (ICD10-I70.0). Electronically Signed   By: Almon Whitford Clines M.D.   On: 09/09/2018 14:14

## 2018-09-10 NOTE — Progress Notes (Signed)
HEMATOLOGY-ONCOLOGY PROGRESS NOTE  SUBJECTIVE: Intractable pain continues. She could not sleep too well.  She has been using the Dilaudid PCA pump.  Palliative care has been adjusting the dosage of the pump.  She tells me that she actually had a brief sleep today.  It takes the edge away.  She is using oxygen by nasal cannula and appears to be drowsy but answering questions appropriately.  OBJECTIVE: REVIEW OF SYSTEMS:   Constitutional: Diffuse pain throughout her back and pelvis Eyes: Denies blurriness of vision Ears, nose, mouth, throat, and face: Denies mucositis or sore throat Respiratory: Denies cough, dyspnea or wheezes Cardiovascular: Denies palpitation, chest discomfort Gastrointestinal:  Denies nausea, heartburn or change in bowel habits Skin: Denies abnormal skin rashes Lymphatics: Denies new lymphadenopathy or easy bruising Neurological: Severe generalized weaknesses Behavioral/Psych: Depressed Extremities: No lower extremity edema   PHYSICAL EXAMINATION: ECOG PERFORMANCE STATUS: 4 - Bedbound  Vitals:   09/10/18 1351 09/10/18 1507  BP: (!) 159/98   Pulse: 95   Resp: 18 (!) 27  Temp: 98.9 F (37.2 C)   SpO2: 97% 92%   Filed Weights   09/09/18 1855  Weight: 162 lb 3.2 oz (73.6 kg)    GENERAL: Drowsy and in distress related to pain SKIN: skin color, texture, turgor are normal, no rashes or significant lesions EYES: normal, Conjunctiva are pink and non-injected, sclera clear OROPHARYNX:no exudate, no erythema and lips, buccal mucosa, and tongue normal  NECK: supple, thyroid normal size, non-tender, without nodularity  LUNGS: Mild wheezing  HEART: regular rate & rhythm and no murmurs and no lower extremity edema ABDOMEN:abdomen soft, non-tender and normal bowel sounds Musculoskeletal: Diffuse tenderness throughout her body NEURO: Drowsy but moving all extremities  LABORATORY DATA:  I have reviewed the data as listed CMP Latest Ref Rng & Units 09/09/2018  09/05/2018 07/29/2018  Glucose 70 - 99 mg/dL 142(H) 117(H) 154(H)  BUN 8 - 23 mg/dL 9 8 12   Creatinine 0.44 - 1.00 mg/dL 0.76 0.73 0.76  Sodium 135 - 145 mmol/L 139 139 139  Potassium 3.5 - 5.1 mmol/L 3.0(LL) 3.7 3.6  Chloride 98 - 111 mmol/L 88(L) 92(L) 94(L)  CO2 22 - 32 mmol/L 36(H) 34(H) 34(H)  Calcium 8.9 - 10.3 mg/dL 11.3(H) 11.6(H) 9.6  Total Protein 6.5 - 8.1 g/dL 7.5 7.8 7.9  Total Bilirubin 0.3 - 1.2 mg/dL 1.1 1.2 1.4(H)  Alkaline Phos 38 - 126 U/L 171(H) 165(H) 123  AST 15 - 41 U/L 76(H) 75(H) 128(H)  ALT 0 - 44 U/L 29 30 50(H)    Lab Results  Component Value Date   WBC 6.5 09/09/2018   HGB 12.2 09/09/2018   HCT 36.3 09/09/2018   MCV 112.0 (H) 09/09/2018   PLT 220 09/09/2018   NEUTROABS 4.5 09/09/2018    ASSESSMENT AND PLAN: 1.  Severe/intractable bone pain due to extensive bone metastases: Currently on Dilaudid PCA pump for pain control. Yesterday I reviewed the CT scans with the patient and it showed worsening metastatic disease as well as extensive compression fractures. Dr. Valere Dross with radiation oncology saw the patient and offered palliative radiation but patient refused it.  The goal is to try to get her on a pain regimen that she can continue at home with hospice.  Her preference is to go home.  However if she were to require a PCA pump but might make it difficult.  I am also concerned about the extent of social support for the patient.  Her husband stays very busy with work.  2. Metastatic breast cancer with liver and bone metastases: Hospice care

## 2018-09-10 NOTE — Progress Notes (Signed)
[facility]                                                                              Patient Demographics  Suzanne [name], is a 63 y.o. female, DOB - 02/26/1956, SNK:[id number]  Admit date - 09/09/2018   Admitting Physician Sid Falcon, MD  Outpatient Primary MD for the patient is Unk Pinto, MD  Outpatient specialists:   LOS - 1  days   Medical records reviewed and are as summarized below:    No chief complaint on file.      Brief summary   Suzanne [name] is a 63 y.o. female with medical history significant of EtOH abuse, metastatic breast cancer, depression, COPD, CAD, history of stent placement, hyperlipidemia, hypertension, insomnia  who was sent from cancer center due to progressively worse back pain since last week after the patient had a fall while trying going to the bathroom in her house at night with very daily illumination.  She denied head trauma or LOC.    CT abdomen pelvis showed worsening bony metastatic disease specially in the pelvis and lumbar spine, new compression fractures T9, T11, L4.  Probable pathological fracture in the left ischium, nondisplaced, worsening lytic metastatic lesions in the right superior pubic rami and anterior wall of right establishment as well as the left femoral head.  Assessment & Plan    Principal Problem: Intractable pain secondary to worsening bony metastatic disease in the thoracic, lumbar spine, pelvis  - CT abdomen pelvis showed worsening bony metastatic disease specially in the pelvis and lumbar spine, new compression fractures T9, T11, L4.  Probable pathological fracture in the left ischium, nondisplaced, worsening lytic metastatic lesions in the right superior pubic rami and anterior wall of right establishment as well as the left femoral head. -Patient was placed on Dilaudid PCA,, fentanyl patch, palliative consulted for pain control and hospice -Patient's husband has questions about palliative  radiation to the bony metastasis, will defer to oncology -Continue focus on pain control and comfort   Active Problems: Metastatic right breast cancer -Per oncology, further chemotherapy would be futile and now focus on comfort care and pain control    GERD (gastroesophageal reflux disease) -Continue PPI    COPD with asthma ([facility]) -Wheezing bilaterally, placed on scheduled duo nebs 3 times daily and as needed, continue supplemental O2   Hypokalemia -Replaced  Hypercalcemia -Corrected calcium with albumin is 12.1 -Continue IV fluids, will give 1 dose of Aredia  Code Status: DNR status DVT Prophylaxis:  Lovenox Family Communication: Discussed in detail with the patient, all imaging results, lab results explained to the patient and husband at the bedside   Disposition Plan: Once pain is better controlled  Time Spent in minutes 25 minutes  Procedures:  CT abdomen and pelvis  Consultants:   Oncology  Antimicrobials:   Anti-infectives (From admission, onward)   None          Medications  Scheduled Meds: . enoxaparin (LOVENOX) injection  40 mg Subcutaneous Q24H  . [START ON 09/12/2018] fentaNYL  1 patch Transdermal Q72H  . HYDROmorphone   Intravenous Q4H  . loratadine  10 mg  Oral Daily  . LORazepam  1-2 mg Oral QHS  . pantoprazole  40 mg Oral Daily   Continuous Infusions: . 0.45 % NaCl with KCl 20 mEq / L 125 mL/hr at 09/09/18 2230   PRN Meds:.acetaminophen **OR** acetaminophen, albuterol, diphenhydrAMINE **OR** diphenhydrAMINE, magnesium citrate, milk and molasses, naloxone **AND** sodium chloride flush, ondansetron (ZOFRAN) IV, prochlorperazine **OR** prochlorperazine **OR** prochlorperazine, promethazine      Subjective:   Raja Caputi was seen and examined today.  Had a rough night with pain, currently on Dilaudid PCA, 7/10 pain, alert and oriented, feels better from last night.  Husband at bedside. Patient denies dizziness, chest pain, shortness of  breath, abdominal pain.    Objective:   Vitals:   09/10/18 0525 09/10/18 0800 09/10/18 0831 09/10/18 1157  BP: 127/88     Pulse: 87     Resp: 17 (!) 22 (!) 22   Temp: 98.3 F (36.8 C)     TempSrc: Oral     SpO2: 95% 92% 92% 93%  Weight:      Height:        Intake/Output Summary (Last 24 hours) at 09/10/2018 1332 Last data filed at 09/10/2018 1000 Gross per 24 hour  Intake 670 ml  Output -  Net 670 ml     Wt Readings from Last 3 Encounters:  09/09/18 73.6 kg  09/09/18 73.6 kg  09/05/18 74.1 kg     Exam  General: Alert and oriented x 3, uncomfortable, ill-appearing  Eyes:   HEENT:  Atraumatic, normocephalic, normal oropharynx  Cardiovascular: S1 S2 auscultated,  Regular rate and rhythm.  Respiratory: Expiratory wheezing bilaterally  Gastrointestinal: Soft, nontender, nondistended, + bowel sounds  Ext: no pedal edema bilaterally  Neuro: No new deficits  Musculoskeletal: No digital cyanosis, clubbing  Skin: No rashes  Psych: Normal affect and demeanor, alert and oriented x3    Data Reviewed:  I have personally reviewed following labs and imaging studies  Micro Results No results found for this or any previous visit (from the past 240 hour(s)).  Radiology Reports Dg Chest 2 View  Result Date: 09/05/2018 CLINICAL DATA:  History of breast cancer with wheezing and shortness of breath. EXAM: CHEST - 2 VIEW COMPARISON:  June 25, 2016 FINDINGS: The heart size and mediastinal contours are stable. Moderate to large right pleural effusion with consolidation of right mid to lung base are noted. The left lung is clear. The visualized skeletal structures are stable. IMPRESSION: Moderate to large right pleural effusion with consolidation of right mid to lung base are noted. Underlying pneumonia is not excluded. Electronically Signed   By: Abelardo Diesel M.D.   On: 09/05/2018 13:34   Ct Chest W Contrast  Result Date: 09/09/2018 CLINICAL DATA:  Metastatic breast  cancer currently on Xeloda palliative chemotherapy. Worsening dyspnea. Bone pain. EXAM: CT CHEST, ABDOMEN, AND PELVIS WITH CONTRAST TECHNIQUE: Multidetector CT imaging of the chest, abdomen and pelvis was performed following the standard protocol during bolus administration of intravenous contrast. CONTRAST:  133mL OMNIPAQUE IOHEXOL 300 MG/ML  SOLN COMPARISON:  PET-CT 07/28/2018 FINDINGS: CT CHEST FINDINGS Cardiovascular: Unremarkable Mediastinum/Nodes: Left infrahilar node or nodule measuring 1.9 by 1.9 cm on image 25/2 is observed. There were previously smaller hypermetabolic lymph nodes in this vicinity on 07/28/2018. Lungs/Pleura: Moderate right pleural effusion without a well-defined pleural mass, but potentially with some mild loculation. Considerable atelectatic portions of the right middle lobe and right lower lobe which previously had hypermetabolic elements compatible with underlying metastatic lesions. A left  lower lobe nodule was previously hypermetabolic and currently measures 1.2 cm in short axis thickness on image 83/6, formerly the same on prior PET-CT where the lesion was hypermetabolic. Trace left pleural effusion. Musculoskeletal: Widespread osseous metastatic disease is primarily sclerotic. Compared to 03/31/2018, there is a new 40% compression fracture at T9 and a new 15% superior endplate compression fracture at T11. Old healed sternal fracture. Primarily similar distribution of osseous metastatic disease compared to previous. Reconstruction related findings in the right breast, including a fluid collection along the right pectoralis muscle. CT ABDOMEN PELVIS FINDINGS Hepatobiliary: Scattered hepatic metastatic lesions are similar in distribution to the 07/28/2018 exam, but increased from the 03/31/2018 diagnostic CT examination. Cholelithiasis present potentially with mild gallbladder wall thickening. Extrahepatic biliary dilatation with CBD at 1.3 cm. This is significantly increased from  07/28/2018, raise the possibility of choledocholithiasis. No overt intrahepatic biliary dilatation. An index segment 4 enhancing metastatic lesion measures 1.5 by 1.5 cm on image 34/2. Pancreas: Unremarkable Spleen: Unremarkable Adrenals/Urinary Tract: Unremarkable Stomach/Bowel: Unremarkable Vascular/Lymphatic: Mild aortoiliac atherosclerotic vascular calcification. Reproductive: Uterus absent.  Adnexa unremarkable. Other: No supplemental non-categorized findings. Musculoskeletal: Prior left tram flap procedure. Worsening metastatic disease in the left ischium with periostitis and suspicion for a small nondisplaced pathologic fracture of the left ischium on image 116/2. Worsening lytic lesions of the right superior pubic ramus and anterior wall of the right acetabulum. New 1.7 cm lytic lesion of the left femoral head. Widespread and increased sclerotic osseous metastatic disease in the bony pelvis. New periosteal reaction along the posterior left iliac bone likely related to a lytic mass on image 85/2. Metastatic burden in the lumbar spine has likewise mildly increased. New superior endplate compression fracture and inferior endplate compression fracture at the L4 level. IMPRESSION: 1. Worsening bony metastatic disease especially perceptible in the pelvis and lumbar spine. New compression fractures at T9, T11, and L4. 2. Probable pathologic fracture in the left ischium, nondisplaced. Worsening lytic metastatic lesions of the right superior pubic ramus and anterior wall of the right acetabulum as well as the left femoral head. 3. Enlarging left infrahilar mass/conglomerate adenopathy, 1.9 cm in diameter. Stable previously hypermetabolic left lower lobe pulmonary nodule. 4. Stable distribution of scattered hepatic metastatic lesions compared to the prior MRI. 5. Considerable atelectatic portions the right middle lobe and right lower lobe due to the right pleural effusion. These atelectatic regions were shown to  harbor metastatic lesions on the prior PET-CT. 6. New extrahepatic biliary dilatation up to 1.3 cm. There is cholelithiasis and strictly speaking I can not exclude choledocholithiasis as a potential cause. Hepatobiliary sonography may be warranted. 7. Other imaging findings of potential clinical significance: Moderate right and trace left pleural effusion. Aortic Atherosclerosis (ICD10-I70.0). Electronically Signed   By: Van Clines M.D.   On: 09/09/2018 14:14   Ct Abdomen Pelvis W Contrast  Result Date: 09/09/2018 CLINICAL DATA:  Metastatic breast cancer currently on Xeloda palliative chemotherapy. Worsening dyspnea. Bone pain. EXAM: CT CHEST, ABDOMEN, AND PELVIS WITH CONTRAST TECHNIQUE: Multidetector CT imaging of the chest, abdomen and pelvis was performed following the standard protocol during bolus administration of intravenous contrast. CONTRAST:  160mL OMNIPAQUE IOHEXOL 300 MG/ML  SOLN COMPARISON:  PET-CT 07/28/2018 FINDINGS: CT CHEST FINDINGS Cardiovascular: Unremarkable Mediastinum/Nodes: Left infrahilar node or nodule measuring 1.9 by 1.9 cm on image 25/2 is observed. There were previously smaller hypermetabolic lymph nodes in this vicinity on 07/28/2018. Lungs/Pleura: Moderate right pleural effusion without a well-defined pleural mass, but potentially with some mild loculation. Considerable  atelectatic portions of the right middle lobe and right lower lobe which previously had hypermetabolic elements compatible with underlying metastatic lesions. A left lower lobe nodule was previously hypermetabolic and currently measures 1.2 cm in short axis thickness on image 83/6, formerly the same on prior PET-CT where the lesion was hypermetabolic. Trace left pleural effusion. Musculoskeletal: Widespread osseous metastatic disease is primarily sclerotic. Compared to 03/31/2018, there is a new 40% compression fracture at T9 and a new 15% superior endplate compression fracture at T11. Old healed sternal  fracture. Primarily similar distribution of osseous metastatic disease compared to previous. Reconstruction related findings in the right breast, including a fluid collection along the right pectoralis muscle. CT ABDOMEN PELVIS FINDINGS Hepatobiliary: Scattered hepatic metastatic lesions are similar in distribution to the 07/28/2018 exam, but increased from the 03/31/2018 diagnostic CT examination. Cholelithiasis present potentially with mild gallbladder wall thickening. Extrahepatic biliary dilatation with CBD at 1.3 cm. This is significantly increased from 07/28/2018, raise the possibility of choledocholithiasis. No overt intrahepatic biliary dilatation. An index segment 4 enhancing metastatic lesion measures 1.5 by 1.5 cm on image 34/2. Pancreas: Unremarkable Spleen: Unremarkable Adrenals/Urinary Tract: Unremarkable Stomach/Bowel: Unremarkable Vascular/Lymphatic: Mild aortoiliac atherosclerotic vascular calcification. Reproductive: Uterus absent.  Adnexa unremarkable. Other: No supplemental non-categorized findings. Musculoskeletal: Prior left tram flap procedure. Worsening metastatic disease in the left ischium with periostitis and suspicion for a small nondisplaced pathologic fracture of the left ischium on image 116/2. Worsening lytic lesions of the right superior pubic ramus and anterior wall of the right acetabulum. New 1.7 cm lytic lesion of the left femoral head. Widespread and increased sclerotic osseous metastatic disease in the bony pelvis. New periosteal reaction along the posterior left iliac bone likely related to a lytic mass on image 85/2. Metastatic burden in the lumbar spine has likewise mildly increased. New superior endplate compression fracture and inferior endplate compression fracture at the L4 level. IMPRESSION: 1. Worsening bony metastatic disease especially perceptible in the pelvis and lumbar spine. New compression fractures at T9, T11, and L4. 2. Probable pathologic fracture in the left  ischium, nondisplaced. Worsening lytic metastatic lesions of the right superior pubic ramus and anterior wall of the right acetabulum as well as the left femoral head. 3. Enlarging left infrahilar mass/conglomerate adenopathy, 1.9 cm in diameter. Stable previously hypermetabolic left lower lobe pulmonary nodule. 4. Stable distribution of scattered hepatic metastatic lesions compared to the prior MRI. 5. Considerable atelectatic portions the right middle lobe and right lower lobe due to the right pleural effusion. These atelectatic regions were shown to harbor metastatic lesions on the prior PET-CT. 6. New extrahepatic biliary dilatation up to 1.3 cm. There is cholelithiasis and strictly speaking I can not exclude choledocholithiasis as a potential cause. Hepatobiliary sonography may be warranted. 7. Other imaging findings of potential clinical significance: Moderate right and trace left pleural effusion. Aortic Atherosclerosis (ICD10-I70.0). Electronically Signed   By: Van Clines M.D.   On: 09/09/2018 14:14    Lab Data:  CBC: Recent Labs  Lab 09/05/18 1101 09/09/18 1059  WBC 7.1 6.5  NEUTROABS 4.6 4.5  HGB 12.7 12.2  HCT 36.6 36.3  MCV 111.2* 112.0*  PLT 211 737   Basic Metabolic Panel: Recent Labs  Lab 09/05/18 1101 09/09/18 1059  NA 139 139  K 3.7 3.0*  CL 92* 88*  CO2 34* 36*  GLUCOSE 117* 142*  BUN 8 9  CREATININE 0.73 0.76  CALCIUM 11.6* 11.3*   GFR: Estimated Creatinine Clearance: 70.2 mL/min (by C-G formula based on  SCr of 0.76 mg/dL). Liver Function Tests: Recent Labs  Lab 09/05/18 1101 09/09/18 1059  AST 75* 76*  ALT 30 29  ALKPHOS 165* 171*  BILITOT 1.2 1.1  PROT 7.8 7.5  ALBUMIN 3.2* 3.0*   No results for input(s): LIPASE, AMYLASE in the last 168 hours. No results for input(s): AMMONIA in the last 168 hours. Coagulation Profile: No results for input(s): INR, PROTIME in the last 168 hours. Cardiac Enzymes: No results for input(s): CKTOTAL, CKMB,  CKMBINDEX, TROPONINI in the last 168 hours. BNP (last 3 results) No results for input(s): PROBNP in the last 8760 hours. HbA1C: No results for input(s): HGBA1C in the last 72 hours. CBG: No results for input(s): GLUCAP in the last 168 hours. Lipid Profile: No results for input(s): CHOL, HDL, LDLCALC, TRIG, CHOLHDL, LDLDIRECT in the last 72 hours. Thyroid Function Tests: No results for input(s): TSH, T4TOTAL, FREET4, T3FREE, THYROIDAB in the last 72 hours. Anemia Panel: No results for input(s): VITAMINB12, FOLATE, FERRITIN, TIBC, IRON, RETICCTPCT in the last 72 hours. Urine analysis:    Component Value Date/Time   COLORURINE DARK YELLOW 02/10/2018 1537   APPEARANCEUR TURBID (A) 02/10/2018 1537   APPEARANCEUR Clear 12/07/2013 1843   LABSPEC 1.024 02/10/2018 1537   LABSPEC 1.003 12/07/2013 1843   LABSPEC 1.020 06/27/2006 1640   PHURINE 5.5 02/10/2018 1537   GLUCOSEU 2+ (A) 02/10/2018 1537   GLUCOSEU Negative 12/07/2013 1843   HGBUR NEGATIVE 02/10/2018 1537   BILIRUBINUR NEGATIVE 08/29/2015 1517   BILIRUBINUR Negative 12/07/2013 1843   BILIRUBINUR Negative 06/27/2006 1640   KETONESUR 1+ (A) 02/10/2018 1537   PROTEINUR TRACE (A) 02/10/2018 1537   UROBILINOGEN 0.2 08/26/2014 1759   NITRITE NEGATIVE 08/29/2015 1517   LEUKOCYTESUR NEGATIVE 08/29/2015 1517   LEUKOCYTESUR Negative 12/07/2013 1843   LEUKOCYTESUR Negative 06/27/2006 1640     Almer Littleton M.D. [facility] 09/10/2018, 1:32 PM  Pager: [phone] Between 7am to 7pm - call Pager - [phone]  After 7pm go to [email or web address] - password [email or web address]  Call night coverage person covering after 7pm

## 2018-09-10 NOTE — Consult Note (Signed)
Consultation Note Date: 09/10/2018   Patient Name: Suzanne [name]  DOB: 1955/11/01  MRN: [id number]  Age / Sex: 63 y.o., female  PCP: Unk Pinto, MD Referring Physician: Mendel Corning, MD  Reason for Consultation: Establishing goals of care, Hospice Evaluation and Pain control  HPI/Patient Profile: 63 y.o. female  with past medical history of breast cancer mets to bone, lung, liver; ETOH abuse, depression, COPD, CAD, HTN, insomnia admitted on 09/09/2018 with intractable pain s/t bone mets.   Clinical Assessment and Goals of Care: I met today with Suzanne [name] and no family/visitors at bedside. She is visibly restless and in pain. Pain is in back and ribs (bone mets) and has worsened recently. She has continued on fentanyl patch but says this has not been changed since Saturday - will change today. She is anxious and has insomnia but these are exacerbated by her increased pain. She has not really utilized much of her PCA and unclear if she forgets to utilize or does not feel this is helping. She is unclear when her last BM was. She also shares that she has decided not to pursue radiation at this time.   I reassure her that we will continue to work with her pain to get it better managed and more tolerable for her. I did explain that I do not believe that her pain will go away completely. Explained the increasing pain medication for comfort and trying to balance this with not oversedating to allow her to maintain function as much as possible.   Primary Decision Maker PATIENT    SUMMARY OF RECOMMENDATIONS   - Will continue to work on better pain regimen - Plan to discuss further [place] and hospice options with patient and hospice when her pain is better controlled  Code Status/Advance Care Planning:  DNR   Symptom Management:   LBM unknown: Senokot 2 tablets daily. If no BM by 09/11/18 will add sorbitol.    Pain ribs/spine/back s/t bone mets:   Continue fentanyl 50 mcg/hr patch to be replaced today 09/10/18. Current patch has been on since Saturday??  Decadron 8 mg IV today and 4 mg IV BID beginning tomorrow.   PCA Dilaudid 1 mg IV every 30 min prn with 1 mg rescue dose.   Received dose of pamidronate 09/10/18.   Palliative Prophylaxis:   Bowel Regimen, Delirium Protocol and Frequent Pain Assessment  Additional Recommendations (Limitations, Scope, Preferences):  To be further discussed.   Psycho-social/Spiritual:   Desire for further Chaplaincy support:yes  Additional Recommendations: Caregiving  Support/Resources, Education on Hospice and Grief/Bereavement Support  Prognosis:   < 3 months  Discharge Planning: To Be Determined      Primary Diagnoses: Present on Admission: . Compression fracture of thoracic vertebra (HCC) . Bone metastases ([place]) . COPD with asthma ([place]) . GERD (gastroesophageal reflux disease) . Metastatic breast cancer ([place]) . Hypokalemia . Hypercalcemia   I have reviewed the medical record, interviewed the patient and family, and examined the patient. The following aspects are pertinent.  Past Medical  History:  Diagnosis Date  . Alcoholism ([place])   . Cancer  [facility])    breast ca - 1994; recurred in 2007. s/p masectomy with flap rconstruction aand chemo '94. local recurrence on chest wall. chemo and XRT '07. now femara  . Depression   . Dyspnea    myoview 2011: EF 55% question of  mild reverible anterior defect. thought to be breast attenuation. echo 45-50% with global HK. Grade 1 diastolyic dysfunction. RV nml. cardiac MRI with EF 44% with septal HK. no scar   . History of coronary artery stent placement   . Hyperlipidemia   . Hypertension   . Insomnia   . Lung disorder    Social History   Socioeconomic History  . Marital status: Married    Spouse name: Not on file  . Number of children: Not on file  . Years of education: Not on file  .  Highest education level: Not on file  Occupational History  . Occupation: Disabilty  Social Needs  . Financial resource strain: Not on file  . Food insecurity:    Worry: Not on file    Inability: Not on file  . Transportation needs:    Medical: Not on file    Non-medical: Not on file  Tobacco Use  . Smoking status: Never Smoker  . Smokeless tobacco: Never Used  . Tobacco comment: just socially   Substance and Sexual Activity  . Alcohol use: Yes    Comment: No alcohol since 05/22/2012  . Drug use: No  . Sexual activity: Yes    Birth control/protection: Surgical  Lifestyle  . Physical activity:    Days per week: Not on file    Minutes per session: Not on file  . Stress: Not on file  Relationships  . Social connections:    Talks on phone: Not on file    Gets together: Not on file    Attends religious service: Not on file    Active member of club or organization: Not on file    Attends meetings of clubs or organizations: Not on file    Relationship status: Not on file  Other Topics Concern  . Not on file  Social History Narrative   Suzanne [name] was born in [place] and lived there until high school, when she moved to [facility]. She graduated from the [employer] with a bachelor of science in nursing. 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 She has been married for 27 years, and has 2 children, a 6 year old son, and a 36 year old daughter. She denies any legal difficulties. She associates as a Engineer, manufacturing.    Family History  Problem Relation Age of Onset  . Hypertension Mother   . Heart attack Father   . Alcohol abuse Father   . Coronary artery disease Other        family hx  . Hyperlipidemia Other        family hx  . Thyroid disease Other        family hx   . Alcohol abuse Paternal Grandfather   . Alcohol abuse Paternal Uncle   . Cancer Paternal Grandmother        breast  . Alcohol  abuse Brother    Scheduled Meds: . [START ON 09/11/2018] dexamethasone  4 mg Intravenous Q12H  . dexamethasone  8 mg Intravenous Once  . enoxaparin (LOVENOX) injection  40 mg Subcutaneous Q24H  .  fentaNYL  1 patch Transdermal Q72H  . ipratropium-albuterol  3 mL Nebulization TID  . loratadine  10 mg Oral Daily  . LORazepam  1-2 mg Oral QHS  . pantoprazole  40 mg Oral Daily  . potassium chloride  40 mEq Oral Once   Continuous Infusions: . 0.45 % NaCl with KCl 20 mEq / L 125 mL/hr at 09/09/18 2230  . pamidronate     PRN Meds:.acetaminophen **OR** acetaminophen, albuterol, magnesium citrate, milk and molasses, ondansetron (ZOFRAN) IV, prochlorperazine **OR** prochlorperazine **OR** prochlorperazine, promethazine Allergies  Allergen Reactions  . Ace Inhibitors     cough   Review of Systems  Constitutional: Positive for activity change.  Respiratory: Positive for shortness of breath.   Musculoskeletal: Positive for back pain.  Psychiatric/Behavioral: The patient is nervous/anxious.     Physical Exam Vitals signs and nursing note reviewed.  Constitutional:      Appearance: She is ill-appearing.  Cardiovascular:     Rate and Rhythm: Normal rate.  Pulmonary:     Effort: No tachypnea, accessory muscle usage or respiratory distress.     Comments: Mild SOB at rest Abdominal:     General: Abdomen is flat.  Neurological:     Mental Status: She is alert and oriented to person, place, and time.     Comments: Restless and distracted s/t pain     Vital Signs: BP (!) 159/98 (BP Location: Left Arm)   Pulse 95 Comment: counted manually   Temp 98.9 F (37.2 C) (Oral)   Resp 18   Ht 5' 2.5" (1.588 m)   Wt 73.6 kg   SpO2 97%   BMI 29.19 kg/m  Pain Scale: 0-10 POSS *See Group Information*: 1-Acceptable,Awake and alert Pain Score: 8    SpO2: SpO2: 97 % O2 Device:SpO2: 97 % O2 Flow Rate: .O2 Flow Rate (L/min): 2 L/min  IO: Intake/output summary:   Intake/Output Summary (Last  24 hours) at 09/10/2018 1423 Last data filed at 09/10/2018 1000 Gross per 24 hour  Intake 670 ml  Output -  Net 670 ml    LBM: Last BM Date: (PTA ) Baseline Weight: Weight: 73.6 kg Most recent weight: Weight: 73.6 kg     Palliative Assessment/Data: 50%   Flowsheet Rows     Most Recent Value  Intake Tab  Referral Department  Hospitalist  Unit at Time of Referral  Oncology Unit  Palliative Care Primary Diagnosis  Cancer  Date Notified  09/10/18  Palliative Care Type  New Palliative care  Reason for referral  Non-pain Symptom, Pain  Date of Admission  09/09/18  # of days IP prior to Palliative referral  1  Clinical Assessment  Psychosocial & Spiritual Assessment  Palliative Care Outcomes      Time In: 1330 Time Out: 1440 Time Total: 70 min Greater than 50%  of this time was spent counseling and coordinating care related to the above assessment and plan.  Signed by: Vinie Sill, NP Palliative Medicine Team Pager # [phone] (M-F 8a-5p) Team Phone # [phone] (Nights/Weekends)

## 2018-09-11 DIAGNOSIS — Z7189 Other specified counseling: Secondary | ICD-10-CM

## 2018-09-11 DIAGNOSIS — S22000A Wedge compression fracture of unspecified thoracic vertebra, initial encounter for closed fracture: Secondary | ICD-10-CM

## 2018-09-11 LAB — CBC
HEMATOCRIT: 41.2 % (ref 36.0–46.0)
Hemoglobin: 13.9 g/dL (ref 12.0–15.0)
MCH: 38 pg — AB (ref 26.0–34.0)
MCHC: 33.7 g/dL (ref 30.0–36.0)
MCV: 112.6 fL — ABNORMAL HIGH (ref 80.0–100.0)
Platelets: 242 10*3/uL (ref 150–400)
RBC: 3.66 MIL/uL — ABNORMAL LOW (ref 3.87–5.11)
RDW: 14.9 % (ref 11.5–15.5)
WBC: 6.6 10*3/uL (ref 4.0–10.5)
nRBC: 0 % (ref 0.0–0.2)

## 2018-09-11 LAB — COMPREHENSIVE METABOLIC PANEL
ALT: 33 U/L (ref 0–44)
AST: 81 U/L — ABNORMAL HIGH (ref 15–41)
Albumin: 3.4 g/dL — ABNORMAL LOW (ref 3.5–5.0)
Alkaline Phosphatase: 149 U/L — ABNORMAL HIGH (ref 38–126)
Anion gap: 15 (ref 5–15)
BILIRUBIN TOTAL: 1.3 mg/dL — AB (ref 0.3–1.2)
BUN: 5 mg/dL — ABNORMAL LOW (ref 8–23)
CO2: 28 mmol/L (ref 22–32)
Calcium: 10.2 mg/dL (ref 8.9–10.3)
Chloride: 93 mmol/L — ABNORMAL LOW (ref 98–111)
Creatinine, Ser: 0.44 mg/dL (ref 0.44–1.00)
GFR calc non Af Amer: >60 mL/min (ref >60)
Glucose, Bld: 165 mg/dL — ABNORMAL HIGH (ref 70–99)
Potassium: 3.2 mmol/L — ABNORMAL LOW (ref 3.5–5.1)
Sodium: 136 mmol/L (ref 135–145)
Total Protein: 7.8 g/dL (ref 6.5–8.1)

## 2018-09-11 MED ORDER — POTASSIUM CHLORIDE CRYS ER 20 MEQ PO TBCR
40.0000 meq | EXTENDED_RELEASE_TABLET | Freq: Once | ORAL | Status: AC
  Administered 2018-09-11: 40 meq via ORAL
  Filled 2018-09-11: qty 2

## 2018-09-11 NOTE — Progress Notes (Signed)
MEWS score of 2 due to pulse rate elevated MD aware, patient being followed by palliative.

## 2018-09-11 NOTE — Progress Notes (Signed)
[facility]                                                                              Patient Demographics  Suzanne [name], is a 63 y.o. female, DOB - 12-10-1955, AYT:[id number]  Admit date - 09/09/2018   Admitting Physician Sid Falcon, MD  Outpatient Primary MD for the patient is Unk Pinto, MD  Outpatient specialists:   LOS - 2  days   Medical records reviewed and are as summarized below:    No chief complaint on file.      Brief summary   Suzanne [name] is a 63 y.o. female with medical history significant of EtOH abuse, metastatic breast cancer, depression, COPD, CAD, history of stent placement, hyperlipidemia, hypertension, insomnia  who was sent from cancer center due to progressively worse back pain since last week after the patient had a fall while trying going to the bathroom in her house at night with very daily illumination.  She denied head trauma or LOC.    CT abdomen pelvis showed worsening bony metastatic disease specially in the pelvis and lumbar spine, new compression fractures T9, T11, L4.  Probable pathological fracture in the left ischium, nondisplaced, worsening lytic metastatic lesions in the right superior pubic rami and anterior wall of right establishment as well as the left femoral head.  Assessment & Plan    Principal Problem: Intractable pain secondary to worsening bony metastatic disease in the thoracic, lumbar spine, pelvis  - CT abdomen pelvis showed worsening bony metastatic disease specially in the pelvis and lumbar spine, new compression fractures T9, T11, L4.  Probable pathological fracture in the left ischium, nondisplaced, worsening lytic metastatic lesions in the right superior pubic rami and anterior wall of right establishment as well as the left femoral head. -Patient was placed on Dilaudid PCA,, fentanyl patch, started on Decadron, highly appreciate palliative medicine for assisting with pain management.   -Patient  has declined radiation to the bony metastasis -Continue focus on pain control and comfort   Active Problems: Metastatic right breast cancer -Per oncology, further chemotherapy would be futile  -Focus now on comfort care and pain control.  Once good regimen established, DC home with hospice    GERD (gastroesophageal reflux disease) -Continue PPI    COPD with asthma ([place]) -Better today, continue scheduled duo nebs and albuterol as needed, continue supplemental O2   Hypokalemia -Potassium 3.2, replaced  Hypercalcemia -Status post 1 dose of Aredia, now on Decadron, calcium improving  Code Status: DNR status DVT Prophylaxis:  Lovenox Family Communication: Discussed in detail with the patient, all imaging results, lab results explained to the patient    Disposition Plan: Once pain is better controlled and regimen established, will DC home with hospice.  Currently on PCA  Time Spent in minutes 25 minutes  Procedures:  CT abdomen and pelvis  Consultants:   Oncology  Antimicrobials:   Anti-infectives (From admission, onward)   None         Medications  Scheduled Meds: . dexamethasone  4 mg Intravenous Q12H  . enoxaparin (LOVENOX) injection  40 mg Subcutaneous Q24H  . fentaNYL  1  patch Transdermal Q72H  . HYDROmorphone   Intravenous Q4H  . ipratropium-albuterol  3 mL Nebulization TID  . loratadine  10 mg Oral Daily  . LORazepam  1-2 mg Oral QHS  . pantoprazole  40 mg Oral Daily  . senna  2 tablet Oral QHS   Continuous Infusions: . 0.45 % NaCl with KCl 20 mEq / L 125 mL/hr at 09/11/18 1249   PRN Meds:.acetaminophen **OR** acetaminophen, albuterol, LORazepam, magnesium citrate, ondansetron (ZOFRAN) IV, prochlorperazine **OR** prochlorperazine **OR** prochlorperazine, promethazine      Subjective:   Suzanne [name] was seen and examined today.  Feels better today, conversing, on PCA and IV steroids.  Patient denies dizziness, chest pain, shortness of breath,  abdominal pain.    Objective:   Vitals:   09/11/18 0645 09/11/18 0816 09/11/18 0958 09/11/18 1144  BP: (!) 143/111     Pulse: (!) 109     Resp: 20 (!) 23  (!) 22  Temp: 97.9 F (36.6 C)     TempSrc: Oral     SpO2: 95% 90% 91% 95%  Weight:      Height:        Intake/Output Summary (Last 24 hours) at 09/11/2018 1319 Last data filed at 09/11/2018 0645 Gross per 24 hour  Intake 1450 ml  Output 550 ml  Net 900 ml     Wt Readings from Last 3 Encounters:  09/09/18 73.6 kg  09/09/18 73.6 kg  09/05/18 74.1 kg    Physical Exam  General: Alert and oriented x 3, NAD, ill-appearing  Eyes:   HEENT:   Cardiovascular: S1 S2 clear, no murmurs, RRR. No pedal edema b/l  Respiratory: Mild expiratory wheezing, better from yesterday  Gastrointestinal: Soft, nontender, nondistended, NBS  Ext: no pedal edema bilaterally  Neuro: no new deficits  Musculoskeletal: No cyanosis, clubbing  Skin: No rashes  Psych: Normal affect and demeanor, alert and oriented x3    Data Reviewed:  I have personally reviewed following labs and imaging studies  Micro Results No results found for this or any previous visit (from the past 240 hour(s)).  Radiology Reports Dg Chest 2 View  Result Date: 09/05/2018 CLINICAL DATA:  History of breast cancer with wheezing and shortness of breath. EXAM: CHEST - 2 VIEW COMPARISON:  June 25, 2016 FINDINGS: The heart size and mediastinal contours are stable. Moderate to large right pleural effusion with consolidation of right mid to lung base are noted. The left lung is clear. The visualized skeletal structures are stable. IMPRESSION: Moderate to large right pleural effusion with consolidation of right mid to lung base are noted. Underlying pneumonia is not excluded. Electronically Signed   By: Abelardo Diesel M.D.   On: 09/05/2018 13:34   Ct Chest W Contrast  Result Date: 09/09/2018 CLINICAL DATA:  Metastatic breast cancer currently on Xeloda palliative  chemotherapy. Worsening dyspnea. Bone pain. EXAM: CT CHEST, ABDOMEN, AND PELVIS WITH CONTRAST TECHNIQUE: Multidetector CT imaging of the chest, abdomen and pelvis was performed following the standard protocol during bolus administration of intravenous contrast. CONTRAST:  188mL OMNIPAQUE IOHEXOL 300 MG/ML  SOLN COMPARISON:  PET-CT 07/28/2018 FINDINGS: CT CHEST FINDINGS Cardiovascular: Unremarkable Mediastinum/Nodes: Left infrahilar node or nodule measuring 1.9 by 1.9 cm on image 25/2 is observed. There were previously smaller hypermetabolic lymph nodes in this vicinity on 07/28/2018. Lungs/Pleura: Moderate right pleural effusion without a well-defined pleural mass, but potentially with some mild loculation. Considerable atelectatic portions of the right middle lobe and right lower lobe which previously had  hypermetabolic elements compatible with underlying metastatic lesions. A left lower lobe nodule was previously hypermetabolic and currently measures 1.2 cm in short axis thickness on image 83/6, formerly the same on prior PET-CT where the lesion was hypermetabolic. Trace left pleural effusion. Musculoskeletal: Widespread osseous metastatic disease is primarily sclerotic. Compared to 03/31/2018, there is a new 40% compression fracture at T9 and a new 15% superior endplate compression fracture at T11. Old healed sternal fracture. Primarily similar distribution of osseous metastatic disease compared to previous. Reconstruction related findings in the right breast, including a fluid collection along the right pectoralis muscle. CT ABDOMEN PELVIS FINDINGS Hepatobiliary: Scattered hepatic metastatic lesions are similar in distribution to the 07/28/2018 exam, but increased from the 03/31/2018 diagnostic CT examination. Cholelithiasis present potentially with mild gallbladder wall thickening. Extrahepatic biliary dilatation with CBD at 1.3 cm. This is significantly increased from 07/28/2018, raise the possibility of  choledocholithiasis. No overt intrahepatic biliary dilatation. An index segment 4 enhancing metastatic lesion measures 1.5 by 1.5 cm on image 34/2. Pancreas: Unremarkable Spleen: Unremarkable Adrenals/Urinary Tract: Unremarkable Stomach/Bowel: Unremarkable Vascular/Lymphatic: Mild aortoiliac atherosclerotic vascular calcification. Reproductive: Uterus absent.  Adnexa unremarkable. Other: No supplemental non-categorized findings. Musculoskeletal: Prior left tram flap procedure. Worsening metastatic disease in the left ischium with periostitis and suspicion for a small nondisplaced pathologic fracture of the left ischium on image 116/2. Worsening lytic lesions of the right superior pubic ramus and anterior wall of the right acetabulum. New 1.7 cm lytic lesion of the left femoral head. Widespread and increased sclerotic osseous metastatic disease in the bony pelvis. New periosteal reaction along the posterior left iliac bone likely related to a lytic mass on image 85/2. Metastatic burden in the lumbar spine has likewise mildly increased. New superior endplate compression fracture and inferior endplate compression fracture at the L4 level. IMPRESSION: 1. Worsening bony metastatic disease especially perceptible in the pelvis and lumbar spine. New compression fractures at T9, T11, and L4. 2. Probable pathologic fracture in the left ischium, nondisplaced. Worsening lytic metastatic lesions of the right superior pubic ramus and anterior wall of the right acetabulum as well as the left femoral head. 3. Enlarging left infrahilar mass/conglomerate adenopathy, 1.9 cm in diameter. Stable previously hypermetabolic left lower lobe pulmonary nodule. 4. Stable distribution of scattered hepatic metastatic lesions compared to the prior MRI. 5. Considerable atelectatic portions the right middle lobe and right lower lobe due to the right pleural effusion. These atelectatic regions were shown to harbor metastatic lesions on the prior  PET-CT. 6. New extrahepatic biliary dilatation up to 1.3 cm. There is cholelithiasis and strictly speaking I can not exclude choledocholithiasis as a potential cause. Hepatobiliary sonography may be warranted. 7. Other imaging findings of potential clinical significance: Moderate right and trace left pleural effusion. Aortic Atherosclerosis (ICD10-I70.0). Electronically Signed   By: Van Clines M.D.   On: 09/09/2018 14:14   Ct Abdomen Pelvis W Contrast  Result Date: 09/09/2018 CLINICAL DATA:  Metastatic breast cancer currently on Xeloda palliative chemotherapy. Worsening dyspnea. Bone pain. EXAM: CT CHEST, ABDOMEN, AND PELVIS WITH CONTRAST TECHNIQUE: Multidetector CT imaging of the chest, abdomen and pelvis was performed following the standard protocol during bolus administration of intravenous contrast. CONTRAST:  121mL OMNIPAQUE IOHEXOL 300 MG/ML  SOLN COMPARISON:  PET-CT 07/28/2018 FINDINGS: CT CHEST FINDINGS Cardiovascular: Unremarkable Mediastinum/Nodes: Left infrahilar node or nodule measuring 1.9 by 1.9 cm on image 25/2 is observed. There were previously smaller hypermetabolic lymph nodes in this vicinity on 07/28/2018. Lungs/Pleura: Moderate right pleural effusion without a well-defined  pleural mass, but potentially with some mild loculation. Considerable atelectatic portions of the right middle lobe and right lower lobe which previously had hypermetabolic elements compatible with underlying metastatic lesions. A left lower lobe nodule was previously hypermetabolic and currently measures 1.2 cm in short axis thickness on image 83/6, formerly the same on prior PET-CT where the lesion was hypermetabolic. Trace left pleural effusion. Musculoskeletal: Widespread osseous metastatic disease is primarily sclerotic. Compared to 03/31/2018, there is a new 40% compression fracture at T9 and a new 15% superior endplate compression fracture at T11. Old healed sternal fracture. Primarily similar distribution  of osseous metastatic disease compared to previous. Reconstruction related findings in the right breast, including a fluid collection along the right pectoralis muscle. CT ABDOMEN PELVIS FINDINGS Hepatobiliary: Scattered hepatic metastatic lesions are similar in distribution to the 07/28/2018 exam, but increased from the 03/31/2018 diagnostic CT examination. Cholelithiasis present potentially with mild gallbladder wall thickening. Extrahepatic biliary dilatation with CBD at 1.3 cm. This is significantly increased from 07/28/2018, raise the possibility of choledocholithiasis. No overt intrahepatic biliary dilatation. An index segment 4 enhancing metastatic lesion measures 1.5 by 1.5 cm on image 34/2. Pancreas: Unremarkable Spleen: Unremarkable Adrenals/Urinary Tract: Unremarkable Stomach/Bowel: Unremarkable Vascular/Lymphatic: Mild aortoiliac atherosclerotic vascular calcification. Reproductive: Uterus absent.  Adnexa unremarkable. Other: No supplemental non-categorized findings. Musculoskeletal: Prior left tram flap procedure. Worsening metastatic disease in the left ischium with periostitis and suspicion for a small nondisplaced pathologic fracture of the left ischium on image 116/2. Worsening lytic lesions of the right superior pubic ramus and anterior wall of the right acetabulum. New 1.7 cm lytic lesion of the left femoral head. Widespread and increased sclerotic osseous metastatic disease in the bony pelvis. New periosteal reaction along the posterior left iliac bone likely related to a lytic mass on image 85/2. Metastatic burden in the lumbar spine has likewise mildly increased. New superior endplate compression fracture and inferior endplate compression fracture at the L4 level. IMPRESSION: 1. Worsening bony metastatic disease especially perceptible in the pelvis and lumbar spine. New compression fractures at T9, T11, and L4. 2. Probable pathologic fracture in the left ischium, nondisplaced. Worsening lytic  metastatic lesions of the right superior pubic ramus and anterior wall of the right acetabulum as well as the left femoral head. 3. Enlarging left infrahilar mass/conglomerate adenopathy, 1.9 cm in diameter. Stable previously hypermetabolic left lower lobe pulmonary nodule. 4. Stable distribution of scattered hepatic metastatic lesions compared to the prior MRI. 5. Considerable atelectatic portions the right middle lobe and right lower lobe due to the right pleural effusion. These atelectatic regions were shown to harbor metastatic lesions on the prior PET-CT. 6. New extrahepatic biliary dilatation up to 1.3 cm. There is cholelithiasis and strictly speaking I can not exclude choledocholithiasis as a potential cause. Hepatobiliary sonography may be warranted. 7. Other imaging findings of potential clinical significance: Moderate right and trace left pleural effusion. Aortic Atherosclerosis (ICD10-I70.0). Electronically Signed   By: Van Clines M.D.   On: 09/09/2018 14:14    Lab Data:  CBC: Recent Labs  Lab 09/05/18 1101 09/09/18 1059 09/11/18 0341  WBC 7.1 6.5 6.6  NEUTROABS 4.6 4.5  --   HGB 12.7 12.2 13.9  HCT 36.6 36.3 41.2  MCV 111.2* 112.0* 112.6*  PLT 211 220 025   Basic Metabolic Panel: Recent Labs  Lab 09/05/18 1101 09/09/18 1059 09/11/18 0341  NA 139 139 136  K 3.7 3.0* 3.2*  CL 92* 88* 93*  CO2 34* 36* 28  GLUCOSE 117* 142*  165*  BUN 8 9 5*  CREATININE 0.73 0.76 0.44  CALCIUM 11.6* 11.3* 10.2   GFR: Estimated Creatinine Clearance: 70.2 mL/min (by C-G formula based on SCr of 0.44 mg/dL). Liver Function Tests: Recent Labs  Lab 09/05/18 1101 09/09/18 1059 09/11/18 0341  AST 75* 76* 81*  ALT 30 29 33  ALKPHOS 165* 171* 149*  BILITOT 1.2 1.1 1.3*  PROT 7.8 7.5 7.8  ALBUMIN 3.2* 3.0* 3.4*   No results for input(s): LIPASE, AMYLASE in the last 168 hours. No results for input(s): AMMONIA in the last 168 hours. Coagulation Profile: No results for input(s):  INR, PROTIME in the last 168 hours. Cardiac Enzymes: No results for input(s): CKTOTAL, CKMB, CKMBINDEX, TROPONINI in the last 168 hours. BNP (last 3 results) No results for input(s): PROBNP in the last 8760 hours. HbA1C: No results for input(s): HGBA1C in the last 72 hours. CBG: No results for input(s): GLUCAP in the last 168 hours. Lipid Profile: No results for input(s): CHOL, HDL, LDLCALC, TRIG, CHOLHDL, LDLDIRECT in the last 72 hours. Thyroid Function Tests: No results for input(s): TSH, T4TOTAL, FREET4, T3FREE, THYROIDAB in the last 72 hours. Anemia Panel: No results for input(s): VITAMINB12, FOLATE, FERRITIN, TIBC, IRON, RETICCTPCT in the last 72 hours. Urine analysis:    Component Value Date/Time   COLORURINE DARK YELLOW 02/10/2018 1537   APPEARANCEUR TURBID (A) 02/10/2018 1537   APPEARANCEUR Clear 12/07/2013 1843   LABSPEC 1.024 02/10/2018 1537   LABSPEC 1.003 12/07/2013 1843   LABSPEC 1.020 06/27/2006 1640   PHURINE 5.5 02/10/2018 1537   GLUCOSEU 2+ (A) 02/10/2018 1537   GLUCOSEU Negative 12/07/2013 1843   HGBUR NEGATIVE 02/10/2018 1537   BILIRUBINUR NEGATIVE 08/29/2015 1517   BILIRUBINUR Negative 12/07/2013 1843   BILIRUBINUR Negative 06/27/2006 1640   KETONESUR 1+ (A) 02/10/2018 1537   PROTEINUR TRACE (A) 02/10/2018 1537   UROBILINOGEN 0.2 08/26/2014 1759   NITRITE NEGATIVE 08/29/2015 1517   LEUKOCYTESUR NEGATIVE 08/29/2015 1517   LEUKOCYTESUR Negative 12/07/2013 1843   LEUKOCYTESUR Negative 06/27/2006 1640       M.D. [facility] 09/11/2018, 1:19 PM  Pager: [phone] Between 7am to 7pm - call Pager - [phone]  After 7pm go to [email or web address] - password [email or web address]  Call night coverage person covering after 7pm

## 2018-09-11 NOTE — Progress Notes (Signed)
Daily Progress Note   Patient Name: Suzanne [name]       Date: 09/11/2018 DOB: [birth year]  Age: 63 y.o. MRN#: [id number] Attending Physician: Mendel Corning, MD Primary Care Physician: Unk Pinto, MD Admit Date: 09/09/2018  Reason for Consultation/Follow-up: Establishing goals of care and Pain control  Subjective: I met today with Suzanne [name].  She reports that pain is better controlled than admission since starting PCA.  Discussed that her goal it to be at home and as comfortable as possible.    I reviewed her PCA usage and she has used 90m in the last 24 hours.  Reviewed her pain and it seems as though a lot of this is incident pain related to any sort of movement.  She also complains about PCA beeping constantly.  Length of Stay: 2  Current Medications: Scheduled Meds:  . dexamethasone  4 mg Intravenous Q12H  . enoxaparin (LOVENOX) injection  40 mg Subcutaneous Q24H  . fentaNYL  1 patch Transdermal Q72H  . HYDROmorphone   Intravenous Q4H  . ipratropium-albuterol  3 mL Nebulization TID  . loratadine  10 mg Oral Daily  . LORazepam  1-2 mg Oral QHS  . pantoprazole  40 mg Oral Daily  . senna  2 tablet Oral QHS    Continuous Infusions: . 0.45 % NaCl with KCl 20 mEq / L 125 mL/hr at 09/11/18 1249    PRN Meds: acetaminophen **OR** acetaminophen, albuterol, LORazepam, magnesium citrate, ondansetron (ZOFRAN) IV, prochlorperazine **OR** prochlorperazine **OR** prochlorperazine, promethazine  Physical Exam    General: Sleepy, in no acute distress.  HEENT: No bruits, no goiter, no JVD Heart: Tachycardic. No murmur appreciated. Lungs: Good air movement, clear Abdomen: Soft, nontender, nondistended, positive bowel sounds.  Ext: No significant edema Skin: Warm and dry Neuro:  Grossly intact, nonfocal.       Vital Signs: BP 137/86 (BP Location: Right Arm)   Pulse (!) 112   Temp 97.9 F (36.6 C) (Oral)   Resp 19   Ht 5' 2.5" (1.588 m)   Wt 73.6 kg   SpO2 95%   BMI 29.19 kg/m  SpO2: SpO2: 95 % O2 Device: O2 Device: Nasal Cannula O2 Flow Rate: O2 Flow Rate (L/min): 4 L/min  Intake/output summary:   Intake/Output Summary (Last 24 hours) at 09/11/2018 1830 Last data filed  at 09/11/2018 1800 Gross per 24 hour  Intake 3271.06 ml  Output 550 ml  Net 2721.06 ml   LBM: Last BM Date: (PTA) Baseline Weight: Weight: 73.6 kg Most recent weight: Weight: 73.6 kg       Palliative Assessment/Data:    Flowsheet Rows     Most Recent Value  Intake Tab  Referral Department  Hospitalist  Unit at Time of Referral  Oncology Unit  Palliative Care Primary Diagnosis  Cancer  Date Notified  09/10/18  Palliative Care Type  New Palliative care  Reason for referral  Non-pain Symptom, Pain  Date of Admission  09/09/18  # of days IP prior to Palliative referral  1  Clinical Assessment  Psychosocial & Spiritual Assessment  Palliative Care Outcomes      Patient Active Problem List   Diagnosis Date Noted  . Cancer associated pain   . Compression fracture of thoracic vertebra ([place]) 09/09/2018  . Hypokalemia 09/09/2018  . Hypercalcemia 09/09/2018  . Metastatic breast cancer ([place]) 06/30/2018  . Insulin resistance 06/30/2018  . Vitamin D deficiency 06/30/2018  . Hyperlipidemia, mixed 06/30/2018  . Elevated BP without diagnosis of hypertension 06/30/2018  . Elevated serum glucose 02/09/2018  . Encounter for general adult medical examination with abnormal findings 12/15/2017  . Chronic hypoxemic respiratory failure ([place]) 05/29/2017  . Atelectasis of right lung 05/29/2017  . Genetic testing 05/31/2016  . Bone metastases ([place]) 05/07/2016  . Lung metastases ([place]) 05/07/2016  . 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 Prediabetes 08/26/2014  . Systolic dysfunction  85/27/7824  . GERD (gastroesophageal reflux disease) 08/26/2014  . Overweight (BMI 25.0-29.9) 08/26/2014  . Lung nodules 08/26/2014  . Insomnia 08/26/2014  . RLS (restless legs syndrome) 08/26/2014  . Breast cancer of lower-outer quadrant of right female breast ([place]) 07/15/2012  . Essential hypertension   . Benzodiazepine dependence ([place]) 05/24/2012    Class: Chronic  . PVC (premature ventricular contraction) 11/29/2009  . Chronic cough 07/05/2008  . DYSPNEA 08/15/2007    Palliative Care Assessment & Plan   Patient Profile: 63 y.o. female  with past medical history of breast cancer mets to bone, lung, liver; ETOH abuse, depression, COPD, CAD, HTN, insomnia admitted on 09/09/2018 with intractable pain s/t bone mets.   Recommendations/Plan:  Pain: Continue PCA.  With goal of being home with hospice with good pain control and largely incident pain, my recommendation would be to pursue hospice with PCA at home.  This would require placement of PICC prior to discharge.  I suggested this to [name] to consider and she stated that she thinks there are "too many cooks in the kitchen" and does not want me to make any changes today while she considers.  I also discussed with her possibility of d/c of ETCO2 and pulse ox on PCA as goal is palliative.  She again asked I not make any changes to anything today.  I asked her to consider if she would like to try to get home with hospice with PCA (this would be quickest path to transition home in my opinion) or continue to try to titrate oral medications as well as fentanyl patch (likely take a couple more days and still will be more limited in the relief of incident pain with movement).  Will follow up tomorrow.  Goals of Care and Additional Recommendations:  Limitations on Scope of Treatment: Avoid Hospitalization  Code Status:    Code Status Orders  (From admission, onward)  Start     Ordered   09/09/18 1833  Do not attempt  resuscitation (DNR)  Continuous    Question Answer Comment  In the event of cardiac or respiratory ARREST Do not call a "code blue"   In the event of cardiac or respiratory ARREST Do not perform Intubation, CPR, defibrillation or ACLS   In the event of cardiac or respiratory ARREST Use medication by any route, position, wound care, and other measures to relive pain and suffering. May use oxygen, suction and manual treatment of airway obstruction as needed for comfort.      09/09/18 1836        Code Status History    Date Active Date Inactive Code Status Order ID Comments User Context   09/09/2018 1829 09/09/2018 1836 DNR [id number]  Reubin Milan, MD Inpatient   05/21/2012 1949 05/22/2012 1904 Full Code [id number]  Kathalene Frames, MD ED    Advance Directive Documentation     Most Recent Value  Type of Advance Directive  Healthcare Power of Attorney [pt did not]  Pre-existing out of facility DNR order (yellow form or pink MOST form)  -  "MOST" Form in Place?  -       Prognosis:   < 3 months  Discharge Planning:  Home with Hospice?  Care plan was discussed with patient, RN  Thank you for allowing the Palliative Medicine Team to assist in the care of this patient.   Total Time 40 Prolonged Time Billed No      Greater than 50%  of this time was spent counseling and coordinating care related to the above assessment and plan.  Micheline Rough, MD  Please contact Palliative Medicine Team phone at [phone] for questions and concerns.

## 2018-09-11 NOTE — Progress Notes (Signed)
HEMATOLOGY-ONCOLOGY PROGRESS NOTE  SUBJECTIVE: Continued significant pain issues although slightly better than yesterday.  Continues to be on Dilaudid PCA pump.  OBJECTIVE: REVIEW OF SYSTEMS:   Constitutional: Significant distress from the pain Eyes: Denies blurriness of vision Ears, nose, mouth, throat, and face: Denies mucositis or sore throat Respiratory: Significant wheezing and severe shortness of breath requiring oxygen Cardiovascular: Denies palpitation, chest discomfort Gastrointestinal:  Denies nausea, heartburn or change in bowel habits Skin: Denies abnormal skin rashes Lymphatics: Denies new lymphadenopathy or easy bruising Neurological:Denies numbness, tingling or new weaknesses Behavioral/Psych: Mood is stable, no new changes  Extremities: No lower extremity edema   PHYSICAL EXAMINATION: ECOG PERFORMANCE STATUS: 4 - Bedbound  Vitals:   09/11/18 0958 09/11/18 1144  BP:    Pulse:    Resp:  (!) 22  Temp:    SpO2: 91% 95%   Filed Weights   09/09/18 1855  Weight: 73.6 kg    GENERAL:alert, no distress and comfortable SKIN: skin color, texture, turgor are normal, no rashes or significant lesions EYES: normal, Conjunctiva are pink and non-injected, sclera clear OROPHARYNX:no exudate, no erythema and lips, buccal mucosa, and tongue normal  NECK: supple, thyroid normal size, non-tender, without nodularity LYMPH:  no palpable lymphadenopathy in the cervical, axillary or inguinal LUNGS: Wheezing and dyspnea hEART: regular rate & rhythm and no murmurs and no lower extremity edema ABDOMEN:abdomen soft, non-tender and normal bowel sounds Musculoskeletal:no cyanosis of digits and no clubbing  NEURO: alert & oriented x 3 with fluent speech, no focal motor/sensory deficits  LABORATORY DATA:  I have reviewed the data as listed CMP Latest Ref Rng & Units 09/11/2018 09/09/2018 09/05/2018  Glucose 70 - 99 mg/dL 165(H) 142(H) 117(H)  BUN 8 - 23 mg/dL 5(L) 9 8  Creatinine 0.44 -  1.00 mg/dL 0.44 0.76 0.73  Sodium 135 - 145 mmol/L 136 139 139  Potassium 3.5 - 5.1 mmol/L 3.2(L) 3.0(LL) 3.7  Chloride 98 - 111 mmol/L 93(L) 88(L) 92(L)  CO2 22 - 32 mmol/L 28 36(H) 34(H)  Calcium 8.9 - 10.3 mg/dL 10.2 11.3(H) 11.6(H)  Total Protein 6.5 - 8.1 g/dL 7.8 7.5 7.8  Total Bilirubin 0.3 - 1.2 mg/dL 1.3(H) 1.1 1.2  Alkaline Phos 38 - 126 U/L 149(H) 171(H) 165(H)  AST 15 - 41 U/L 81(H) 76(H) 75(H)  ALT 0 - 44 U/L 33 29 30    Lab Results  Component Value Date   WBC 6.6 09/11/2018   HGB 13.9 09/11/2018   HCT 41.2 09/11/2018   MCV 112.6 (H) 09/11/2018   PLT 242 09/11/2018   NEUTROABS 4.5 09/09/2018    ASSESSMENT AND PLAN: Metastatic breast cancer: Palliative/hospice care I spent some time discussing with the patient about the overall progress in the hospital.  Pain appears to be slightly better but overall she expressed to me that she wishes that she could pass away sooner and go through hospice care and that she does not want this process to be dragged on for a long time.  Explained to her that the goal of hospice is to make her end of days comfortable and that whatever time she has her goal is to make sure that she does not suffer too much.  I will be leaving town.  If there are any further questions please do not hesitate to call the on-call physician.

## 2018-09-12 ENCOUNTER — Inpatient Hospital Stay: Payer: Self-pay

## 2018-09-12 LAB — CBC
HCT: 38.1 % (ref 36.0–46.0)
Hemoglobin: 12.5 g/dL (ref 12.0–15.0)
MCH: 36.4 pg — ABNORMAL HIGH (ref 26.0–34.0)
MCHC: 32.8 g/dL (ref 30.0–36.0)
MCV: 111.1 fL — ABNORMAL HIGH (ref 80.0–100.0)
Platelets: 210 10*3/uL (ref 150–400)
RBC: 3.43 MIL/uL — ABNORMAL LOW (ref 3.87–5.11)
RDW: 15.3 % (ref 11.5–15.5)
WBC: 7.8 10*3/uL (ref 4.0–10.5)
nRBC: 0 % (ref 0.0–0.2)

## 2018-09-12 LAB — BASIC METABOLIC PANEL
ANION GAP: 13 (ref 5–15)
BUN: 10 mg/dL (ref 8–23)
CO2: 27 mmol/L (ref 22–32)
Calcium: 9.3 mg/dL (ref 8.9–10.3)
Chloride: 95 mmol/L — ABNORMAL LOW (ref 98–111)
Creatinine, Ser: 0.47 mg/dL (ref 0.44–1.00)
GFR calc Af Amer: >60 mL/min (ref >60)
GFR calc non Af Amer: >60 mL/min (ref >60)
Glucose, Bld: 194 mg/dL — ABNORMAL HIGH (ref 70–99)
Potassium: 3.4 mmol/L — ABNORMAL LOW (ref 3.5–5.1)
Sodium: 135 mmol/L (ref 135–145)

## 2018-09-12 MED ORDER — POTASSIUM CHLORIDE CRYS ER 20 MEQ PO TBCR
40.0000 meq | EXTENDED_RELEASE_TABLET | Freq: Once | ORAL | Status: AC
  Administered 2018-09-12: 40 meq via ORAL
  Filled 2018-09-12: qty 2

## 2018-09-12 MED ORDER — SODIUM CHLORIDE 0.9% FLUSH
10.0000 mL | Freq: Two times a day (BID) | INTRAVENOUS | Status: DC
  Administered 2018-09-12 – 2018-09-14 (×3): 10 mL

## 2018-09-12 MED ORDER — MAGNESIUM CITRATE PO SOLN
1.0000 | Freq: Once | ORAL | Status: AC
  Administered 2018-09-12: 1 via ORAL
  Filled 2018-09-12: qty 296

## 2018-09-12 MED ORDER — IPRATROPIUM-ALBUTEROL 0.5-2.5 (3) MG/3ML IN SOLN: 3.0000 mL | Freq: Four times a day (QID) | RESPIRATORY_TRACT | Status: DC

## 2018-09-12 MED ORDER — SODIUM CHLORIDE 0.9% FLUSH: 10.0000 mL | INTRAVENOUS | Status: DC | PRN

## 2018-09-12 MED ORDER — SODIUM CHLORIDE 0.9 % IV SOLN
INTRAVENOUS | Status: DC | PRN
  Administered 2018-09-12: 500 mL via INTRAVENOUS

## 2018-09-12 MED ORDER — IPRATROPIUM-ALBUTEROL 0.5-2.5 (3) MG/3ML IN SOLN
3.0000 mL | Freq: Four times a day (QID) | RESPIRATORY_TRACT | Status: DC
  Administered 2018-09-13 – 2018-09-14 (×5): 3 mL via RESPIRATORY_TRACT
  Filled 2018-09-12 (×6): qty 3

## 2018-09-12 NOTE — Care Management Important Message (Signed)
Important Message  Patient Details  Name: CYTHINA MICKELSEN MRN: [id number] Date of Birth: 07/14/1956   Medicare Important Message Given:  Yes    Kerin Salen 09/12/2018, 11:29 [place] Message  Patient Details  Name: DONEISHA IVEY MRN: [id number] Date of Birth: 20-Oct-1955   Medicare Important Message Given:  Yes    Kerin Salen 09/12/2018, 11:28 AM

## 2018-09-12 NOTE — Care Management (Addendum)
This CM was asked verbally to offer choice for home hospice services by PMT MD. Choice offered to pt and [facility] chosen. This CM confirmed with [facility] liaison that they would be able to accommodate for her to have a PCA at home. Referral given to [facility] liaison. Pt will need PICC line placement for home PCA. Marney Doctor RN,BSN [phone]

## 2018-09-12 NOTE — Progress Notes (Signed)
Peripherally Inserted Central Catheter/Midline Placement  The IV Nurse has discussed with the patient and/or persons authorized to consent for the patient, the purpose of this procedure and the potential benefits and risks involved with this procedure.  The benefits include less needle sticks, lab draws from the catheter, and the patient may be discharged home with the catheter. Risks include, but not limited to, infection, bleeding, blood clot (thrombus formation), and puncture of an artery; nerve damage and irregular heartbeat and possibility to perform a PICC exchange if needed/ordered by physician.  Alternatives to this procedure were also discussed.  Bard Power PICC patient education guide, fact sheet on infection prevention and patient information card has been provided to patient /or left at bedside.    PICC/Midline Placement Documentation        Suzanne [name] 09/12/2018, 1:44 PM

## 2018-09-12 NOTE — Progress Notes (Signed)
MEWS Guidelines - (patients age 62 and over)  Red - At High Risk for Deterioration Yellow - At risk for Deterioration  1. Go to room and assess patient 2. Validate data. Is this patient's baseline? If data confirmed: 3. Is this an acute change? 4. Administer prn meds/treatments as ordered. 5. Note Sepsis score 6. Review goals of care 7. Sports coach, RRT nurse and Provider. 8. Ask Provider to come to bedside.  9. Document patient condition/interventions/response. 10. Increase frequency of vital signs and focused assessments to at least q15 minutes x 4, then q30 minutes x2. - If stable, then q1h x3, then q4h x3 and then q8h or dept. routine. - If unstable, contact Provider & RRT nurse. Prepare for possible transfer. 11. Add entry in progress notes using the smart phrase ".MEWS". 1. Go to room and assess patient 2. Validate data. Is this patient's baseline? If data confirmed: 3. Is this an acute change? 4. Administer prn meds/treatments as ordered? 5. Note Sepsis score 6. Review goals of care 7. Sports coach and Provider 8. Call RRT nurse as needed. 9. Document patient condition/interventions/response. 10. Increase frequency of vital signs and focused assessments to at least q2h x2. - If stable, then q4h x2 and then q8h or dept. routine. - If unstable, contact Provider & RRT nurse. Prepare for possible transfer. 11. Add entry in progress notes using the smart phrase ".MEWS".  Green - Likely stable [place] - Comfort Care Only  1. Continue routine/ordered monitoring.  2. Review goals of care. 1. Continue routine/ordered monitoring. 2. Review goals of care.   No acute changes, no distress noted. VS frequency increased per protocol.

## 2018-09-12 NOTE — Progress Notes (Signed)
[facility]                                                                              Patient Demographics  Suzanne [name], is a 63 y.o. female, DOB - 10-May-1956, GDJ:[id number]  Admit date - 09/09/2018   Admitting Physician Sid Falcon, MD  Outpatient Primary MD for the patient is Unk Pinto, MD  Outpatient specialists:   LOS - 3  days   Medical records reviewed and are as summarized below:    No chief complaint on file.      Brief summary   Suzanne [name] is a 63 y.o. female with medical history significant of EtOH abuse, metastatic breast cancer, depression, COPD, CAD, history of stent placement, hyperlipidemia, hypertension, insomnia  who was sent from cancer center due to progressively worse back pain since last week after the patient had a fall while trying going to the bathroom in her house at night with very daily illumination.  She denied head trauma or LOC.    CT abdomen pelvis showed worsening bony metastatic disease specially in the pelvis and lumbar spine, new compression fractures T9, T11, L4.  Probable pathological fracture in the left ischium, nondisplaced, worsening lytic metastatic lesions in the right superior pubic rami and anterior wall of right establishment as well as the left femoral head.  Assessment & Plan    Principal Problem: Intractable pain secondary to worsening bony metastatic disease in the thoracic, lumbar spine, pelvis  - CT abdomen pelvis showed worsening bony metastatic disease specially in the pelvis and lumbar spine, new compression fractures T9, T11, L4.  Probable pathological fracture in the left ischium, nondisplaced, worsening lytic metastatic lesions in the right superior pubic rami and anterior wall of right establishment as well as the left femoral head. -Patient placed on Dilaudid PCA, fentanyl patch, started on Decadron, highly appreciate palliative medicine for assisting with pain management.   -Patient has  declined radiation to the bony metastasis -Plan on focus on the comfort care, pain management -Added mag citrate -Reviewed palliative medicine, Dr. Kirstie Mirza recommendations. Patient now agreeable with the plan for PCA at home.  PICC line ordered.  Active Problems: Metastatic right breast cancer -Per oncology, further chemotherapy would be futile  -Focus now on pain control and comfort    GERD (gastroesophageal reflux disease) -Continue PPI    COPD with asthma ([place]) -Better today, continue scheduled duo nebs and albuterol as needed, continue supplemental O2   Hypokalemia -Replaced  Hypercalcemia -Calcium improving, status post 1 dose of Aredia, now on Decadron, calcium improving  Code Status: DNR status DVT Prophylaxis:  Lovenox Family Communication: Discussed in detail with the patient, all imaging results, lab results explained to the patient    Disposition Plan:   Time Spent in minutes 25 minutes  Procedures:  CT abdomen and pelvis  Consultants:   Oncology Palliative medicine  Antimicrobials:   Anti-infectives (From admission, onward)   None         Medications  Scheduled Meds: . dexamethasone  4 mg Intravenous Q12H  . enoxaparin (LOVENOX) injection  40 mg Subcutaneous Q24H  . fentaNYL  1 patch  Transdermal Q72H  . HYDROmorphone   Intravenous Q4H  . ipratropium-albuterol  3 mL Nebulization TID  . loratadine  10 mg Oral Daily  . LORazepam  1-2 mg Oral QHS  . magnesium citrate  1 Bottle Oral Once  . pantoprazole  40 mg Oral Daily  . senna  2 tablet Oral QHS   Continuous Infusions: . sodium chloride 10 mL/hr at 09/12/18 1100   PRN Meds:.sodium chloride, acetaminophen **OR** acetaminophen, albuterol, LORazepam, magnesium citrate, ondansetron (ZOFRAN) IV, prochlorperazine **OR** prochlorperazine **OR** prochlorperazine, promethazine      Subjective:   Jaanvi Fizer was seen and examined today.  Constipated, appears somewhat overwhelmed.  States pain  is controlled 7/10, on Dilaudid PCA.   Patient denies dizziness, chest pain, shortness of breath, abdominal pain.    Objective:   Vitals:   09/12/18 0532 09/12/18 0800 09/12/18 0843 09/12/18 1258  BP: (!) 142/99     Pulse: (!) 107     Resp: 20 18  17   Temp: 98.6 F (37 C)     TempSrc: Oral     SpO2: 97% 92% 90% 91%  Weight:      Height:        Intake/Output Summary (Last 24 hours) at 09/12/2018 1311 Last data filed at 09/12/2018 1100 Gross per 24 hour  Intake 3949.17 ml  Output -  Net 3949.17 ml     Wt Readings from Last 3 Encounters:  09/09/18 73.6 kg  09/09/18 73.6 kg  09/05/18 74.1 kg   Physical Exam  General: Alert and oriented x 3, NAD, ill-appearing  Eyes:   HEENT:    Cardiovascular: S1 S2 clear, RRR. No pedal edema b/l  Respiratory: Mild scattered wheezing  Gastrointestinal: Soft, NT, ND, NBS  Ext: no pedal edema bilaterally  Neuro: no new deficits  Musculoskeletal: No cyanosis, clubbing  Skin: No rashes  Psych: Normal affect and demeanor, alert and oriented x3   Data Reviewed:  I have personally reviewed following labs and imaging studies  Micro Results No results found for this or any previous visit (from the past 240 hour(s)).  Radiology Reports Dg Chest 2 View  Result Date: 09/05/2018 CLINICAL DATA:  History of breast cancer with wheezing and shortness of breath. EXAM: CHEST - 2 VIEW COMPARISON:  June 25, 2016 FINDINGS: The heart size and mediastinal contours are stable. Moderate to large right pleural effusion with consolidation of right mid to lung base are noted. The left lung is clear. The visualized skeletal structures are stable. IMPRESSION: Moderate to large right pleural effusion with consolidation of right mid to lung base are noted. Underlying pneumonia is not excluded. Electronically Signed   By: Abelardo Diesel M.D.   On: 09/05/2018 13:34   Ct Chest W Contrast  Result Date: 09/09/2018 CLINICAL DATA:  Metastatic breast cancer  currently on Xeloda palliative chemotherapy. Worsening dyspnea. Bone pain. EXAM: CT CHEST, ABDOMEN, AND PELVIS WITH CONTRAST TECHNIQUE: Multidetector CT imaging of the chest, abdomen and pelvis was performed following the standard protocol during bolus administration of intravenous contrast. CONTRAST:  160mL OMNIPAQUE IOHEXOL 300 MG/ML  SOLN COMPARISON:  PET-CT 07/28/2018 FINDINGS: CT CHEST FINDINGS Cardiovascular: Unremarkable Mediastinum/Nodes: Left infrahilar node or nodule measuring 1.9 by 1.9 cm on image 25/2 is observed. There were previously smaller hypermetabolic lymph nodes in this vicinity on 07/28/2018. Lungs/Pleura: Moderate right pleural effusion without a well-defined pleural mass, but potentially with some mild loculation. Considerable atelectatic portions of the right middle lobe and right lower lobe which previously had hypermetabolic  elements compatible with underlying metastatic lesions. A left lower lobe nodule was previously hypermetabolic and currently measures 1.2 cm in short axis thickness on image 83/6, formerly the same on prior PET-CT where the lesion was hypermetabolic. Trace left pleural effusion. Musculoskeletal: Widespread osseous metastatic disease is primarily sclerotic. Compared to 03/31/2018, there is a new 40% compression fracture at T9 and a new 15% superior endplate compression fracture at T11. Old healed sternal fracture. Primarily similar distribution of osseous metastatic disease compared to previous. Reconstruction related findings in the right breast, including a fluid collection along the right pectoralis muscle. CT ABDOMEN PELVIS FINDINGS Hepatobiliary: Scattered hepatic metastatic lesions are similar in distribution to the 07/28/2018 exam, but increased from the 03/31/2018 diagnostic CT examination. Cholelithiasis present potentially with mild gallbladder wall thickening. Extrahepatic biliary dilatation with CBD at 1.3 cm. This is significantly increased from 07/28/2018,  raise the possibility of choledocholithiasis. No overt intrahepatic biliary dilatation. An index segment 4 enhancing metastatic lesion measures 1.5 by 1.5 cm on image 34/2. Pancreas: Unremarkable Spleen: Unremarkable Adrenals/Urinary Tract: Unremarkable Stomach/Bowel: Unremarkable Vascular/Lymphatic: Mild aortoiliac atherosclerotic vascular calcification. Reproductive: Uterus absent.  Adnexa unremarkable. Other: No supplemental non-categorized findings. Musculoskeletal: Prior left tram flap procedure. Worsening metastatic disease in the left ischium with periostitis and suspicion for a small nondisplaced pathologic fracture of the left ischium on image 116/2. Worsening lytic lesions of the right superior pubic ramus and anterior wall of the right acetabulum. New 1.7 cm lytic lesion of the left femoral head. Widespread and increased sclerotic osseous metastatic disease in the bony pelvis. New periosteal reaction along the posterior left iliac bone likely related to a lytic mass on image 85/2. Metastatic burden in the lumbar spine has likewise mildly increased. New superior endplate compression fracture and inferior endplate compression fracture at the L4 level. IMPRESSION: 1. Worsening bony metastatic disease especially perceptible in the pelvis and lumbar spine. New compression fractures at T9, T11, and L4. 2. Probable pathologic fracture in the left ischium, nondisplaced. Worsening lytic metastatic lesions of the right superior pubic ramus and anterior wall of the right acetabulum as well as the left femoral head. 3. Enlarging left infrahilar mass/conglomerate adenopathy, 1.9 cm in diameter. Stable previously hypermetabolic left lower lobe pulmonary nodule. 4. Stable distribution of scattered hepatic metastatic lesions compared to the prior MRI. 5. Considerable atelectatic portions the right middle lobe and right lower lobe due to the right pleural effusion. These atelectatic regions were shown to harbor metastatic  lesions on the prior PET-CT. 6. New extrahepatic biliary dilatation up to 1.3 cm. There is cholelithiasis and strictly speaking I can not exclude choledocholithiasis as a potential cause. Hepatobiliary sonography may be warranted. 7. Other imaging findings of potential clinical significance: Moderate right and trace left pleural effusion. Aortic Atherosclerosis (ICD10-I70.0). Electronically Signed   By: Van Clines M.D.   On: 09/09/2018 14:14   Ct Abdomen Pelvis W Contrast  Result Date: 09/09/2018 CLINICAL DATA:  Metastatic breast cancer currently on Xeloda palliative chemotherapy. Worsening dyspnea. Bone pain. EXAM: CT CHEST, ABDOMEN, AND PELVIS WITH CONTRAST TECHNIQUE: Multidetector CT imaging of the chest, abdomen and pelvis was performed following the standard protocol during bolus administration of intravenous contrast. CONTRAST:  149mL OMNIPAQUE IOHEXOL 300 MG/ML  SOLN COMPARISON:  PET-CT 07/28/2018 FINDINGS: CT CHEST FINDINGS Cardiovascular: Unremarkable Mediastinum/Nodes: Left infrahilar node or nodule measuring 1.9 by 1.9 cm on image 25/2 is observed. There were previously smaller hypermetabolic lymph nodes in this vicinity on 07/28/2018. Lungs/Pleura: Moderate right pleural effusion without a well-defined pleural  mass, but potentially with some mild loculation. Considerable atelectatic portions of the right middle lobe and right lower lobe which previously had hypermetabolic elements compatible with underlying metastatic lesions. A left lower lobe nodule was previously hypermetabolic and currently measures 1.2 cm in short axis thickness on image 83/6, formerly the same on prior PET-CT where the lesion was hypermetabolic. Trace left pleural effusion. Musculoskeletal: Widespread osseous metastatic disease is primarily sclerotic. Compared to 03/31/2018, there is a new 40% compression fracture at T9 and a new 15% superior endplate compression fracture at T11. Old healed sternal fracture. Primarily  similar distribution of osseous metastatic disease compared to previous. Reconstruction related findings in the right breast, including a fluid collection along the right pectoralis muscle. CT ABDOMEN PELVIS FINDINGS Hepatobiliary: Scattered hepatic metastatic lesions are similar in distribution to the 07/28/2018 exam, but increased from the 03/31/2018 diagnostic CT examination. Cholelithiasis present potentially with mild gallbladder wall thickening. Extrahepatic biliary dilatation with CBD at 1.3 cm. This is significantly increased from 07/28/2018, raise the possibility of choledocholithiasis. No overt intrahepatic biliary dilatation. An index segment 4 enhancing metastatic lesion measures 1.5 by 1.5 cm on image 34/2. Pancreas: Unremarkable Spleen: Unremarkable Adrenals/Urinary Tract: Unremarkable Stomach/Bowel: Unremarkable Vascular/Lymphatic: Mild aortoiliac atherosclerotic vascular calcification. Reproductive: Uterus absent.  Adnexa unremarkable. Other: No supplemental non-categorized findings. Musculoskeletal: Prior left tram flap procedure. Worsening metastatic disease in the left ischium with periostitis and suspicion for a small nondisplaced pathologic fracture of the left ischium on image 116/2. Worsening lytic lesions of the right superior pubic ramus and anterior wall of the right acetabulum. New 1.7 cm lytic lesion of the left femoral head. Widespread and increased sclerotic osseous metastatic disease in the bony pelvis. New periosteal reaction along the posterior left iliac bone likely related to a lytic mass on image 85/2. Metastatic burden in the lumbar spine has likewise mildly increased. New superior endplate compression fracture and inferior endplate compression fracture at the L4 level. IMPRESSION: 1. Worsening bony metastatic disease especially perceptible in the pelvis and lumbar spine. New compression fractures at T9, T11, and L4. 2. Probable pathologic fracture in the left ischium,  nondisplaced. Worsening lytic metastatic lesions of the right superior pubic ramus and anterior wall of the right acetabulum as well as the left femoral head. 3. Enlarging left infrahilar mass/conglomerate adenopathy, 1.9 cm in diameter. Stable previously hypermetabolic left lower lobe pulmonary nodule. 4. Stable distribution of scattered hepatic metastatic lesions compared to the prior MRI. 5. Considerable atelectatic portions the right middle lobe and right lower lobe due to the right pleural effusion. These atelectatic regions were shown to harbor metastatic lesions on the prior PET-CT. 6. New extrahepatic biliary dilatation up to 1.3 cm. There is cholelithiasis and strictly speaking I can not exclude choledocholithiasis as a potential cause. Hepatobiliary sonography may be warranted. 7. Other imaging findings of potential clinical significance: Moderate right and trace left pleural effusion. Aortic Atherosclerosis (ICD10-I70.0). Electronically Signed   By: Van Clines M.D.   On: 09/09/2018 14:14   [place] Ekg Site Rite  Result Date: 09/12/2018 If Site Rite image not attached, placement could not be confirmed due to current cardiac rhythm.   Lab Data:  CBC: Recent Labs  Lab 09/09/18 1059 09/11/18 0341 09/12/18 0309  WBC 6.5 6.6 7.8  NEUTROABS 4.5  --   --   HGB 12.2 13.9 12.5  HCT 36.3 41.2 38.1  MCV 112.0* 112.6* 111.1*  PLT 220 242 423   Basic Metabolic Panel: Recent Labs  Lab 09/09/18 1059 09/11/18 0341  09/12/18 1018  NA 139 136 135  K 3.0* 3.2* 3.4*  CL 88* 93* 95*  CO2 36* 28 27  GLUCOSE 142* 165* 194*  BUN 9 5* 10  CREATININE 0.76 0.44 0.47  CALCIUM 11.3* 10.2 9.3   GFR: Estimated Creatinine Clearance: 70.2 mL/min (by C-G formula based on SCr of 0.47 mg/dL). Liver Function Tests: Recent Labs  Lab 09/09/18 1059 09/11/18 0341  AST 76* 81*  ALT 29 33  ALKPHOS 171* 149*  BILITOT 1.1 1.3*  PROT 7.5 7.8  ALBUMIN 3.0* 3.4*   No results for input(s): LIPASE,  AMYLASE in the last 168 hours. No results for input(s): AMMONIA in the last 168 hours. Coagulation Profile: No results for input(s): INR, PROTIME in the last 168 hours. Cardiac Enzymes: No results for input(s): CKTOTAL, CKMB, CKMBINDEX, TROPONINI in the last 168 hours. BNP (last 3 results) No results for input(s): PROBNP in the last 8760 hours. HbA1C: No results for input(s): HGBA1C in the last 72 hours. CBG: No results for input(s): GLUCAP in the last 168 hours. Lipid Profile: No results for input(s): CHOL, HDL, LDLCALC, TRIG, CHOLHDL, LDLDIRECT in the last 72 hours. Thyroid Function Tests: No results for input(s): TSH, T4TOTAL, FREET4, T3FREE, THYROIDAB in the last 72 hours. Anemia Panel: No results for input(s): VITAMINB12, FOLATE, FERRITIN, TIBC, IRON, RETICCTPCT in the last 72 hours. Urine analysis:    Component Value Date/Time   COLORURINE DARK YELLOW 02/10/2018 1537   APPEARANCEUR TURBID (A) 02/10/2018 1537   APPEARANCEUR Clear 12/07/2013 1843   LABSPEC 1.024 02/10/2018 1537   LABSPEC 1.003 12/07/2013 1843   LABSPEC 1.020 06/27/2006 1640   PHURINE 5.5 02/10/2018 1537   GLUCOSEU 2+ (A) 02/10/2018 1537   GLUCOSEU Negative 12/07/2013 1843   HGBUR NEGATIVE 02/10/2018 1537   BILIRUBINUR NEGATIVE 08/29/2015 1517   BILIRUBINUR Negative 12/07/2013 1843   BILIRUBINUR Negative 06/27/2006 1640   KETONESUR 1+ (A) 02/10/2018 1537   PROTEINUR TRACE (A) 02/10/2018 1537   UROBILINOGEN 0.2 08/26/2014 1759   NITRITE NEGATIVE 08/29/2015 1517   LEUKOCYTESUR NEGATIVE 08/29/2015 1517   LEUKOCYTESUR Negative 12/07/2013 1843   LEUKOCYTESUR Negative 06/27/2006 1640     Saraiyah Hemminger M.D. [facility] 09/12/2018, 1:11 PM  Pager: [phone] Between 7am to 7pm - call Pager - [phone]  After 7pm go to [email or web address] - password [email or web address]  Call night coverage person covering after 7pm

## 2018-09-12 NOTE — Consult Note (Signed)
[place]: Met with pt and her daughter as well as her husband.  I will  Order from [facility] a PCA pump to be delivered to the pt's home.  I am also ordering oxygen 10 liter concentrator, nebulizer, hospital bed, OBT, BSC and wheelchair to be delivered tomorrow morning Saturday morning. Pt's husband will take pt home by car and the nurses will meet them at home when she is stable and ready for discharge. (pending equipment in place). They arei agreement to hospice services. Webb Silversmith RN   Yetta Glassman is on call this weekend and can be reach ed at [place]

## 2018-09-13 DIAGNOSIS — R112 Nausea with vomiting, unspecified: Secondary | ICD-10-CM

## 2018-09-13 DIAGNOSIS — T451X5A Adverse effect of antineoplastic and immunosuppressive drugs, initial encounter: Secondary | ICD-10-CM

## 2018-09-13 LAB — CBC
HCT: 38.3 % (ref 36.0–46.0)
Hemoglobin: 12.8 g/dL (ref 12.0–15.0)
MCH: 38 pg — ABNORMAL HIGH (ref 26.0–34.0)
MCHC: 33.4 g/dL (ref 30.0–36.0)
MCV: 113.6 fL — ABNORMAL HIGH (ref 80.0–100.0)
Platelets: 212 10*3/uL (ref 150–400)
RBC: 3.37 MIL/uL — ABNORMAL LOW (ref 3.87–5.11)
RDW: 15.4 % (ref 11.5–15.5)
WBC: 9.9 10*3/uL (ref 4.0–10.5)
nRBC: 0 % (ref 0.0–0.2)

## 2018-09-13 MED ORDER — HYDROMORPHONE 1 MG/ML IV SOLN: 1.0000 mg | INTRAVENOUS | Status: AC

## 2018-09-13 MED ORDER — LORAZEPAM 2 MG PO TABS: 1.0000 mg | ORAL_TABLET | Freq: Four times a day (QID) | ORAL | 0 refills | Status: AC | PRN

## 2018-09-13 MED ORDER — ONDANSETRON HCL 8 MG PO TABS: 8.0000 mg | ORAL_TABLET | Freq: Three times a day (TID) | ORAL | 3 refills | Status: AC | PRN

## 2018-09-13 MED ORDER — ACETAMINOPHEN 325 MG PO TABS: 650.0000 mg | ORAL_TABLET | Freq: Four times a day (QID) | ORAL | Status: AC | PRN

## 2018-09-13 MED ORDER — FENTANYL 50 MCG/HR TD PT72: 1.0000 | MEDICATED_PATCH | TRANSDERMAL | 0 refills | Status: AC

## 2018-09-13 MED ORDER — SENNA 8.6 MG PO TABS: 2.0000 | ORAL_TABLET | Freq: Every day | ORAL | 0 refills | Status: AC

## 2018-09-13 MED ORDER — DEXAMETHASONE 4 MG PO TABS: 4.0000 mg | ORAL_TABLET | Freq: Two times a day (BID) | ORAL | 0 refills | Status: AC

## 2018-09-13 MED ORDER — PANTOPRAZOLE SODIUM 40 MG PO TBEC: 40.0000 mg | DELAYED_RELEASE_TABLET | Freq: Every day | ORAL | 3 refills | Status: AC

## 2018-09-13 MED ORDER — PROCHLORPERAZINE MALEATE 5 MG PO TABS: 5.0000 mg | ORAL_TABLET | Freq: Four times a day (QID) | ORAL | 0 refills | Status: AC | PRN

## 2018-09-13 NOTE — Progress Notes (Signed)
Daily Progress Note   Patient Name: Suzanne [name]       Date: 09/13/2018 DOB: March 11, 1956  Age: 63 y.o. MRN#: [id number] Attending Physician: Mendel Corning, MD Primary Care Physician: Unk Pinto, MD Admit Date: 09/09/2018  Reason for Consultation/Follow-up: Establishing goals of care and Pain control  Subjective: I met today with Suzanne [name].  Continues to report pain is better controlled since starting PCA.  Discussed that her goal it to be at home and as comfortable as possible.    I reviewed her PCA usage and she has used 41m in the last 24 hours.  Reviewed her pain and it seems as though a lot of this is incident pain related to any sort of movement.    She reports being agreeable today to working to transition home with hospice with PCA.  Discussed need for PICC placement and she is agreeable.  Length of Stay: 4  Current Medications: Scheduled Meds:  . dexamethasone  4 mg Intravenous Q12H  . enoxaparin (LOVENOX) injection  40 mg Subcutaneous Q24H  . fentaNYL  1 patch Transdermal Q72H  . HYDROmorphone   Intravenous Q4H  . ipratropium-albuterol  3 mL Nebulization Q6H WA  . loratadine  10 mg Oral Daily  . LORazepam  1-2 mg Oral QHS  . pantoprazole  40 mg Oral Daily  . senna  2 tablet Oral QHS  . sodium chloride flush  10-40 mL Intracatheter Q12H    Continuous Infusions: . sodium chloride 10 mL/hr at 09/13/18 0603    PRN Meds: sodium chloride, acetaminophen **OR** acetaminophen, albuterol, LORazepam, magnesium citrate, ondansetron (ZOFRAN) IV, prochlorperazine **OR** prochlorperazine **OR** prochlorperazine, promethazine, sodium chloride flush  Physical Exam    General: Sleepy, in no acute distress.  HEENT: No bruits, no goiter, no JVD Heart: Tachycardic. No murmur  appreciated. Lungs: Good air movement, clear Abdomen: Soft, nontender, nondistended, positive bowel sounds.  Ext: No significant edema Skin: Warm and dry Neuro: Grossly intact, nonfocal.       Vital Signs: BP (!) 145/97 (BP Location: Left Arm)   Pulse (!) 107   Temp 98.2 F (36.8 C) (Oral)   Resp 20   Ht 5' 2.5" (1.588 m)   Wt 73.6 kg   SpO2 93%   BMI 29.19 kg/m  SpO2: SpO2: 93 % O2 Device: O2 Device:  Nasal Cannula O2 Flow Rate: O2 Flow Rate (L/min): 4 L/min  Intake/output summary:   Intake/Output Summary (Last 24 hours) at 09/13/2018 1740 Last data filed at 09/12/2018 1223 Gross per 24 hour  Intake 1097.08 ml  Output -  Net 1097.08 ml   LBM: Last BM Date: (PTA) Baseline Weight: Weight: 73.6 kg Most recent weight: Weight: 73.6 kg       Palliative Assessment/Data:    Flowsheet Rows     Most Recent Value  Intake Tab  Referral Department  Hospitalist  Unit at Time of Referral  Oncology Unit  Palliative Care Primary Diagnosis  Cancer  Date Notified  09/10/18  Palliative Care Type  New Palliative care  Reason for referral  Non-pain Symptom, Pain  Date of Admission  09/09/18  # of days IP prior to Palliative referral  1  Clinical Assessment  Psychosocial & Spiritual Assessment  Palliative Care Outcomes      Patient Active Problem List   Diagnosis Date Noted  . Cancer associated pain   . Compression fracture of thoracic vertebra ([place]) 09/09/2018  . Hypokalemia 09/09/2018  . Hypercalcemia 09/09/2018  . Metastatic breast cancer ([place]) 06/30/2018  . Insulin resistance 06/30/2018  . Vitamin D deficiency 06/30/2018  . Hyperlipidemia, mixed 06/30/2018  . Elevated BP without diagnosis of hypertension 06/30/2018  . Elevated serum glucose 02/09/2018  . Encounter for general adult medical examination with abnormal findings 12/15/2017  . Chronic hypoxemic respiratory failure ([place]) 05/29/2017  . Atelectasis of right lung 05/29/2017  . Genetic testing 05/31/2016  .  Bone metastases ([place]) 05/07/2016  . Lung metastases ([place]) 05/07/2016  . 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 Prediabetes 08/26/2014  . Systolic dysfunction 81/44/8185  . GERD (gastroesophageal reflux disease) 08/26/2014  . Overweight (BMI 25.0-29.9) 08/26/2014  . Lung nodules 08/26/2014  . Insomnia 08/26/2014  . RLS (restless legs syndrome) 08/26/2014  . Breast cancer of lower-outer quadrant of right female breast ([place]) 07/15/2012  . Essential hypertension   . Benzodiazepine dependence ([place]) 05/24/2012    Class: Chronic  . PVC (premature ventricular contraction) 11/29/2009  . Chronic cough 07/05/2008  . DYSPNEA 08/15/2007    Palliative Care Assessment & Plan   Patient Profile: 63 y.o. female  with past medical history of breast cancer mets to bone, lung, liver; ETOH abuse, depression, COPD, CAD, HTN, insomnia admitted on 09/09/2018 with intractable pain s/t bone mets.   Recommendations/Plan: Pain: Continue PCA.  With goal of being home with hospice with good pain control and largely incident pain, my recommendation is to pursue hospice with PCA at home.  This requires placement of PICC prior to discharge.  She is agreeable to this today.  Goals of Care and Additional Recommendations:  Limitations on Scope of Treatment: Avoid Hospitalization  Code Status:    Code Status Orders  (From admission, onward)         Start     Ordered   09/09/18 1833  Do not attempt resuscitation (DNR)  Continuous    Question Answer Comment  In the event of cardiac or respiratory ARREST Do not call a "code blue"   In the event of cardiac or respiratory ARREST Do not perform Intubation, CPR, defibrillation or ACLS   In the event of cardiac or respiratory ARREST Use medication by any route, position, wound care, and other measures to relive pain and suffering. May use oxygen, suction and manual treatment of airway obstruction as needed for comfort.      09/09/18 1836  Code Status History     Date Active Date Inactive Code Status Order ID Comments User Context   09/09/2018 1829 09/09/2018 1836 DNR [id number]  Reubin Milan, MD Inpatient   05/21/2012 1949 05/22/2012 1904 Full Code [id number]  Kathalene Frames, MD ED    Advance Directive Documentation     Most Recent Value  Type of Advance Directive  Healthcare Power of Attorney [pt did not]  Pre-existing out of facility DNR order (yellow form or pink MOST form)  -  "MOST" Form in Place?  -       Prognosis:   < 3 months  Discharge Planning:  Home with Hospice  Care plan was discussed with patient, RN  Thank you for allowing the Palliative Medicine Team to assist in the care of this patient.   Total Time 30 Prolonged Time Billed No      Greater than 50%  of this time was spent counseling and coordinating care related to the above assessment and plan.  Micheline Rough, MD  Please contact Palliative Medicine Team phone at [phone] for questions and concerns.

## 2018-09-13 NOTE — Discharge Summary (Signed)
Physician Discharge Summary   Patient ID: Suzanne [name] MRN: [id number] DOB/AGE: 62-Aug-1957 62 y.o.  Admit date: 09/09/2018 Discharge date: 09/13/2018  Primary Care Physician:  Unk Pinto, MD   Recommendations for Outpatient Follow-up:  1. Patient placed on Dilaudid PCA, PICC line has been placed, for home hospice 2. DNR/DNI comfort care  Home Health: None  Equipment/Devices:   Discharge Condition: Guarded, overall poor prognosis CODE STATUS: DNR Diet recommendation: Regular diet, comfort care   Discharge Diagnoses:   Intractable pain secondary to worsening bony metastatic disease in the thoracic, lumbar spine and pelvis . Compression fracture of thoracic vertebra (HCC) . Bone metastases ([place]) . COPD with asthma ([place]) . GERD (gastroesophageal reflux disease) . Metastatic right breast cancer ([place]) . Hypokalemia . Hypercalcemia    Consults: Palliative medicine Oncology    Allergies:   Allergies  Allergen Reactions  . Ace Inhibitors     cough     DISCHARGE MEDICATIONS: Allergies as of 09/13/2018      Reactions   Ace Inhibitors    cough      Medication List    STOP taking these medications   bisoprolol 5 MG tablet Commonly known as:  ZEBETA   fluticasone 50 MCG/ACT nasal spray Commonly known as:  FLONASE   furosemide 40 MG tablet Commonly known as:  LASIX   HYDROmorphone 4 MG tablet Commonly known as:  DILAUDID     TAKE these medications   acetaminophen 325 MG tablet Commonly known as:  TYLENOL Take 2 tablets (650 mg total) by mouth every 6 (six) hours as needed for mild pain (over the counter).   albuterol (2.5 MG/3ML) 0.083% nebulizer solution Commonly known as:  PROVENTIL INHALE 1 VIAL VIA NEBULIZER EVERY 6 HOURS AS NEEDED FOR WHEEZING OR SHORTNESS OF BREATH   albuterol 108 (90 Base) MCG/ACT inhaler Commonly known as:  PROAIR HFA INHALE 1 PUFF BY MOUTH EVERY 6 HOURS AS NEEDED FOR WHEEZING OR SHORTNESS OF BREATH   blood glucose meter  kit and supplies Kit Dispense based on patient and insurance preference. Use up to four times daily as directed. (FOR ICD-9 250.00, 250.01).   calcium carbonate 1250 (500 Ca) MG tablet Commonly known as:  OS-CAL - dosed in mg of elemental calcium Take 1 tablet by mouth.   cholecalciferol 1000 units tablet Commonly known as:  VITAMIN D Take 1,000 Units by mouth daily.   dexamethasone 4 MG tablet Commonly known as:  DECADRON Take 1 tablet (4 mg total) by mouth 2 (two) times daily with a meal.   fentaNYL 50 MCG/HR Commonly known as:  [place] 1 patch onto the skin every 3 (three) days. What changed:  how much to take   HYDROmorphone 1 mg/mL injection Commonly known as:  DILAUDID Inject 1 mL (1 mg total) into the vein every 4 (four) hours.   ibuprofen 200 MG tablet Commonly known as:  ADVIL,MOTRIN Take 200 mg by mouth every 6 (six) hours as needed.   loratadine 10 MG tablet Commonly known as:  CLARITIN Take 1 tablet (10 mg total) by mouth daily.   LORazepam 2 MG tablet Commonly known as:  ATIVAN Take 0.5-1 tablets (1-2 mg total) by mouth every 6 (six) hours as needed for anxiety. What changed:    when to take this  reasons to take this   ondansetron 8 MG tablet Commonly known as:  ZOFRAN Take 1 tablet (8 mg total) by mouth every 8 (eight) hours as needed for nausea or vomiting. What  changed:    how much to take  how to take this  when to take this  reasons to take this  additional instructions   pantoprazole 40 MG tablet Commonly known as:  PROTONIX Take 1 tablet (40 mg total) by mouth daily. Start taking on:  September 14, 2018   potassium chloride SA 20 MEQ tablet Commonly known as:  K-DUR,KLOR-CON Take 1 tablet twice daily.   prochlorperazine 5 MG tablet Commonly known as:  COMPAZINE Take 1 tablet (5 mg total) by mouth every 6 (six) hours as needed for nausea or refractory nausea / vomiting. What changed:    medication strength  how much to  take  reasons to take this   promethazine 25 MG tablet Commonly known as:  PHENERGAN TAKE 1 TABLET BY MOUTH EVERY 6 HOURS AS NEEDED FOR NAUSEA.   senna 8.6 MG Tabs tablet Commonly known as:  SENOKOT Take 2 tablets (17.2 mg total) by mouth at bedtime.   TRELEGY ELLIPTA 100-62.5-25 MCG/INH Aepb Generic drug:  Fluticasone-Umeclidin-Vilant INHALE 1 PUFF INTO THE LUNGS DAILY.        Brief H and P: For complete details please refer to admission H and P, but in brief Suzanne [name] a 63 y.o.femalewith medical history significant ofEtOH abuse, metastatic breast cancer, depression, COPD, CAD, history of stent placement, hyperlipidemia, hypertension, insomnia who was sent from cancer center due to progressively worse back pain since last week after the patient had a fall while trying going to the bathroom in her house at night with very daily illumination. She denied head trauma or LOC.    CT abdomen pelvis showed worsening bony metastatic disease specially in the pelvis and lumbar spine, new compression fractures T9, T11, L4.  Probable pathological fracture in the left ischium, nondisplaced, worsening lytic metastatic lesions in the right superior pubic rami and anterior wall of right establishment as well as the left femoral head.  Hospital Course:   Intractable pain secondary to worsening bony metastatic disease in the thoracic, lumbar spine, pelvis  - CT abdomen pelvis showed worsening bony metastatic disease specially in the pelvis and lumbar spine, new compression fractures T9, T11, L4.  Probable pathological fracture in the left ischium, nondisplaced, worsening lytic metastatic lesions in the right superior pubic rami and anterior wall of right establishment as well as the left femoral head. -Patient placed on Dilaudid PCA, fentanyl patch, started on Decadron, highly appreciate palliative medicine for assisting with pain management.   -Patient has declined radiation to the bony  metastasis -Plan on focus on the comfort care, pain management -Constipation resolved with mag citrate -Reviewed palliative medicine, Dr. Kirstie Mirza recommendations. Patient now agreeable with the plan for PCA at home.  PICC line ordered.  Metastatic right breast cancer -Per oncology, further chemotherapy would be futile  -Focus now on pain control and comfort    GERD (gastroesophageal reflux disease) -Continue PPI    COPD with asthma ([place]) -Better today, continue scheduled duo nebs and albuterol as needed, continue supplemental O2   Hypokalemia -Replaced  Hypercalcemia -Calcium improving, status post 1 dose of Aredia, now on Decadron, calcium improving  Day of Discharge S: Some shortness of breath with wheezing, pain controlled with PCA  BP (!) 147/120 (BP Location: Right Arm)   Pulse (!) 57   Temp 98 F (36.7 C) (Oral)   Resp 18   Ht 5' 2.5" (1.588 m)   Wt 73.6 kg   SpO2 94%   BMI 29.19 kg/m   Physical  Exam: General: Alert and awake oriented x3 not in any acute distress. HEENT: anicteric sclera, pupils reactive to light and accommodation CVS: S1-S2 clear no murmur rubs or gallops Chest: Mild scattered wheezing bilaterally Abdomen: soft nontender, nondistended, normal bowel sounds Extremities: no cyanosis, clubbing or edema noted bilaterally Neuro: Cranial nerves II-XII intact, no focal neurological deficits   The results of significant diagnostics from this hospitalization (including imaging, microbiology, ancillary and laboratory) are listed below for reference.      Procedures/Studies:  Dg Chest 2 View  Result Date: 09/05/2018 CLINICAL DATA:  History of breast cancer with wheezing and shortness of breath. EXAM: CHEST - 2 VIEW COMPARISON:  June 25, 2016 FINDINGS: The heart size and mediastinal contours are stable. Moderate to large right pleural effusion with consolidation of right mid to lung base are noted. The left lung is clear. The visualized  skeletal structures are stable. IMPRESSION: Moderate to large right pleural effusion with consolidation of right mid to lung base are noted. Underlying pneumonia is not excluded. Electronically Signed   By: Abelardo Diesel M.D.   On: 09/05/2018 13:34   Ct Chest W Contrast  Result Date: 09/09/2018 CLINICAL DATA:  Metastatic breast cancer currently on Xeloda palliative chemotherapy. Worsening dyspnea. Bone pain. EXAM: CT CHEST, ABDOMEN, AND PELVIS WITH CONTRAST TECHNIQUE: Multidetector CT imaging of the chest, abdomen and pelvis was performed following the standard protocol during bolus administration of intravenous contrast. CONTRAST:  158m OMNIPAQUE IOHEXOL 300 MG/ML  SOLN COMPARISON:  PET-CT 07/28/2018 FINDINGS: CT CHEST FINDINGS Cardiovascular: Unremarkable Mediastinum/Nodes: Left infrahilar node or nodule measuring 1.9 by 1.9 cm on image 25/2 is observed. There were previously smaller hypermetabolic lymph nodes in this vicinity on 07/28/2018. Lungs/Pleura: Moderate right pleural effusion without a well-defined pleural mass, but potentially with some mild loculation. Considerable atelectatic portions of the right middle lobe and right lower lobe which previously had hypermetabolic elements compatible with underlying metastatic lesions. A left lower lobe nodule was previously hypermetabolic and currently measures 1.2 cm in short axis thickness on image 83/6, formerly the same on prior PET-CT where the lesion was hypermetabolic. Trace left pleural effusion. Musculoskeletal: Widespread osseous metastatic disease is primarily sclerotic. Compared to 03/31/2018, there is a new 40% compression fracture at T9 and a new 15% superior endplate compression fracture at T11. Old healed sternal fracture. Primarily similar distribution of osseous metastatic disease compared to previous. Reconstruction related findings in the right breast, including a fluid collection along the right pectoralis muscle. CT ABDOMEN PELVIS  FINDINGS Hepatobiliary: Scattered hepatic metastatic lesions are similar in distribution to the 07/28/2018 exam, but increased from the 03/31/2018 diagnostic CT examination. Cholelithiasis present potentially with mild gallbladder wall thickening. Extrahepatic biliary dilatation with CBD at 1.3 cm. This is significantly increased from 07/28/2018, raise the possibility of choledocholithiasis. No overt intrahepatic biliary dilatation. An index segment 4 enhancing metastatic lesion measures 1.5 by 1.5 cm on image 34/2. Pancreas: Unremarkable Spleen: Unremarkable Adrenals/Urinary Tract: Unremarkable Stomach/Bowel: Unremarkable Vascular/Lymphatic: Mild aortoiliac atherosclerotic vascular calcification. Reproductive: Uterus absent.  Adnexa unremarkable. Other: No supplemental non-categorized findings. Musculoskeletal: Prior left tram flap procedure. Worsening metastatic disease in the left ischium with periostitis and suspicion for a small nondisplaced pathologic fracture of the left ischium on image 116/2. Worsening lytic lesions of the right superior pubic ramus and anterior wall of the right acetabulum. New 1.7 cm lytic lesion of the left femoral head. Widespread and increased sclerotic osseous metastatic disease in the bony pelvis. New periosteal reaction along the posterior left iliac bone  likely related to a lytic mass on image 85/2. Metastatic burden in the lumbar spine has likewise mildly increased. New superior endplate compression fracture and inferior endplate compression fracture at the L4 level. IMPRESSION: 1. Worsening bony metastatic disease especially perceptible in the pelvis and lumbar spine. New compression fractures at T9, T11, and L4. 2. Probable pathologic fracture in the left ischium, nondisplaced. Worsening lytic metastatic lesions of the right superior pubic ramus and anterior wall of the right acetabulum as well as the left femoral head. 3. Enlarging left infrahilar mass/conglomerate adenopathy,  1.9 cm in diameter. Stable previously hypermetabolic left lower lobe pulmonary nodule. 4. Stable distribution of scattered hepatic metastatic lesions compared to the prior MRI. 5. Considerable atelectatic portions the right middle lobe and right lower lobe due to the right pleural effusion. These atelectatic regions were shown to harbor metastatic lesions on the prior PET-CT. 6. New extrahepatic biliary dilatation up to 1.3 cm. There is cholelithiasis and strictly speaking I can not exclude choledocholithiasis as a potential cause. Hepatobiliary sonography may be warranted. 7. Other imaging findings of potential clinical significance: Moderate right and trace left pleural effusion. Aortic Atherosclerosis (ICD10-I70.0). Electronically Signed   By: Van Clines M.D.   On: 09/09/2018 14:14   Ct Abdomen Pelvis W Contrast  Result Date: 09/09/2018 CLINICAL DATA:  Metastatic breast cancer currently on Xeloda palliative chemotherapy. Worsening dyspnea. Bone pain. EXAM: CT CHEST, ABDOMEN, AND PELVIS WITH CONTRAST TECHNIQUE: Multidetector CT imaging of the chest, abdomen and pelvis was performed following the standard protocol during bolus administration of intravenous contrast. CONTRAST:  158m OMNIPAQUE IOHEXOL 300 MG/ML  SOLN COMPARISON:  PET-CT 07/28/2018 FINDINGS: CT CHEST FINDINGS Cardiovascular: Unremarkable Mediastinum/Nodes: Left infrahilar node or nodule measuring 1.9 by 1.9 cm on image 25/2 is observed. There were previously smaller hypermetabolic lymph nodes in this vicinity on 07/28/2018. Lungs/Pleura: Moderate right pleural effusion without a well-defined pleural mass, but potentially with some mild loculation. Considerable atelectatic portions of the right middle lobe and right lower lobe which previously had hypermetabolic elements compatible with underlying metastatic lesions. A left lower lobe nodule was previously hypermetabolic and currently measures 1.2 cm in short axis thickness on image 83/6,  formerly the same on prior PET-CT where the lesion was hypermetabolic. Trace left pleural effusion. Musculoskeletal: Widespread osseous metastatic disease is primarily sclerotic. Compared to 03/31/2018, there is a new 40% compression fracture at T9 and a new 15% superior endplate compression fracture at T11. Old healed sternal fracture. Primarily similar distribution of osseous metastatic disease compared to previous. Reconstruction related findings in the right breast, including a fluid collection along the right pectoralis muscle. CT ABDOMEN PELVIS FINDINGS Hepatobiliary: Scattered hepatic metastatic lesions are similar in distribution to the 07/28/2018 exam, but increased from the 03/31/2018 diagnostic CT examination. Cholelithiasis present potentially with mild gallbladder wall thickening. Extrahepatic biliary dilatation with CBD at 1.3 cm. This is significantly increased from 07/28/2018, raise the possibility of choledocholithiasis. No overt intrahepatic biliary dilatation. An index segment 4 enhancing metastatic lesion measures 1.5 by 1.5 cm on image 34/2. Pancreas: Unremarkable Spleen: Unremarkable Adrenals/Urinary Tract: Unremarkable Stomach/Bowel: Unremarkable Vascular/Lymphatic: Mild aortoiliac atherosclerotic vascular calcification. Reproductive: Uterus absent.  Adnexa unremarkable. Other: No supplemental non-categorized findings. Musculoskeletal: Prior left tram flap procedure. Worsening metastatic disease in the left ischium with periostitis and suspicion for a small nondisplaced pathologic fracture of the left ischium on image 116/2. Worsening lytic lesions of the right superior pubic ramus and anterior wall of the right acetabulum. New 1.7 cm lytic lesion of the  left femoral head. Widespread and increased sclerotic osseous metastatic disease in the bony pelvis. New periosteal reaction along the posterior left iliac bone likely related to a lytic mass on image 85/2. Metastatic burden in the lumbar  spine has likewise mildly increased. New superior endplate compression fracture and inferior endplate compression fracture at the L4 level. IMPRESSION: 1. Worsening bony metastatic disease especially perceptible in the pelvis and lumbar spine. New compression fractures at T9, T11, and L4. 2. Probable pathologic fracture in the left ischium, nondisplaced. Worsening lytic metastatic lesions of the right superior pubic ramus and anterior wall of the right acetabulum as well as the left femoral head. 3. Enlarging left infrahilar mass/conglomerate adenopathy, 1.9 cm in diameter. Stable previously hypermetabolic left lower lobe pulmonary nodule. 4. Stable distribution of scattered hepatic metastatic lesions compared to the prior MRI. 5. Considerable atelectatic portions the right middle lobe and right lower lobe due to the right pleural effusion. These atelectatic regions were shown to harbor metastatic lesions on the prior PET-CT. 6. New extrahepatic biliary dilatation up to 1.3 cm. There is cholelithiasis and strictly speaking I can not exclude choledocholithiasis as a potential cause. Hepatobiliary sonography may be warranted. 7. Other imaging findings of potential clinical significance: Moderate right and trace left pleural effusion. Aortic Atherosclerosis (ICD10-I70.0). Electronically Signed   By: Van Clines M.D.   On: 09/09/2018 14:14   [place] Ekg Site Rite  Result Date: 09/12/2018 If Site Rite image not attached, placement could not be confirmed due to current cardiac rhythm.     LAB RESULTS: Basic Metabolic Panel: Recent Labs  Lab 09/11/18 0341 09/12/18 1018  NA 136 135  K 3.2* 3.4*  CL 93* 95*  CO2 28 27  GLUCOSE 165* 194*  BUN 5* 10  CREATININE 0.44 0.47  CALCIUM 10.2 9.3   Liver Function Tests: Recent Labs  Lab 09/09/18 1059 09/11/18 0341  AST 76* 81*  ALT 29 33  ALKPHOS 171* 149*  BILITOT 1.1 1.3*  PROT 7.5 7.8  ALBUMIN 3.0* 3.4*   No results for input(s): LIPASE,  AMYLASE in the last 168 hours. No results for input(s): AMMONIA in the last 168 hours. CBC: Recent Labs  Lab 09/09/18 1059  09/12/18 0309 09/13/18 0604  WBC 6.5   < > 7.8 9.9  NEUTROABS 4.5  --   --   --   HGB 12.2   < > 12.5 12.8  HCT 36.3   < > 38.1 38.3  MCV 112.0*   < > 111.1* 113.6*  PLT 220   < > 210 212   < > = values in this interval not displayed.   Cardiac Enzymes: No results for input(s): CKTOTAL, CKMB, CKMBINDEX, TROPONINI in the last 168 hours. BNP: Invalid input(s): POCBNP CBG: No results for input(s): GLUCAP in the last 168 hours.    Disposition and Follow-up:    DISPOSITION: Home with hospice  DISCHARGE FOLLOW-UP Follow-up Information    Unk Pinto, MD. Schedule an appointment as soon as possible for a visit in 2 week(s).   Specialty:  Internal Medicine Why:  as needed  Contact information: [address] [place] [address] [phone]            Time coordinating discharge:  35 minutes  Signed:   Estill Cotta M.D. [facility] 09/13/2018, 1:27 PM Pager: [phone]

## 2018-09-13 NOTE — Progress Notes (Signed)
[facility]                                                                              Patient Demographics  Suzanne [name], is a 63 y.o. female, DOB - 07/16/1956, HBZ:[id number]  Admit date - 09/09/2018   Admitting Physician Sid Falcon, MD  Outpatient Primary MD for the patient is Unk Pinto, MD  Outpatient specialists:   LOS - 4  days   Medical records reviewed and are as summarized below:    No chief complaint on file.      Brief summary   Suzanne [name] is a 63 y.o. female with medical history significant of EtOH abuse, metastatic breast cancer, depression, COPD, CAD, history of stent placement, hyperlipidemia, hypertension, insomnia  who was sent from cancer center due to progressively worse back pain since last week after the patient had a fall while trying going to the bathroom in her house at night with very daily illumination.  She denied head trauma or LOC.    CT abdomen pelvis showed worsening bony metastatic disease specially in the pelvis and lumbar spine, new compression fractures T9, T11, L4.  Probable pathological fracture in the left ischium, nondisplaced, worsening lytic metastatic lesions in the right superior pubic rami and anterior wall of right establishment as well as the left femoral head.  Assessment & Plan    Principal Problem: Intractable pain secondary to worsening bony metastatic disease in the thoracic, lumbar spine, pelvis  - CT abdomen pelvis showed worsening bony metastatic disease specially in the pelvis and lumbar spine, new compression fractures T9, T11, L4.  Probable pathological fracture in the left ischium, nondisplaced, worsening lytic metastatic lesions in the right superior pubic rami and anterior wall of right establishment as well as the left femoral head. -Patient placed on Dilaudid PCA, fentanyl patch, started on Decadron, highly appreciate palliative medicine for assisting with pain management.   -Patient has  declined radiation to the bony metastasis -Constipation resolved.  Continue bowel regimen. - Continue comfort care, PICC line placed -Await recommendations from palliative medicine for discharge with PCA  Active Problems: Metastatic right breast cancer -Per oncology, further chemotherapy would be futile  -Focus now on pain control and comfort    GERD (gastroesophageal reflux disease) -Continue PPI    COPD with asthma ([place]) -Continue duo nebs, albuterol as needed, O2   Hypercalcemia -Improving, status post Aredia IV x1, on Decadron  Code Status: DNR status DVT Prophylaxis:  Lovenox Family Communication: Discussed in detail with the patient, all imaging results, lab results explained to the patient    Disposition Plan: Hopefully DC home with hospice and PCA today  Time Spent in minutes 25 minutes  Procedures:  CT abdomen and pelvis  Consultants:   Oncology Palliative medicine  Antimicrobials:   Anti-infectives (From admission, onward)   None         Medications  Scheduled Meds: . dexamethasone  4 mg Intravenous Q12H  . enoxaparin (LOVENOX) injection  40 mg Subcutaneous Q24H  . fentaNYL  1 patch Transdermal Q72H  . HYDROmorphone   Intravenous Q4H  . ipratropium-albuterol  3 mL Nebulization Q6H  WA  . loratadine  10 mg Oral Daily  . LORazepam  1-2 mg Oral QHS  . pantoprazole  40 mg Oral Daily  . senna  2 tablet Oral QHS  . sodium chloride flush  10-40 mL Intracatheter Q12H   Continuous Infusions: . sodium chloride 10 mL/hr at 09/13/18 0603   PRN Meds:.sodium chloride, acetaminophen **OR** acetaminophen, albuterol, LORazepam, magnesium citrate, ondansetron (ZOFRAN) IV, prochlorperazine **OR** prochlorperazine **OR** prochlorperazine, promethazine, sodium chloride flush      Subjective:   Suzanne [name] was seen and examined today.  On Dilaudid PCA, pain currently controlled, uncomfortable in the hospital bed.  Had a BM yesterday per patient.   Patient  denies dizziness, chest pain, shortness of breath, abdominal pain.    Objective:   Vitals:   09/13/18 0602 09/13/18 0736 09/13/18 0800 09/13/18 1239  BP: (!) 145/97   (!) 157/106  Pulse: (!) 107   (!) 105  Resp: 20  16 20   Temp: 98.2 F (36.8 C)     TempSrc: Oral     SpO2: 95% 93% 95% 96%  Weight:      Height:       No intake or output data in the 24 hours ending 09/13/18 1251   Wt Readings from Last 3 Encounters:  09/09/18 73.6 kg  09/09/18 73.6 kg  09/05/18 74.1 kg   Physical Exam  General: Alert and oriented x 3, NAD, ill-appearing  Eyes:   HEENT:   Cardiovascular: S1 S2 clear, RRR. No pedal edema b/l  Respiratory: Mild scattered wheezing  Gastrointestinal: Soft, nontender, nondistended, NBS  Ext: no pedal edema bilaterally  Neuro: no new deficits  Musculoskeletal: No cyanosis, clubbing  Skin: No rashes  Psych: Normal affect and demeanor, alert and oriented x3    Data Reviewed:  I have personally reviewed following labs and imaging studies  Micro Results No results found for this or any previous visit (from the past 240 hour(s)).  Radiology Reports Dg Chest 2 View  Result Date: 09/05/2018 CLINICAL DATA:  History of breast cancer with wheezing and shortness of breath. EXAM: CHEST - 2 VIEW COMPARISON:  June 25, 2016 FINDINGS: The heart size and mediastinal contours are stable. Moderate to large right pleural effusion with consolidation of right mid to lung base are noted. The left lung is clear. The visualized skeletal structures are stable. IMPRESSION: Moderate to large right pleural effusion with consolidation of right mid to lung base are noted. Underlying pneumonia is not excluded. Electronically Signed   By: Abelardo Diesel M.D.   On: 09/05/2018 13:34   Ct Chest W Contrast  Result Date: 09/09/2018 CLINICAL DATA:  Metastatic breast cancer currently on Xeloda palliative chemotherapy. Worsening dyspnea. Bone pain. EXAM: CT CHEST, ABDOMEN, AND PELVIS  WITH CONTRAST TECHNIQUE: Multidetector CT imaging of the chest, abdomen and pelvis was performed following the standard protocol during bolus administration of intravenous contrast. CONTRAST:  17mL OMNIPAQUE IOHEXOL 300 MG/ML  SOLN COMPARISON:  PET-CT 07/28/2018 FINDINGS: CT CHEST FINDINGS Cardiovascular: Unremarkable Mediastinum/Nodes: Left infrahilar node or nodule measuring 1.9 by 1.9 cm on image 25/2 is observed. There were previously smaller hypermetabolic lymph nodes in this vicinity on 07/28/2018. Lungs/Pleura: Moderate right pleural effusion without a well-defined pleural mass, but potentially with some mild loculation. Considerable atelectatic portions of the right middle lobe and right lower lobe which previously had hypermetabolic elements compatible with underlying metastatic lesions. A left lower lobe nodule was previously hypermetabolic and currently measures 1.2 cm in short axis thickness on image  83/6, formerly the same on prior PET-CT where the lesion was hypermetabolic. Trace left pleural effusion. Musculoskeletal: Widespread osseous metastatic disease is primarily sclerotic. Compared to 03/31/2018, there is a new 40% compression fracture at T9 and a new 15% superior endplate compression fracture at T11. Old healed sternal fracture. Primarily similar distribution of osseous metastatic disease compared to previous. Reconstruction related findings in the right breast, including a fluid collection along the right pectoralis muscle. CT ABDOMEN PELVIS FINDINGS Hepatobiliary: Scattered hepatic metastatic lesions are similar in distribution to the 07/28/2018 exam, but increased from the 03/31/2018 diagnostic CT examination. Cholelithiasis present potentially with mild gallbladder wall thickening. Extrahepatic biliary dilatation with CBD at 1.3 cm. This is significantly increased from 07/28/2018, raise the possibility of choledocholithiasis. No overt intrahepatic biliary dilatation. An index segment 4  enhancing metastatic lesion measures 1.5 by 1.5 cm on image 34/2. Pancreas: Unremarkable Spleen: Unremarkable Adrenals/Urinary Tract: Unremarkable Stomach/Bowel: Unremarkable Vascular/Lymphatic: Mild aortoiliac atherosclerotic vascular calcification. Reproductive: Uterus absent.  Adnexa unremarkable. Other: No supplemental non-categorized findings. Musculoskeletal: Prior left tram flap procedure. Worsening metastatic disease in the left ischium with periostitis and suspicion for a small nondisplaced pathologic fracture of the left ischium on image 116/2. Worsening lytic lesions of the right superior pubic ramus and anterior wall of the right acetabulum. New 1.7 cm lytic lesion of the left femoral head. Widespread and increased sclerotic osseous metastatic disease in the bony pelvis. New periosteal reaction along the posterior left iliac bone likely related to a lytic mass on image 85/2. Metastatic burden in the lumbar spine has likewise mildly increased. New superior endplate compression fracture and inferior endplate compression fracture at the L4 level. IMPRESSION: 1. Worsening bony metastatic disease especially perceptible in the pelvis and lumbar spine. New compression fractures at T9, T11, and L4. 2. Probable pathologic fracture in the left ischium, nondisplaced. Worsening lytic metastatic lesions of the right superior pubic ramus and anterior wall of the right acetabulum as well as the left femoral head. 3. Enlarging left infrahilar mass/conglomerate adenopathy, 1.9 cm in diameter. Stable previously hypermetabolic left lower lobe pulmonary nodule. 4. Stable distribution of scattered hepatic metastatic lesions compared to the prior MRI. 5. Considerable atelectatic portions the right middle lobe and right lower lobe due to the right pleural effusion. These atelectatic regions were shown to harbor metastatic lesions on the prior PET-CT. 6. New extrahepatic biliary dilatation up to 1.3 cm. There is cholelithiasis  and strictly speaking I can not exclude choledocholithiasis as a potential cause. Hepatobiliary sonography may be warranted. 7. Other imaging findings of potential clinical significance: Moderate right and trace left pleural effusion. Aortic Atherosclerosis (ICD10-I70.0). Electronically Signed   By: Van Clines M.D.   On: 09/09/2018 14:14   Ct Abdomen Pelvis W Contrast  Result Date: 09/09/2018 CLINICAL DATA:  Metastatic breast cancer currently on Xeloda palliative chemotherapy. Worsening dyspnea. Bone pain. EXAM: CT CHEST, ABDOMEN, AND PELVIS WITH CONTRAST TECHNIQUE: Multidetector CT imaging of the chest, abdomen and pelvis was performed following the standard protocol during bolus administration of intravenous contrast. CONTRAST:  136mL OMNIPAQUE IOHEXOL 300 MG/ML  SOLN COMPARISON:  PET-CT 07/28/2018 FINDINGS: CT CHEST FINDINGS Cardiovascular: Unremarkable Mediastinum/Nodes: Left infrahilar node or nodule measuring 1.9 by 1.9 cm on image 25/2 is observed. There were previously smaller hypermetabolic lymph nodes in this vicinity on 07/28/2018. Lungs/Pleura: Moderate right pleural effusion without a well-defined pleural mass, but potentially with some mild loculation. Considerable atelectatic portions of the right middle lobe and right lower lobe which previously had hypermetabolic elements compatible  with underlying metastatic lesions. A left lower lobe nodule was previously hypermetabolic and currently measures 1.2 cm in short axis thickness on image 83/6, formerly the same on prior PET-CT where the lesion was hypermetabolic. Trace left pleural effusion. Musculoskeletal: Widespread osseous metastatic disease is primarily sclerotic. Compared to 03/31/2018, there is a new 40% compression fracture at T9 and a new 15% superior endplate compression fracture at T11. Old healed sternal fracture. Primarily similar distribution of osseous metastatic disease compared to previous. Reconstruction related findings in  the right breast, including a fluid collection along the right pectoralis muscle. CT ABDOMEN PELVIS FINDINGS Hepatobiliary: Scattered hepatic metastatic lesions are similar in distribution to the 07/28/2018 exam, but increased from the 03/31/2018 diagnostic CT examination. Cholelithiasis present potentially with mild gallbladder wall thickening. Extrahepatic biliary dilatation with CBD at 1.3 cm. This is significantly increased from 07/28/2018, raise the possibility of choledocholithiasis. No overt intrahepatic biliary dilatation. An index segment 4 enhancing metastatic lesion measures 1.5 by 1.5 cm on image 34/2. Pancreas: Unremarkable Spleen: Unremarkable Adrenals/Urinary Tract: Unremarkable Stomach/Bowel: Unremarkable Vascular/Lymphatic: Mild aortoiliac atherosclerotic vascular calcification. Reproductive: Uterus absent.  Adnexa unremarkable. Other: No supplemental non-categorized findings. Musculoskeletal: Prior left tram flap procedure. Worsening metastatic disease in the left ischium with periostitis and suspicion for a small nondisplaced pathologic fracture of the left ischium on image 116/2. Worsening lytic lesions of the right superior pubic ramus and anterior wall of the right acetabulum. New 1.7 cm lytic lesion of the left femoral head. Widespread and increased sclerotic osseous metastatic disease in the bony pelvis. New periosteal reaction along the posterior left iliac bone likely related to a lytic mass on image 85/2. Metastatic burden in the lumbar spine has likewise mildly increased. New superior endplate compression fracture and inferior endplate compression fracture at the L4 level. IMPRESSION: 1. Worsening bony metastatic disease especially perceptible in the pelvis and lumbar spine. New compression fractures at T9, T11, and L4. 2. Probable pathologic fracture in the left ischium, nondisplaced. Worsening lytic metastatic lesions of the right superior pubic ramus and anterior wall of the right  acetabulum as well as the left femoral head. 3. Enlarging left infrahilar mass/conglomerate adenopathy, 1.9 cm in diameter. Stable previously hypermetabolic left lower lobe pulmonary nodule. 4. Stable distribution of scattered hepatic metastatic lesions compared to the prior MRI. 5. Considerable atelectatic portions the right middle lobe and right lower lobe due to the right pleural effusion. These atelectatic regions were shown to harbor metastatic lesions on the prior PET-CT. 6. New extrahepatic biliary dilatation up to 1.3 cm. There is cholelithiasis and strictly speaking I can not exclude choledocholithiasis as a potential cause. Hepatobiliary sonography may be warranted. 7. Other imaging findings of potential clinical significance: Moderate right and trace left pleural effusion. Aortic Atherosclerosis (ICD10-I70.0). Electronically Signed   By: Van Clines M.D.   On: 09/09/2018 14:14   [place] Ekg Site Rite  Result Date: 09/12/2018 If Site Rite image not attached, placement could not be confirmed due to current cardiac rhythm.   Lab Data:  CBC: Recent Labs  Lab 09/09/18 1059 09/11/18 0341 09/12/18 0309 09/13/18 0604  WBC 6.5 6.6 7.8 9.9  NEUTROABS 4.5  --   --   --   HGB 12.2 13.9 12.5 12.8  HCT 36.3 41.2 38.1 38.3  MCV 112.0* 112.6* 111.1* 113.6*  PLT 220 242 210 297   Basic Metabolic Panel: Recent Labs  Lab 09/09/18 1059 09/11/18 0341 09/12/18 1018  NA 139 136 135  K 3.0* 3.2* 3.4*  CL 88*  93* 95*  CO2 36* 28 27  GLUCOSE 142* 165* 194*  BUN 9 5* 10  CREATININE 0.76 0.44 0.47  CALCIUM 11.3* 10.2 9.3   GFR: Estimated Creatinine Clearance: 70.2 mL/min (by C-G formula based on SCr of 0.47 mg/dL). Liver Function Tests: Recent Labs  Lab 09/09/18 1059 09/11/18 0341  AST 76* 81*  ALT 29 33  ALKPHOS 171* 149*  BILITOT 1.1 1.3*  PROT 7.5 7.8  ALBUMIN 3.0* 3.4*   No results for input(s): LIPASE, AMYLASE in the last 168 hours. No results for input(s): AMMONIA in the  last 168 hours. Coagulation Profile: No results for input(s): INR, PROTIME in the last 168 hours. Cardiac Enzymes: No results for input(s): CKTOTAL, CKMB, CKMBINDEX, TROPONINI in the last 168 hours. BNP (last 3 results) No results for input(s): PROBNP in the last 8760 hours. HbA1C: No results for input(s): HGBA1C in the last 72 hours. CBG: No results for input(s): GLUCAP in the last 168 hours. Lipid Profile: No results for input(s): CHOL, HDL, LDLCALC, TRIG, CHOLHDL, LDLDIRECT in the last 72 hours. Thyroid Function Tests: No results for input(s): TSH, T4TOTAL, FREET4, T3FREE, THYROIDAB in the last 72 hours. Anemia Panel: No results for input(s): VITAMINB12, FOLATE, FERRITIN, TIBC, IRON, RETICCTPCT in the last 72 hours. Urine analysis:    Component Value Date/Time   COLORURINE DARK YELLOW 02/10/2018 1537   APPEARANCEUR TURBID (A) 02/10/2018 1537   APPEARANCEUR Clear 12/07/2013 1843   LABSPEC 1.024 02/10/2018 1537   LABSPEC 1.003 12/07/2013 1843   LABSPEC 1.020 06/27/2006 1640   PHURINE 5.5 02/10/2018 1537   GLUCOSEU 2+ (A) 02/10/2018 1537   GLUCOSEU Negative 12/07/2013 1843   HGBUR NEGATIVE 02/10/2018 1537   BILIRUBINUR NEGATIVE 08/29/2015 1517   BILIRUBINUR Negative 12/07/2013 1843   BILIRUBINUR Negative 06/27/2006 1640   KETONESUR 1+ (A) 02/10/2018 1537   PROTEINUR TRACE (A) 02/10/2018 1537   UROBILINOGEN 0.2 08/26/2014 1759   NITRITE NEGATIVE 08/29/2015 1517   LEUKOCYTESUR NEGATIVE 08/29/2015 1517   LEUKOCYTESUR Negative 12/07/2013 1843   LEUKOCYTESUR Negative 06/27/2006 1640     Amity Roes M.D. [facility] 09/13/2018, 12:51 PM  Pager: [phone] Between 7am to 7pm - call Pager - [phone]  After 7pm go to [email or web address] - password [email or web address]  Call night coverage person covering after 7pm

## 2018-09-14 MED ORDER — IPRATROPIUM-ALBUTEROL 0.5-2.5 (3) MG/3ML IN SOLN: 3.0000 mL | Freq: Three times a day (TID) | RESPIRATORY_TRACT | Status: DC

## 2018-09-14 NOTE — Care Management Note (Signed)
Case Management Note  Patient Details  Name: Suzanne [name] MRN: [id number] Date of Birth: Jan 11, 1956  Subjective/Objective:   Breast Cancer with mets to bone, COPD                 Action/Plan: Received call from [place], spoke to [employer], Plan is for admission today after pt is dc home. DME was delivered at 8 pm on last night. PTAR arranged. Hospice RN requested call when pt has left at [phone].  Expected Discharge Date:  09/13/18               Expected Discharge Plan:  Home w Hospice Care  In-House Referral:  NA  Discharge planning Services  CM Consult  Post Acute Care Choice:  Hospice Choice offered to:  Spouse  DME Arranged:  Other see comment DME Agency:  [place]:  RN [facility] Agency:  [place]  Status of Service:  Completed, signed off  If discussed at [place] of Stay Meetings, dates discussed:    Additional Comments:  Erenest Rasher, RN 09/14/2018, 9:25 AM

## 2018-09-14 NOTE — Discharge Summary (Signed)
Physician Discharge Summary   Patient ID: MAHI ZABRISKIE MRN: [id number] DOB/AGE: 12/25/1955 63 y.o.  Admit date: 09/09/2018 Discharge date: 09/14/2018  Primary Care Physician:  Unk Pinto, MD   Recommendations for Outpatient Follow-up:  1. Patient placed on Dilaudid PCA, PICC line has been placed, for home hospice 2. DNR/DNI comfort care  Home Health: None  Equipment/Devices:   Discharge Condition: Guarded, overall poor prognosis CODE STATUS: DNR Diet recommendation: Regular diet, comfort care   Discharge Diagnoses:   Intractable pain secondary to worsening bony metastatic disease in the thoracic, lumbar spine and pelvis . Compression fracture of thoracic vertebra (HCC) . Bone metastases ([place]) . COPD with asthma ([place]) . GERD (gastroesophageal reflux disease) . Metastatic right breast cancer ([place]) . Hypokalemia . Hypercalcemia    Consults: Palliative medicine Oncology    Allergies:   Allergies  Allergen Reactions  . Ace Inhibitors     cough     DISCHARGE MEDICATIONS: Allergies as of 09/14/2018      Reactions   Ace Inhibitors    cough      Medication List    STOP taking these medications   bisoprolol 5 MG tablet Commonly known as:  ZEBETA   fluticasone 50 MCG/ACT nasal spray Commonly known as:  FLONASE   furosemide 40 MG tablet Commonly known as:  LASIX   HYDROmorphone 4 MG tablet Commonly known as:  DILAUDID     TAKE these medications   acetaminophen 325 MG tablet Commonly known as:  TYLENOL Take 2 tablets (650 mg total) by mouth every 6 (six) hours as needed for mild pain (over the counter).   albuterol (2.5 MG/3ML) 0.083% nebulizer solution Commonly known as:  PROVENTIL INHALE 1 VIAL VIA NEBULIZER EVERY 6 HOURS AS NEEDED FOR WHEEZING OR SHORTNESS OF BREATH   albuterol 108 (90 Base) MCG/ACT inhaler Commonly known as:  PROAIR HFA INHALE 1 PUFF BY MOUTH EVERY 6 HOURS AS NEEDED FOR WHEEZING OR SHORTNESS OF BREATH   blood glucose meter  kit and supplies Kit Dispense based on patient and insurance preference. Use up to four times daily as directed. (FOR ICD-9 250.00, 250.01).   calcium carbonate 1250 (500 Ca) MG tablet Commonly known as:  OS-CAL - dosed in mg of elemental calcium Take 1 tablet by mouth.   cholecalciferol 1000 units tablet Commonly known as:  VITAMIN D Take 1,000 Units by mouth daily.   dexamethasone 4 MG tablet Commonly known as:  DECADRON Take 1 tablet (4 mg total) by mouth 2 (two) times daily with a meal.   fentaNYL 50 MCG/HR Commonly known as:  [place] 1 patch onto the skin every 3 (three) days. What changed:  how much to take   HYDROmorphone 1 mg/mL injection Commonly known as:  DILAUDID Inject 1 mL (1 mg total) into the vein every 4 (four) hours.   ibuprofen 200 MG tablet Commonly known as:  ADVIL,MOTRIN Take 200 mg by mouth every 6 (six) hours as needed.   loratadine 10 MG tablet Commonly known as:  CLARITIN Take 1 tablet (10 mg total) by mouth daily.   LORazepam 2 MG tablet Commonly known as:  ATIVAN Take 0.5-1 tablets (1-2 mg total) by mouth every 6 (six) hours as needed for anxiety. What changed:    when to take this  reasons to take this   ondansetron 8 MG tablet Commonly known as:  ZOFRAN Take 1 tablet (8 mg total) by mouth every 8 (eight) hours as needed for nausea or vomiting. What  changed:    how much to take  how to take this  when to take this  reasons to take this  additional instructions   pantoprazole 40 MG tablet Commonly known as:  PROTONIX Take 1 tablet (40 mg total) by mouth daily.   potassium chloride SA 20 MEQ tablet Commonly known as:  K-DUR,KLOR-CON Take 1 tablet twice daily.   prochlorperazine 5 MG tablet Commonly known as:  COMPAZINE Take 1 tablet (5 mg total) by mouth every 6 (six) hours as needed for nausea or refractory nausea / vomiting. What changed:    medication strength  how much to take  reasons to take this    promethazine 25 MG tablet Commonly known as:  PHENERGAN TAKE 1 TABLET BY MOUTH EVERY 6 HOURS AS NEEDED FOR NAUSEA.   senna 8.6 MG Tabs tablet Commonly known as:  SENOKOT Take 2 tablets (17.2 mg total) by mouth at bedtime.   TRELEGY ELLIPTA 100-62.5-25 MCG/INH Aepb Generic drug:  Fluticasone-Umeclidin-Vilant INHALE 1 PUFF INTO THE LUNGS DAILY.        Brief H and P: For complete details please refer to admission H and P, but in brief CARALINA NOP a 63 y.o.femalewith medical history significant ofEtOH abuse, metastatic breast cancer, depression, COPD, CAD, history of stent placement, hyperlipidemia, hypertension, insomnia who was sent from cancer center due to progressively worse back pain since last week after the patient had a fall while trying going to the bathroom in her house at night with very daily illumination. She denied head trauma or LOC.    CT abdomen pelvis showed worsening bony metastatic disease specially in the pelvis and lumbar spine, new compression fractures T9, T11, L4.  Probable pathological fracture in the left ischium, nondisplaced, worsening lytic metastatic lesions in the right superior pubic rami and anterior wall of right establishment as well as the left femoral head.  Hospital Course:   Intractable pain secondary to worsening bony metastatic disease in the thoracic, lumbar spine, pelvis  - CT abdomen pelvis showed worsening bony metastatic disease specially in the pelvis and lumbar spine, new compression fractures T9, T11, L4.  Probable pathological fracture in the left ischium, nondisplaced, worsening lytic metastatic lesions in the right superior pubic rami and anterior wall of right establishment as well as the left femoral head. -Patient placed on Dilaudid PCA, fentanyl patch, started on Decadron, highly appreciate palliative medicine for assisting with pain management.   -Patient has declined radiation to the bony metastasis -Plan on focus on the  comfort care, pain management -Constipation resolved with mag citrate -Reviewed palliative medicine, Dr. Kirstie Mirza recommendations. Patient now agreeable with the plan for PCA at home.  PICC line ordered.  Metastatic right breast cancer -Per oncology, further chemotherapy would be futile  -Focus now on pain control and comfort    GERD (gastroesophageal reflux disease) -Continue PPI    COPD with asthma ([place]) -continue scheduled duo nebs and albuterol as needed, continue supplemental O2   Hypokalemia -Replaced  Hypercalcemia -Calcium improving, status post 1 dose of [place], now on Decadron, calcium improving  Day of Discharge S: Pain controlled with PCA, has some wheezing, looking forward to DC home with hospice today  BP (!) 148/107 (BP Location: Right Arm)   Pulse (!) 107   Temp 98 F (36.7 C) (Oral)   Resp 20   Ht 5' 2.5" (1.588 m)   Wt 73.6 kg   SpO2 99%   BMI 29.19 kg/m   Physical Exam:   General:  Alert and oriented x 3, ill-appearing  Eyes:   Cardiovascular: S1 S2 clear, no murmurs, RRR. No pedal edema b/l  Respiratory: Bilateral scattered wheezing  Gastrointestinal: Soft, nontender, nondistended, NBS  Ext: no pedal edema bilaterally  Neuro: no new deficits  Musculoskeletal: No cyanosis, clubbing  Skin: No rashes  Psych: Flat affect.   The results of significant diagnostics from this hospitalization (including imaging, microbiology, ancillary and laboratory) are listed below for reference.      Procedures/Studies:  Dg Chest 2 View  Result Date: 09/05/2018 CLINICAL DATA:  History of breast cancer with wheezing and shortness of breath. EXAM: CHEST - 2 VIEW COMPARISON:  June 25, 2016 FINDINGS: The heart size and mediastinal contours are stable. Moderate to large right pleural effusion with consolidation of right mid to lung base are noted. The left lung is clear. The visualized skeletal structures are stable. IMPRESSION: Moderate to large  right pleural effusion with consolidation of right mid to lung base are noted. Underlying pneumonia is not excluded. Electronically Signed   By: Abelardo Diesel M.D.   On: 09/05/2018 13:34   Ct Chest W Contrast  Result Date: 09/09/2018 CLINICAL DATA:  Metastatic breast cancer currently on Xeloda palliative chemotherapy. Worsening dyspnea. Bone pain. EXAM: CT CHEST, ABDOMEN, AND PELVIS WITH CONTRAST TECHNIQUE: Multidetector CT imaging of the chest, abdomen and pelvis was performed following the standard protocol during bolus administration of intravenous contrast. CONTRAST:  147m OMNIPAQUE IOHEXOL 300 MG/ML  SOLN COMPARISON:  PET-CT 07/28/2018 FINDINGS: CT CHEST FINDINGS Cardiovascular: Unremarkable Mediastinum/Nodes: Left infrahilar node or nodule measuring 1.9 by 1.9 cm on image 25/2 is observed. There were previously smaller hypermetabolic lymph nodes in this vicinity on 07/28/2018. Lungs/Pleura: Moderate right pleural effusion without a well-defined pleural mass, but potentially with some mild loculation. Considerable atelectatic portions of the right middle lobe and right lower lobe which previously had hypermetabolic elements compatible with underlying metastatic lesions. A left lower lobe nodule was previously hypermetabolic and currently measures 1.2 cm in short axis thickness on image 83/6, formerly the same on prior PET-CT where the lesion was hypermetabolic. Trace left pleural effusion. Musculoskeletal: Widespread osseous metastatic disease is primarily sclerotic. Compared to 03/31/2018, there is a new 40% compression fracture at T9 and a new 15% superior endplate compression fracture at T11. Old healed sternal fracture. Primarily similar distribution of osseous metastatic disease compared to previous. Reconstruction related findings in the right breast, including a fluid collection along the right pectoralis muscle. CT ABDOMEN PELVIS FINDINGS Hepatobiliary: Scattered hepatic metastatic lesions are  similar in distribution to the 07/28/2018 exam, but increased from the 03/31/2018 diagnostic CT examination. Cholelithiasis present potentially with mild gallbladder wall thickening. Extrahepatic biliary dilatation with CBD at 1.3 cm. This is significantly increased from 07/28/2018, raise the possibility of choledocholithiasis. No overt intrahepatic biliary dilatation. An index segment 4 enhancing metastatic lesion measures 1.5 by 1.5 cm on image 34/2. Pancreas: Unremarkable Spleen: Unremarkable Adrenals/Urinary Tract: Unremarkable Stomach/Bowel: Unremarkable Vascular/Lymphatic: Mild aortoiliac atherosclerotic vascular calcification. Reproductive: Uterus absent.  Adnexa unremarkable. Other: No supplemental non-categorized findings. Musculoskeletal: Prior left tram flap procedure. Worsening metastatic disease in the left ischium with periostitis and suspicion for a small nondisplaced pathologic fracture of the left ischium on image 116/2. Worsening lytic lesions of the right superior pubic ramus and anterior wall of the right acetabulum. New 1.7 cm lytic lesion of the left femoral head. Widespread and increased sclerotic osseous metastatic disease in the bony pelvis. New periosteal reaction along the posterior left iliac bone likely related  to a lytic mass on image 85/2. Metastatic burden in the lumbar spine has likewise mildly increased. New superior endplate compression fracture and inferior endplate compression fracture at the L4 level. IMPRESSION: 1. Worsening bony metastatic disease especially perceptible in the pelvis and lumbar spine. New compression fractures at T9, T11, and L4. 2. Probable pathologic fracture in the left ischium, nondisplaced. Worsening lytic metastatic lesions of the right superior pubic ramus and anterior wall of the right acetabulum as well as the left femoral head. 3. Enlarging left infrahilar mass/conglomerate adenopathy, 1.9 cm in diameter. Stable previously hypermetabolic left lower  lobe pulmonary nodule. 4. Stable distribution of scattered hepatic metastatic lesions compared to the prior MRI. 5. Considerable atelectatic portions the right middle lobe and right lower lobe due to the right pleural effusion. These atelectatic regions were shown to harbor metastatic lesions on the prior PET-CT. 6. New extrahepatic biliary dilatation up to 1.3 cm. There is cholelithiasis and strictly speaking I can not exclude choledocholithiasis as a potential cause. Hepatobiliary sonography may be warranted. 7. Other imaging findings of potential clinical significance: Moderate right and trace left pleural effusion. Aortic Atherosclerosis (ICD10-I70.0). Electronically Signed   By: Van Clines M.D.   On: 09/09/2018 14:14   Ct Abdomen Pelvis W Contrast  Result Date: 09/09/2018 CLINICAL DATA:  Metastatic breast cancer currently on Xeloda palliative chemotherapy. Worsening dyspnea. Bone pain. EXAM: CT CHEST, ABDOMEN, AND PELVIS WITH CONTRAST TECHNIQUE: Multidetector CT imaging of the chest, abdomen and pelvis was performed following the standard protocol during bolus administration of intravenous contrast. CONTRAST:  119m OMNIPAQUE IOHEXOL 300 MG/ML  SOLN COMPARISON:  PET-CT 07/28/2018 FINDINGS: CT CHEST FINDINGS Cardiovascular: Unremarkable Mediastinum/Nodes: Left infrahilar node or nodule measuring 1.9 by 1.9 cm on image 25/2 is observed. There were previously smaller hypermetabolic lymph nodes in this vicinity on 07/28/2018. Lungs/Pleura: Moderate right pleural effusion without a well-defined pleural mass, but potentially with some mild loculation. Considerable atelectatic portions of the right middle lobe and right lower lobe which previously had hypermetabolic elements compatible with underlying metastatic lesions. A left lower lobe nodule was previously hypermetabolic and currently measures 1.2 cm in short axis thickness on image 83/6, formerly the same on prior PET-CT where the lesion was  hypermetabolic. Trace left pleural effusion. Musculoskeletal: Widespread osseous metastatic disease is primarily sclerotic. Compared to 03/31/2018, there is a new 40% compression fracture at T9 and a new 15% superior endplate compression fracture at T11. Old healed sternal fracture. Primarily similar distribution of osseous metastatic disease compared to previous. Reconstruction related findings in the right breast, including a fluid collection along the right pectoralis muscle. CT ABDOMEN PELVIS FINDINGS Hepatobiliary: Scattered hepatic metastatic lesions are similar in distribution to the 07/28/2018 exam, but increased from the 03/31/2018 diagnostic CT examination. Cholelithiasis present potentially with mild gallbladder wall thickening. Extrahepatic biliary dilatation with CBD at 1.3 cm. This is significantly increased from 07/28/2018, raise the possibility of choledocholithiasis. No overt intrahepatic biliary dilatation. An index segment 4 enhancing metastatic lesion measures 1.5 by 1.5 cm on image 34/2. Pancreas: Unremarkable Spleen: Unremarkable Adrenals/Urinary Tract: Unremarkable Stomach/Bowel: Unremarkable Vascular/Lymphatic: Mild aortoiliac atherosclerotic vascular calcification. Reproductive: Uterus absent.  Adnexa unremarkable. Other: No supplemental non-categorized findings. Musculoskeletal: Prior left tram flap procedure. Worsening metastatic disease in the left ischium with periostitis and suspicion for a small nondisplaced pathologic fracture of the left ischium on image 116/2. Worsening lytic lesions of the right superior pubic ramus and anterior wall of the right acetabulum. New 1.7 cm lytic lesion of the left femoral  head. Widespread and increased sclerotic osseous metastatic disease in the bony pelvis. New periosteal reaction along the posterior left iliac bone likely related to a lytic mass on image 85/2. Metastatic burden in the lumbar spine has likewise mildly increased. New superior endplate  compression fracture and inferior endplate compression fracture at the L4 level. IMPRESSION: 1. Worsening bony metastatic disease especially perceptible in the pelvis and lumbar spine. New compression fractures at T9, T11, and L4. 2. Probable pathologic fracture in the left ischium, nondisplaced. Worsening lytic metastatic lesions of the right superior pubic ramus and anterior wall of the right acetabulum as well as the left femoral head. 3. Enlarging left infrahilar mass/conglomerate adenopathy, 1.9 cm in diameter. Stable previously hypermetabolic left lower lobe pulmonary nodule. 4. Stable distribution of scattered hepatic metastatic lesions compared to the prior MRI. 5. Considerable atelectatic portions the right middle lobe and right lower lobe due to the right pleural effusion. These atelectatic regions were shown to harbor metastatic lesions on the prior PET-CT. 6. New extrahepatic biliary dilatation up to 1.3 cm. There is cholelithiasis and strictly speaking I can not exclude choledocholithiasis as a potential cause. Hepatobiliary sonography may be warranted. 7. Other imaging findings of potential clinical significance: Moderate right and trace left pleural effusion. Aortic Atherosclerosis (ICD10-I70.0). Electronically Signed   By: Van Clines M.D.   On: 09/09/2018 14:14   [place] Ekg Site Rite  Result Date: 09/12/2018 If Site Rite image not attached, placement could not be confirmed due to current cardiac rhythm.     LAB RESULTS: Basic Metabolic Panel: Recent Labs  Lab 09/11/18 0341 09/12/18 1018  NA 136 135  K 3.2* 3.4*  CL 93* 95*  CO2 28 27  GLUCOSE 165* 194*  BUN 5* 10  CREATININE 0.44 0.47  CALCIUM 10.2 9.3   Liver Function Tests: Recent Labs  Lab 09/09/18 1059 09/11/18 0341  AST 76* 81*  ALT 29 33  ALKPHOS 171* 149*  BILITOT 1.1 1.3*  PROT 7.5 7.8  ALBUMIN 3.0* 3.4*   No results for input(s): LIPASE, AMYLASE in the last 168 hours. No results for input(s): AMMONIA  in the last 168 hours. CBC: Recent Labs  Lab 09/09/18 1059  09/12/18 0309 09/13/18 0604  WBC 6.5   < > 7.8 9.9  NEUTROABS 4.5  --   --   --   HGB 12.2   < > 12.5 12.8  HCT 36.3   < > 38.1 38.3  MCV 112.0*   < > 111.1* 113.6*  PLT 220   < > 210 212   < > = values in this interval not displayed.   Cardiac Enzymes: No results for input(s): CKTOTAL, CKMB, CKMBINDEX, TROPONINI in the last 168 hours. BNP: Invalid input(s): POCBNP CBG: No results for input(s): GLUCAP in the last 168 hours.    Disposition and Follow-up:    DISPOSITION: Home with hospice  DISCHARGE FOLLOW-UP Follow-up Information    Unk Pinto, MD. Schedule an appointment as soon as possible for a visit in 2 week(s).   Specialty:  Internal Medicine Why:  as needed  Contact information: [address] [place] [address] [phone]        [place], [place] Follow up.   Why:  Home Hospice RN - will arrange initial appointment Contact information: [address] [place] [address] [phone]            Time coordinating discharge:  25 minutes  Signed:   Estill Cotta M.D. [facility] 09/14/2018, 10:39 AM Pager: [phone]

## 2018-09-14 NOTE — Progress Notes (Signed)
[facility]                                                                              Patient Demographics  Suzanne [name], is a 63 y.o. female, DOB - 1955-11-21, ZOX:[id number]  Admit date - 09/09/2018   Admitting Physician Sid Falcon, MD  Outpatient Primary MD for the patient is Unk Pinto, MD  Outpatient specialists:   LOS - 5  days   Medical records reviewed and are as summarized below:    No chief complaint on file.      Brief summary   Suzanne [name] is a 63 y.o. female with medical history significant of EtOH abuse, metastatic breast cancer, depression, COPD, CAD, history of stent placement, hyperlipidemia, hypertension, insomnia  who was sent from cancer center due to progressively worse back pain since last week after the patient had a fall while trying going to the bathroom in her house at night with very daily illumination.  She denied head trauma or LOC.    CT abdomen pelvis showed worsening bony metastatic disease specially in the pelvis and lumbar spine, new compression fractures T9, T11, L4.  Probable pathological fracture in the left ischium, nondisplaced, worsening lytic metastatic lesions in the right superior pubic rami and anterior wall of right establishment as well as the left femoral head.  Assessment & Plan    Principal Problem: Intractable pain secondary to worsening bony metastatic disease in the thoracic, lumbar spine, pelvis  - CT abdomen pelvis showed worsening bony metastatic disease specially in the pelvis and lumbar spine, new compression fractures T9, T11, L4.  Probable pathological fracture in the left ischium, nondisplaced, worsening lytic metastatic lesions in the right superior pubic rami and anterior wall of right establishment as well as the left femoral head. -Patient placed on Dilaudid PCA, fentanyl patch, started on Decadron, highly appreciate palliative medicine for assisting with pain management.   -Patient has  declined radiation to the bony metastasis -Constipation resolved.  Continue bowel regimen. - Continue comfort care, PICC line placed -Patient stable for discharge to home with hospice, DC summary was done yesterday 09/13/2018, no changes.   Metastatic right breast cancer -Per oncology, further chemotherapy would be futile  -Focus now on pain control and comfort    GERD (gastroesophageal reflux disease) -Continue PPI    COPD with asthma ([place]) -Continue duo nebs, albuterol as needed, O2   Hypercalcemia -Improving, status post Aredia IV x1, on Decadron  Code Status: DNR status DVT Prophylaxis:  Lovenox Family Communication: Discussed in detail with the patient, all imaging results, lab results explained to the patient    Disposition Plan: DC home today with hospice  Time Spent in minutes 15 minutes  Procedures:  CT abdomen and pelvis  Consultants:   Oncology Palliative medicine  Antimicrobials:   Anti-infectives (From admission, onward)   None         Medications  Scheduled Meds: . dexamethasone  4 mg Intravenous Q12H  . enoxaparin (LOVENOX) injection  40 mg Subcutaneous Q24H  . fentaNYL  1 patch Transdermal Q72H  . HYDROmorphone   Intravenous Q4H  . ipratropium-albuterol  3  mL Nebulization TID  . loratadine  10 mg Oral Daily  . LORazepam  1-2 mg Oral QHS  . pantoprazole  40 mg Oral Daily  . senna  2 tablet Oral QHS  . sodium chloride flush  10-40 mL Intracatheter Q12H   Continuous Infusions: . sodium chloride 20 mL/hr at 09/14/18 0809   PRN Meds:.sodium chloride, acetaminophen **OR** acetaminophen, albuterol, LORazepam, magnesium citrate, ondansetron (ZOFRAN) IV, prochlorperazine **OR** prochlorperazine **OR** prochlorperazine, promethazine, sodium chloride flush      Subjective:   Amethyst Gainer was seen and examined today.  On PCA, no acute complaints, has wheezing.  Looking forward to DC home today with hospice.  Objective:   Vitals:   09/13/18  1322 09/13/18 1934 09/14/18 0538 09/14/18 0726  BP: (!) 147/120  (!) 148/107   Pulse: (!) 57  (!) 107 (!) 107  Resp: 18  16 20   Temp: 98 F (36.7 C)  98 F (36.7 C)   TempSrc: Oral  Oral   SpO2: 94% 96% 98% 99%  Weight:      Height:        Intake/Output Summary (Last 24 hours) at 09/14/2018 1036 Last data filed at 09/14/2018 0900 Gross per 24 hour  Intake 689.43 ml  Output -  Net 689.43 ml     Wt Readings from Last 3 Encounters:  09/09/18 73.6 kg  09/09/18 73.6 kg  09/05/18 74.1 kg   Physical Exam  General: Alert and oriented x 3, NAD, ill-appearing  Eyes:   HEENT:  Atraumatic, normocephalic  Cardiovascular: S1 S2 clear, no murmurs, RRR. No pedal edema b/l  Respiratory: Bilateral scattered wheezing  Gastrointestinal: Soft, nontender, nondistended, NBS  Ext: no pedal edema bilaterally  Neuro: no new deficits  Musculoskeletal: No cyanosis, clubbing  Skin: No rashes  Psych: Flat affect, alert and oriented x3    Data Reviewed:  I have personally reviewed following labs and imaging studies  Micro Results No results found for this or any previous visit (from the past 240 hour(s)).  Radiology Reports Dg Chest 2 View  Result Date: 09/05/2018 CLINICAL DATA:  History of breast cancer with wheezing and shortness of breath. EXAM: CHEST - 2 VIEW COMPARISON:  June 25, 2016 FINDINGS: The heart size and mediastinal contours are stable. Moderate to large right pleural effusion with consolidation of right mid to lung base are noted. The left lung is clear. The visualized skeletal structures are stable. IMPRESSION: Moderate to large right pleural effusion with consolidation of right mid to lung base are noted. Underlying pneumonia is not excluded. Electronically Signed   By: Abelardo Diesel M.D.   On: 09/05/2018 13:34   Ct Chest W Contrast  Result Date: 09/09/2018 CLINICAL DATA:  Metastatic breast cancer currently on Xeloda palliative chemotherapy. Worsening dyspnea. Bone  pain. EXAM: CT CHEST, ABDOMEN, AND PELVIS WITH CONTRAST TECHNIQUE: Multidetector CT imaging of the chest, abdomen and pelvis was performed following the standard protocol during bolus administration of intravenous contrast. CONTRAST:  151mL OMNIPAQUE IOHEXOL 300 MG/ML  SOLN COMPARISON:  PET-CT 07/28/2018 FINDINGS: CT CHEST FINDINGS Cardiovascular: Unremarkable Mediastinum/Nodes: Left infrahilar node or nodule measuring 1.9 by 1.9 cm on image 25/2 is observed. There were previously smaller hypermetabolic lymph nodes in this vicinity on 07/28/2018. Lungs/Pleura: Moderate right pleural effusion without a well-defined pleural mass, but potentially with some mild loculation. Considerable atelectatic portions of the right middle lobe and right lower lobe which previously had hypermetabolic elements compatible with underlying metastatic lesions. A left lower lobe nodule was previously  hypermetabolic and currently measures 1.2 cm in short axis thickness on image 83/6, formerly the same on prior PET-CT where the lesion was hypermetabolic. Trace left pleural effusion. Musculoskeletal: Widespread osseous metastatic disease is primarily sclerotic. Compared to 03/31/2018, there is a new 40% compression fracture at T9 and a new 15% superior endplate compression fracture at T11. Old healed sternal fracture. Primarily similar distribution of osseous metastatic disease compared to previous. Reconstruction related findings in the right breast, including a fluid collection along the right pectoralis muscle. CT ABDOMEN PELVIS FINDINGS Hepatobiliary: Scattered hepatic metastatic lesions are similar in distribution to the 07/28/2018 exam, but increased from the 03/31/2018 diagnostic CT examination. Cholelithiasis present potentially with mild gallbladder wall thickening. Extrahepatic biliary dilatation with CBD at 1.3 cm. This is significantly increased from 07/28/2018, raise the possibility of choledocholithiasis. No overt intrahepatic  biliary dilatation. An index segment 4 enhancing metastatic lesion measures 1.5 by 1.5 cm on image 34/2. Pancreas: Unremarkable Spleen: Unremarkable Adrenals/Urinary Tract: Unremarkable Stomach/Bowel: Unremarkable Vascular/Lymphatic: Mild aortoiliac atherosclerotic vascular calcification. Reproductive: Uterus absent.  Adnexa unremarkable. Other: No supplemental non-categorized findings. Musculoskeletal: Prior left tram flap procedure. Worsening metastatic disease in the left ischium with periostitis and suspicion for a small nondisplaced pathologic fracture of the left ischium on image 116/2. Worsening lytic lesions of the right superior pubic ramus and anterior wall of the right acetabulum. New 1.7 cm lytic lesion of the left femoral head. Widespread and increased sclerotic osseous metastatic disease in the bony pelvis. New periosteal reaction along the posterior left iliac bone likely related to a lytic mass on image 85/2. Metastatic burden in the lumbar spine has likewise mildly increased. New superior endplate compression fracture and inferior endplate compression fracture at the L4 level. IMPRESSION: 1. Worsening bony metastatic disease especially perceptible in the pelvis and lumbar spine. New compression fractures at T9, T11, and L4. 2. Probable pathologic fracture in the left ischium, nondisplaced. Worsening lytic metastatic lesions of the right superior pubic ramus and anterior wall of the right acetabulum as well as the left femoral head. 3. Enlarging left infrahilar mass/conglomerate adenopathy, 1.9 cm in diameter. Stable previously hypermetabolic left lower lobe pulmonary nodule. 4. Stable distribution of scattered hepatic metastatic lesions compared to the prior MRI. 5. Considerable atelectatic portions the right middle lobe and right lower lobe due to the right pleural effusion. These atelectatic regions were shown to harbor metastatic lesions on the prior PET-CT. 6. New extrahepatic biliary dilatation  up to 1.3 cm. There is cholelithiasis and strictly speaking I can not exclude choledocholithiasis as a potential cause. Hepatobiliary sonography may be warranted. 7. Other imaging findings of potential clinical significance: Moderate right and trace left pleural effusion. Aortic Atherosclerosis (ICD10-I70.0). Electronically Signed   By: Van Clines M.D.   On: 09/09/2018 14:14   Ct Abdomen Pelvis W Contrast  Result Date: 09/09/2018 CLINICAL DATA:  Metastatic breast cancer currently on Xeloda palliative chemotherapy. Worsening dyspnea. Bone pain. EXAM: CT CHEST, ABDOMEN, AND PELVIS WITH CONTRAST TECHNIQUE: Multidetector CT imaging of the chest, abdomen and pelvis was performed following the standard protocol during bolus administration of intravenous contrast. CONTRAST:  159mL OMNIPAQUE IOHEXOL 300 MG/ML  SOLN COMPARISON:  PET-CT 07/28/2018 FINDINGS: CT CHEST FINDINGS Cardiovascular: Unremarkable Mediastinum/Nodes: Left infrahilar node or nodule measuring 1.9 by 1.9 cm on image 25/2 is observed. There were previously smaller hypermetabolic lymph nodes in this vicinity on 07/28/2018. Lungs/Pleura: Moderate right pleural effusion without a well-defined pleural mass, but potentially with some mild loculation. Considerable atelectatic portions of the right  middle lobe and right lower lobe which previously had hypermetabolic elements compatible with underlying metastatic lesions. A left lower lobe nodule was previously hypermetabolic and currently measures 1.2 cm in short axis thickness on image 83/6, formerly the same on prior PET-CT where the lesion was hypermetabolic. Trace left pleural effusion. Musculoskeletal: Widespread osseous metastatic disease is primarily sclerotic. Compared to 03/31/2018, there is a new 40% compression fracture at T9 and a new 15% superior endplate compression fracture at T11. Old healed sternal fracture. Primarily similar distribution of osseous metastatic disease compared to  previous. Reconstruction related findings in the right breast, including a fluid collection along the right pectoralis muscle. CT ABDOMEN PELVIS FINDINGS Hepatobiliary: Scattered hepatic metastatic lesions are similar in distribution to the 07/28/2018 exam, but increased from the 03/31/2018 diagnostic CT examination. Cholelithiasis present potentially with mild gallbladder wall thickening. Extrahepatic biliary dilatation with CBD at 1.3 cm. This is significantly increased from 07/28/2018, raise the possibility of choledocholithiasis. No overt intrahepatic biliary dilatation. An index segment 4 enhancing metastatic lesion measures 1.5 by 1.5 cm on image 34/2. Pancreas: Unremarkable Spleen: Unremarkable Adrenals/Urinary Tract: Unremarkable Stomach/Bowel: Unremarkable Vascular/Lymphatic: Mild aortoiliac atherosclerotic vascular calcification. Reproductive: Uterus absent.  Adnexa unremarkable. Other: No supplemental non-categorized findings. Musculoskeletal: Prior left tram flap procedure. Worsening metastatic disease in the left ischium with periostitis and suspicion for a small nondisplaced pathologic fracture of the left ischium on image 116/2. Worsening lytic lesions of the right superior pubic ramus and anterior wall of the right acetabulum. New 1.7 cm lytic lesion of the left femoral head. Widespread and increased sclerotic osseous metastatic disease in the bony pelvis. New periosteal reaction along the posterior left iliac bone likely related to a lytic mass on image 85/2. Metastatic burden in the lumbar spine has likewise mildly increased. New superior endplate compression fracture and inferior endplate compression fracture at the L4 level. IMPRESSION: 1. Worsening bony metastatic disease especially perceptible in the pelvis and lumbar spine. New compression fractures at T9, T11, and L4. 2. Probable pathologic fracture in the left ischium, nondisplaced. Worsening lytic metastatic lesions of the right superior  pubic ramus and anterior wall of the right acetabulum as well as the left femoral head. 3. Enlarging left infrahilar mass/conglomerate adenopathy, 1.9 cm in diameter. Stable previously hypermetabolic left lower lobe pulmonary nodule. 4. Stable distribution of scattered hepatic metastatic lesions compared to the prior MRI. 5. Considerable atelectatic portions the right middle lobe and right lower lobe due to the right pleural effusion. These atelectatic regions were shown to harbor metastatic lesions on the prior PET-CT. 6. New extrahepatic biliary dilatation up to 1.3 cm. There is cholelithiasis and strictly speaking I can not exclude choledocholithiasis as a potential cause. Hepatobiliary sonography may be warranted. 7. Other imaging findings of potential clinical significance: Moderate right and trace left pleural effusion. Aortic Atherosclerosis (ICD10-I70.0). Electronically Signed   By: Van Clines M.D.   On: 09/09/2018 14:14   [place] Ekg Site Rite  Result Date: 09/12/2018 If Site Rite image not attached, placement could not be confirmed due to current cardiac rhythm.   Lab Data:  CBC: Recent Labs  Lab 09/09/18 1059 09/11/18 0341 09/12/18 0309 09/13/18 0604  WBC 6.5 6.6 7.8 9.9  NEUTROABS 4.5  --   --   --   HGB 12.2 13.9 12.5 12.8  HCT 36.3 41.2 38.1 38.3  MCV 112.0* 112.6* 111.1* 113.6*  PLT 220 242 210 035   Basic Metabolic Panel: Recent Labs  Lab 09/09/18 1059 09/11/18 0341 09/12/18 1018  NA 139 136 135  K 3.0* 3.2* 3.4*  CL 88* 93* 95*  CO2 36* 28 27  GLUCOSE 142* 165* 194*  BUN 9 5* 10  CREATININE 0.76 0.44 0.47  CALCIUM 11.3* 10.2 9.3   GFR: Estimated Creatinine Clearance: 70.2 mL/min (by C-G formula based on SCr of 0.47 mg/dL). Liver Function Tests: Recent Labs  Lab 09/09/18 1059 09/11/18 0341  AST 76* 81*  ALT 29 33  ALKPHOS 171* 149*  BILITOT 1.1 1.3*  PROT 7.5 7.8  ALBUMIN 3.0* 3.4*   No results for input(s): LIPASE, AMYLASE in the last 168  hours. No results for input(s): AMMONIA in the last 168 hours. Coagulation Profile: No results for input(s): INR, PROTIME in the last 168 hours. Cardiac Enzymes: No results for input(s): CKTOTAL, CKMB, CKMBINDEX, TROPONINI in the last 168 hours. BNP (last 3 results) No results for input(s): PROBNP in the last 8760 hours. HbA1C: No results for input(s): HGBA1C in the last 72 hours. CBG: No results for input(s): GLUCAP in the last 168 hours. Lipid Profile: No results for input(s): CHOL, HDL, LDLCALC, TRIG, CHOLHDL, LDLDIRECT in the last 72 hours. Thyroid Function Tests: No results for input(s): TSH, T4TOTAL, FREET4, T3FREE, THYROIDAB in the last 72 hours. Anemia Panel: No results for input(s): VITAMINB12, FOLATE, FERRITIN, TIBC, IRON, RETICCTPCT in the last 72 hours. Urine analysis:    Component Value Date/Time   COLORURINE DARK YELLOW 02/10/2018 1537   APPEARANCEUR TURBID (A) 02/10/2018 1537   APPEARANCEUR Clear 12/07/2013 1843   LABSPEC 1.024 02/10/2018 1537   LABSPEC 1.003 12/07/2013 1843   LABSPEC 1.020 06/27/2006 1640   PHURINE 5.5 02/10/2018 1537   GLUCOSEU 2+ (A) 02/10/2018 1537   GLUCOSEU Negative 12/07/2013 1843   HGBUR NEGATIVE 02/10/2018 1537   BILIRUBINUR NEGATIVE 08/29/2015 1517   BILIRUBINUR Negative 12/07/2013 1843   BILIRUBINUR Negative 06/27/2006 1640   KETONESUR 1+ (A) 02/10/2018 1537   PROTEINUR TRACE (A) 02/10/2018 1537   UROBILINOGEN 0.2 08/26/2014 1759   NITRITE NEGATIVE 08/29/2015 1517   LEUKOCYTESUR NEGATIVE 08/29/2015 1517   LEUKOCYTESUR Negative 12/07/2013 1843   LEUKOCYTESUR Negative 06/27/2006 1640     Danyia Borunda M.D. [facility] 09/14/2018, 10:36 AM  Pager: [phone] Between 7am to 7pm - call Pager - [phone]  After 7pm go to [email or web address] - password [email or web address]  Call night coverage person covering after 7pm

## 2018-09-14 NOTE — Progress Notes (Signed)
Patient d/c, called and spoke with Saintclair Halsted (husband). Advised of d/c information and prescriptions that patient would be sent home with via [facility]. Called hospice nurse (Tammy) to set up PCA for patient.

## 2018-09-14 NOTE — Progress Notes (Signed)
Daily Progress Note   Patient Name: Suzanne [name]       Date: 09/14/2018 DOB: [birth year]  Age: 63 y.o. MRN#: [id number] Attending Physician: Suzanne Corning, MD Primary Care Physician: Suzanne Pinto, MD Admit Date: 09/09/2018  Reason for Consultation/Follow-up: Establishing goals of care and Pain control  Subjective: I met today with Ms. Suzanne [name] and her husband.  Continues to report pain is better controlled since starting PCA.  Working to transition home with hospice with PCA.  PICC placed and hospice has arranged for PCA.  Reported that other equipment delivery has been delayed. Plan to d/c once equipment in home and set up.  Length of Stay: 5  Current Medications: Scheduled Meds:  . dexamethasone  4 mg Intravenous Q12H  . enoxaparin (LOVENOX) injection  40 mg Subcutaneous Q24H  . fentaNYL  1 patch Transdermal Q72H  . HYDROmorphone   Intravenous Q4H  . ipratropium-albuterol  3 mL Nebulization TID  . loratadine  10 mg Oral Daily  . LORazepam  1-2 mg Oral QHS  . pantoprazole  40 mg Oral Daily  . senna  2 tablet Oral QHS  . sodium chloride flush  10-40 mL Intracatheter Q12H    Continuous Infusions: . sodium chloride 20 mL/hr at 09/14/18 0809    PRN Meds: sodium chloride, acetaminophen **OR** acetaminophen, albuterol, LORazepam, magnesium citrate, ondansetron (ZOFRAN) IV, prochlorperazine **OR** prochlorperazine **OR** prochlorperazine, promethazine, sodium chloride flush  Physical Exam    General: Sleepy, in no acute distress.  HEENT: No bruits, no goiter, no JVD Heart: Tachycardic. No murmur appreciated. Lungs: Good air movement, clear Abdomen: Soft, nontender, nondistended, positive bowel sounds.  Ext: No significant edema Skin: Warm and dry Neuro: Grossly intact,  nonfocal.       Vital Signs: BP (!) 148/107 (BP Location: Right Arm)   Pulse (!) 107   Temp 98 F (36.7 C) (Oral)   Resp 20   Ht 5' 2.5" (1.588 m)   Wt 73.6 kg   SpO2 99%   BMI 29.19 kg/m  SpO2: SpO2: 99 % O2 Device: O2 Device: Nasal Cannula O2 Flow Rate: O2 Flow Rate (L/min): 6 L/min  Intake/output summary:   Intake/Output Summary (Last 24 hours) at 09/14/2018 0902 Last data filed at 09/14/2018 0809 Gross per 24 hour  Intake 449.43 ml  Output -  Net  449.43 ml   LBM: Last BM Date: (PTA) Baseline Weight: Weight: 73.6 kg Most recent weight: Weight: 73.6 kg       Palliative Assessment/Data:    Flowsheet Rows     Most Recent Value  Intake Tab  Referral Department  Hospitalist  Unit at Time of Referral  Oncology Unit  Palliative Care Primary Diagnosis  Cancer  Date Notified  09/10/18  Palliative Care Type  New Palliative care  Reason for referral  Non-pain Symptom, Pain  Date of Admission  09/09/18  # of days IP prior to Palliative referral  1  Clinical Assessment  Psychosocial & Spiritual Assessment  Palliative Care Outcomes      Patient Active Problem List   Diagnosis Date Noted  . Cancer associated pain   . Compression fracture of thoracic vertebra ([place]) 09/09/2018  . Hypokalemia 09/09/2018  . Hypercalcemia 09/09/2018  . Metastatic breast cancer ([place]) 06/30/2018  . Insulin resistance 06/30/2018  . Vitamin D deficiency 06/30/2018  . Hyperlipidemia, mixed 06/30/2018  . Elevated BP without diagnosis of hypertension 06/30/2018  . Elevated serum glucose 02/09/2018  . Encounter for general adult medical examination with abnormal findings 12/15/2017  . Chronic hypoxemic respiratory failure ([place]) 05/29/2017  . Atelectasis of right lung 05/29/2017  . Genetic testing 05/31/2016  . Bone metastases ([place]) 05/07/2016  . Lung metastases ([place]) 05/07/2016  . COPD with asthma ([place]) 02/10/2015  . Prediabetes 08/26/2014  . Systolic dysfunction 16/05/9603  . GERD  (gastroesophageal reflux disease) 08/26/2014  . Overweight (BMI 25.0-29.9) 08/26/2014  . Lung nodules 08/26/2014  . Insomnia 08/26/2014  . RLS (restless legs syndrome) 08/26/2014  . Breast cancer of lower-outer quadrant of right female breast ([place]) 07/15/2012  . Essential hypertension   . Benzodiazepine dependence ([place]) 05/24/2012    Class: Chronic  . PVC (premature ventricular contraction) 11/29/2009  . Chronic cough 07/05/2008  . DYSPNEA 08/15/2007    Palliative Care Assessment & Plan   Patient Profile: 63 y.o. female  with past medical history of breast cancer mets to bone, lung, liver; ETOH abuse, depression, COPD, CAD, HTN, insomnia admitted on 09/09/2018 with intractable pain s/t bone mets.   Recommendations/Plan: Pain: Continue PCA on d/c. - Home with hospice today vs tomorrow.  Goals of Care and Additional Recommendations:  Limitations on Scope of Treatment: Avoid Hospitalization  Code Status:    Code Status Orders  (From admission, onward)         Start     Ordered   09/09/18 1833  Do not attempt resuscitation (DNR)  Continuous    Question Answer Comment  In the event of cardiac or respiratory ARREST Do not call a "code blue"   In the event of cardiac or respiratory ARREST Do not perform Intubation, CPR, defibrillation or ACLS   In the event of cardiac or respiratory ARREST Use medication by any route, position, wound care, and other measures to relive pain and suffering. May use oxygen, suction and manual treatment of airway obstruction as needed for comfort.      09/09/18 1836        Code Status History    Date Active Date Inactive Code Status Order ID Comments User Context   09/09/2018 1829 09/09/2018 1836 DNR [id number]  Suzanne Milan, MD Inpatient   05/21/2012 1949 05/22/2012 1904 Full Code [id number]  Suzanne Frames, MD ED    Advance Directive Documentation     Most Recent Value  Type of Advance Directive  Healthcare Power of [place] [pt  did not]    Pre-existing out of facility DNR order (yellow form or pink MOST form)  -  "MOST" Form in Place?  -       Prognosis:   < 3 months  Discharge Planning:  Home with Hospice  Care plan was discussed with patient, RN  Thank you for allowing the Palliative Medicine Team to assist in the care of this patient.   Total Time 20 Prolonged Time Billed No      Greater than 50%  of this time was spent counseling and coordinating care related to the above assessment and plan.  Micheline Rough, MD  Please contact Palliative Medicine Team phone at [phone] for questions and concerns.

## 2018-09-18 ENCOUNTER — Encounter (HOSPITAL_COMMUNITY): Payer: Self-pay

## 2018-09-18 ENCOUNTER — Other Ambulatory Visit: Payer: Self-pay

## 2018-09-18 ENCOUNTER — Emergency Department (HOSPITAL_COMMUNITY)
Admission: EM | Admit: 2018-09-18 | Discharge: 2018-09-19 | Disposition: A | Discharge status: Home / Self Care | Attending: Emergency Medicine | Admitting: Emergency Medicine

## 2018-09-18 ENCOUNTER — Emergency Department (HOSPITAL_COMMUNITY)

## 2018-09-18 DIAGNOSIS — T82898A Other specified complication of vascular prosthetic devices, implants and grafts, initial encounter: Secondary | ICD-10-CM | POA: Diagnosis not present

## 2018-09-18 DIAGNOSIS — I10 Essential (primary) hypertension: Secondary | ICD-10-CM | POA: Diagnosis not present

## 2018-09-18 DIAGNOSIS — C7800 Secondary malignant neoplasm of unspecified lung: Secondary | ICD-10-CM | POA: Diagnosis not present

## 2018-09-18 DIAGNOSIS — C799 Secondary malignant neoplasm of unspecified site: Secondary | ICD-10-CM

## 2018-09-18 DIAGNOSIS — Z452 Encounter for adjustment and management of vascular access device: Secondary | ICD-10-CM

## 2018-09-18 DIAGNOSIS — Z79899 Other long term (current) drug therapy: Secondary | ICD-10-CM | POA: Insufficient documentation

## 2018-09-18 DIAGNOSIS — J90 Pleural effusion, not elsewhere classified: Secondary | ICD-10-CM | POA: Diagnosis not present

## 2018-09-18 DIAGNOSIS — T82598A Other mechanical complication of other cardiac and vascular devices and implants, initial encounter: Secondary | ICD-10-CM | POA: Insufficient documentation

## 2018-09-18 DIAGNOSIS — F329 Major depressive disorder, single episode, unspecified: Secondary | ICD-10-CM | POA: Diagnosis not present

## 2018-09-18 DIAGNOSIS — Y828 Other medical devices associated with adverse incidents: Secondary | ICD-10-CM | POA: Diagnosis not present

## 2018-09-18 DIAGNOSIS — Z853 Personal history of malignant neoplasm of breast: Secondary | ICD-10-CM | POA: Diagnosis not present

## 2018-09-18 DIAGNOSIS — C7951 Secondary malignant neoplasm of bone: Secondary | ICD-10-CM | POA: Insufficient documentation

## 2018-09-18 LAB — BASIC METABOLIC PANEL
Anion gap: 11 (ref 5–15)
BUN: 18 mg/dL (ref 8–23)
CO2: 32 mmol/L (ref 22–32)
Calcium: 9.3 mg/dL (ref 8.9–10.3)
Chloride: 91 mmol/L — ABNORMAL LOW (ref 98–111)
Creatinine, Ser: 0.63 mg/dL (ref 0.44–1.00)
GFR calc Af Amer: >60 mL/min (ref >60)
GFR calc non Af Amer: >60 mL/min (ref >60)
Glucose, Bld: 192 mg/dL — ABNORMAL HIGH (ref 70–99)
Potassium: 4.5 mmol/L (ref 3.5–5.1)
Sodium: 134 mmol/L — ABNORMAL LOW (ref 135–145)

## 2018-09-18 LAB — CBC
HCT: 41.7 % (ref 36.0–46.0)
Hemoglobin: 13.8 g/dL (ref 12.0–15.0)
MCH: 37.4 pg — ABNORMAL HIGH (ref 26.0–34.0)
MCHC: 33.1 g/dL (ref 30.0–36.0)
MCV: 113 fL — ABNORMAL HIGH (ref 80.0–100.0)
Platelets: 162 10*3/uL (ref 150–400)
RBC: 3.69 MIL/uL — ABNORMAL LOW (ref 3.87–5.11)
RDW: 15.4 % (ref 11.5–15.5)
WBC: 14.9 10*3/uL — AB (ref 4.0–10.5)
nRBC: 0 % (ref 0.0–0.2)

## 2018-09-18 MED ORDER — SODIUM CHLORIDE 0.9% FLUSH: 9.0000 mL | INTRAVENOUS | Status: DC | PRN

## 2018-09-18 MED ORDER — DIPHENHYDRAMINE HCL 12.5 MG/5ML PO ELIX: 12.5000 mg | ORAL_SOLUTION | Freq: Four times a day (QID) | ORAL | Status: DC | PRN

## 2018-09-18 MED ORDER — HYDROMORPHONE HCL 1 MG/ML IJ SOLN: 1.0000 mg | Freq: Once | INTRAMUSCULAR | Status: DC

## 2018-09-18 MED ORDER — HYDROMORPHONE 1 MG/ML IV SOLN
INTRAVENOUS | Status: DC
  Administered 2018-09-18 – 2018-09-19 (×2): 30 mg via INTRAVENOUS
  Filled 2018-09-18: qty 30

## 2018-09-18 MED ORDER — ONDANSETRON HCL 4 MG/2ML IJ SOLN: 4.0000 mg | Freq: Four times a day (QID) | INTRAMUSCULAR | Status: DC | PRN

## 2018-09-18 MED ORDER — NALOXONE HCL 0.4 MG/ML IJ SOLN: 0.4000 mg | INTRAMUSCULAR | Status: DC | PRN

## 2018-09-18 MED ORDER — DIPHENHYDRAMINE HCL 50 MG/ML IJ SOLN: 12.5000 mg | Freq: Four times a day (QID) | INTRAMUSCULAR | Status: DC | PRN

## 2018-09-18 NOTE — ED Triage Notes (Signed)
Pt reports that she noticed that her PICC line was cut when she got back from the bathroom. She states that it is broken at the insertion site. She has been off of her morphine drip for 2 hours and states that her pain is starting to increase. A&Ox4.

## 2018-09-18 NOTE — ED Provider Notes (Signed)
[facility] DEPT Provider Note   CSN: [id number] Arrival date & time: 09/18/18  2047     History   Chief Complaint Chief Complaint  Patient presents with  . Vascular Access Problem    HPI Suzanne [name] is a 63 y.o. female.  HPI Patient presents after her left upper extremity PICC got pulled out.  Patient states she thought it was cut but it was not anything there to cut it.  There is nothing coming out of the skin.  States she is unsure if there is something in the skin.  No change in her chronic trouble breathing.  She is on Dilaudid PCA with a 5 mg basal rate and 1 mg boluses as needed every 10 minutes.  She gets seen at hospice and is on home hospice for her metastatic cancer. Past Medical History:  Diagnosis Date  . Alcoholism ([place])   . Cancer [facility])    breast ca - 1994; recurred in 2007. s/p masectomy with flap rconstruction aand chemo '94. local recurrence on chest wall. chemo and XRT '07. now femara  . Depression   . Dyspnea    myoview 2011: EF 55% question of  mild reverible anterior defect. thought to be breast attenuation. echo 45-50% with global HK. Grade 1 diastolyic dysfunction. RV nml. cardiac MRI with EF 44% with septal HK. no scar   . History of coronary artery stent placement   . Hyperlipidemia   . Hypertension   . Insomnia   . Lung disorder     Patient Active Problem List   Diagnosis Date Noted  . Cancer associated pain   . Compression fracture of thoracic vertebra ([place]) 09/09/2018  . Hypokalemia 09/09/2018  . Hypercalcemia 09/09/2018  . Metastatic breast cancer ([place]) 06/30/2018  . Insulin resistance 06/30/2018  . Vitamin D deficiency 06/30/2018  . Hyperlipidemia, mixed 06/30/2018  . Elevated BP without diagnosis of hypertension 06/30/2018  . Elevated serum glucose 02/09/2018  . Encounter for general adult medical examination with abnormal findings 12/15/2017  . Chronic hypoxemic respiratory failure ([place]) 05/29/2017  .  Atelectasis of right lung 05/29/2017  . Genetic testing 05/31/2016  . Bone metastases ([place]) 05/07/2016  . Lung metastases ([place]) 05/07/2016  . 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 Prediabetes 08/26/2014  . Systolic dysfunction 78/29/5621  . GERD (gastroesophageal reflux disease) 08/26/2014  . Overweight (BMI 25.0-29.9) 08/26/2014  . Lung nodules 08/26/2014  . Insomnia 08/26/2014  . RLS (restless legs syndrome) 08/26/2014  . Breast cancer of lower-outer quadrant of right female breast ([place]) 07/15/2012  . Essential hypertension   . Benzodiazepine dependence ([place]) 05/24/2012    Class: Chronic  . PVC (premature ventricular contraction) 11/29/2009  . Chronic cough 07/05/2008  . DYSPNEA 08/15/2007    Past Surgical History:  Procedure Laterality Date  . CARDIAC CATHETERIZATION     [facility]  . MASTECTOMY    . ORIF DISTAL RADIUS FRACTURE    . PORT-A-CATH REMOVAL  07/29/2012   Procedure: REMOVAL PORT-A-CATH;  Surgeon: Haywood Lasso, MD;  Location: [facility];  Service: General;  Laterality: Left;  . R masectomy  '94   . R transflap  '94     OB History   No obstetric history on file.      Home Medications    Prior to Admission medications   Medication Sig Start Date End Date Taking? Authorizing Provider  acetaminophen (TYLENOL) 325 MG tablet Take 2 tablets (650 mg total) by mouth every 6 (six)  hours as needed for mild pain (over the counter). 09/13/18  Yes Rai, Ripudeep K, MD  albuterol (PROAIR HFA) 108 (90 Base) MCG/ACT inhaler INHALE 1 PUFF BY MOUTH EVERY 6 HOURS AS NEEDED FOR WHEEZING OR SHORTNESS OF BREATH Patient taking differently: Inhale 1 puff into the lungs every 6 (six) hours as needed for shortness of breath.  01/30/18  Yes Nicholas Lose, MD  albuterol (PROVENTIL) (2.5 MG/3ML) 0.083% nebulizer solution INHALE 1 VIAL VIA NEBULIZER EVERY 6 HOURS AS NEEDED FOR WHEEZING OR SHORTNESS OF BREATH Patient taking differently: Take 2.5 mg by nebulization  every 6 (six) hours as needed for wheezing or shortness of breath.  09/09/17  Yes Nicholas Lose, MD  calcium carbonate (OS-CAL - DOSED IN MG OF ELEMENTAL CALCIUM) 1250 (500 Ca) MG tablet Take 1 tablet by mouth.   Yes [provider]  cholecalciferol (VITAMIN D) 1000 units tablet Take 1,000 Units by mouth daily.   Yes [provider]  dexamethasone (DECADRON) 4 MG tablet Take 1 tablet (4 mg total) by mouth 2 (two) times daily with a meal. 09/13/18  Yes Rai, Ripudeep K, MD  fentaNYL (DURAGESIC) 50 MCG/HR Place 1 patch onto the skin every 3 (three) days. 09/13/18  Yes Rai, Ripudeep K, MD  HYDROmorphone (DILAUDID) 1 mg/mL injection Inject 1 mL (1 mg total) into the vein every 4 (four) hours. 09/13/18  Yes Rai, Ripudeep K, MD  ibuprofen (ADVIL,MOTRIN) 200 MG tablet Take 200 mg by mouth every 6 (six) hours as needed.   Yes [provider]  loratadine (CLARITIN) 10 MG tablet Take 1 tablet (10 mg total) by mouth daily. 06/06/17  Yes Collene Gobble, MD  LORazepam (ATIVAN) 2 MG tablet Take 0.5-1 tablets (1-2 mg total) by mouth every 6 (six) hours as needed for anxiety. 09/13/18  Yes Rai, Ripudeep K, MD  ondansetron (ZOFRAN) 8 MG tablet Take 1 tablet (8 mg total) by mouth every 8 (eight) hours as needed for nausea or vomiting. 09/13/18  Yes Rai, Ripudeep K, MD  pantoprazole (PROTONIX) 40 MG tablet Take 1 tablet (40 mg total) by mouth daily. 09/14/18  Yes Rai, Ripudeep K, MD  potassium chloride SA (K-DUR,KLOR-CON) 20 MEQ tablet Take 1 tablet twice daily. 03/27/18  Yes Unk Pinto, MD  prochlorperazine (COMPAZINE) 5 MG tablet Take 1 tablet (5 mg total) by mouth every 6 (six) hours as needed for nausea or refractory nausea / vomiting. 09/13/18  Yes Rai, Ripudeep K, MD  promethazine (PHENERGAN) 25 MG tablet TAKE 1 TABLET BY MOUTH EVERY 6 HOURS AS NEEDED FOR NAUSEA. Patient taking differently: Take 25 mg by mouth every 6 (six) hours as needed for nausea.  08/25/18  Yes Nicholas Lose, MD  senna  (SENOKOT) 8.6 MG TABS tablet Take 2 tablets (17.2 mg total) by mouth at bedtime. 09/13/18  Yes Rai, Ripudeep K, MD  TRELEGY ELLIPTA 100-62.5-25 MCG/INH AEPB INHALE 1 PUFF INTO THE LUNGS DAILY. Patient taking differently: Inhale 1 puff into the lungs daily.  06/26/18  Yes Collene Gobble, MD  blood glucose meter kit and supplies KIT Dispense based on patient and insurance preference. Use up to four times daily as directed. (FOR ICD-9 250.00, 250.01). 02/05/18   Nicholas Lose, MD    Family History Family History  Problem Relation Age of Onset  . Hypertension Mother   . Heart attack Father   . Alcohol abuse Father   . Coronary artery disease Other        family hx  . Hyperlipidemia Other  family hx  . Thyroid disease Other        family hx   . Alcohol abuse Paternal Grandfather   . Alcohol abuse Paternal Uncle   . Cancer Paternal Grandmother        breast  . Alcohol abuse Brother     Social History Social History   Tobacco Use  . Smoking status: Never Smoker  . Smokeless tobacco: Never Used  . Tobacco comment: just socially   Substance Use Topics  . Alcohol use: Yes    Comment: No alcohol since 05/22/2012  . Drug use: No     Allergies   Ace inhibitors   Review of Systems Review of Systems  Constitutional: Negative for fever.  Respiratory: Positive for shortness of breath.   Cardiovascular: Negative for chest pain.  Gastrointestinal: Negative for abdominal pain.  Genitourinary: Negative for flank pain.  Musculoskeletal: Positive for back pain.  Skin: Negative for rash.  Neurological: Positive for weakness.  Psychiatric/Behavioral: Negative for confusion.     Physical Exam Updated Vital Signs BP (!) 147/97 (BP Location: Right Arm)   Pulse (!) 117   Temp 97.9 F (36.6 C) (Oral)   Resp 18 Comment: Simultaneous filing. User may not have seen previous data.  SpO2 96% Comment: Simultaneous filing. User may not have seen previous data.  Physical  Exam Constitutional:      Appearance: She is ill-appearing.  HENT:     Head: Atraumatic.     Mouth/Throat:     Mouth: Mucous membranes are moist.  Cardiovascular:     Comments: Mild tachycardia Pulmonary:     Breath sounds: No wheezing or rhonchi.  Abdominal:     Tenderness: There is no abdominal tenderness.  Musculoskeletal:     Comments: Left upper extremity with dressing at site of previous PICC line.   Skin:    General: Skin is warm.     Capillary Refill: Capillary refill takes less than 2 seconds.  Neurological:     Mental Status: She is alert.      ED Treatments / Results  Labs (all labs ordered are listed, but only abnormal results are displayed) Labs Reviewed  BASIC METABOLIC PANEL - Abnormal; Notable for the following components:      Result Value   Sodium 134 (*)    Chloride 91 (*)    Glucose, Bld 192 (*)    All other components within normal limits  CBC - Abnormal; Notable for the following components:   WBC 14.9 (*)    RBC 3.69 (*)    MCV 113.0 (*)    MCH 37.4 (*)    All other components within normal limits    EKG None  Radiology Dg Chest Portable 1 View  Result Date: 09/18/2018 CLINICAL DATA:  Evaluate broken PICC line. EXAM: PORTABLE CHEST 1 VIEW COMPARISON:  Chest radiograph September 05, 2018 and CT chest September 09, 2018 FINDINGS: No PICC line catheter fragments identified. Persistent moderate to large RIGHT pleural effusion with underlying consolidation. Strandy densities LEFT mid and lower lung zone. Cardiac silhouette similarly displaced to the LEFT, not definitely enlarged. Mediastinal silhouette is not suspicious. Surgical clips project RIGHT chest and mid abdomen. IMPRESSION: 1. No retained catheter fragments. 2. Similar moderate to large RIGHT pleural effusion with underlying consolidation. LEFT mid and lower lung zone atelectasis/scarring. Electronically Signed   By: Elon Alas M.D.   On: 09/18/2018 22:16    Procedures Procedures  (including critical care time)  Medications Ordered in ED Medications  HYDROmorphone (DILAUDID) injection 1 mg (has no administration in time range)  naloxone [facility]) injection 0.4 mg (has no administration in time range)    And  sodium chloride flush (NS) 0.9 % injection 9 mL (has no administration in time range)  ondansetron (ZOFRAN) injection 4 mg (has no administration in time range)  diphenhydrAMINE (BENADRYL) injection 12.5 mg (has no administration in time range)    Or  diphenhydrAMINE (BENADRYL) 12.5 MG/5ML elixir 12.5 mg (has no administration in time range)  HYDROmorphone (DILAUDID) 1 mg/mL PCA injection (30 mg Intravenous Incomplete 09/18/18 2232)     Initial Impression / Assessment and Plan / ED Course  I have reviewed the triage vital signs and the nursing notes.  Pertinent labs & imaging results that were available during my care of the patient were reviewed by me and considered in my medical decision making (see chart for details).  Clinical Course as of Sep 18 2314  Thu Sep 18, 2018  2251 Home hospice, home pca pulled out, giving her a pca overnight, IV team in the morning to replace PICC   [MB]    Clinical Course User Index [MB] Maudie Flakes, MD    Patient with PICC line for pain for metastatic cancer.  On home hospice.  Not able to control pain at home.  Not able to get PICC line placed right now in the ER.  Discussed with hospitalist about possible admission but we have discussed and feel overnight in the ER be best and get PICC line placed in the morning and then to be transferred home.  Started on PCA at home dose but the bedside machine will not let us go up to her home dose of up to 6 mg/h.  Now will lock her out at 3 mg an hour.  Can be given further IV boluses if needed if pain is uncontrolled on the PCA.  Final Clinical Impressions(s) / ED Diagnoses   Final diagnoses:  Metastatic cancer [facility])    ED Discharge Orders    None       Davonna Belling, MD 09/18/18 2316

## 2018-09-19 ENCOUNTER — Emergency Department: Payer: Self-pay

## 2018-09-19 DIAGNOSIS — J449 Chronic obstructive pulmonary disease, unspecified: Secondary | ICD-10-CM | POA: Diagnosis not present

## 2018-09-19 NOTE — Progress Notes (Signed)
Peripherally Inserted Central Catheter/Midline Placement  The IV Nurse has discussed with the patient and/or persons authorized to consent for the patient, the purpose of this procedure and the potential benefits and risks involved with this procedure.  The benefits include less needle sticks, lab draws from the catheter, and the patient may be discharged home with the catheter. Risks include, but not limited to, infection, bleeding, blood clot (thrombus formation), and puncture of an artery; nerve damage and irregular heartbeat and possibility to perform a PICC exchange if needed/ordered by physician.  Alternatives to this procedure were also discussed.  [facility] Power PICC patient education guide, fact sheet on infection prevention and patient information card has been provided to patient /or left at bedside.    PICC/Midline Placement Documentation  PICC Single Lumen 55/37/48 PICC Left Cephalic 43 cm 0 cm (Active)  Indication for Insertion or Continuance of Line Home intravenous therapies (PICC only) 09/19/2018  9:45 AM  Exposed Catheter (cm) 0 cm 09/19/2018  9:45 AM  Site Assessment Clean;Dry;Intact 09/19/2018  9:45 AM  Line Status Flushed;Saline locked;Blood return noted 09/19/2018  9:45 AM  Dressing Type Transparent;Securing device 09/19/2018  9:45 AM  Dressing Status Clean;Dry;Intact;Antimicrobial disc in place 09/19/2018  9:45 AM  Line Care Connections checked and tightened 09/19/2018  9:45 AM  Dressing Intervention New dressing 09/19/2018  9:45 AM  Dressing Change Due 09/26/18 09/19/2018  9:45 AM       Virgilio Belling 09/19/2018, 10:11 AM

## 2018-09-19 NOTE — Progress Notes (Signed)
[facility]:  ED visit made to re-hook up pt to her PCA pump from home so that she will be discharged. She has her oxygen with her as well. Her boyfriend/significant other who is her primary care giver is present and will take pt back home by car. The pt is alert oriented. Accidentally pulled PICC line out and it was replaced. Webb Silversmith RN [phone]

## 2018-09-19 NOTE — ED Notes (Signed)
IV team at bedside for PICC placement. 

## 2018-09-19 NOTE — ED Provider Notes (Signed)
8:24 AM Assumed care from Dr. Sedonia Small, please see their note for full history, physical and decision making until this point. In brief this is a 63 y.o. year old female who presented to the ED tonight with Vascular Access Problem     Awaiting PICC line then can be discharged.   PICC placed. [place] RN here to start home PCA. Husband to transport. Stable for dc.   Discharge instructions, including strict return precautions for new or worsening symptoms, given. Patient and/or family verbalized understanding and agreement with the plan as described.   Labs, studies and imaging reviewed by myself and considered in medical decision making if ordered. Imaging interpreted by radiology.  Labs Reviewed  BASIC METABOLIC PANEL - Abnormal; Notable for the following components:      Result Value   Sodium 134 (*)    Chloride 91 (*)    Glucose, Bld 192 (*)    All other components within normal limits  CBC - Abnormal; Notable for the following components:   WBC 14.9 (*)    RBC 3.69 (*)    MCV 113.0 (*)    MCH 37.4 (*)    All other components within normal limits    [place] EKG SITE RITE  Final Result    DG Chest Portable 1 View  Final Result      No follow-ups on file.    Merrily Pew, MD 09/19/18 1056

## 2018-09-19 NOTE — ED Notes (Signed)
D/c paperwork reviewed with pt and spouse.  Home health RN present at discharge and helped pt to hook up personal PCA pump to newly placed PICC.  Husband expressed desire to transport pt home and had personal oxygen supply present.

## 2018-09-19 NOTE — ED Notes (Addendum)
22.3 mL dilaudid wasted into hazardous waste receptacle with 2nd RN, A Evans, present. Unable to waste under pt name in pyxis.

## 2018-10-12 DEATH — deceased

## 2018-10-15 ENCOUNTER — Ambulatory Visit: Payer: Self-pay | Admitting: Internal Medicine

## 2018-10-31 ENCOUNTER — Encounter: Payer: Self-pay | Admitting: Hematology and Oncology

## 2018-12-18 ENCOUNTER — Encounter: Payer: Self-pay | Admitting: Adult Health

## 2018-12-30 ENCOUNTER — Ambulatory Visit: Payer: Self-pay | Admitting: Adult Health Nurse Practitioner

## 2018-12-30 ENCOUNTER — Ambulatory Visit: Payer: Self-pay | Admitting: Adult Health
